# Patient Record
Sex: Male | Born: 1960 | Race: White | Hispanic: Yes | Smoking: Current every day smoker
Health system: Southern US, Community
[De-identification: ages and names within clinical notes are randomized; demographics above are authoritative.]

## PROBLEM LIST (undated history)

## (undated) DIAGNOSIS — E781 Pure hyperglyceridemia: Secondary | ICD-10-CM

## (undated) DIAGNOSIS — K219 Gastro-esophageal reflux disease without esophagitis: Secondary | ICD-10-CM

## (undated) DIAGNOSIS — I10 Essential (primary) hypertension: Secondary | ICD-10-CM

## (undated) DIAGNOSIS — N400 Enlarged prostate without lower urinary tract symptoms: Secondary | ICD-10-CM

## (undated) HISTORY — DX: Pure hyperglyceridemia: E78.1

## (undated) HISTORY — DX: Essential (primary) hypertension: I10

## (undated) HISTORY — PX: FINGER SURGERY: SHX640

---

## 2008-08-18 ENCOUNTER — Emergency Department (HOSPITAL_COMMUNITY): Admission: EM | Admit: 2008-08-18 | Discharge: 2008-08-18 | Payer: Self-pay | Admitting: Emergency Medicine

## 2008-12-10 ENCOUNTER — Emergency Department (HOSPITAL_COMMUNITY): Admission: EM | Admit: 2008-12-10 | Discharge: 2008-12-10 | Payer: Self-pay | Admitting: Emergency Medicine

## 2009-04-22 ENCOUNTER — Encounter: Admission: RE | Admit: 2009-04-22 | Discharge: 2009-04-22 | Payer: Self-pay | Admitting: Chiropractor

## 2009-09-24 ENCOUNTER — Emergency Department (HOSPITAL_COMMUNITY): Admission: EM | Admit: 2009-09-24 | Discharge: 2009-09-24 | Payer: Self-pay | Admitting: Emergency Medicine

## 2010-05-20 LAB — URINALYSIS, ROUTINE W REFLEX MICROSCOPIC
Bilirubin Urine: NEGATIVE
Nitrite: NEGATIVE
Specific Gravity, Urine: 1.022 (ref 1.005–1.030)
pH: 6 (ref 5.0–8.0)

## 2010-05-20 LAB — COMPREHENSIVE METABOLIC PANEL
AST: 21 U/L (ref 0–37)
BUN: 14 mg/dL (ref 6–23)
CO2: 24 mEq/L (ref 19–32)
Calcium: 9.2 mg/dL (ref 8.4–10.5)
Creatinine, Ser: 0.78 mg/dL (ref 0.4–1.5)
Glucose, Bld: 121 mg/dL — ABNORMAL HIGH (ref 70–99)
Potassium: 4 mEq/L (ref 3.5–5.1)
Sodium: 139 mEq/L (ref 135–145)

## 2010-05-20 LAB — OVA AND PARASITE EXAMINATION

## 2010-05-20 LAB — DIFFERENTIAL
Lymphocytes Relative: 39 % (ref 12–46)
Lymphs Abs: 3.8 10*3/uL (ref 0.7–4.0)
Monocytes Absolute: 1 10*3/uL (ref 0.1–1.0)
Monocytes Relative: 11 % (ref 3–12)
Neutro Abs: 4.6 10*3/uL (ref 1.7–7.7)
Neutrophils Relative %: 47 % (ref 43–77)

## 2010-05-20 LAB — CLOSTRIDIUM DIFFICILE EIA: C difficile Toxins A+B, EIA: NEGATIVE

## 2010-05-20 LAB — CBC
Hemoglobin: 15.8 g/dL (ref 13.0–17.0)
MCHC: 34.6 g/dL (ref 30.0–36.0)
RBC: 5.07 MIL/uL (ref 4.22–5.81)
WBC: 9.8 10*3/uL (ref 4.0–10.5)

## 2010-05-20 LAB — STOOL CULTURE

## 2010-06-08 LAB — COMPREHENSIVE METABOLIC PANEL
ALT: 51 U/L (ref 0–53)
AST: 28 U/L (ref 0–37)
Alkaline Phosphatase: 124 U/L — ABNORMAL HIGH (ref 39–117)
GFR calc non Af Amer: >60 mL/min (ref >60)
Potassium: 3.4 mEq/L — ABNORMAL LOW (ref 3.5–5.1)
Sodium: 136 mEq/L (ref 135–145)

## 2010-06-08 LAB — URINALYSIS, ROUTINE W REFLEX MICROSCOPIC
Bilirubin Urine: NEGATIVE
Hgb urine dipstick: NEGATIVE
Ketones, ur: NEGATIVE mg/dL
Protein, ur: NEGATIVE mg/dL
Urobilinogen, UA: 0.2 mg/dL (ref 0.0–1.0)
pH: 7 (ref 5.0–8.0)

## 2010-06-08 LAB — CBC
HCT: 46 % (ref 39.0–52.0)
MCHC: 34.9 g/dL (ref 30.0–36.0)
MCV: 88.9 fL (ref 78.0–100.0)

## 2010-06-08 LAB — GLUCOSE, CAPILLARY
Glucose-Capillary: 236 mg/dL — ABNORMAL HIGH (ref 70–99)
Glucose-Capillary: 241 mg/dL — ABNORMAL HIGH (ref 70–99)
Glucose-Capillary: 250 mg/dL — ABNORMAL HIGH (ref 70–99)
Glucose-Capillary: 465 mg/dL — ABNORMAL HIGH (ref 70–99)

## 2010-06-08 LAB — DIFFERENTIAL
Eosinophils Relative: 2 % (ref 0–5)
Lymphs Abs: 2.5 10*3/uL (ref 0.7–4.0)
Monocytes Absolute: 0.7 10*3/uL (ref 0.1–1.0)
Neutro Abs: 5.1 10*3/uL (ref 1.7–7.7)
Neutrophils Relative %: 60 % (ref 43–77)

## 2010-06-08 LAB — URINE MICROSCOPIC-ADD ON

## 2011-02-14 IMAGING — CR DG HAND COMPLETE 3+V*R*
3 series · 3 of 3 positions shown · non-contrast
Comparison: None

CLINICAL DATA: Laceration near metacarpal phalangeal joint of the
right index finger.

RIGHT HAND - COMPLETE 3+ VIEW

[view not recorded (1 of 3)]
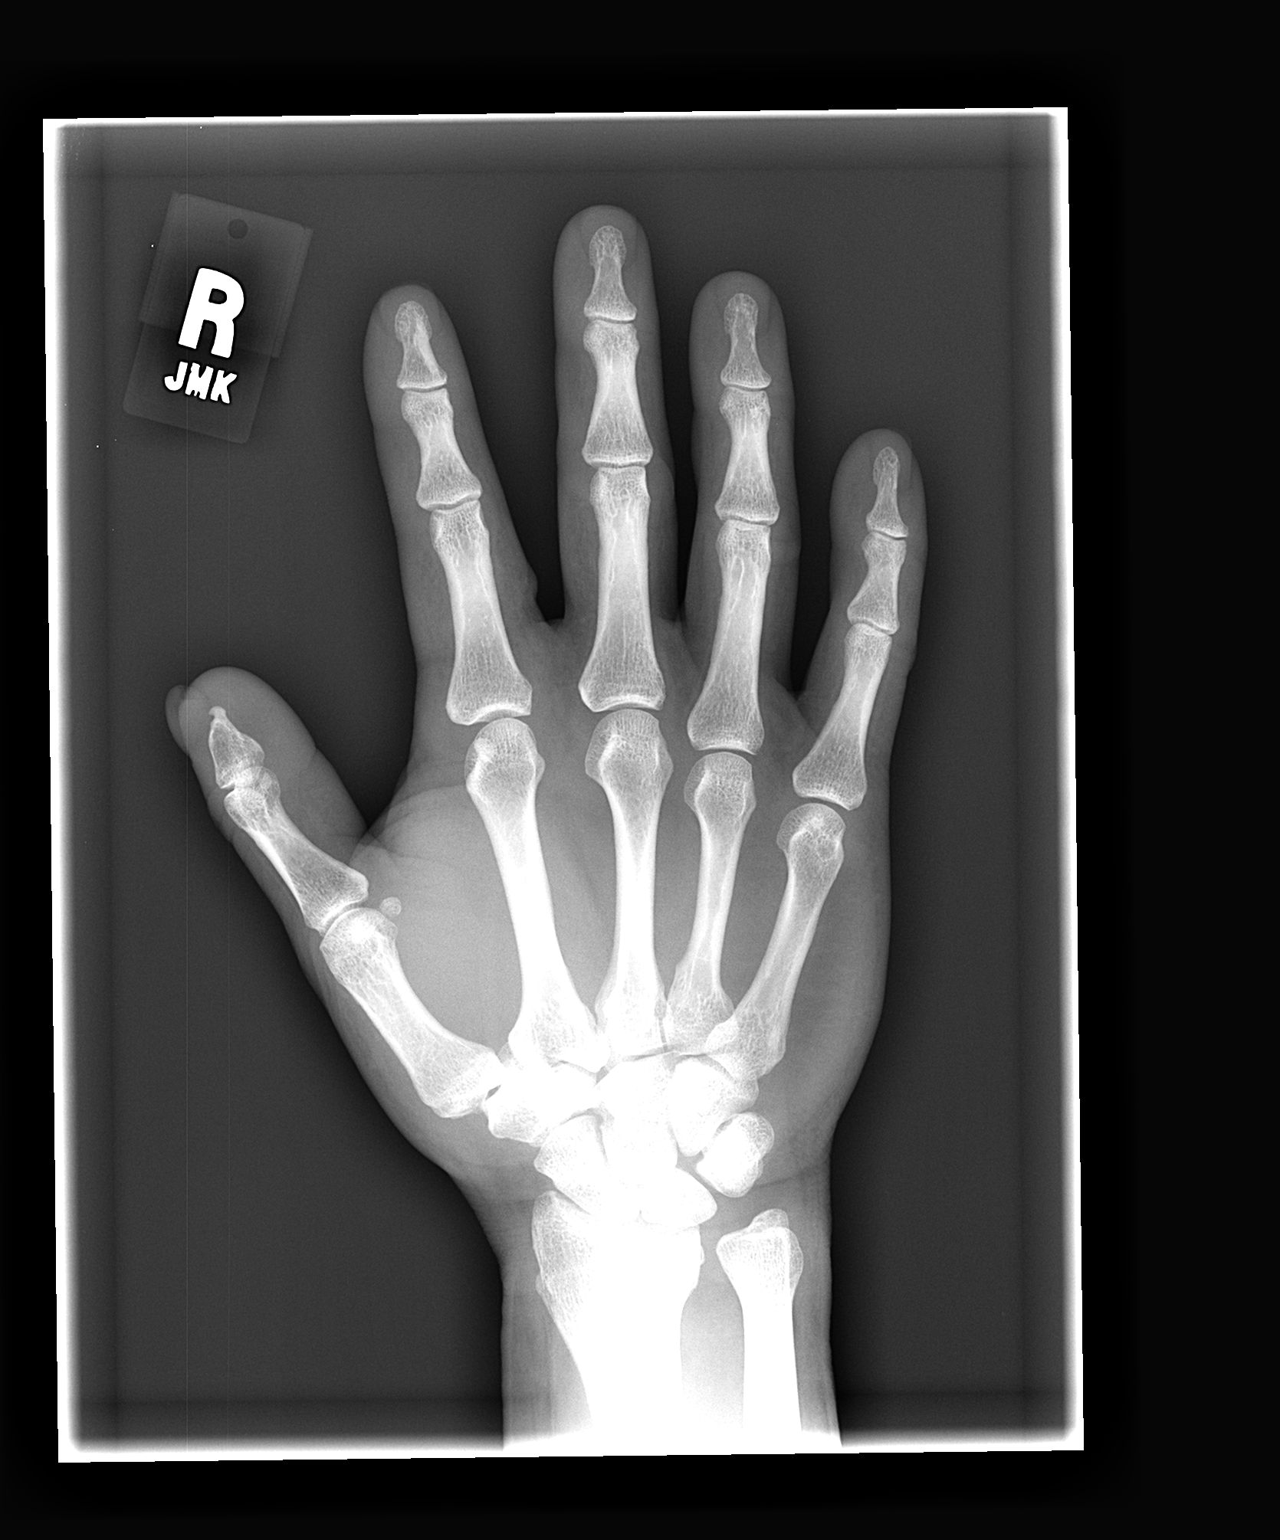

[view not recorded (2 of 3)]
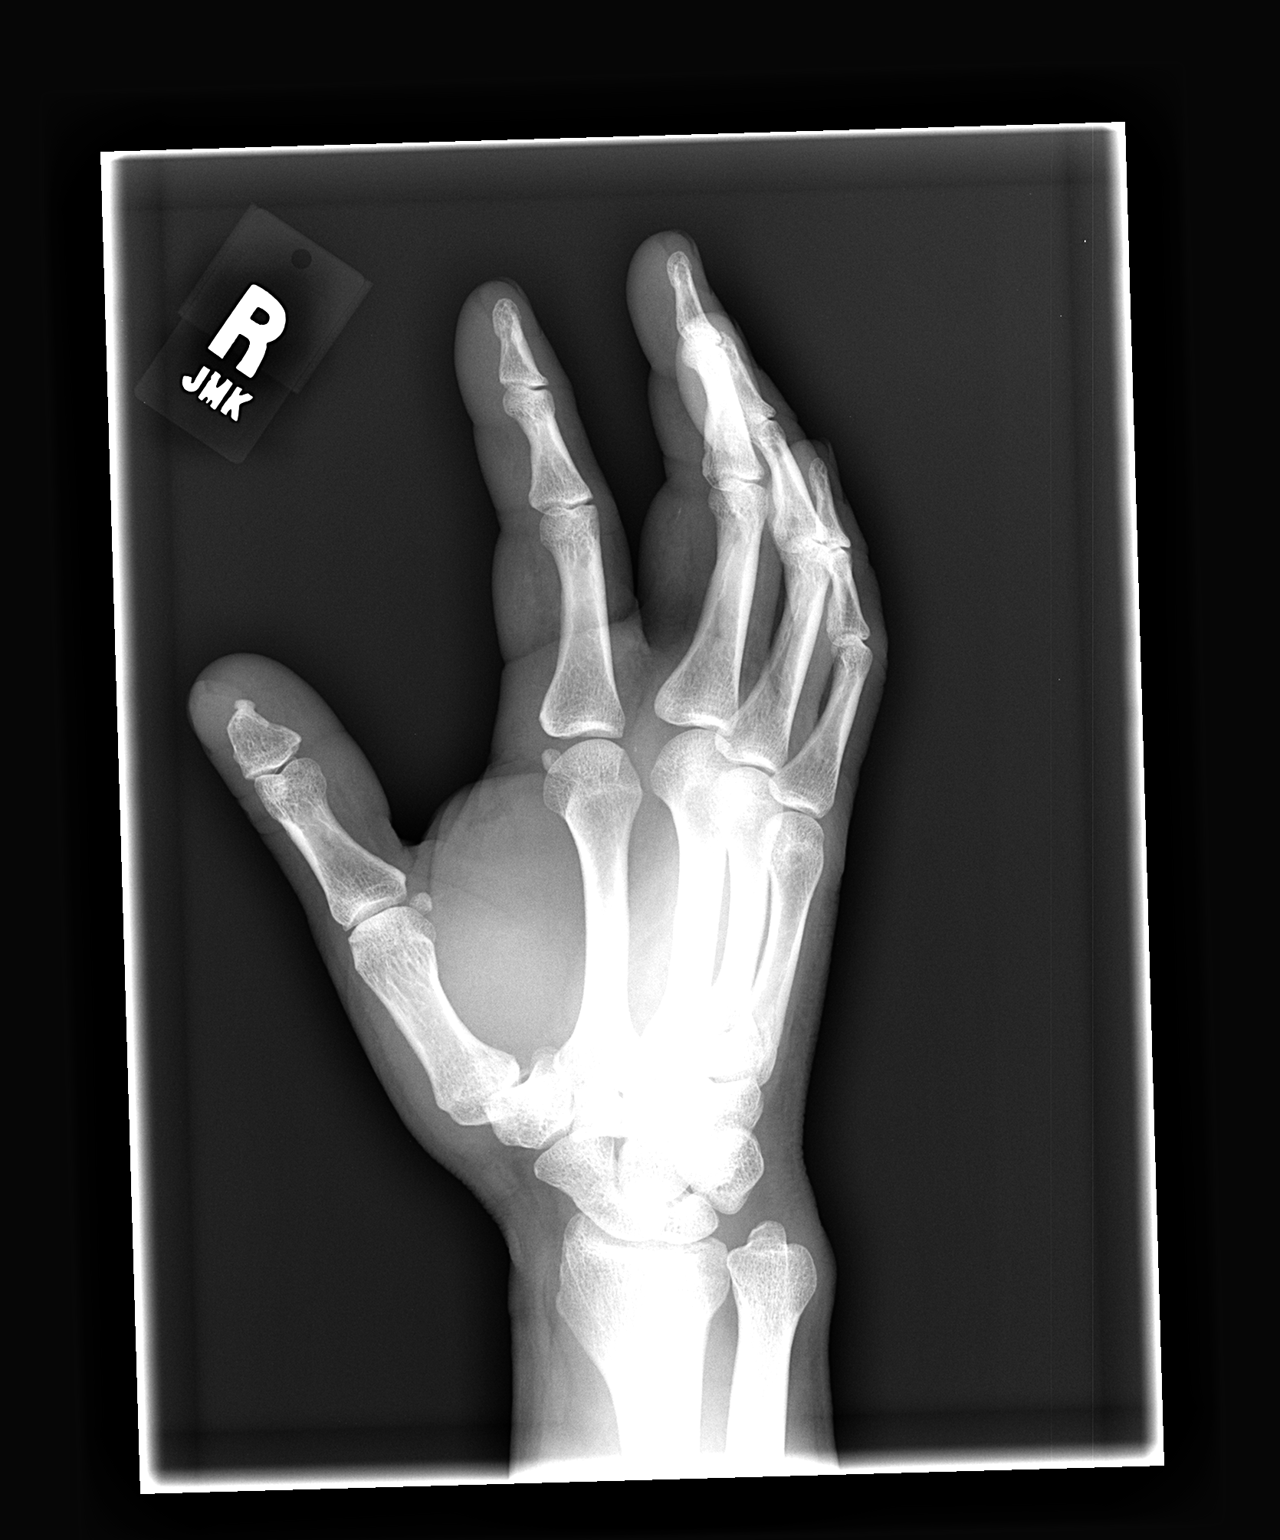

[view not recorded (3 of 3)]
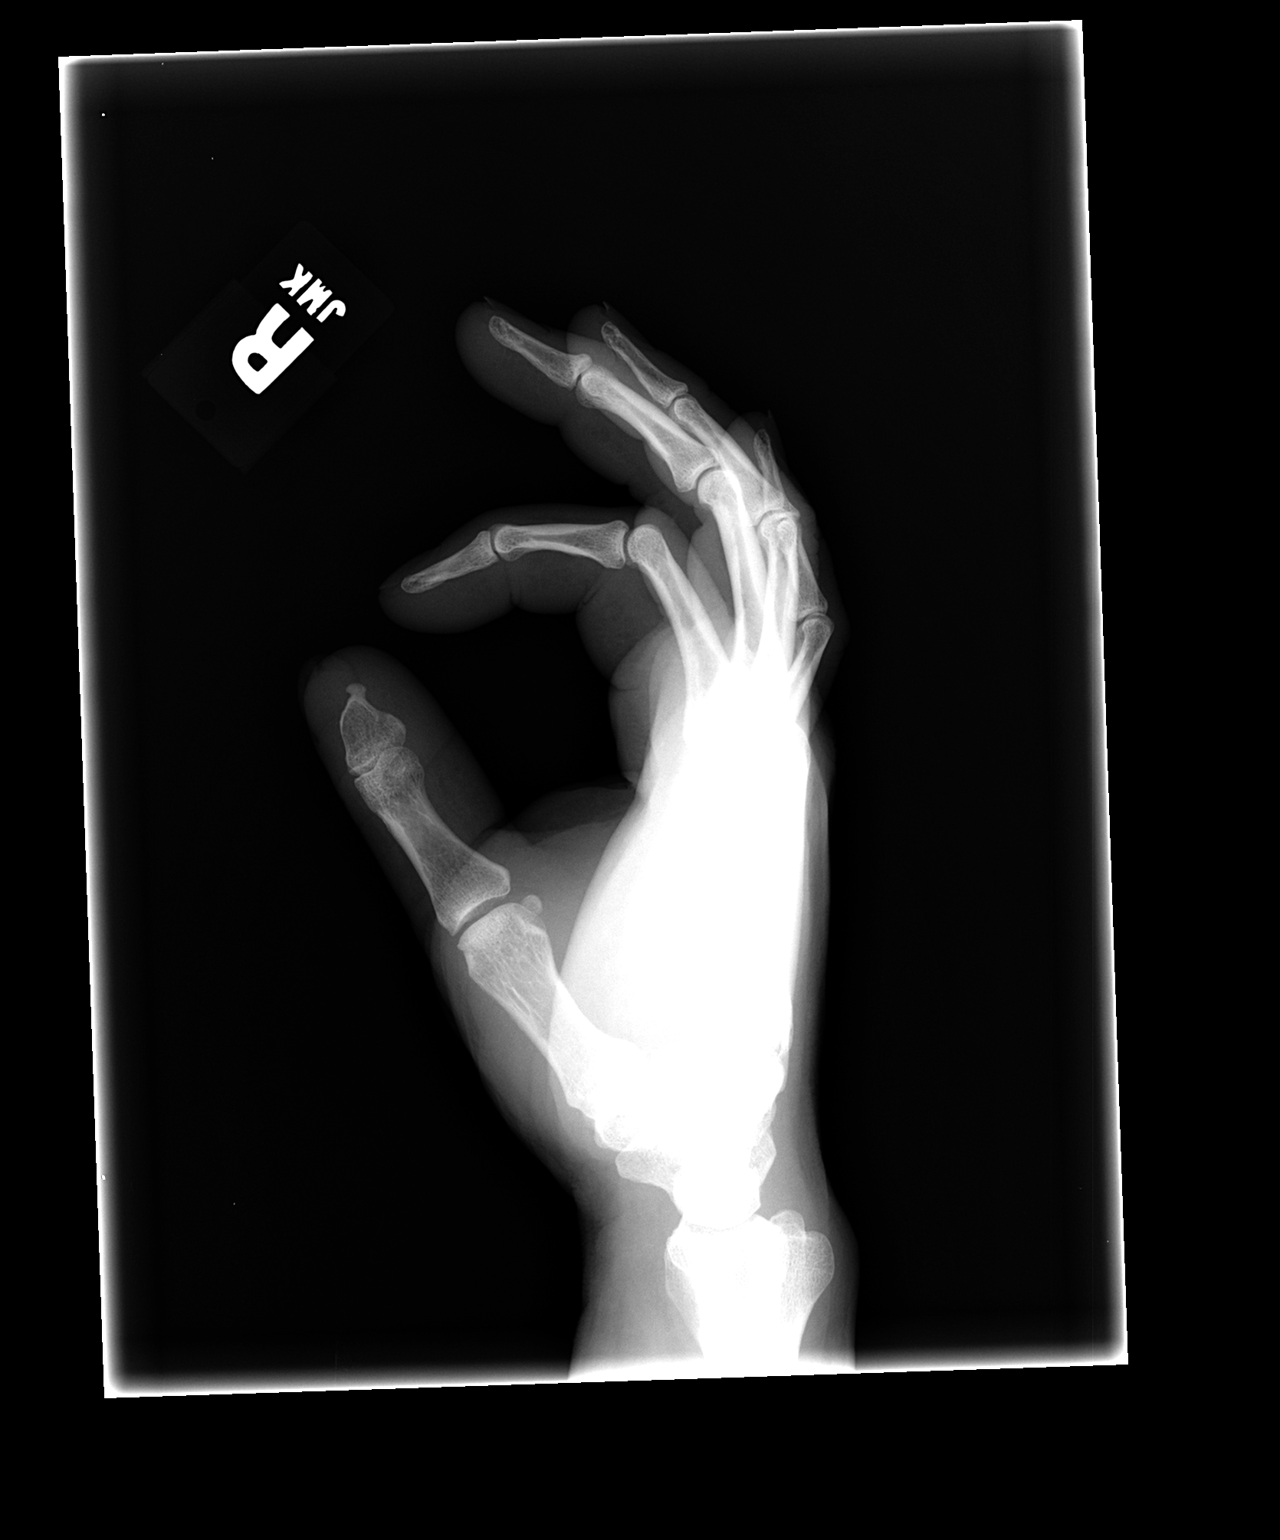

[3 of 3 positions shown; findings below may reference images not displayed]

FINDINGS: A soft tissue injury is seen at the base of the second
finger.  No underlying fracture or radiopaque foreign body.
Question previous injury to the first distal phalangeal tuft.
IMPRESSION: Soft tissue injury at the base of the second finger, without
underlying fracture or radiopaque foreign body.

## 2011-09-16 ENCOUNTER — Emergency Department (HOSPITAL_BASED_OUTPATIENT_CLINIC_OR_DEPARTMENT_OTHER)
Admission: EM | Admit: 2011-09-16 | Discharge: 2011-09-16 | Disposition: A | Discharge status: Home / Self Care | Payer: Self-pay | Attending: Emergency Medicine | Admitting: Emergency Medicine

## 2011-09-16 ENCOUNTER — Encounter (HOSPITAL_BASED_OUTPATIENT_CLINIC_OR_DEPARTMENT_OTHER): Payer: Self-pay | Admitting: *Deleted

## 2011-09-16 DIAGNOSIS — L255 Unspecified contact dermatitis due to plants, except food: Secondary | ICD-10-CM | POA: Insufficient documentation

## 2011-09-16 DIAGNOSIS — F17200 Nicotine dependence, unspecified, uncomplicated: Secondary | ICD-10-CM | POA: Insufficient documentation

## 2011-09-16 DIAGNOSIS — T622X1A Toxic effect of other ingested (parts of) plant(s), accidental (unintentional), initial encounter: Secondary | ICD-10-CM | POA: Insufficient documentation

## 2011-09-16 DIAGNOSIS — E119 Type 2 diabetes mellitus without complications: Secondary | ICD-10-CM | POA: Insufficient documentation

## 2011-09-16 DIAGNOSIS — L237 Allergic contact dermatitis due to plants, except food: Secondary | ICD-10-CM

## 2011-09-16 MED ORDER — PREDNISONE 10 MG PO TABS: ORAL_TABLET | ORAL | Status: DC

## 2011-09-16 NOTE — ED Provider Notes (Signed)
History     CSN: [id number]  Arrival date & time 09/16/11  2220   First MD Initiated Contact with Patient 09/16/11 2306      Chief Complaint  Patient presents with  . Rash    (Consider location/radiation/quality/duration/timing/severity/associated sxs/prior treatment) Patient is a 51 y.o. male presenting with rash. The history is provided by the patient. No language interpreter was used.  Rash  This is a new problem. The problem has been gradually worsening. The problem is associated with nothing. There has been no fever. The rash is present on the back, left arm and right arm. The pain is at a severity of 5/10. The pain is moderate. The pain has been constant since onset. Associated symptoms include blisters. He has tried nothing for the symptoms.   Pt ate shrimp last week.   Rash began the next day.  Pt has been in hotels, possible poison ivy exposure Past Medical History  Diagnosis Date  . Diabetes mellitus     History reviewed. No pertinent past surgical history.  History reviewed. No pertinent family history.  History  Substance Use Topics  . Smoking status: Current Everyday Smoker  . Smokeless tobacco: Not on file  . Alcohol Use: No      Review of Systems  Skin: Positive for rash.  All other systems reviewed and are negative.    Allergies  Review of patient's allergies indicates no known allergies.  Home Medications   Current Outpatient Rx  Name Route Sig Dispense Refill  . AMOXICILLIN PO Oral Take 1 capsule by mouth once.    . METFORMIN HCL 500 MG PO TABS Oral Take 500 mg by mouth 2 (two) times daily with a meal.    . PREDNISONE 10 MG PO TABS  6,6,5,5,4,4,3,3,2,2,1,1 42 tablet 0    BP 133/84  Pulse 74  Temp 99 F (37.2 C) (Oral)  Resp 16  SpO2 97%  Physical Exam  Nursing note and vitals reviewed. Constitutional: He is oriented to person, place, and time. He appears well-developed and well-nourished.  HENT:  Head: Normocephalic and atraumatic.    Eyes: Pupils are equal, round, and reactive to light.  Neck: Normal range of motion. Neck supple.  Cardiovascular: Normal rate.   Pulmonary/Chest: Effort normal.  Abdominal: Soft.  Musculoskeletal: Normal range of motion.  Neurological: He is alert and oriented to person, place, and time. He has normal reflexes.  Skin: Rash noted. There is erythema.       Rash is on arms, back and chest.   Psychiatric: He has a normal mood and affect.    ED Course  Procedures (including critical care time)  Labs Reviewed - No data to display No results found.   1. Poison ivy       MDM    I will treat with prednisone,  Pt advised to monitor glucose carefully      Elson Areas, [place] 09/16/11 2332

## 2011-09-16 NOTE — ED Notes (Signed)
Pt with rash to bilat arms and abd states that he ate shrimp on Wed and began feeling bad then developed diarrhea on Thursday and rash on Friday denies any new body products or laundry soaps

## 2011-09-16 NOTE — ED Notes (Signed)
Pt works out of town and stays in [place] while working

## 2011-09-17 NOTE — ED Provider Notes (Signed)
Medical screening examination/treatment/procedure(s) were performed by non-physician practitioner and as supervising physician I was immediately available for consultation/collaboration.   Hanley Seamen, MD 09/17/11 [phone]

## 2013-04-06 ENCOUNTER — Ambulatory Visit: Payer: Self-pay | Attending: Internal Medicine | Admitting: Internal Medicine

## 2013-04-06 ENCOUNTER — Encounter: Payer: Self-pay | Admitting: Internal Medicine

## 2013-04-06 VITALS — BP 138/81 | HR 65 | Temp 98.9°F | Resp 14 | Ht 68.0 in | Wt 165.6 lb

## 2013-04-06 DIAGNOSIS — R109 Unspecified abdominal pain: Secondary | ICD-10-CM | POA: Insufficient documentation

## 2013-04-06 DIAGNOSIS — E119 Type 2 diabetes mellitus without complications: Secondary | ICD-10-CM | POA: Insufficient documentation

## 2013-04-06 LAB — POCT GLYCOSYLATED HEMOGLOBIN (HGB A1C): Hemoglobin A1C: 9.8

## 2013-04-06 LAB — POCT URINALYSIS DIPSTICK
Bilirubin, UA: NEGATIVE
Blood, UA: NEGATIVE
Glucose, UA: 500
Ketones, UA: 15
LEUKOCYTES UA: NEGATIVE
Nitrite, UA: NEGATIVE
PH UA: 5.5
PROTEIN UA: 30
Spec Grav, UA: 1.02
UROBILINOGEN UA: 0.2

## 2013-04-06 LAB — CBC WITH DIFFERENTIAL/PLATELET
BASOS ABS: 0 10*3/uL (ref 0.0–0.1)
BASOS PCT: 1 % (ref 0–1)
EOS PCT: 3 % (ref 0–5)
Eosinophils Absolute: 0.2 10*3/uL (ref 0.0–0.7)
HCT: 46.1 % (ref 39.0–52.0)
Hemoglobin: 16.3 g/dL (ref 13.0–17.0)
LYMPHS PCT: 36 % (ref 12–46)
Lymphs Abs: 2.8 10*3/uL (ref 0.7–4.0)
MCH: 30 pg (ref 26.0–34.0)
MCHC: 35.4 g/dL (ref 30.0–36.0)
MCV: 84.7 fL (ref 78.0–100.0)
MONOS PCT: 10 % (ref 3–12)
Monocytes Absolute: 0.8 10*3/uL (ref 0.1–1.0)
NEUTROS ABS: 4.1 10*3/uL (ref 1.7–7.7)
NEUTROS PCT: 50 % (ref 43–77)
Platelets: 262 10*3/uL (ref 150–400)
RBC: 5.44 MIL/uL (ref 4.22–5.81)
RDW: 13.5 % (ref 11.5–15.5)
WBC: 7.9 10*3/uL (ref 4.0–10.5)

## 2013-04-06 LAB — COMPLETE METABOLIC PANEL WITH GFR
ALBUMIN: 4.7 g/dL (ref 3.5–5.2)
ALT: 28 U/L (ref 0–53)
AST: 19 U/L (ref 0–37)
Alkaline Phosphatase: 95 U/L (ref 39–117)
BILIRUBIN TOTAL: 0.8 mg/dL (ref 0.2–1.2)
BUN: 10 mg/dL (ref 6–23)
CALCIUM: 9.9 mg/dL (ref 8.4–10.5)
CHLORIDE: 100 meq/L (ref 96–112)
CO2: 26 meq/L (ref 19–32)
CREATININE: 0.75 mg/dL (ref 0.50–1.35)
GFR, Est African American: >89 mL/min
Glucose, Bld: 294 mg/dL — ABNORMAL HIGH (ref 70–99)
Potassium: 4 mEq/L (ref 3.5–5.3)
Sodium: 133 mEq/L — ABNORMAL LOW (ref 135–145)
Total Protein: 7.2 g/dL (ref 6.0–8.3)

## 2013-04-06 LAB — GLUCOSE, POCT (MANUAL RESULT ENTRY): POC GLUCOSE: 263 mg/dL — AB (ref 70–99)

## 2013-04-06 LAB — LIPID PANEL
Cholesterol: 143 mg/dL (ref 0–200)
HDL: 33 mg/dL — AB (ref >39)
LDL Cholesterol: 64 mg/dL (ref 0–99)
Total CHOL/HDL Ratio: 4.3 Ratio
Triglycerides: 228 mg/dL — ABNORMAL HIGH (ref <150)
VLDL: 46 mg/dL — ABNORMAL HIGH (ref 0–40)

## 2013-04-06 LAB — LIPASE: LIPASE: 21 U/L (ref 0–75)

## 2013-04-06 MED ORDER — METFORMIN HCL 500 MG PO TABS: 500.0000 mg | ORAL_TABLET | Freq: Two times a day (BID) | ORAL | Status: DC

## 2013-04-06 MED ORDER — GUAIFENESIN-CODEINE 100-10 MG/5ML PO SOLN: 5.0000 mL | Freq: Four times a day (QID) | ORAL | Status: DC | PRN

## 2013-04-06 NOTE — Progress Notes (Signed)
Pt is here to establish care and a primary provider. Pt is diabetic. Requests a check up. Complains of a sore throat x1 week  and aching pain in arms and knees. Pt has an interpreter.

## 2013-04-06 NOTE — Progress Notes (Signed)
Patient ID: Joseph Roberts, male   DOB: [birth year], 53 y.o.   MRN: [id number]   CC:  HPI: 53 year old male here to establish care, the patient was following with Dr. Lerry Linerwight Williams for his diabetes. Currently on metformin, unaware of his A1c, does not check his CBG on a regular basis, also complaining of a sore throat today, runny nose, no difficulty swallowing, no chest pain or shortness of breath. Patient also complaining of right upper quadrant pain. Similar pain in 2011, when he had a CT of the abdomen and pelvis that showed enteritis., No diarrhea no constipation no blood in stool  Patient smokes 4 cigarettes a day Nonalcoholic Family history strongly positive for diabetes in the mother the father   No Known Allergies Past Medical History  Diagnosis Date  . Diabetes mellitus    Current Outpatient Prescriptions on File Prior to Visit  Medication Sig Dispense Refill  . AMOXICILLIN PO Take 1 capsule by mouth once.      . predniSONE (DELTASONE) 10 MG tablet 6,6,5,5,4,4,3,3,2,2,1,1  42 tablet  0   No current facility-administered medications on file prior to visit.   Family History  Problem Relation Age of Onset  . Diabetes Mother   . Diabetes Sister   . Diabetes Brother    History   Social History  . Marital Status: Married    Spouse Name: N/A    Number of Children: N/A  . Years of Education: N/A   Occupational History  . Not on file.   Social History Main Topics  . Smoking status: Current Every Day Smoker  . Smokeless tobacco: Not on file  . Alcohol Use: No  . Drug Use: No  . Sexual Activity:    Other Topics Concern  . Not on file   Social History Narrative  . No narrative on file    Review of Systems  Constitutional: As in history of present illness  HENT: Negative for ear pain, nosebleeds, congestion, facial swelling, rhinorrhea, neck pain, neck stiffness and ear discharge.   Eyes: Negative for pain, discharge, redness, itching and visual  disturbance.  Respiratory: Negative for cough, choking, chest tightness, shortness of breath, wheezing and stridor.   Cardiovascular: Negative for chest pain, palpitations and leg swelling.  Gastrointestinal: As in history of present illness.  Genitourinary: Negative for dysuria, urgency, frequency, hematuria, flank pain, decreased urine volume, difficulty urinating and dyspareunia.  Musculoskeletal: Negative for back pain, joint swelling, arthralgias and gait problem.  Neurological: Negative for dizziness, tremors, seizures, syncope, facial asymmetry, speech difficulty, weakness, light-headedness, numbness and headaches.  Hematological: Negative for adenopathy. Does not bruise/bleed easily.  Psychiatric/Behavioral: Negative for hallucinations, behavioral problems, confusion, dysphoric mood, decreased concentration and agitation.    Objective:   Filed Vitals:   04/06/13 1415  BP: 138/81  Pulse: 65  Temp: 98.9 F (37.2 C)  Resp: 14    Physical Exam  Constitutional: Appears well-developed and well-nourished. No distress.  HENT: Normocephalic. External right and left ear normal. Oropharynx is clear and moist.  Eyes: Conjunctivae and EOM are normal. PERRLA, no scleral icterus.  Neck: Normal ROM. Neck supple. No JVD. No tracheal deviation. No thyromegaly.  CVS: RRR, S1/S2 +, no murmurs, no gallops, no carotid bruit.  Pulmonary: Effort and breath sounds normal, no stridor, rhonchi, wheezes, rales.  Abdominal: Soft. BS +,  no distension, tenderness, rebound or guarding.  Musculoskeletal: Normal range of motion. No edema and no tenderness.  Lymphadenopathy: No lymphadenopathy noted, cervical, inguinal. Neuro: Alert. Normal reflexes, muscle tone  coordination. No cranial nerve deficit. Skin: Skin is warm and dry. No rash noted. Not diaphoretic. No erythema. No pallor.  Psychiatric: Normal mood and affect. Behavior, judgment, thought content normal.   Lab Results  Component Value Date    WBC 9.8 09/24/2009   HGB 15.8 09/24/2009   HCT 45.7 09/24/2009   MCV 90.0 09/24/2009   PLT 187 09/24/2009   Lab Results  Component Value Date   CREATININE 0.78 09/24/2009   BUN 14 09/24/2009   NA 139 09/24/2009   K 4.0 09/24/2009   CL 110 09/24/2009   CO2 24 09/24/2009    No results found for this basename: HGBA1C   Lipid Panel  No results found for this basename: chol, trig, hdl, cholhdl, vldl, ldlcalc       Assessment and plan:   There are no active problems to display for this patient.  Abdominal pain Right upper quadrant No history of gallstones or nephrolithiasis We'll obtain a right upper quadrant ultrasound CMP, lipase, CBC Given history of enteritis, patient should also have a colonoscopy Gastroenterology referral   Diabetes Check A1c Continue metformin Ophthalmology referral  establish care Obtain baseline labs Gastroenterology referral for a colonoscopy    Followup in 3 months         The patient was given clear instructions to go to ER or return to medical center if symptoms don't improve, worsen or new problems develop. The patient verbalized understanding. The patient was told to call to get any lab results if not heard anything in the next week.

## 2013-04-07 ENCOUNTER — Telehealth: Payer: Self-pay | Admitting: *Deleted

## 2013-04-07 LAB — VITAMIN D 25 HYDROXY (VIT D DEFICIENCY, FRACTURES): VIT D 25 HYDROXY: 16 ng/mL — AB (ref 30–89)

## 2013-04-07 LAB — TSH: TSH: 1.976 u[IU]/mL (ref 0.350–4.500)

## 2013-04-07 MED ORDER — VITAMIN D (ERGOCALCIFEROL) 1.25 MG (50000 UNIT) PO CAPS: 50000.0000 [IU] | ORAL_CAPSULE | ORAL | Status: DC

## 2013-04-07 NOTE — Telephone Encounter (Signed)
Contacted pt with an interpreter. Prescription was sent to our pharmacy.

## 2013-04-07 NOTE — Telephone Encounter (Signed)
Message copied by Willem Klingensmith, [place] on Tue Apr 07, 2013  9:48 AM ------      Message from: Susie CassetteABROL MD, Germain OsgoodNAYANA      Created: Tue Apr 07, 2013  9:14 AM       Notify patient that vitamin D level is 16, rest of the labs are normal, following a prescription for vitamin D 50,000 units weekly, 10 tablets 0 refills ------

## 2013-04-10 ENCOUNTER — Ambulatory Visit (HOSPITAL_COMMUNITY)
Admission: RE | Admit: 2013-04-10 | Discharge: 2013-04-10 | Disposition: A | Discharge status: Home / Self Care | Payer: Self-pay | Source: Ambulatory Visit | Attending: Internal Medicine | Admitting: Internal Medicine

## 2013-04-10 DIAGNOSIS — E119 Type 2 diabetes mellitus without complications: Secondary | ICD-10-CM | POA: Insufficient documentation

## 2013-04-10 DIAGNOSIS — R109 Unspecified abdominal pain: Secondary | ICD-10-CM | POA: Insufficient documentation

## 2013-04-10 NOTE — Addendum Note (Signed)
Addended by: Susie CassetteABROL MD, Germain OsgoodNAYANA on: 04/10/2013 10:39 AM   Modules accepted: Orders

## 2013-04-22 ENCOUNTER — Emergency Department (INDEPENDENT_AMBULATORY_CARE_PROVIDER_SITE_OTHER)
Admission: EM | Admit: 2013-04-22 | Discharge: 2013-04-22 | Disposition: A | Discharge status: Home / Self Care | Payer: Self-pay | Source: Home / Self Care | Attending: Family Medicine | Admitting: Family Medicine

## 2013-04-22 ENCOUNTER — Encounter (HOSPITAL_COMMUNITY): Payer: Self-pay | Admitting: Emergency Medicine

## 2013-04-22 DIAGNOSIS — E119 Type 2 diabetes mellitus without complications: Secondary | ICD-10-CM

## 2013-04-22 LAB — POCT I-STAT, CHEM 8
BUN: 15 mg/dL (ref 6–23)
CALCIUM ION: 1.26 mmol/L — AB (ref 1.12–1.23)
Chloride: 99 mEq/L (ref 96–112)
Creatinine, Ser: 0.8 mg/dL (ref 0.50–1.35)
Glucose, Bld: 305 mg/dL — ABNORMAL HIGH (ref 70–99)
HEMATOCRIT: 58 % — AB (ref 39.0–52.0)
HEMOGLOBIN: 19.7 g/dL — AB (ref 13.0–17.0)
Potassium: 4.2 mEq/L (ref 3.7–5.3)
SODIUM: 136 meq/L — AB (ref 137–147)
TCO2: 23 mmol/L (ref 0–100)

## 2013-04-22 MED ORDER — METFORMIN HCL 1000 MG PO TABS: 1000.0000 mg | ORAL_TABLET | Freq: Two times a day (BID) | ORAL | Status: DC

## 2013-04-22 MED ORDER — GLIPIZIDE 5 MG PO TABS: 5.0000 mg | ORAL_TABLET | Freq: Two times a day (BID) | ORAL | Status: DC

## 2013-04-22 NOTE — ED Notes (Signed)
C/ohis vision is "off" and his DM is not controlled

## 2013-04-22 NOTE — ED Provider Notes (Signed)
CSN: [id number]     Arrival date & time 04/22/13  1601 History   First MD Initiated Contact with Patient 04/22/13 1757     Chief Complaint  Patient presents with  . Diabetes     (Consider location/radiation/quality/duration/timing/severity/associated sxs/prior Treatment) Patient is a 53 y.o. male presenting with diabetes problem. The history is provided by the patient. The history is limited by a language barrier. A language interpreter was used (cma -ramon translated).  Diabetes This is a chronic problem. The current episode started more than 1 week ago (his sugar has been high for 2 wks.). The problem has been gradually worsening. Pertinent negatives include no chest pain and no shortness of breath. Associated symptoms comments: Visual changes, polyuria..    Past Medical History  Diagnosis Date  . Diabetes mellitus    History reviewed. No pertinent past surgical history. Family History  Problem Relation Age of Onset  . Diabetes Mother   . Diabetes Sister   . Diabetes Brother    History  Substance Use Topics  . Smoking status: Current Every Day Smoker  . Smokeless tobacco: Not on file  . Alcohol Use: No    Review of Systems  Constitutional: Negative.   Eyes: Positive for visual disturbance.  Respiratory: Negative for shortness of breath.   Cardiovascular: Negative for chest pain.  Endocrine: Positive for polydipsia and polyuria.      Allergies  Review of patient's allergies indicates no known allergies.  Home Medications   Current Outpatient Rx  Name  Route  Sig  Dispense  Refill  . AMOXICILLIN PO   Oral   Take 1 capsule by mouth once.         Marland Kitchen glipiZIDE (GLUCOTROL) 5 MG tablet   Oral   Take 1 tablet (5 mg total) by mouth 2 (two) times daily before a meal.   60 tablet   1   . guaiFENesin-codeine 100-10 MG/5ML syrup   Oral   Take 5 mLs by mouth every 6 (six) hours as needed for cough.   480 mL   0   . metFORMIN (GLUCOPHAGE) 1000 MG tablet   Oral   Take 1 tablet (1,000 mg total) by mouth 2 (two) times daily.   60 tablet   1   . metFORMIN (GLUCOPHAGE) 500 MG tablet   Oral   Take 1 tablet (500 mg total) by mouth 2 (two) times daily with a meal.   60 tablet   2   . predniSONE (DELTASONE) 10 MG tablet      6,6,5,5,4,4,3,3,2,2,1,1   42 tablet   0   . Vitamin D, Ergocalciferol, (DRISDOL) 50000 UNITS CAPS capsule   Oral   Take 1 capsule (50,000 Units total) by mouth every 7 (seven) days.   10 capsule   0    BP 131/83  Pulse 69  Temp(Src) 98.7 F (37.1 C) (Oral)  Resp 16  SpO2 99% Physical Exam  Nursing note and vitals reviewed. Constitutional: He is oriented to person, place, and time. He appears well-developed and well-nourished. No distress.  HENT:  Mouth/Throat: Oropharynx is clear and moist.  Eyes: EOM are normal. Pupils are equal, round, and reactive to light.  Neck: Normal range of motion. Neck supple.  Pulmonary/Chest: Breath sounds normal.  Lymphadenopathy:    He has no cervical adenopathy.  Neurological: He is alert and oriented to person, place, and time.  Skin: Skin is warm and dry.    ED Course  Procedures (including critical care time) Labs  Review Labs Reviewed  POCT I-STAT, CHEM 8 - Abnormal; Notable for the following:    Sodium 136 (*)    Glucose, Bld 305 (*)    Calcium, Ion 1.26 (*)    Hemoglobin 19.7 (*)    HCT 58.0 (*)    All other components within normal limits   Imaging Review No results found. i-stat glu 305   MDM   Final diagnoses:  Diabetes mellitus       Linna HoffJames D Mitsuo Budnick, MD 04/22/13 [phone]

## 2013-04-22 NOTE — Discharge Instructions (Signed)
Take new medicine as prescribed , see your doctor in 2 weeks for recheck.

## 2013-06-04 ENCOUNTER — Ambulatory Visit: Payer: Self-pay | Admitting: Internal Medicine

## 2013-07-17 ENCOUNTER — Ambulatory Visit: Payer: Self-pay | Admitting: Internal Medicine

## 2013-10-23 ENCOUNTER — Inpatient Hospital Stay (HOSPITAL_COMMUNITY)
Admission: EM | Admit: 2013-10-23 | Discharge: 2013-10-24 | DRG: 394 | Disposition: A | Discharge status: Home / Self Care | Payer: 59 | Attending: Internal Medicine | Admitting: Internal Medicine

## 2013-10-23 ENCOUNTER — Inpatient Hospital Stay (HOSPITAL_COMMUNITY): Payer: 59

## 2013-10-23 ENCOUNTER — Emergency Department (HOSPITAL_COMMUNITY): Payer: 59

## 2013-10-23 ENCOUNTER — Encounter (HOSPITAL_COMMUNITY): Payer: Self-pay | Admitting: Emergency Medicine

## 2013-10-23 DIAGNOSIS — Z794 Long term (current) use of insulin: Secondary | ICD-10-CM

## 2013-10-23 DIAGNOSIS — K824 Cholesterolosis of gallbladder: Secondary | ICD-10-CM | POA: Diagnosis present

## 2013-10-23 DIAGNOSIS — IMO0001 Reserved for inherently not codable concepts without codable children: Secondary | ICD-10-CM | POA: Diagnosis present

## 2013-10-23 DIAGNOSIS — K639 Disease of intestine, unspecified: Principal | ICD-10-CM | POA: Diagnosis present

## 2013-10-23 DIAGNOSIS — Z23 Encounter for immunization: Secondary | ICD-10-CM | POA: Diagnosis not present

## 2013-10-23 DIAGNOSIS — K219 Gastro-esophageal reflux disease without esophagitis: Secondary | ICD-10-CM | POA: Diagnosis present

## 2013-10-23 DIAGNOSIS — E872 Acidosis, unspecified: Secondary | ICD-10-CM | POA: Diagnosis present

## 2013-10-23 DIAGNOSIS — K5730 Diverticulosis of large intestine without perforation or abscess without bleeding: Secondary | ICD-10-CM | POA: Diagnosis present

## 2013-10-23 DIAGNOSIS — K648 Other hemorrhoids: Secondary | ICD-10-CM | POA: Diagnosis present

## 2013-10-23 DIAGNOSIS — F17200 Nicotine dependence, unspecified, uncomplicated: Secondary | ICD-10-CM | POA: Diagnosis present

## 2013-10-23 DIAGNOSIS — E119 Type 2 diabetes mellitus without complications: Secondary | ICD-10-CM | POA: Diagnosis present

## 2013-10-23 DIAGNOSIS — J029 Acute pharyngitis, unspecified: Secondary | ICD-10-CM | POA: Diagnosis present

## 2013-10-23 DIAGNOSIS — R5381 Other malaise: Secondary | ICD-10-CM | POA: Diagnosis present

## 2013-10-23 DIAGNOSIS — R109 Unspecified abdominal pain: Secondary | ICD-10-CM

## 2013-10-23 DIAGNOSIS — R933 Abnormal findings on diagnostic imaging of other parts of digestive tract: Secondary | ICD-10-CM | POA: Diagnosis present

## 2013-10-23 DIAGNOSIS — R635 Abnormal weight gain: Secondary | ICD-10-CM | POA: Diagnosis present

## 2013-10-23 DIAGNOSIS — K6389 Other specified diseases of intestine: Secondary | ICD-10-CM | POA: Diagnosis present

## 2013-10-23 DIAGNOSIS — R5383 Other fatigue: Secondary | ICD-10-CM | POA: Diagnosis present

## 2013-10-23 DIAGNOSIS — R1011 Right upper quadrant pain: Secondary | ICD-10-CM | POA: Diagnosis present

## 2013-10-23 DIAGNOSIS — E1165 Type 2 diabetes mellitus with hyperglycemia: Secondary | ICD-10-CM

## 2013-10-23 LAB — URINALYSIS, ROUTINE W REFLEX MICROSCOPIC
Bilirubin Urine: NEGATIVE
GLUCOSE, UA: NEGATIVE mg/dL
HGB URINE DIPSTICK: NEGATIVE
KETONES UR: NEGATIVE mg/dL
Leukocytes, UA: NEGATIVE
Nitrite: NEGATIVE
PROTEIN: 30 mg/dL — AB
Specific Gravity, Urine: 1.016 (ref 1.005–1.030)
Urobilinogen, UA: 0.2 mg/dL (ref 0.0–1.0)
pH: 5 (ref 5.0–8.0)

## 2013-10-23 LAB — COMPREHENSIVE METABOLIC PANEL
ALT: 28 U/L (ref 0–53)
AST: 28 U/L (ref 0–37)
Albumin: 3.8 g/dL (ref 3.5–5.2)
Alkaline Phosphatase: 99 U/L (ref 39–117)
Anion gap: 14 (ref 5–15)
BUN: 10 mg/dL (ref 6–23)
CALCIUM: 9.4 mg/dL (ref 8.4–10.5)
CO2: 18 mEq/L — ABNORMAL LOW (ref 19–32)
Chloride: 104 mEq/L (ref 96–112)
Creatinine, Ser: 0.59 mg/dL (ref 0.50–1.35)
GFR calc Af Amer: >90 mL/min (ref >90)
Glucose, Bld: 152 mg/dL — ABNORMAL HIGH (ref 70–99)
Potassium: 4.4 mEq/L (ref 3.7–5.3)
Sodium: 136 mEq/L — ABNORMAL LOW (ref 137–147)
Total Bilirubin: 0.5 mg/dL (ref 0.3–1.2)
Total Protein: 7.7 g/dL (ref 6.0–8.3)

## 2013-10-23 LAB — CBC WITH DIFFERENTIAL/PLATELET
Basophils Absolute: 0.1 10*3/uL (ref 0.0–0.1)
Basophils Relative: 1 % (ref 0–1)
EOS ABS: 0.3 10*3/uL (ref 0.0–0.7)
EOS PCT: 3 % (ref 0–5)
HCT: 48.2 % (ref 39.0–52.0)
HEMOGLOBIN: 16.9 g/dL (ref 13.0–17.0)
LYMPHS ABS: 2.9 10*3/uL (ref 0.7–4.0)
Lymphocytes Relative: 29 % (ref 12–46)
MCH: 30.8 pg (ref 26.0–34.0)
MCHC: 35.1 g/dL (ref 30.0–36.0)
MCV: 88 fL (ref 78.0–100.0)
MONOS PCT: 6 % (ref 3–12)
Monocytes Absolute: 0.6 10*3/uL (ref 0.1–1.0)
Neutro Abs: 6.2 10*3/uL (ref 1.7–7.7)
Neutrophils Relative %: 61 % (ref 43–77)
PLATELETS: 191 10*3/uL (ref 150–400)
RBC: 5.48 MIL/uL (ref 4.22–5.81)
RDW: 13.2 % (ref 11.5–15.5)
WBC: 10 10*3/uL (ref 4.0–10.5)

## 2013-10-23 LAB — URINE MICROSCOPIC-ADD ON

## 2013-10-23 LAB — LIPASE, BLOOD: Lipase: 46 U/L (ref 11–59)

## 2013-10-23 LAB — GLUCOSE, CAPILLARY
GLUCOSE-CAPILLARY: 144 mg/dL — AB (ref 70–99)
Glucose-Capillary: 117 mg/dL — ABNORMAL HIGH (ref 70–99)
Glucose-Capillary: 133 mg/dL — ABNORMAL HIGH (ref 70–99)

## 2013-10-23 LAB — TROPONIN I

## 2013-10-23 MED ORDER — FAMOTIDINE IN NACL 20-0.9 MG/50ML-% IV SOLN
20.0000 mg | Freq: Once | INTRAVENOUS | Status: AC
  Administered 2013-10-23: 20 mg via INTRAVENOUS
  Filled 2013-10-23: qty 50

## 2013-10-23 MED ORDER — ACETAMINOPHEN 325 MG PO TABS
650.0000 mg | ORAL_TABLET | Freq: Four times a day (QID) | ORAL | Status: DC | PRN
Start: 2013-10-23 — End: 2013-10-23

## 2013-10-23 MED ORDER — SODIUM CHLORIDE 0.9 % IV SOLN: INTRAVENOUS | Status: DC

## 2013-10-23 MED ORDER — IOHEXOL 300 MG/ML  SOLN
80.0000 mL | Freq: Once | INTRAMUSCULAR | Status: AC | PRN
  Administered 2013-10-23: 80 mL via INTRAVENOUS

## 2013-10-23 MED ORDER — PNEUMOCOCCAL VAC POLYVALENT 25 MCG/0.5ML IJ INJ
0.5000 mL | INJECTION | INTRAMUSCULAR | Status: AC
  Administered 2013-10-24: 0.5 mL via INTRAMUSCULAR
  Filled 2013-10-23: qty 0.5

## 2013-10-23 MED ORDER — SODIUM CHLORIDE 0.9 % IV SOLN
INTRAVENOUS | Status: AC
  Administered 2013-10-23 (×2): via INTRAVENOUS

## 2013-10-23 MED ORDER — ONDANSETRON HCL 4 MG/2ML IJ SOLN
4.0000 mg | INTRAMUSCULAR | Status: DC | PRN
  Administered 2013-10-23: 4 mg via INTRAVENOUS
  Filled 2013-10-23: qty 2

## 2013-10-23 MED ORDER — ACETAMINOPHEN 650 MG RE SUPP: 650.0000 mg | Freq: Four times a day (QID) | RECTAL | Status: DC | PRN

## 2013-10-23 MED ORDER — INSULIN ASPART 100 UNIT/ML ~~LOC~~ SOLN
0.0000 [IU] | SUBCUTANEOUS | Status: DC
  Administered 2013-10-23 – 2013-10-24 (×3): 1 [IU] via SUBCUTANEOUS

## 2013-10-23 MED ORDER — HYDROMORPHONE HCL PF 1 MG/ML IJ SOLN: 0.5000 mg | INTRAMUSCULAR | Status: DC | PRN

## 2013-10-23 MED ORDER — ENOXAPARIN SODIUM 30 MG/0.3ML ~~LOC~~ SOLN: 30.0000 mg | Freq: Two times a day (BID) | SUBCUTANEOUS | Status: DC

## 2013-10-23 MED ORDER — ACETAMINOPHEN 325 MG PO TABS
650.0000 mg | ORAL_TABLET | Freq: Once | ORAL | Status: AC
  Administered 2013-10-23: 650 mg via ORAL
  Filled 2013-10-23: qty 2

## 2013-10-23 MED ORDER — INSULIN GLARGINE 100 UNIT/ML ~~LOC~~ SOLN
10.0000 [IU] | Freq: Every day | SUBCUTANEOUS | Status: DC
  Administered 2013-10-23: 10 [IU] via SUBCUTANEOUS
  Filled 2013-10-23 (×2): qty 0.1

## 2013-10-23 MED ORDER — MORPHINE SULFATE 2 MG/ML IJ SOLN
2.0000 mg | INTRAMUSCULAR | Status: AC | PRN
  Administered 2013-10-23 (×2): 2 mg via INTRAVENOUS
  Filled 2013-10-23 (×2): qty 1

## 2013-10-23 MED ORDER — IOHEXOL 300 MG/ML  SOLN
25.0000 mL | Freq: Once | INTRAMUSCULAR | Status: AC | PRN
  Administered 2013-10-23: 25 mL via ORAL

## 2013-10-23 MED ORDER — MORPHINE SULFATE 2 MG/ML IJ SOLN: 2.0000 mg | INTRAMUSCULAR | Status: DC | PRN

## 2013-10-23 MED ORDER — ENOXAPARIN SODIUM 40 MG/0.4ML ~~LOC~~ SOLN: 40.0000 mg | SUBCUTANEOUS | Status: DC

## 2013-10-23 MED ORDER — PEG 3350-KCL-NA BICARB-NACL 420 G PO SOLR
4000.0000 mL | Freq: Once | ORAL | Status: AC
  Administered 2013-10-23: 4000 mL via ORAL
  Filled 2013-10-23: qty 4000

## 2013-10-23 MED ORDER — ENOXAPARIN SODIUM 40 MG/0.4ML ~~LOC~~ SOLN
40.0000 mg | SUBCUTANEOUS | Status: DC
  Administered 2013-10-23: 40 mg via SUBCUTANEOUS
  Filled 2013-10-23: qty 0.4

## 2013-10-23 MED ORDER — ONDANSETRON HCL 4 MG/2ML IJ SOLN: 4.0000 mg | Freq: Three times a day (TID) | INTRAMUSCULAR | Status: AC | PRN

## 2013-10-23 NOTE — ED Notes (Signed)
Pt here with upper abdominal pain that began while at work between 3 and 5am.  Pt has nausea and belching with this but no vomiting and no fever.  Last BM yesterday.  No problems with urination.

## 2013-10-23 NOTE — H&P (Signed)
Date: 10/23/2013               Patient Name:  Joseph Roberts MRN: [id number]  DOB: Jul 14, 1960 Age / Sex: 53 y.o., male   PCP: Jeanann Lewandowskylugbemiga Jegede, MD              Medical Service: Internal Medicine Teaching Service              Attending Physician: Dr. Rocco SereneLawrence D Klima, MD    First Contact: Dr. Mitzie NaEverett Averyanna Sax Pager: [phone]  Second Contact: Dr. Dow Adolphichard Kazibwe Pager: [phone]            After Hours (After 5p/  First Contact Pager: [phone]  weekends / holidays): Second Contact Pager: [phone]   Chief Complaint:  Abdominal pain  History of Present Illness: Joseph Roberts is a 53 year old male with PMH of DM and gallbladder polyps who presents with severe abdominal pain and nausea.  He describes the pain as severe, constant RUQ abdominal and back pain that started at 3 am this morning, and he was unable to complete his shift at work. He reports the pain is worse after he eats spicy foods with no alleviating factors since the start of the severe pain. Last bowel movement was around 2 am this morning and was unchanged from prior bowel movements. He reports his bowel movements as soft and non-bloody without any changes in size or consistency. Describes an increase in fatigue over the past 2-3 months and endorses weight gain. He has never had a colonoscopy. He reports experiencing the same RUQ pain in February, which was also noted in a PCP visit in February 2015. A RUQ ultrasound was performed during the same month that showed polyps in his gallbladder, with one polyp of 1 cm in size located near the gallbladder neck. Denies urinary issues, dyspnea, chest tightness, fevers, chills, vomiting, change in appetite, blood in stool, thin stools, or family history of cancer. The history of communicated through a telephone interpreter.  Review of Systems: Constitutional: Diaphoresis. Denies fever, chills, appetite change and fatigue.  HEENT: Denies vision changes, congestion,  rhinorrhea. Respiratory: Denies SOB, DOE, cough, chest tightness, and wheezing.  Cardiovascular: Denies chest pain, palpitations and leg swelling.  Gastrointestinal: Per HPI. Genitourinary: Denies dysuria, urgency, frequency, hematuria, flank pain and difficulty urinating.  Musculoskeletal: Denies myalgias, back pain, joint swelling, arthralgias and gait problem.  Skin: Denies pallor, rash and wound.  Neurological: Denies dizziness, seizures, syncope, weakness, lightheadedness, numbness and headaches.  Hematological: Denies adenopathy, easy bruising.  Meds: Medications Prior to Admission  Medication Sig Dispense Refill  . glipiZIDE (GLUCOTROL) 5 MG tablet Take 1 tablet (5 mg total) by mouth 2 (two) times daily before a meal.  60 tablet  1  . metFORMIN (GLUCOPHAGE) 500 MG tablet Take 1 tablet (500 mg total) by mouth 2 (two) times daily with a meal.  60 tablet  2    Current Facility-Administered Medications  Medication Dose Route Frequency Provider Last Rate Last Dose  . 0.9 %  sodium chloride infusion   Intravenous Continuous Dow Adolphichard Kazibwe, MD 100 mL/hr at 10/23/13 1029    . enoxaparin (LOVENOX) injection 40 mg  40 mg Subcutaneous Q24H Dow Adolphichard Kazibwe, MD      . insulin aspart (novoLOG) injection 0-9 Units  0-9 Units Subcutaneous 6 times per day Dow Adolphichard Kazibwe, MD      . insulin glargine (LANTUS) injection 10 Units  10 Units Subcutaneous QHS Dow Adolphichard Kazibwe, MD      . morphine 2  MG/ML injection 2 mg  2 mg Intravenous Q4H PRN Dow Adolph, MD      . ondansetron [facility]) injection 4 mg  4 mg Intravenous Q8H PRN Samuel Jester, DO      . [START ON 10/24/2013] pneumococcal 23 valent vaccine (PNU-IMMUNE) injection 0.5 mL  0.5 mL Intramuscular Tomorrow-1000 Rocco Serene, MD      . polyethylene glycol-electrolytes (NuLYTELY/GoLYTELY) solution 4,000 mL  4,000 mL Oral Once Theda Belfast, MD      . [DISCONTINUED] 0.9 %  sodium chloride infusion   Intravenous STAT Samuel Jester, DO          Allergies: Allergies as of 10/23/2013  . (No Known Allergies)   Past Medical History  Diagnosis Date  . Diabetes mellitus    History reviewed. No pertinent past surgical history. Family History  Problem Relation Age of Onset  . Diabetes Mother   . Diabetes Sister   . Diabetes Brother    History   Social History  . Marital Status: Married    Spouse Name: N/A    Number of Children: N/A  . Years of Education: N/A   Occupational History  . Not on file.   Social History Main Topics  . Smoking status: Current Every Day Smoker -- 0.50 packs/day for 30 years    Types: Cigarettes  . Smokeless tobacco: Never Used  . Alcohol Use: No  . Drug Use: No  . Sexual Activity: Not on file   Other Topics Concern  . Not on file   Social History Narrative  . No narrative on file   Physical Exam: Filed Vitals:   10/23/13 1448  BP: 119/70  Pulse: 54  Temp: 97.9 F (36.6 C)  Resp: 16   General: well developed, well nourished; no acute distressed, cooperative with exam Head: atraumatic, normocephalic Eye: pupils equal, round and reactive; sclera anicteric; normal conjunctiva  Nose/throat: oropharynx clear, moist mucous membranes, pink gums  Neck: supple, no carotid bruits, thyroid normal in size and without palpable nodules  Lungs/Chest wall: clear to auscultation bilaterally, normal work of breathing  Heart: normal rate and regular rhythm; no murmurs Pulses: radial and dorsalis pedis pulses are 2+ and symmetric Abdomen: Tenderness to palpation in RUQ, positive Murphy's sign, voluntary guarding, non-distended, no rebound, normal bowel sounds; no masses or organomegaly  Skin: warm, dry, intact, normal turgor, no rashes  Extremities: no peripheral edema, clubbing, or cyanosis Neurologic: A&O X3, CN II - XII are grossly intact. Normal muscle bulk and tone. Psych: mood and affect are normal and congruent  Lab results: Basic Metabolic Panel:  Recent Labs  16/10/96 0900  NA  136*  K 4.4  CL 104  CO2 18*  GLUCOSE 152*  BUN 10  CREATININE 0.59  CALCIUM 9.4   Liver Function Tests:  Recent Labs  10/23/13 0900  AST 28  ALT 28  ALKPHOS 99  BILITOT 0.5  PROT 7.7  ALBUMIN 3.8    Recent Labs  10/23/13 0903  LIPASE 46   CBC:  Recent Labs  10/23/13 0900  WBC 10.0  NEUTROABS 6.2  HGB 16.9  HCT 48.2  MCV 88.0  PLT 191   Cardiac Enzymes:  Recent Labs  10/23/13 0905  TROPONINI <0.30   CBG:  Recent Labs  10/23/13 1604  GLUCAP 117*   Urinalysis:  Recent Labs  10/23/13 0838  COLORURINE YELLOW  LABSPEC 1.016  PHURINE 5.0  GLUCOSEU NEGATIVE  HGBUR NEGATIVE  BILIRUBINUR NEGATIVE  KETONESUR NEGATIVE  PROTEINUR 30*  UROBILINOGEN 0.2  NITRITE NEGATIVE  LEUKOCYTESUR NEGATIVE   Imaging results:  Dg Chest 2 View  10/23/2013   CLINICAL DATA:  Chest and abdominal pain  EXAM: CHEST  2 VIEW  COMPARISON:  None.  FINDINGS: Subsegmental atelectasis at the lung bases. Normal heart size. Upper lungs clear. No pneumothorax or pleural effusion  IMPRESSION: No active cardiopulmonary disease.   Electronically Signed   By: Maryclare Bean M.D.   On: 10/23/2013 10:08   Ct Abdomen Pelvis W Contrast  10/23/2013   CLINICAL DATA:  Upper abdominal pain, nausea, fever  EXAM: CT ABDOMEN AND PELVIS WITH CONTRAST  TECHNIQUE: Multidetector CT imaging of the abdomen and pelvis was performed using the standard protocol following bolus administration of intravenous contrast.  CONTRAST:  80mL OMNIPAQUE IOHEXOL 300 MG/ML  SOLN  COMPARISON:  09/24/2009  FINDINGS: Lung bases are unremarkable. There is disc space flattening with mild posterior disc bulge at L5-S1 level. Mild hepatic fatty infiltration. The liver shows no focal mass. No intrahepatic biliary ductal dilatation. No calcified gallstones are noted within gallbladder. The pancreas, spleen and adrenal glands are unremarkable.  Kidneys are symmetrical in size and enhancement. No hydronephrosis or hydroureter. No focal  renal mass. No aortic aneurysm.  No pericecal inflammation. Normal appendix. The terminal ileum is unremarkable.  Few diverticula are noted descending colon. No evidence of acute diverticulitis.  In axial image 62. There is focal concentric thickening of the proximal sigmoid colon wall. This is best seen in coronal image 27 and twenty-eighth. This is highly suspicious for colonic mass. Further correlation with colonoscopy is recommended. There is a mesenteric lymph node just inferior to sigmoid colon at this level measures 7 mm. This is suspicious for metastatic disease.  Prostate gland and seminal vesicles are unremarkable. Small left inguinal scrotal canal hernia containing fat without evidence of acute complication. No destructive bony lesions are noted within pelvis.  Delayed renal images shows bilateral renal symmetrical excretion. Bilateral visualized proximal ureter is unremarkable.  IMPRESSION: 1. There is abnormal focal thickening of colonic wall in proximal sigmoid colon axial images 61 and 62. These are suspicious for colonic mass. Further correlation with colonoscopy is recommended. Few colonic diverticula are noted left colon and sigmoid colon. No evidence of acute diverticulitis. 2. There is a mesenteric lymph node just inferior to sigmoid colon axial image 66 measures 7 mm. Metastatic disease cannot be excluded. 3. No hydronephrosis or hydroureter. 4. No small bowel obstruction. 5. These results were called by telephone at the time of interpretation on 10/23/2013 at 12:53 pm to Dr. Samuel Jester , who verbally acknowledged these results.   Electronically Signed   By: Natasha Mead M.D.   On: 10/23/2013 12:54    Other results: EKG: sinus bradycardia, right axis deviation.  Assessment & Plan by Problem: Active Problems:   Colonic mass   Type 2 diabetes mellitus   #RUQ Abdominal Pain:  Pain is severe, constant and acute-on-chronic in the RUQ. Given the location of the pain, we can consider  biliary colic and choledocholithiasis in the setting of known polyps on ultrasound from February and pain with spicy foods. The pain radiates to the back, which may be consistent with Herb Grays' sign. Cholecystitis should be considered because of a positive Murphy's sign, but he does not have a leukocytosis, fever, or hypoactive bowel sounds. CT scan on admission did not identify complications of acute cholecystitis, such as perforation, abscess, or pancreatitis. Another consideration is cholangitis with a positive Murphy's sign and RUQ. However,  he does not have jaundice or fevers, normal TBili (0.5), lack of leukocytosis or elevation in transaminases. Other possible etiologies include carcinoma of the gallbladder, hepatitis, pancreatitis, and pneumonia/empyema. However, there was no calcifications of the gallbladder and abnormal findings in the pancreas on CT, no weight loss, anorexia, or jaundice. Lipase and transaminases were normal, CXR was unremarkable, and pulmonary exam was unrevealing. Of note, there was bowel wall thickening with enlarged lymph nodes that is less likely the etiology of the pain. -Admit to med-surg. -Consult GI. -NS @ 100 mL/hr. -RUQ ulltrasound. -NPO until ultrasound completed. -If RUQ ultrasound inconclusive, consider HIDA scan or ERCP (for choledocholithiasis). -Morphine 2 mg q4h PRN for severe pain -CBC and CMP in AM  #Colonic wall thickening concerning for malignancy Abdominal CT today showed abnormal focal thickening of colonic wall in proximal sigmoid colon and a single mesenteric lymph node of 7mm inferior to sigmoid colon. Concerning for malignancy in setting of age, smoking history, and fatigue. However, has no family history of cancer and denies weight loss, hematochezia or melena, pencil-thin stools, obstructive, constipation, diarrhea, and changes in bowel habits. Has never had a colonoscopy and is largely asymptomatic for malignancy. Also concerned for infectious or  inflammation etiology, but he denies symptoms of diarrhea, fevers, and chills and does not have a leukocytosis. Of note, colonic diverticula are noted left colon and sigmoid colon.  -GI consult, as above for further workup. -Possible colonoscopy. -FOBT.  #DM2 Last HgbA1c 9.8 in 04/06/2013. He takes glipizide and metformin at home.  -Hold home glipizide and metformin. -Sensitive SSI q4 hours. -Check HgbA1c in AM. -CBG q4h  #Tobacco Use Smokes approximately  pack per day.  -Consider nicotine patch. -Case management for smoking cessation.  #DVT PPx -Lovenox  Dispo: Disposition is deferred at this time, awaiting improvement of current medical problems. Anticipated discharge in approximately 2 day(s).   The patient does have a current PCP (Jeanann Lewandowsky, MD), therefore will be require OPC follow-up after discharge.   The patient does not have transportation limitations that hinder transportation to clinic appointments.   Signed:  Luisa Dago, MD, PhD PGY-1 Internal Medicine Teaching Service Pager: [phone] 10/23/2013, 7:02 PM

## 2013-10-23 NOTE — ED Notes (Signed)
Attempted report 

## 2013-10-23 NOTE — Care Management (Signed)
10-23-13 Consult for medication assistance , [facility] has [facility] , unable to provide further assistance. Ronny FlurryHeather Terence Bart RN BSN

## 2013-10-23 NOTE — Consult Note (Signed)
Unassigned Consult  Reason for Consult: Sigmoid colon mass. Referring Physician: Teaching Service  Joseph Roberts HPI: This is a 53 year old male who presents with complaints of abdominal pain.  He speaks broken English, but Iam able to obtain a reasonable history.  He states that he has this pain for the past two years and it is in this abdomen.  I do not know why he had an acute worsening of his symptoms, but this is the reason that he presented to the hospital. In the past a CT scan revealed that he had a jejunitis in 2011, but he cannot remember how it ultimately resolved.  No reports of hematochezia, melena, diarrhea, or constipation.  There is no known family history of colon cancer.   A CT scan revealed a possible sigmoid colon mass and he denies any prior colonoscopies.  Past Medical History  Diagnosis Date  . Diabetes mellitus     History reviewed. No pertinent past surgical history.  Family History  Problem Relation Age of Onset  . Diabetes Mother   . Diabetes Sister   . Diabetes Brother     Social History:  reports that he has been smoking Cigarettes.  He has a 15 pack-year smoking history. He has never used smokeless tobacco. He reports that he does not drink alcohol or use illicit drugs.  Allergies: No Known Allergies  Medications:  Scheduled: . insulin aspart  0-9 Units Subcutaneous 6 times per day  . insulin glargine  10 Units Subcutaneous QHS  . [START ON 10/24/2013] pneumococcal 23 valent vaccine  0.5 mL Intramuscular Tomorrow-1000  . [DISCONTINUED] sodium chloride   Intravenous STAT   Continuous: . sodium chloride 100 mL/hr at 10/23/13 1029    Results for orders placed during the hospital encounter of 10/23/13 (from the past 24 hour(s))  URINALYSIS, ROUTINE W REFLEX MICROSCOPIC     Status: Abnormal   Collection Time    10/23/13  8:38 AM      Result Value Ref Range   Color, Urine YELLOW  YELLOW   APPearance CLEAR  CLEAR   Specific Gravity, Urine  1.016  1.005 - 1.030   pH 5.0  5.0 - 8.0   Glucose, UA NEGATIVE  NEGATIVE mg/dL   Hgb urine dipstick NEGATIVE  NEGATIVE   Bilirubin Urine NEGATIVE  NEGATIVE   Ketones, ur NEGATIVE  NEGATIVE mg/dL   Protein, ur 30 (*) NEGATIVE mg/dL   Urobilinogen, UA 0.2  0.0 - 1.0 mg/dL   Nitrite NEGATIVE  NEGATIVE   Leukocytes, UA NEGATIVE  NEGATIVE  URINE MICROSCOPIC-ADD ON     Status: None   Collection Time    10/23/13  8:38 AM      Result Value Ref Range   Squamous Epithelial / LPF RARE  RARE   WBC, UA 0-2  <3 WBC/hpf   RBC / HPF 0-2  <3 RBC/hpf   Bacteria, UA RARE  RARE   Urine-Other MUCOUS PRESENT    COMPREHENSIVE METABOLIC PANEL     Status: Abnormal   Collection Time    10/23/13  9:00 AM      Result Value Ref Range   Sodium 136 (*) 137 - 147 mEq/L   Potassium 4.4  3.7 - 5.3 mEq/L   Chloride 104  96 - 112 mEq/L   CO2 18 (*) 19 - 32 mEq/L   Glucose, Bld 152 (*) 70 - 99 mg/dL   BUN 10  6 - 23 mg/dL   Creatinine, Ser 0.59  0.50 -   1.35 mg/dL   Calcium 9.4  8.4 - 10.5 mg/dL   Total Protein 7.7  6.0 - 8.3 g/dL   Albumin 3.8  3.5 - 5.2 g/dL   AST 28  0 - 37 U/L   ALT 28  0 - 53 U/L   Alkaline Phosphatase 99  39 - 117 U/L   Total Bilirubin 0.5  0.3 - 1.2 mg/dL   GFR calc non Af Amer >90  >90 mL/min   GFR calc Af Amer >90  >90 mL/min   Anion gap 14  5 - 15  CBC WITH DIFFERENTIAL     Status: None   Collection Time    10/23/13  9:00 AM      Result Value Ref Range   WBC 10.0  4.0 - 10.5 K/uL   RBC 5.48  4.22 - 5.81 MIL/uL   Hemoglobin 16.9  13.0 - 17.0 g/dL   HCT 48.2  39.0 - 52.0 %   MCV 88.0  78.0 - 100.0 fL   MCH 30.8  26.0 - 34.0 pg   MCHC 35.1  30.0 - 36.0 g/dL   RDW 13.2  11.5 - 15.5 %   Platelets 191  150 - 400 K/uL   Neutrophils Relative % 61  43 - 77 %   Neutro Abs 6.2  1.7 - 7.7 K/uL   Lymphocytes Relative 29  12 - 46 %   Lymphs Abs 2.9  0.7 - 4.0 K/uL   Monocytes Relative 6  3 - 12 %   Monocytes Absolute 0.6  0.1 - 1.0 K/uL   Eosinophils Relative 3  0 - 5 %    Eosinophils Absolute 0.3  0.0 - 0.7 K/uL   Basophils Relative 1  0 - 1 %   Basophils Absolute 0.1  0.0 - 0.1 K/uL  LIPASE, BLOOD     Status: None   Collection Time    10/23/13  9:03 AM      Result Value Ref Range   Lipase 46  11 - 59 U/L  TROPONIN I     Status: None   Collection Time    10/23/13  9:05 AM      Result Value Ref Range   Troponin I <0.30  <0.30 ng/mL  GLUCOSE, CAPILLARY     Status: Abnormal   Collection Time    10/23/13  4:04 PM      Result Value Ref Range   Glucose-Capillary 117 (*) 70 - 99 mg/dL     Dg Chest 2 View  10/23/2013   CLINICAL DATA:  Chest and abdominal pain  EXAM: CHEST  2 VIEW  COMPARISON:  None.  FINDINGS: Subsegmental atelectasis at the lung bases. Normal heart size. Upper lungs clear. No pneumothorax or pleural effusion  IMPRESSION: No active cardiopulmonary disease.   Electronically Signed   By: Art  Hoss M.D.   On: 10/23/2013 10:08   Ct Abdomen Pelvis W Contrast  10/23/2013   CLINICAL DATA:  Upper abdominal pain, nausea, fever  EXAM: CT ABDOMEN AND PELVIS WITH CONTRAST  TECHNIQUE: Multidetector CT imaging of the abdomen and pelvis was performed using the standard protocol following bolus administration of intravenous contrast.  CONTRAST:  80mL OMNIPAQUE IOHEXOL 300 MG/ML  SOLN  COMPARISON:  09/24/2009  FINDINGS: Lung bases are unremarkable. There is disc space flattening with mild posterior disc bulge at L5-S1 level. Mild hepatic fatty infiltration. The liver shows no focal mass. No intrahepatic biliary ductal dilatation. No calcified gallstones are noted within gallbladder. The pancreas,   spleen and adrenal glands are unremarkable.  Kidneys are symmetrical in size and enhancement. No hydronephrosis or hydroureter. No focal renal mass. No aortic aneurysm.  No pericecal inflammation. Normal appendix. The terminal ileum is unremarkable.  Few diverticula are noted descending colon. No evidence of acute diverticulitis.  In axial image 62. There is focal concentric  thickening of the proximal sigmoid colon wall. This is best seen in coronal image 27 and twenty-eighth. This is highly suspicious for colonic mass. Further correlation with colonoscopy is recommended. There is a mesenteric lymph node just inferior to sigmoid colon at this level measures 7 mm. This is suspicious for metastatic disease.  Prostate gland and seminal vesicles are unremarkable. Small left inguinal scrotal canal hernia containing fat without evidence of acute complication. No destructive bony lesions are noted within pelvis.  Delayed renal images shows bilateral renal symmetrical excretion. Bilateral visualized proximal ureter is unremarkable.  IMPRESSION: 1. There is abnormal focal thickening of colonic wall in proximal sigmoid colon axial images 61 and 62. These are suspicious for colonic mass. Further correlation with colonoscopy is recommended. Few colonic diverticula are noted left colon and sigmoid colon. No evidence of acute diverticulitis. 2. There is a mesenteric lymph node just inferior to sigmoid colon axial image 66 measures 7 mm. Metastatic disease cannot be excluded. 3. No hydronephrosis or hydroureter. 4. No small bowel obstruction. 5. These results were called by telephone at the time of interpretation on 10/23/2013 at 12:53 pm to Dr. KATHLEEN MCMANUS , who verbally acknowledged these results.   Electronically Signed   By: Liviu  Pop M.D.   On: 10/23/2013 12:54    ROS:  As stated above in the HPI otherwise negative.  Blood pressure 119/70, pulse 54, temperature 97.9 F (36.6 C), temperature source Oral, resp. rate 16, height 5' 4.17" (1.63 m), weight 180 lb 12.4 oz (82 kg), SpO2 99.00%.    PE: Gen: NAD, Alert and Oriented HEENT:  [place]/AT, EOMI Neck: Supple, no LAD Lungs: CTA Bilaterally CV: RRR without M/G/R ABM: Soft, NTND, +BS, no reproducible pain at this time. Ext: No C/C/E  Assessment/Plan: 1) Sigmoid colon thickening/mass. 2) Sigmoid diverticula. 3) ABM pain.   His  physical examination was rather benign.  In comparison to his CT scan in 2011 there is a change in the sigmoid region.  Radiology feels that there may be a mass.  It is unusual for a colon cancer to present with abdominal pain and the site of his pain does not seem to correlate with the sigmoid region. He has diverticula, but there is no evidence of a diverticulitis.  He is over the age of 51 and he has never had a colonoscopy and further evaluation is required.  Plan: 1) Colonoscopy tomorrow.  Kyrus Hyde D 10/23/2013, 4:39 PM      

## 2013-10-23 NOTE — ED Provider Notes (Signed)
CSN: [id number]     Arrival date & time 10/23/13  4098 History   First MD Initiated Contact with Patient 10/23/13 [phone]     Chief Complaint  Patient presents with  . Abdominal Pain      HPI Pt was seen at 0925.  Per pt, c/o gradual onset and persistence of constant generalized abd "pain" that began approximately 0300/0500 overnight last night.  Has been associated with nausea.  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 Denies vomiting/diarrhea, no fevers, no back pain, no rash, no CP/SOB, no black or blood in stools.       Past Medical History  Diagnosis Date  . Diabetes mellitus    History reviewed. No pertinent past surgical history.  Family History  Problem Relation Age of Onset  . Diabetes Mother   . Diabetes Sister   . Diabetes Brother    History  Substance Use Topics  . Smoking status: Current Every Day Smoker  . Smokeless tobacco: Not on file  . Alcohol Use: No    Review of Systems ROS: Statement: All systems negative except as marked or noted in the HPI; Constitutional: Negative for fever and chills. ; ; Eyes: Negative for eye pain, redness and discharge. ; ; ENMT: Negative for ear pain, hoarseness, nasal congestion, sinus pressure and sore throat. ; ; Cardiovascular: Negative for chest pain, palpitations, diaphoresis, dyspnea and peripheral edema. ; ; Respiratory: Negative for cough, wheezing and stridor. ; ; Gastrointestinal: +nausea, abd pain. Negative for vomiting, diarrhea, blood in stool, hematemesis, jaundice and rectal bleeding. . ; ; Genitourinary: Negative for dysuria, flank pain and hematuria. ; ; Musculoskeletal: Negative for back pain and neck pain. Negative for swelling and trauma.; ; Skin: Negative for pruritus, rash, abrasions, blisters, bruising and skin lesion.; ; Neuro: Negative for headache, lightheadedness and neck stiffness. Negative for  weakness, altered level of consciousness , altered mental status, extremity weakness, paresthesias, involuntary movement, seizure and syncope.      Allergies  Review of patient's allergies indicates no known allergies.  Home Medications   Prior to Admission medications   Medication Sig Start Date End Date Taking? Authorizing Provider  glipiZIDE (GLUCOTROL) 5 MG tablet Take 1 tablet (5 mg total) by mouth 2 (two) times daily before a meal. 04/22/13  Yes Linna Hoff, MD  metFORMIN (GLUCOPHAGE) 500 MG tablet Take 1 tablet (500 mg total) by mouth 2 (two) times daily with a meal. 04/06/13  Yes Richarda Overlie, MD   BP 127/84  Pulse 68  Temp(Src) 98 F (36.7 C) (Oral)  Resp 20  Ht 5' 4.17" (1.63 m)  Wt 180 lb 12.4 oz (82 kg)  BMI 30.86 kg/m2  SpO2 95% Physical Exam 0930: Physical examination:  Nursing notes reviewed; Vital signs and O2 SAT reviewed;  Constitutional: Well developed, Well nourished, Well hydrated, Uncomfortable appearing.; Head:  Normocephalic, atraumatic; Eyes: EOMI, PERRL, No scleral icterus; ENMT: Mouth and pharynx normal, Mucous membranes moist; Neck: Supple, Full range of motion, No lymphadenopathy; Cardiovascular: Regular rate and rhythm, No murmur, rub, or gallop; Respiratory: Breath sounds clear & equal bilaterally, No rales, rhonchi, wheezes.  Speaking full sentences with ease, Normal respiratory effort/excursion; Chest: Nontender, Movement normal; Abdomen: Soft, +diffuse tenderness to palp. No rebound or guarding. Nondistended, Normal bowel sounds; Genitourinary: No CVA tenderness; Extremities: Pulses normal, No tenderness, No edema, No calf edema or asymmetry.; Neuro: AA&Ox3, Major  CN grossly intact.  Speech clear. No gross focal motor or sensory deficits in extremities.; Skin: Color normal, Warm, Dry.    ED Course  Procedures     EKG Interpretation   Date/Time:  Friday October 23 2013 10:36:12 EDT Ventricular Rate:  50 PR Interval:  155 QRS Duration: 96 QT  Interval:  405 QTC Calculation: 369 R Axis:   101 Text Interpretation:  Sinus rhythm Right axis deviation No old tracing to  compare Confirmed by [facility]  MD, Nicholos Johns [phone]) on 10/23/2013 10:40:43  AM      MDM  MDM Reviewed: previous chart, nursing note and vitals Reviewed previous: labs and ECG Interpretation: labs, ECG, x-ray and CT scan     Results for orders placed during the hospital encounter of 10/23/13  COMPREHENSIVE METABOLIC PANEL      Result Value Ref Range   Sodium 136 (*) 137 - 147 mEq/L   Potassium 4.4  3.7 - 5.3 mEq/L   Chloride 104  96 - 112 mEq/L   CO2 18 (*) 19 - 32 mEq/L   Glucose, Bld 152 (*) 70 - 99 mg/dL   BUN 10  6 - 23 mg/dL   Creatinine, Ser 6.04  0.50 - 1.35 mg/dL   Calcium 9.4  8.4 - 54.0 mg/dL   Total Protein 7.7  6.0 - 8.3 g/dL   Albumin 3.8  3.5 - 5.2 g/dL   AST 28  0 - 37 U/L   ALT 28  0 - 53 U/L   Alkaline Phosphatase 99  39 - 117 U/L   Total Bilirubin 0.5  0.3 - 1.2 mg/dL   GFR calc non Af Amer >90  >90 mL/min   GFR calc Af Amer >90  >90 mL/min   Anion gap 14  5 - 15  CBC WITH DIFFERENTIAL      Result Value Ref Range   WBC 10.0  4.0 - 10.5 K/uL   RBC 5.48  4.22 - 5.81 MIL/uL   Hemoglobin 16.9  13.0 - 17.0 g/dL   HCT 98.1  19.1 - 47.8 %   MCV 88.0  78.0 - 100.0 fL   MCH 30.8  26.0 - 34.0 pg   MCHC 35.1  30.0 - 36.0 g/dL   RDW 29.5  62.1 - 30.8 %   Platelets 191  150 - 400 K/uL   Neutrophils Relative % 61  43 - 77 %   Neutro Abs 6.2  1.7 - 7.7 K/uL   Lymphocytes Relative 29  12 - 46 %   Lymphs Abs 2.9  0.7 - 4.0 K/uL   Monocytes Relative 6  3 - 12 %   Monocytes Absolute 0.6  0.1 - 1.0 K/uL   Eosinophils Relative 3  0 - 5 %   Eosinophils Absolute 0.3  0.0 - 0.7 K/uL   Basophils Relative 1  0 - 1 %   Basophils Absolute 0.1  0.0 - 0.1 K/uL  URINALYSIS, ROUTINE W REFLEX MICROSCOPIC      Result Value Ref Range   Color, Urine YELLOW  YELLOW   APPearance CLEAR  CLEAR   Specific Gravity, Urine 1.016  1.005 - 1.030   pH 5.0  5.0  - 8.0   Glucose, UA NEGATIVE  NEGATIVE mg/dL   Hgb urine dipstick NEGATIVE  NEGATIVE   Bilirubin Urine NEGATIVE  NEGATIVE   Ketones, ur NEGATIVE  NEGATIVE mg/dL   Protein, ur 30 (*) NEGATIVE mg/dL   Urobilinogen, UA 0.2  0.0 - 1.0 mg/dL  Nitrite NEGATIVE  NEGATIVE   Leukocytes, UA NEGATIVE  NEGATIVE  LIPASE, BLOOD      Result Value Ref Range   Lipase 46  11 - 59 U/L  URINE MICROSCOPIC-ADD ON      Result Value Ref Range   Squamous Epithelial / LPF RARE  RARE   WBC, UA 0-2  <3 WBC/hpf   RBC / HPF 0-2  <3 RBC/hpf   Bacteria, UA RARE  RARE   Urine-Other MUCOUS PRESENT    TROPONIN I      Result Value Ref Range   Troponin I <0.30  <0.30 ng/mL   Dg Chest 2 View 10/23/2013   CLINICAL DATA:  Chest and abdominal pain  EXAM: CHEST  2 VIEW  COMPARISON:  None.  FINDINGS: Subsegmental atelectasis at the lung bases. Normal heart size. Upper lungs clear. No pneumothorax or pleural effusion  IMPRESSION: No active cardiopulmonary disease.   Electronically Signed   By: [name]  Hoss M.D.   On: 10/23/2013 10:08   Ct Abdomen Pelvis W Contrast 10/23/2013   CLINICAL DATA:  Upper abdominal pain, nausea, fever  EXAM: CT ABDOMEN AND PELVIS WITH CONTRAST  TECHNIQUE: Multidetector CT imaging of the abdomen and pelvis was performed using the standard protocol following bolus administration of intravenous contrast.  CONTRAST:  80mL OMNIPAQUE IOHEXOL 300 MG/ML  SOLN  COMPARISON:  09/24/2009  FINDINGS: Lung bases are unremarkable. There is disc space flattening with mild posterior disc bulge at L5-S1 level. Mild hepatic fatty infiltration. The liver shows no focal mass. No intrahepatic biliary ductal dilatation. No calcified gallstones are noted within gallbladder. The pancreas, spleen and adrenal glands are unremarkable.  Kidneys are symmetrical in size and enhancement. No hydronephrosis or hydroureter. No focal renal mass. No aortic aneurysm.  No pericecal inflammation. Normal appendix. The terminal ileum is unremarkable.   Few diverticula are noted descending colon. No evidence of acute diverticulitis.  In axial image 62. There is focal concentric thickening of the proximal sigmoid colon wall. This is best seen in coronal image 27 and twenty-eighth. This is highly suspicious for colonic mass. Further correlation with colonoscopy is recommended. There is a mesenteric lymph node just inferior to sigmoid colon at this level measures 7 mm. This is suspicious for metastatic disease.  Prostate gland and seminal vesicles are unremarkable. Small left inguinal scrotal canal hernia containing fat without evidence of acute complication. No destructive bony lesions are noted within pelvis.  Delayed renal images shows bilateral renal symmetrical excretion. Bilateral visualized proximal ureter is unremarkable.  IMPRESSION: 1. There is abnormal focal thickening of colonic wall in proximal sigmoid colon axial images 61 and 62. These are suspicious for colonic mass. Further correlation with colonoscopy is recommended. Few colonic diverticula are noted left colon and sigmoid colon. No evidence of acute diverticulitis. 2. There is a mesenteric lymph node just inferior to sigmoid colon axial image 66 measures 7 mm. Metastatic disease cannot be excluded. 3. No hydronephrosis or hydroureter. 4. No small bowel obstruction. 5. These results were called by telephone at the time of interpretation on 10/23/2013 at 12:53 pm to Dr. Samuel JesterKATHLEEN Ephraim Reichel , who verbally acknowledged these results.   Electronically Signed   By: Natasha MeadLiviu  Pop M.D.   On: 10/23/2013 12:54    1310:  Pt continues to c/o abd pain; abd TTP, will re-dose pain meds. T/C from [facility] MD: states CT with colon mass and concern for mets. Will admit. Dx and testing d/w pt and family.  Questions answered.  Verb understanding,  agreeable to admit. T/C to [facility] Resident, case discussed, including:  HPI, pertinent PM/SHx, VS/PE, dx testing, ED course and treatment:  Agreeable to admit, requests to write  temporary orders, obtain medical bed to Dr. Dorris Singh service.   Samuel Jester, DO 10/24/13 1624

## 2013-10-24 ENCOUNTER — Encounter (HOSPITAL_COMMUNITY)
Admission: EM | Disposition: A | Discharge status: Home / Self Care | Payer: Self-pay | Source: Home / Self Care | Attending: Internal Medicine

## 2013-10-24 ENCOUNTER — Encounter (HOSPITAL_COMMUNITY): Payer: Self-pay | Admitting: *Deleted

## 2013-10-24 DIAGNOSIS — K824 Cholesterolosis of gallbladder: Secondary | ICD-10-CM | POA: Diagnosis present

## 2013-10-24 DIAGNOSIS — R1011 Right upper quadrant pain: Secondary | ICD-10-CM

## 2013-10-24 DIAGNOSIS — R933 Abnormal findings on diagnostic imaging of other parts of digestive tract: Secondary | ICD-10-CM | POA: Diagnosis present

## 2013-10-24 DIAGNOSIS — K219 Gastro-esophageal reflux disease without esophagitis: Secondary | ICD-10-CM | POA: Diagnosis present

## 2013-10-24 HISTORY — PX: COLONOSCOPY: SHX5424

## 2013-10-24 LAB — COMPREHENSIVE METABOLIC PANEL WITH GFR
ALT: 23 U/L (ref 0–53)
AST: 21 U/L (ref 0–37)
Albumin: 3.2 g/dL — ABNORMAL LOW (ref 3.5–5.2)
Alkaline Phosphatase: 88 U/L (ref 39–117)
Anion gap: 13 (ref 5–15)
BUN: 6 mg/dL (ref 6–23)
CO2: 22 meq/L (ref 19–32)
Calcium: 8.9 mg/dL (ref 8.4–10.5)
Chloride: 107 meq/L (ref 96–112)
Creatinine, Ser: 0.62 mg/dL (ref 0.50–1.35)
GFR calc Af Amer: >90 mL/min
GFR calc non Af Amer: >90 mL/min
Glucose, Bld: 123 mg/dL — ABNORMAL HIGH (ref 70–99)
Potassium: 4 meq/L (ref 3.7–5.3)
Sodium: 142 meq/L (ref 137–147)
Total Bilirubin: 0.7 mg/dL (ref 0.3–1.2)
Total Protein: 6.4 g/dL (ref 6.0–8.3)

## 2013-10-24 LAB — CBC
HCT: 44.1 % (ref 39.0–52.0)
Hemoglobin: 15.3 g/dL (ref 13.0–17.0)
MCH: 29.8 pg (ref 26.0–34.0)
MCHC: 34.7 g/dL (ref 30.0–36.0)
MCV: 85.8 fL (ref 78.0–100.0)
Platelets: 184 10*3/uL (ref 150–400)
RBC: 5.14 MIL/uL (ref 4.22–5.81)
RDW: 13.1 % (ref 11.5–15.5)
WBC: 7 10*3/uL (ref 4.0–10.5)

## 2013-10-24 LAB — GLUCOSE, CAPILLARY
GLUCOSE-CAPILLARY: 130 mg/dL — AB (ref 70–99)
Glucose-Capillary: 123 mg/dL — ABNORMAL HIGH (ref 70–99)
Glucose-Capillary: 106 mg/dL — ABNORMAL HIGH (ref 70–99)
Glucose-Capillary: 111 mg/dL — ABNORMAL HIGH (ref 70–99)

## 2013-10-24 LAB — HEMOGLOBIN A1C
Hgb A1c MFr Bld: 6.9 % — ABNORMAL HIGH (ref <5.7)
MEAN PLASMA GLUCOSE: 151 mg/dL — AB (ref <117)

## 2013-10-24 SURGERY — COLONOSCOPY
Anesthesia: Moderate Sedation

## 2013-10-24 MED ORDER — DIPHENHYDRAMINE HCL 50 MG/ML IJ SOLN
INTRAMUSCULAR | Status: AC
  Filled 2013-10-24: qty 1

## 2013-10-24 MED ORDER — FENTANYL CITRATE 0.05 MG/ML IJ SOLN
INTRAMUSCULAR | Status: DC | PRN
  Administered 2013-10-24 (×4): 25 ug via INTRAVENOUS

## 2013-10-24 MED ORDER — PANTOPRAZOLE SODIUM 40 MG PO TBEC: 40.0000 mg | DELAYED_RELEASE_TABLET | Freq: Every day | ORAL | Status: DC

## 2013-10-24 MED ORDER — MIDAZOLAM HCL 5 MG/5ML IJ SOLN
INTRAMUSCULAR | Status: DC | PRN
  Administered 2013-10-24: 2 mg via INTRAVENOUS
  Administered 2013-10-24: 1 mg via INTRAVENOUS
  Administered 2013-10-24: 2.5 mg via INTRAVENOUS
  Administered 2013-10-24: 2 mg via INTRAVENOUS
  Administered 2013-10-24: 2.5 mg via INTRAVENOUS

## 2013-10-24 MED ORDER — LACTATED RINGERS IV SOLN
Freq: Once | INTRAVENOUS | Status: AC
  Administered 2013-10-24: 1000 mL via INTRAVENOUS

## 2013-10-24 MED ORDER — HYDROCOD POLST-CHLORPHEN POLST 10-8 MG/5ML PO LQCR: 2.5000 mL | Freq: Two times a day (BID) | ORAL | Status: DC | PRN

## 2013-10-24 MED ORDER — MIDAZOLAM HCL 5 MG/ML IJ SOLN
INTRAMUSCULAR | Status: AC
  Filled 2013-10-24: qty 4

## 2013-10-24 MED ORDER — FENTANYL CITRATE 0.05 MG/ML IJ SOLN
INTRAMUSCULAR | Status: AC
  Filled 2013-10-24: qty 4

## 2013-10-24 NOTE — Progress Notes (Signed)
Subjective: Feeling better. Had a colonoscopy this a.m without complication. Complaining of sore throat without other symptoms.  No fevers.  Objective: Vital signs in last 24 hours: Filed Vitals:   10/24/13 1030 10/24/13 1035 10/24/13 1040 10/24/13 1110  BP: 106/70 106/65 115/71 127/77  Pulse: 63 56 57 45  Temp:    97.5 F (36.4 C)  TempSrc:    Oral  Resp: 15 18 18 18   Height:      Weight:      SpO2: 98% 96% 96% 97%   Weight change:   Intake/Output Summary (Last 24 hours) at 10/24/13 1330 Last data filed at 10/24/13 1051  Gross per 24 hour  Intake 2184.33 ml  Output      0 ml  Net 2184.33 ml    General: Vital signs reviewed. No distress. Interpreter in room. Lungs: Clear to auscultation bilaterally  Heart: RRR; no extra sounds or murmurs  Abdomen: Tenderness to palpation in RUQ but better compared to yesterday. No Murphy's sign as was seen yesterday.  Non-distended, no rebound, normal bowel sounds; no masses or organomegaly  Extremities: No bilateral ankle edema  Neurologic: Alert and oriented x3. Moves all extremities  Lab Results: Basic Metabolic Panel:  Recent Labs Lab 10/23/13 0900 10/24/13 0530  NA 136* 142  K 4.4 4.0  CL 104 107  CO2 18* 22  GLUCOSE 152* 123*  BUN 10 6  CREATININE 0.59 0.62  CALCIUM 9.4 8.9   Liver Function Tests:  Recent Labs Lab 10/23/13 0900 10/24/13 0530  AST 28 21  ALT 28 23  ALKPHOS 99 88  BILITOT 0.5 0.7  PROT 7.7 6.4  ALBUMIN 3.8 3.2*    Recent Labs Lab 10/23/13 0903  LIPASE 46   No results found for this basename: AMMONIA,  in the last 168 hours CBC:  Recent Labs Lab 10/23/13 0900 10/24/13 0530  WBC 10.0 7.0  NEUTROABS 6.2  --   HGB 16.9 15.3  HCT 48.2 44.1  MCV 88.0 85.8  PLT 191 184   Cardiac Enzymes:  Recent Labs Lab 10/23/13 0905  TROPONINI <0.30   CBG:  Recent Labs Lab 10/23/13 2041 10/23/13 2359 10/24/13 0454 10/24/13 0812 10/24/13 0959 10/24/13 1235  GLUCAP 144* 133* 123* 130*  106* 111*   Urinalysis:  Recent Labs Lab 10/23/13 0838  COLORURINE YELLOW  LABSPEC 1.016  PHURINE 5.0  GLUCOSEU NEGATIVE  HGBUR NEGATIVE  BILIRUBINUR NEGATIVE  KETONESUR NEGATIVE  PROTEINUR 30*  UROBILINOGEN 0.2  NITRITE NEGATIVE  LEUKOCYTESUR NEGATIVE    Micro Results: No results found for this or any previous visit (from the past 240 hour(s)). Studies/Results: Dg Chest 2 View  10/23/2013   CLINICAL DATA:  Chest and abdominal pain  EXAM: CHEST  2 VIEW  COMPARISON:  None.  FINDINGS: Subsegmental atelectasis at the lung bases. Normal heart size. Upper lungs clear. No pneumothorax or pleural effusion  IMPRESSION: No active cardiopulmonary disease.   Electronically Signed   By: Maryclare Bean M.D.   On: 10/23/2013 10:08   Ct Abdomen Pelvis W Contrast  10/23/2013   CLINICAL DATA:  Upper abdominal pain, nausea, fever  EXAM: CT ABDOMEN AND PELVIS WITH CONTRAST  TECHNIQUE: Multidetector CT imaging of the abdomen and pelvis was performed using the standard protocol following bolus administration of intravenous contrast.  CONTRAST:  80mL OMNIPAQUE IOHEXOL 300 MG/ML  SOLN  COMPARISON:  09/24/2009  FINDINGS: Lung bases are unremarkable. There is disc space flattening with mild posterior disc bulge at L5-S1 level. Mild hepatic  fatty infiltration. The liver shows no focal mass. No intrahepatic biliary ductal dilatation. No calcified gallstones are noted within gallbladder. The pancreas, spleen and adrenal glands are unremarkable.  Kidneys are symmetrical in size and enhancement. No hydronephrosis or hydroureter. No focal renal mass. No aortic aneurysm.  No pericecal inflammation. Normal appendix. The terminal ileum is unremarkable.  Few diverticula are noted descending colon. No evidence of acute diverticulitis.  In axial image 62. There is focal concentric thickening of the proximal sigmoid colon wall. This is best seen in coronal image 27 and twenty-eighth. This is highly suspicious for colonic mass.  Further correlation with colonoscopy is recommended. There is a mesenteric lymph node just inferior to sigmoid colon at this level measures 7 mm. This is suspicious for metastatic disease.  Prostate gland and seminal vesicles are unremarkable. Small left inguinal scrotal canal hernia containing fat without evidence of acute complication. No destructive bony lesions are noted within pelvis.  Delayed renal images shows bilateral renal symmetrical excretion. Bilateral visualized proximal ureter is unremarkable.  IMPRESSION: 1. There is abnormal focal thickening of colonic wall in proximal sigmoid colon axial images 61 and 62. These are suspicious for colonic mass. Further correlation with colonoscopy is recommended. Few colonic diverticula are noted left colon and sigmoid colon. No evidence of acute diverticulitis. 2. There is a mesenteric lymph node just inferior to sigmoid colon axial image 66 measures 7 mm. Metastatic disease cannot be excluded. 3. No hydronephrosis or hydroureter. 4. No small bowel obstruction. 5. These results were called by telephone at the time of interpretation on 10/23/2013 at 12:53 pm to Dr. Samuel Jester , who verbally acknowledged these results.   Electronically Signed   By: Natasha Mead M.D.   On: 10/23/2013 12:54   US Abdomen Limited  10/23/2013   CLINICAL DATA:  Abdominal pain.  EXAM: US ABDOMEN LIMITED - RIGHT UPPER QUADRANT  COMPARISON:  CT scan of same day.  FINDINGS: Gallbladder:  No gallstones or wall thickening visualized. No sonographic Murphy sign noted. Multiple gallbladder polyps are noted with the largest measuring 9 mm.  Common bile duct:  Diameter: Measures 3.7 mm which is within normal limits.  Liver:  Fatty infiltration of the liver is noted with sparing around the gallbladder fossa.  IMPRESSION: Fatty infiltration of the liver. No cholelithiasis is noted. Multiple gallbladder polyps are noted with the largest measuring 9 mm; followup ultrasound in 1 year is recommended  to ensure stability and rule out neoplasm.   Electronically Signed   By: Roque Lias M.D.   On: 10/23/2013 19:47   Medications: I have reviewed the patient's current medications. Scheduled Meds: . enoxaparin (LOVENOX) injection  40 mg Subcutaneous Q24H  . insulin aspart  0-9 Units Subcutaneous 6 times per day  . insulin glargine  10 Units Subcutaneous QHS   Continuous Infusions: . sodium chloride     PRN Meds:.morphine injection Assessment/Plan: Principal Problem:   Colonic mass Active Problems:   Type 2 diabetes mellitus   Metabolic acidosis, normal anion gap (NAG)   Abdominal pain, right upper quadrant   Nonspecific (abnormal) findings on radiological and other examination of gastrointestinal tract   RUQ Pain: Better today. Etiology unclear. LFTs are normal. Abdominal CT scan did not identify complications of acute cholecystitis, such as perforation, abscess, or pancreatitis. U/S of RUQ revealed Fatty infiltration of the liver without cholelithiasis. There were multiple gallbladder polyps are noted with the largest measuring 9 mm. Recommended to follow up with ultrasound in 1 year to  ensure stability and rule out neoplasm. He might benefit from a PPI in case this is dyspepsis.  Plan  - on full liquid for now - tylenol for pain for now  - follow up with GI as outpatient  Colonic wall thickening seen on abdominal CT: Abdominal CT showed abnormal focal thickening of colonic wall in proximal sigmoid colon and a single mesenteric lymph node of 7mm inferior to sigmoid colon. Colonoscopy is normal besides mild diverticulosis in the descending colon and sigmoid colon with midly thickened sigmoid colon folds. There were also small internal hemorrhoids noted.  Plan - GI recommends follow colorectal cancer screening guidelines for "routine risk"  patients with a repeat colonoscopy in 10 years. - will discharge home tomorrow if he stable.   DM2: Last HgbA1c 9.8 in 04/06/2013. He takes glipizide  and metformin at home.  Plan  - Lantus and SSI  Tobacco Use  Smokes approximately  pack per day.  -Consider nicotine patch.  -Case management for smoking cessation.   DVT PPx  -Lovenox   Dispo: Disposition is deferred at this time, awaiting improvement of current medical problems. Will be discharged tomorrow if he remains clinically stable  The patient does have a current PCP (Jeanann Lewandowskylugbemiga Jegede, MD), therefore will be require [facility]PC follow-up after discharge.   The patient does not have transportation limitations that hinder transportation to clinic appointments.     LOS: 1 day   Dow AdolphKazibwe, Vaudine Dutan PGY 3 - Internal Medicine Teaching Service Pager: [phone] 10/24/2013, 1:30 PM

## 2013-10-24 NOTE — H&P (Signed)
Internal Medicine Attending Admission Note Date: 10/24/2013  Patient name: Joseph Roberts Medical record number: [id number] Date of birth: 1960/06/08 Age: 53 y.o. Gender: male  I saw and evaluated the patient. I reviewed the resident's note and I agree with the resident's findings and plan as documented in the resident's note.  Chief Complaint(s): Acute right upper quadrant pain.  History - key components related to admission:  [name] is a 53 year old man with a history of diabetes and gallbladder polyps who presents with the acute onset of severe abdominal pain in the right upper quadrant and associated nausea. He was in his usual state of health until 3 in the morning when he developed the acute onset of severe constant right upper quadrant pain which radiated to the back. He was at work at the time and was unable to finish his shift. He therefore reported to the emergency department for further evaluation. He states that the pain will occur after eating certain foods. He had a similar bout of severe right upper quadrant pain in February of this year at which point an ultrasound was ordered and demonstrated the gallbladder polyps. While in the emergency department he underwent CT scanning which demonstrated a possible sigmoid mass. He was therefore admitted to the internal medicine teaching service for further evaluation and care.  Since admission he underwent a colonoscopy which was only remarkable for mild diverticulosis of the left colon. There was no sigmoid mass but some thickening of the colonic folds felt to be related to the diverticulosis. His right upper quadrant pain is also completely resolved. He feels ready for discharge home at this time.  Physical Exam - key components related to admission:  Filed Vitals:   10/24/13 1030 10/24/13 1035 10/24/13 1040 10/24/13 1110  BP: 106/70 106/65 115/71 127/77  Pulse: 63 56 57 45  Temp:    97.5 F (36.4 C)  TempSrc:     Oral  Resp: Height:      Weight:      SpO2: 98% 96% 96% 97%   General: Well-developed, well-nourished, man lying comfortably in bed in no acute distress. Abdomen: Soft, nontender, active bowel sounds without masses or hepatosplenomegaly. Murphy's sign is absent.  Lab results:  Basic Metabolic Panel:  Recent Labs  78/29/56 0900 10/24/13 0530  NA 136* 142  K 4.4 4.0  CL 104 107  CO2 18* 22  GLUCOSE 152* 123*  BUN 10 6  CREATININE 0.59 0.62  CALCIUM 9.4 8.9   Liver Function Tests:  Recent Labs  10/23/13 0900 10/24/13 0530  AST 28 21  ALT 28 23  ALKPHOS 99 88  BILITOT 0.5 0.7  PROT 7.7 6.4  ALBUMIN 3.8 3.2*    Recent Labs  10/23/13 0903  LIPASE 46   CBC:  Recent Labs  10/23/13 0900 10/24/13 0530  WBC 10.0 7.0  NEUTROABS 6.2  --   HGB 16.9 15.3  HCT 48.2 44.1  MCV 88.0 85.8  PLT 191 184   Cardiac Enzymes:  Recent Labs  10/23/13 0905  TROPONINI <0.30   CBG:  Recent Labs  10/23/13 2041 10/23/13 2359 10/24/13 0454 10/24/13 0812 10/24/13 0959 10/24/13 1235  GLUCAP 144* 133* 123* 130* 106* 111*   Hemoglobin A1C:  Recent Labs  10/24/13 0530  HGBA1C 6.9*   Urinalysis:  Protein 30, white blood cells 0-2 per high-power field, red blood cells 0-2 per high-power field.  Imaging results:  Dg Chest 2 View  10/23/2013   CLINICAL  DATA:  Chest and abdominal pain  EXAM: CHEST  2 VIEW  COMPARISON:  None.  FINDINGS: Subsegmental atelectasis at the lung bases. Normal heart size. Upper lungs clear. No pneumothorax or pleural effusion  IMPRESSION: No active cardiopulmonary disease.   Electronically Signed   By: Maryclare Bean M.D.   On: 10/23/2013 10:08   Ct Abdomen Pelvis W Contrast  10/23/2013   CLINICAL DATA:  Upper abdominal pain, nausea, fever  EXAM: CT ABDOMEN AND PELVIS WITH CONTRAST  TECHNIQUE: Multidetector CT imaging of the abdomen and pelvis was performed using the standard protocol following bolus administration of intravenous  contrast.  CONTRAST:  80mL OMNIPAQUE IOHEXOL 300 MG/ML  SOLN  COMPARISON:  09/24/2009  FINDINGS: Lung bases are unremarkable. There is disc space flattening with mild posterior disc bulge at L5-S1 level. Mild hepatic fatty infiltration. The liver shows no focal mass. No intrahepatic biliary ductal dilatation. No calcified gallstones are noted within gallbladder. The pancreas, spleen and adrenal glands are unremarkable.  Kidneys are symmetrical in size and enhancement. No hydronephrosis or hydroureter. No focal renal mass. No aortic aneurysm.  No pericecal inflammation. Normal appendix. The terminal ileum is unremarkable.  Few diverticula are noted descending colon. No evidence of acute diverticulitis.  In axial image 62. There is focal concentric thickening of the proximal sigmoid colon wall. This is best seen in coronal image 27 and twenty-eighth. This is highly suspicious for colonic mass. Further correlation with colonoscopy is recommended. There is a mesenteric lymph node just inferior to sigmoid colon at this level measures 7 mm. This is suspicious for metastatic disease.  Prostate gland and seminal vesicles are unremarkable. Small left inguinal scrotal canal hernia containing fat without evidence of acute complication. No destructive bony lesions are noted within pelvis.  Delayed renal images shows bilateral renal symmetrical excretion. Bilateral visualized proximal ureter is unremarkable.  IMPRESSION: 1. There is abnormal focal thickening of colonic wall in proximal sigmoid colon axial images 61 and 62. These are suspicious for colonic mass. Further correlation with colonoscopy is recommended. Few colonic diverticula are noted left colon and sigmoid colon. No evidence of acute diverticulitis. 2. There is a mesenteric lymph node just inferior to sigmoid colon axial image 66 measures 7 mm. Metastatic disease cannot be excluded. 3. No hydronephrosis or hydroureter. 4. No small bowel obstruction. 5. These results  were called by telephone at the time of interpretation on 10/23/2013 at 12:53 pm to Dr. Samuel Jester , who verbally acknowledged these results.   Electronically Signed   By: Natasha Mead M.D.   On: 10/23/2013 12:54   US Abdomen Limited  10/23/2013   CLINICAL DATA:  Abdominal pain.  EXAM: US ABDOMEN LIMITED - RIGHT UPPER QUADRANT  COMPARISON:  CT scan of same day.  FINDINGS: Gallbladder:  No gallstones or wall thickening visualized. No sonographic Murphy sign noted. Multiple gallbladder polyps are noted with the largest measuring 9 mm.  Common bile duct:  Diameter: Measures 3.7 mm which is within normal limits.  Liver:  Fatty infiltration of the liver is noted with sparing around the gallbladder fossa.  IMPRESSION: Fatty infiltration of the liver. No cholelithiasis is noted. Multiple gallbladder polyps are noted with the largest measuring 9 mm; followup ultrasound in 1 year is recommended to ensure stability and rule out neoplasm.   Electronically Signed   By: Roque Lias M.D.   On: 10/23/2013 19:47   Other results:  EKG: Normal sinus bradycardia at 50 beats per minute, right axis deviation, normal intervals,  no LVH by voltage, no significant Q waves, good R wave progression, no ST segment or T wave changes.  Assessment & Plan by Problem:  [name] is a 53 year old man with a history of diabetes and gallbladder polyps who presents with the acute onset of severe right upper quadrant pain. He had a similarly severe bout in February 2015 and has had milder episodes intermittently since, with the evaluation to date being unremarkable with the exception of the gallbladder polyps. I am concerned that his pain may be secondary to obstruction of the gallbladder orifice intermittently by the large polyp near the neck of the gallbladder when the gallbladder contracts in response to certain foods. Once this obstruction is relieved his pain disappears. I am not sure there is a definitive way to make  this diagnosis. It may be necessary to perform a cholecystectomy to remove the gallbladder with the polyps and assess his symptomatic response. Fortunately, the possible sigmoid mass seen on CT scan was only thickening of the sigmoid folds related to the mild diverticulosis.  1) Right upper quadrant pain: Felt to be secondary to obstruction of the gallbladder orifice by a gallbladder polyp upon contraction of the gallbladder. He currently is completely asymptomatic, suggesting that the polyp is no longer obstructing the orifice. He is stable for discharge home with followup in Dr. Haywood Pao GI clinic in one month. His primary care provider could also consider referring him to general surgery to assess for a laparoscopic cholecystectomy. If this procedure is done he then can be followed symptomatically thereafter.  2) Disposition: He is stable for discharge home today with followup as outlined above.

## 2013-10-24 NOTE — Progress Notes (Signed)
Please see my H&P from earlier today for the specifics of my evaluation, assessment, and plan.

## 2013-10-24 NOTE — Interval H&P Note (Signed)
History and Physical Interval Note:  10/24/2013 9:26 AM  Joseph Roberts  has presented today for surgery, with the diagnosis of Sigmoid colon mass.  The various methods of treatment have been discussed with the patient and family. After consideration of risks, benefits and other options for treatment, the patient has consented to  Procedure(s): COLONOSCOPY (N/A) as a surgical intervention .  The patient's history has been reviewed, patient examined, no change in status, stable for surgery.  I have reviewed the patient's chart and labs.  Questions were answered to the patient's satisfaction.     Venita LickMalcolm T. Russella DarStark MD

## 2013-10-24 NOTE — Discharge Instructions (Signed)
·   Thank you for allowing [place] to be involved in your healthcare while you were hospitalized at [facility].   Please note that there have been changes to your home medications.  --> PLEASE LOOK AT YOUR DISCHARGE MEDICATION LIST FOR DETAILS.  Please be sure to call Dr Haywood Pao office and Dr Jeanann Lewandowsky, MD for you appointment in the next three days.   Please call your PCP if you have any questions or concerns, or any difficulty getting any of your medications.  Please return to the ER if you have worsening of your symptoms or new severe symptoms arise.

## 2013-10-24 NOTE — Op Note (Signed)
[facility] [address] [place], [id number]   COLONOSCOPY PROCEDURE REPORT  PATIENT: Joseph Roberts, Joseph Roberts  MR#: [id number] BIRTHDATE: [birth year] , 53  yrs. old GENDER: Male ENDOSCOPIST: Meryl DareMalcolm T Rosan Calbert, MD, [facility] REFERRED OZ:HYQMVBY:[facility] PROCEDURE DATE:  10/24/2013 PROCEDURE:   Colonoscopy, diagnostic First Screening Colonoscopy - Avg.  risk and is 50 yrs.  old or older - No.  Prior Negative Screening - Now for repeat screening. N/A  History of Adenoma - Now for follow-up colonoscopy & has been > or = to 3 yrs.  N/A  Polyps Removed Today? No.  Recommend repeat exam, <10 yrs? No. ASA CLASS:   Class II INDICATIONS: an abnormal CT. abd pain. MEDICATIONS: These medications were titrated to patient response per physician's verbal order, Fentanyl 100 mcg IV, and Versed 10 mg IV  DESCRIPTION OF PROCEDURE:   After the risks benefits and alternatives of the procedure were thoroughly explained, informed consent was obtained.  A digital rectal exam revealed no abnormalities of the rectum.   The Pentax Ped Colon [id number] endoscope was introduced through the anus and advanced to the cecum, which was identified by both the appendix and ileocecal valve. No adverse events experienced.   The quality of the prep was good, using MoviPrep  The instrument was then slowly withdrawn as the colon was fully examined.  COLON FINDINGS: Mild diverticulosis was noted in the descending colon and sigmoid colon. Mildly thickened folds with mild erythema noted in the sigmoid that appeared to be typcial diverticular changes.  The colon was otherwise normal.  There was no diverticulosis, inflammation, polyps or cancers unless previously stated.  Retroflexed views revealed small internal hemorrhoids. The time to cecum=2 minutes 00 seconds.  Withdrawal time=9 minutes 00 seconds.  The scope was withdrawn and the procedure completed.  COMPLICATIONS: There were no  complications.  ENDOSCOPIC IMPRESSION: 1.   Mild diverticulosis in the descending colon and sigmoid colon with midly thickened sigmoid colon folds 2.   Small internal hemorrhoids  RECOMMENDATIONS: 1.  Follow-up appointment with Dr. Elnoria HowardHung in 1 month 2.  Follow colorectal cancer screening guidelines for "routine risk" patients with a repeat colonoscopy in 10 years.  There is no need for routine, screening FOBT (stool) testing for at least 5 years. 3.  High fiber diet with liberal fluid intake.  eSigned:  Meryl DareMalcolm T Beverley Allender, MD, [facility] 10/24/2013 10:04 AM

## 2013-10-24 NOTE — H&P (View-Only) (Signed)
Unassigned Consult  Reason for Consult: Sigmoid colon mass. Referring Physician: Teaching Service  Darleene CleaverJuan Granados-Samperio HPI: This is a 53 year old male who presents with complaints of abdominal pain.  He speaks broken [place], but Iam able to obtain a reasonable history.  He states that he has this pain for the past two years and it is in this abdomen.  I do not know why he had an acute worsening of his symptoms, but this is the reason that he presented to the hospital. In the past a CT scan revealed that he had a jejunitis in 2011, but he cannot remember how it ultimately resolved.  No reports of hematochezia, melena, diarrhea, or constipation.  There is no known family history of colon cancer.   A CT scan revealed a possible sigmoid colon mass and he denies any prior colonoscopies.  Past Medical History  Diagnosis Date  . Diabetes mellitus     History reviewed. No pertinent past surgical history.  Family History  Problem Relation Age of Onset  . Diabetes Mother   . Diabetes Sister   . Diabetes Brother     Social History:  reports that he has been smoking Cigarettes.  He has a 15 pack-year smoking history. He has never used smokeless tobacco. He reports that he does not drink alcohol or use illicit drugs.  Allergies: No Known Allergies  Medications:  Scheduled: . insulin aspart  0-9 Units Subcutaneous 6 times per day  . insulin glargine  10 Units Subcutaneous QHS  . [START ON 10/24/2013] pneumococcal 23 valent vaccine  0.5 mL Intramuscular Tomorrow-1000  . [DISCONTINUED] sodium chloride   Intravenous STAT   Continuous: . sodium chloride 100 mL/hr at 10/23/13 1029    Results for orders placed during the hospital encounter of 10/23/13 (from the past 24 hour(s))  URINALYSIS, ROUTINE W REFLEX MICROSCOPIC     Status: Abnormal   Collection Time    10/23/13  8:38 AM      Result Value Ref Range   Color, Urine YELLOW  YELLOW   APPearance CLEAR  CLEAR   Specific Gravity, Urine  1.016  1.005 - 1.030   pH 5.0  5.0 - 8.0   Glucose, UA NEGATIVE  NEGATIVE mg/dL   Hgb urine dipstick NEGATIVE  NEGATIVE   Bilirubin Urine NEGATIVE  NEGATIVE   Ketones, ur NEGATIVE  NEGATIVE mg/dL   Protein, ur 30 (*) NEGATIVE mg/dL   Urobilinogen, UA 0.2  0.0 - 1.0 mg/dL   Nitrite NEGATIVE  NEGATIVE   Leukocytes, UA NEGATIVE  NEGATIVE  URINE MICROSCOPIC-ADD ON     Status: None   Collection Time    10/23/13  8:38 AM      Result Value Ref Range   Squamous Epithelial / LPF RARE  RARE   WBC, UA 0-2  <3 WBC/hpf   RBC / HPF 0-2  <3 RBC/hpf   Bacteria, UA RARE  RARE   Urine-Other MUCOUS PRESENT    COMPREHENSIVE METABOLIC PANEL     Status: Abnormal   Collection Time    10/23/13  9:00 AM      Result Value Ref Range   Sodium 136 (*) 137 - 147 mEq/L   Potassium 4.4  3.7 - 5.3 mEq/L   Chloride 104  96 - 112 mEq/L   CO2 18 (*) 19 - 32 mEq/L   Glucose, Bld 152 (*) 70 - 99 mg/dL   BUN 10  6 - 23 mg/dL   Creatinine, Ser 1.610.59  0.50 -  1.35 mg/dL   Calcium 9.4  8.4 - 16.1 mg/dL   Total Protein 7.7  6.0 - 8.3 g/dL   Albumin 3.8  3.5 - 5.2 g/dL   AST 28  0 - 37 U/L   ALT 28  0 - 53 U/L   Alkaline Phosphatase 99  39 - 117 U/L   Total Bilirubin 0.5  0.3 - 1.2 mg/dL   GFR calc non Af Amer >90  >90 mL/min   GFR calc Af Amer >90  >90 mL/min   Anion gap 14  5 - 15  CBC WITH DIFFERENTIAL     Status: None   Collection Time    10/23/13  9:00 AM      Result Value Ref Range   WBC 10.0  4.0 - 10.5 K/uL   RBC 5.48  4.22 - 5.81 MIL/uL   Hemoglobin 16.9  13.0 - 17.0 g/dL   HCT 09.6  04.5 - 40.9 %   MCV 88.0  78.0 - 100.0 fL   MCH 30.8  26.0 - 34.0 pg   MCHC 35.1  30.0 - 36.0 g/dL   RDW 81.1  91.4 - 78.2 %   Platelets 191  150 - 400 K/uL   Neutrophils Relative % 61  43 - 77 %   Neutro Abs 6.2  1.7 - 7.7 K/uL   Lymphocytes Relative 29  12 - 46 %   Lymphs Abs 2.9  0.7 - 4.0 K/uL   Monocytes Relative 6  3 - 12 %   Monocytes Absolute 0.6  0.1 - 1.0 K/uL   Eosinophils Relative 3  0 - 5 %    Eosinophils Absolute 0.3  0.0 - 0.7 K/uL   Basophils Relative 1  0 - 1 %   Basophils Absolute 0.1  0.0 - 0.1 K/uL  LIPASE, BLOOD     Status: None   Collection Time    10/23/13  9:03 AM      Result Value Ref Range   Lipase 46  11 - 59 U/L  TROPONIN I     Status: None   Collection Time    10/23/13  9:05 AM      Result Value Ref Range   Troponin I <0.30  <0.30 ng/mL  GLUCOSE, CAPILLARY     Status: Abnormal   Collection Time    10/23/13  4:04 PM      Result Value Ref Range   Glucose-Capillary 117 (*) 70 - 99 mg/dL     Dg Chest 2 View  9/56/2130   CLINICAL DATA:  Chest and abdominal pain  EXAM: CHEST  2 VIEW  COMPARISON:  None.  FINDINGS: Subsegmental atelectasis at the lung bases. Normal heart size. Upper lungs clear. No pneumothorax or pleural effusion  IMPRESSION: No active cardiopulmonary disease.   Electronically Signed   By: Maryclare Bean M.D.   On: 10/23/2013 10:08   Ct Abdomen Pelvis W Contrast  10/23/2013   CLINICAL DATA:  Upper abdominal pain, nausea, fever  EXAM: CT ABDOMEN AND PELVIS WITH CONTRAST  TECHNIQUE: Multidetector CT imaging of the abdomen and pelvis was performed using the standard protocol following bolus administration of intravenous contrast.  CONTRAST:  80mL OMNIPAQUE IOHEXOL 300 MG/ML  SOLN  COMPARISON:  09/24/2009  FINDINGS: Lung bases are unremarkable. There is disc space flattening with mild posterior disc bulge at L5-S1 level. Mild hepatic fatty infiltration. The liver shows no focal mass. No intrahepatic biliary ductal dilatation. No calcified gallstones are noted within gallbladder. The pancreas,  spleen and adrenal glands are unremarkable.  Kidneys are symmetrical in size and enhancement. No hydronephrosis or hydroureter. No focal renal mass. No aortic aneurysm.  No pericecal inflammation. Normal appendix. The terminal ileum is unremarkable.  Few diverticula are noted descending colon. No evidence of acute diverticulitis.  In axial image 62. There is focal concentric  thickening of the proximal sigmoid colon wall. This is best seen in coronal image 27 and twenty-eighth. This is highly suspicious for colonic mass. Further correlation with colonoscopy is recommended. There is a mesenteric lymph node just inferior to sigmoid colon at this level measures 7 mm. This is suspicious for metastatic disease.  Prostate gland and seminal vesicles are unremarkable. Small left inguinal scrotal canal hernia containing fat without evidence of acute complication. No destructive bony lesions are noted within pelvis.  Delayed renal images shows bilateral renal symmetrical excretion. Bilateral visualized proximal ureter is unremarkable.  IMPRESSION: 1. There is abnormal focal thickening of colonic wall in proximal sigmoid colon axial images 61 and 62. These are suspicious for colonic mass. Further correlation with colonoscopy is recommended. Few colonic diverticula are noted left colon and sigmoid colon. No evidence of acute diverticulitis. 2. There is a mesenteric lymph node just inferior to sigmoid colon axial image 66 measures 7 mm. Metastatic disease cannot be excluded. 3. No hydronephrosis or hydroureter. 4. No small bowel obstruction. 5. These results were called by telephone at the time of interpretation on 10/23/2013 at 12:53 pm to Dr. Samuel Jester , who verbally acknowledged these results.   Electronically Signed   By: Natasha Mead M.D.   On: 10/23/2013 12:54    ROS:  As stated above in the HPI otherwise negative.  Blood pressure 119/70, pulse 54, temperature 97.9 F (36.6 C), temperature source Oral, resp. rate 16, height 5' 4.17" (1.63 m), weight 180 lb 12.4 oz (82 kg), SpO2 99.00%.    PE: Gen: NAD, Alert and Oriented HEENT:  [place]/AT, EOMI Neck: Supple, no LAD Lungs: CTA Bilaterally CV: RRR without M/G/R ABM: Soft, NTND, +BS, no reproducible pain at this time. Ext: No C/C/E  Assessment/Plan: 1) Sigmoid colon thickening/mass. 2) Sigmoid diverticula. 3) ABM pain.   His  physical examination was rather benign.  In comparison to his CT scan in 2011 there is a change in the sigmoid region.  Radiology feels that there may be a mass.  It is unusual for a colon cancer to present with abdominal pain and the site of his pain does not seem to correlate with the sigmoid region. He has diverticula, but there is no evidence of a diverticulitis.  He is over the age of 73 and he has never had a colonoscopy and further evaluation is required.  Plan: 1) Colonoscopy tomorrow.  Brixon Zhen D 10/23/2013, 4:39 PM

## 2013-10-24 NOTE — Discharge Summary (Signed)
Name: Joseph Roberts MRN: [id number] DOB: 1960/12/23 53 y.o. PCP: Joseph Lewandowskylugbemiga Jegede, MD  Date of Admission: 10/23/2013  8:48 AM Date of Discharge: 10/24/2013 Attending Physician: Rocco SereneLawrence Roberts Klima, MD  Discharge Diagnosis: Principal Problem:   Colonic mass Active Problems:   Type 2 diabetes mellitus   Metabolic acidosis, normal anion gap (NAG)   Abdominal pain, right upper quadrant   Nonspecific (abnormal) findings on radiological and other examination of gastrointestinal tract   Gall bladder polyp   GERD (gastroesophageal reflux disease)  Discharge Medications:   Medication List         glipiZIDE 5 MG tablet  Commonly known as:  GLUCOTROL  Take 1 tablet (5 mg total) by mouth 2 (two) times daily before a meal.     metFORMIN 500 MG tablet  Commonly known as:  GLUCOPHAGE  Take 1 tablet (500 mg total) by mouth 2 (two) times daily with a meal.     pantoprazole 40 MG tablet  Commonly known as:  PROTONIX  Take 1 tablet (40 mg total) by mouth daily.        Disposition and follow-up:   [name] [facility].  At the hospital follow up visit please address:  1.  Consider referral to outpatient general surgery for consideration of laparascopic cholecystectomy as this might be the answer for his recurrent pain (see hospital course for details)  2.  Labs / imaging needed at time of follow-up: none  3.  Pending labs/ test needing follow-up: none  Follow-up Appointments:     Follow-up Information   Follow up with Joseph D, MD In 2 days.   Specialty:  Gastroenterology   Contact information:   [address], [address] [place] [address] [phone]       Follow up with Joseph LewandowskyJEGEDE, OLUGBEMIGA, MD In 2 days.   Specialty:  Internal Medicine   Contact information:   [address] [place] [address] [phone]       Discharge Instructions: Discharge Instructions   Call  MD for:  persistant nausea and vomiting    Complete by:  As directed      Call MD for:  severe uncontrolled pain    Complete by:  As directed      Call MD for:  temperature >100.4    Complete by:  As directed      Diet - low sodium heart healthy    Complete by:  As directed      Increase activity slowly    Complete by:  As directed            Consultations: Treatment Team:  Theda BelfastPatrick Roberts Hung, MD  Procedures Performed:  Dg Chest 2 View  10/23/2013   CLINICAL DATA:  Chest and abdominal pain  EXAM: CHEST  2 VIEW  COMPARISON:  None.  FINDINGS: Subsegmental atelectasis at the lung bases. Normal heart size. Upper lungs clear. No pneumothorax or pleural effusion  IMPRESSION: No active cardiopulmonary disease.   Electronically Signed   By: Joseph BeanArt  Hoss M.Roberts.   On: 10/23/2013 10:08   Ct Abdomen Pelvis W Contrast  10/23/2013   CLINICAL DATA:  Upper abdominal pain, nausea, fever  EXAM: CT ABDOMEN AND PELVIS WITH CONTRAST  TECHNIQUE: Multidetector CT imaging of the abdomen and pelvis was performed using the standard protocol following bolus administration of intravenous contrast.  CONTRAST:  80mL OMNIPAQUE IOHEXOL 300 MG/ML  SOLN  COMPARISON:  09/24/2009  FINDINGS: Lung bases are  unremarkable. There is disc space flattening with mild posterior disc bulge at L5-S1 level. Mild hepatic fatty infiltration. The liver shows no focal mass. No intrahepatic biliary ductal dilatation. No calcified gallstones are noted within gallbladder. The pancreas, spleen and adrenal glands are unremarkable.  Kidneys are symmetrical in size and enhancement. No hydronephrosis or hydroureter. No focal renal mass. No aortic aneurysm.  No pericecal inflammation. Normal appendix. The terminal ileum is unremarkable.  Few diverticula are noted descending colon. No evidence of acute diverticulitis.  In axial image 62. There is focal concentric thickening of the proximal sigmoid colon wall. This is best seen in coronal image 27 and  twenty-eighth. This is highly suspicious for colonic mass. Further correlation with colonoscopy is recommended. There is a mesenteric lymph node just inferior to sigmoid colon at this level measures 7 mm. This is suspicious for metastatic disease.  Prostate gland and seminal vesicles are unremarkable. Small left inguinal scrotal canal hernia containing fat without evidence of acute complication. No destructive bony lesions are noted within pelvis.  Delayed renal images shows bilateral renal symmetrical excretion. Bilateral visualized proximal ureter is unremarkable.  IMPRESSION: 1. There is abnormal focal thickening of colonic wall in proximal sigmoid colon axial images 61 and 62. These are suspicious for colonic mass. Further correlation with colonoscopy is recommended. Few colonic diverticula are noted left colon and sigmoid colon. No evidence of acute diverticulitis. 2. There is a mesenteric lymph node just inferior to sigmoid colon axial image 66 measures 7 mm. Metastatic disease cannot be excluded. 3. No hydronephrosis or hydroureter. 4. No small bowel obstruction. 5. These results were called by telephone at the time of interpretation on 10/23/2013 at 12:53 pm to Dr. Samuel Roberts , who verbally acknowledged these results.   Electronically Signed   By: Natasha Mead M.Roberts.   On: 10/23/2013 12:54   US Abdomen Limited  10/23/2013   CLINICAL DATA:  Abdominal pain.  EXAM: US ABDOMEN LIMITED - RIGHT UPPER QUADRANT  COMPARISON:  CT scan of same day.  FINDINGS: Gallbladder:  No gallstones or wall thickening visualized. No sonographic Murphy sign noted. Multiple gallbladder polyps are noted with the largest measuring 9 mm.  Common bile duct:  Diameter: Measures 3.7 mm which is within normal limits.  Liver:  Fatty infiltration of the liver is noted with sparing around the gallbladder fossa.  IMPRESSION: Fatty infiltration of the liver. No cholelithiasis is noted. Multiple gallbladder polyps are noted with the largest  measuring 9 mm; followup ultrasound in 1 year is recommended to ensure stability and rule out neoplasm.   Electronically Signed   By: Roque Lias M.Roberts.   On: 10/23/2013 19:47     Admission HPI:  Chief Complaint:  Abdominal pain  History of Present Illness:  Kit Mollett is a 53 year old male with PMH of DM and gallbladder polyps who presents with severe abdominal pain and nausea.  He describes the pain as severe, constant RUQ abdominal and back pain that started at 3 am this morning, and he was unable to complete his shift at work. He reports the pain is worse after he eats spicy foods with no alleviating factors since the start of the severe pain. Last bowel movement was around 2 am this morning and was unchanged from prior bowel movements. He reports his bowel movements as soft and non-bloody without any changes in size or consistency. Describes an increase in fatigue over the past 2-3 months and endorses weight gain. He has never had a  colonoscopy. He reports experiencing the same RUQ pain in February, which was also noted in a PCP visit in February 2015. A RUQ ultrasound was performed during the same month that showed polyps in his gallbladder, with one polyp of 1 cm in size located near the gallbladder neck. Denies urinary issues, dyspnea, chest tightness, fevers, chills, vomiting, change in appetite, blood in stool, thin stools, or family history of cancer. The history of communicated through a telephone interpreter.   Hospital Course by problem list: Principal Problem:   Colonic mass Active Problems:   Type 2 diabetes mellitus   Metabolic acidosis, normal anion gap (NAG)   Abdominal pain, right upper quadrant   Nonspecific (abnormal) findings on radiological and other examination of gastrointestinal tract   Gall bladder polyp   GERD (gastroesophageal reflux disease)   Right Upper Quandrant Pain: Appears a recurrent problem. This episode of pain had resolved by the time of  discharge. LFTs were normal. Abdominal CT scan did not identify complications of acute cholecystitis, such as perforation, abscess, or pancreatitis. U/S of RUQ revealed fatty infiltration of the liver without cholelithiasis. However, there were multiple gallbladder polyps are noted with the largest measuring 9 mm.  He had a similarly severe bout in February 2015 and has had milder episodes intermittently since, with the evaluation to date being unremarkable with the exception of the gallbladder polyps. We were concerned that his pain may be secondary to obstruction of the gallbladder orifice intermittently by the large polyp near the neck of the gallbladder when the gallbladder contracts in response to certain foods. Once this obstruction is relieved his pain disappears. This is plausible especially given that one polyp was very close to the gallbladder orifice. Another possibility is GERD. The patient reports that he has been previously treated with Zantac but this was not very helpful according to his report. We, therefore, discharged him on a PPI thinking that this might be more helpful in controlling his symptoms compared to an H-2 antagonist. Dr Hyman Hopes, his primary care provider could also consider referring him to general surgery to assess for a laparoscopic cholecystectomy. If this procedure is done he then can be followed symptomatically thereafter. I will route this note to Dr Hyman Hopes.   Colonic wall thickening seen on abdominal CT: Abdominal CT showed abnormal focal thickening of colonic wall in proximal sigmoid colon and a single mesenteric lymph node of 7mm inferior to sigmoid colon. Colonoscopy is normal besides mild diverticulosis in the descending colon and sigmoid colon with midly thickened sigmoid colon folds. There were also small internal hemorrhoids noted. GI recommended to follow colorectal cancer screening guidelines for "routine risk" with a repeat colonoscopy in 10 years. He will follow up  with Dr Elnoria Howard in one month.   Diabetes Mellitus type 2: Last HgbA1c 9.8 in 04/06/2013. He was discharged on his home glipizide and metformin.    Discharge Vitals:   BP 127/77  Pulse 45  Temp(Src) 97.5 F (36.4 C) (Oral)  Resp 18  Ht 5' 4.17" (1.63 m)  Wt 180 lb 12.4 oz (82 kg)  BMI 30.86 kg/m2  SpO2 97%  Discharge Labs:  Results for orders placed during the hospital encounter of 10/23/13 (from the past 24 hour(s))  GLUCOSE, CAPILLARY     Status: Abnormal   Collection Time    10/23/13  4:04 PM      Result Value Ref Range   Glucose-Capillary 117 (*) 70 - 99 mg/dL  GLUCOSE, CAPILLARY     Status: Abnormal  Collection Time    10/23/13  8:41 PM      Result Value Ref Range   Glucose-Capillary 144 (*) 70 - 99 mg/dL  GLUCOSE, CAPILLARY     Status: Abnormal   Collection Time    10/23/13 11:59 PM      Result Value Ref Range   Glucose-Capillary 133 (*) 70 - 99 mg/dL  GLUCOSE, CAPILLARY     Status: Abnormal   Collection Time    10/24/13  4:54 AM      Result Value Ref Range   Glucose-Capillary 123 (*) 70 - 99 mg/dL  COMPREHENSIVE METABOLIC PANEL     Status: Abnormal   Collection Time    10/24/13  5:30 AM      Result Value Ref Range   Sodium 142  137 - 147 mEq/L   Potassium 4.0  3.7 - 5.3 mEq/L   Chloride 107  96 - 112 mEq/L   CO2 22  19 - 32 mEq/L   Glucose, Bld 123 (*) 70 - 99 mg/dL   BUN 6  6 - 23 mg/dL   Creatinine, Ser 1.61  0.50 - 1.35 mg/dL   Calcium 8.9  8.4 - 09.6 mg/dL   Total Protein 6.4  6.0 - 8.3 g/dL   Albumin 3.2 (*) 3.5 - 5.2 g/dL   AST 21  0 - 37 U/L   ALT 23  0 - 53 U/L   Alkaline Phosphatase 88  39 - 117 U/L   Total Bilirubin 0.7  0.3 - 1.2 mg/dL   GFR calc non Af Amer >90  >90 mL/min   GFR calc Af Amer >90  >90 mL/min   Anion gap 13  5 - 15  CBC     Status: None   Collection Time    10/24/13  5:30 AM      Result Value Ref Range   WBC 7.0  4.0 - 10.5 K/uL   RBC 5.14  4.22 - 5.81 MIL/uL   Hemoglobin 15.3  13.0 - 17.0 g/dL   HCT 04.5  40.9 - 81.1 %    MCV 85.8  78.0 - 100.0 fL   MCH 29.8  26.0 - 34.0 pg   MCHC 34.7  30.0 - 36.0 g/dL   RDW 91.4  78.2 - 95.6 %   Platelets 184  150 - 400 K/uL  HEMOGLOBIN A1C     Status: Abnormal   Collection Time    10/24/13  5:30 AM      Result Value Ref Range   Hemoglobin A1C 6.9 (*) <5.7 %   Mean Plasma Glucose 151 (*) <117 mg/dL  GLUCOSE, CAPILLARY     Status: Abnormal   Collection Time    10/24/13  8:12 AM      Result Value Ref Range   Glucose-Capillary 130 (*) 70 - 99 mg/dL  GLUCOSE, CAPILLARY     Status: Abnormal   Collection Time    10/24/13  9:59 AM      Result Value Ref Range   Glucose-Capillary 106 (*) 70 - 99 mg/dL  GLUCOSE, CAPILLARY     Status: Abnormal   Collection Time    10/24/13 12:35 PM      Result Value Ref Range   Glucose-Capillary 111 (*) 70 - 99 mg/dL    Signed: Dow Adolph, MD 10/24/2013, 2:38 PM    Services Ordered on Discharge: none Equipment Ordered on Discharge: none

## 2013-10-25 ENCOUNTER — Other Ambulatory Visit: Payer: Self-pay | Admitting: Internal Medicine

## 2013-10-25 DIAGNOSIS — K824 Cholesterolosis of gallbladder: Secondary | ICD-10-CM

## 2013-10-25 NOTE — Discharge Summary (Signed)
I agree with Dr. Huntley Dec discharge summary with the following modification:  Discharge Diagnosis:   Principal Problem:   RUQ Pain: Likely secondary to intermittent gallbladder orifice obstruction from gallbladder polyp   Active Problems:   Type 2 diabetes mellitus  GERD (gastroesophageal reflux disease)

## 2013-10-26 ENCOUNTER — Encounter (HOSPITAL_COMMUNITY): Payer: Self-pay | Admitting: Gastroenterology

## 2013-11-02 ENCOUNTER — Encounter: Payer: Self-pay | Admitting: Family Medicine

## 2013-11-02 ENCOUNTER — Ambulatory Visit: Payer: 59 | Attending: Family Medicine | Admitting: Family Medicine

## 2013-11-02 VITALS — BP 127/78 | HR 82 | Temp 98.1°F | Resp 18 | Ht 64.17 in | Wt 184.0 lb

## 2013-11-02 DIAGNOSIS — F17200 Nicotine dependence, unspecified, uncomplicated: Secondary | ICD-10-CM | POA: Diagnosis not present

## 2013-11-02 DIAGNOSIS — K824 Cholesterolosis of gallbladder: Secondary | ICD-10-CM | POA: Insufficient documentation

## 2013-11-02 DIAGNOSIS — R1011 Right upper quadrant pain: Secondary | ICD-10-CM

## 2013-11-02 DIAGNOSIS — R1031 Right lower quadrant pain: Secondary | ICD-10-CM | POA: Diagnosis present

## 2013-11-02 DIAGNOSIS — K6389 Other specified diseases of intestine: Secondary | ICD-10-CM

## 2013-11-02 DIAGNOSIS — J029 Acute pharyngitis, unspecified: Secondary | ICD-10-CM | POA: Insufficient documentation

## 2013-11-02 DIAGNOSIS — K648 Other hemorrhoids: Secondary | ICD-10-CM | POA: Insufficient documentation

## 2013-11-02 DIAGNOSIS — R7309 Other abnormal glucose: Secondary | ICD-10-CM

## 2013-11-02 DIAGNOSIS — E872 Acidosis, unspecified: Secondary | ICD-10-CM

## 2013-11-02 DIAGNOSIS — E1169 Type 2 diabetes mellitus with other specified complication: Secondary | ICD-10-CM

## 2013-11-02 DIAGNOSIS — E785 Hyperlipidemia, unspecified: Secondary | ICD-10-CM

## 2013-11-02 DIAGNOSIS — E119 Type 2 diabetes mellitus without complications: Secondary | ICD-10-CM

## 2013-11-02 DIAGNOSIS — E782 Mixed hyperlipidemia: Secondary | ICD-10-CM

## 2013-11-02 DIAGNOSIS — Z113 Encounter for screening for infections with a predominantly sexual mode of transmission: Secondary | ICD-10-CM

## 2013-11-02 DIAGNOSIS — R739 Hyperglycemia, unspecified: Secondary | ICD-10-CM

## 2013-11-02 LAB — GLUCOSE, POCT (MANUAL RESULT ENTRY): POC Glucose: 177 mg/dl — AB (ref 70–99)

## 2013-11-02 MED ORDER — METFORMIN HCL ER 500 MG PO TB24: 1000.0000 mg | ORAL_TABLET | Freq: Every day | ORAL | Status: DC

## 2013-11-02 NOTE — Assessment & Plan Note (Signed)
A: well controled  Meds: compliance P: Change metformin to XR to prevent diarrhea/gi upset Foot exam done Lipids F/u in 3 months

## 2013-11-02 NOTE — Assessment & Plan Note (Signed)
F/u colonoscopy revealed mild diverticulosis and internal hemorrhoids  No colonic mass

## 2013-11-02 NOTE — Assessment & Plan Note (Signed)
Exam reassuring  Suspect viral etiology Supportive care Reassurance  Call prn fever or worsening pain

## 2013-11-02 NOTE — Assessment & Plan Note (Signed)
A: improving. No red flags. P:  Continue current care F/u as needed

## 2013-11-02 NOTE — Assessment & Plan Note (Addendum)
Screening HIV  Screening HIV negative  

## 2013-11-02 NOTE — Progress Notes (Signed)
HFU abdominal pain

## 2013-11-02 NOTE — Assessment & Plan Note (Addendum)
Resolved on f/u CMP Will do a repeat BMP today  persistenty resolved.

## 2013-11-02 NOTE — Patient Instructions (Signed)
Joseph Roberts,  Gracias por venido hoy.  It was a pleasure meeting you.  I look forward to being your primary doctor.  F/u in 3 months, call for an appointment around Thanksgiving.  You will be contacted with results.   Dr. Armen Pickup

## 2013-11-02 NOTE — Progress Notes (Signed)
   Subjective:    Patient ID: Joseph Roberts, male    DOB: Mar 16, 1960, 53 y.o.   MRN: [id number] CC: HFU abdominal pain, DM2 f/u, establish care with PCP  HPI  1. ED f/u abdominal pain: Patient presented to the ED on 10/23/13 with abdominal pain that was RLQ and radiating to the back. An abdominal ultrasound was obtained and revealed gallbladder polyps. A  CT of the abdomen was obtained and revealed a questionable sigmoid mass. A followup colonoscopy on 10/24/2013 twas negative for mass but did reveal mild diverticulosis in the sigmoid colon and internal hemorrhoids. Since onset he reports improvement in pain. He still has intermittent right upper quadrant and epigastric pain. It is associated with heartburn. It is improved with PPI. He denies nausea, vomiting, fever, diarrhea, constipation, dysuria.  2. CHRONIC DIABETES  Disease Monitoring  Blood Sugar Ranges: does not check   Polyuria: no   Visual problems: no   Medication Compliance: yes  Medication Side Effects  Hypoglycemia: no   Preventitive Health Care  Eye Exam: due   Foot Exam: done today   Diet pattern: eats very well   Exercise: active at work   3. Sore throat: x 5 days. No fever. Dry cough. No treatment. Pain is stable. No sick contacts at home or work.   4. R knee cracking: with mild pain. No swelling. No injury. Kneels at work on foam pad.   Soc hx: current smoker  Review of Systems As per HPI   Objective:   Physical Exam BP 127/78  Pulse 82  Temp(Src) 98.1 F (36.7 C) (Oral)  Resp 18  Ht 5' 4.17" (1.63 m)  Wt 184 lb (83.462 kg)  BMI 31.41 kg/m2  SpO2 97% General appearance: alert, cooperative and no distress Throat: lips, mucosa, and tongue normal; teeth and gums normal, mild anterior adenopathy b/l  Abdomen: soft, non-tender; bowel sounds normal; no masses,  no organomegaly Ext: no edema       Assessment & Plan:

## 2013-11-03 DIAGNOSIS — E1169 Type 2 diabetes mellitus with other specified complication: Secondary | ICD-10-CM | POA: Insufficient documentation

## 2013-11-03 DIAGNOSIS — E782 Mixed hyperlipidemia: Secondary | ICD-10-CM

## 2013-11-03 LAB — HIV ANTIBODY (ROUTINE TESTING W REFLEX): HIV 1&2 Ab, 4th Generation: NONREACTIVE

## 2013-11-03 LAB — BASIC METABOLIC PANEL
BUN: 12 mg/dL (ref 6–23)
CHLORIDE: 103 meq/L (ref 96–112)
CO2: 24 meq/L (ref 19–32)
Calcium: 9.8 mg/dL (ref 8.4–10.5)
Creat: 0.7 mg/dL (ref 0.50–1.35)
GLUCOSE: 170 mg/dL — AB (ref 70–99)
POTASSIUM: 3.8 meq/L (ref 3.5–5.3)
Sodium: 135 mEq/L (ref 135–145)

## 2013-11-03 LAB — LIPID PANEL
CHOL/HDL RATIO: 8.1 ratio
Cholesterol: 218 mg/dL — ABNORMAL HIGH (ref 0–200)
HDL: 27 mg/dL — ABNORMAL LOW (ref >39)
Triglycerides: 1379 mg/dL — ABNORMAL HIGH (ref <150)

## 2013-11-03 MED ORDER — ATORVASTATIN CALCIUM 40 MG PO TABS: 40.0000 mg | ORAL_TABLET | Freq: Every day | ORAL | Status: DC

## 2013-11-03 NOTE — Assessment & Plan Note (Signed)
A: in diabetes with high CVD risk  P:  Start lipitor 40

## 2013-11-03 NOTE — Addendum Note (Signed)
Addended by: Dessa Phi on: 11/03/2013 02:09 PM   Modules accepted: Orders

## 2013-11-04 ENCOUNTER — Telehealth: Payer: Self-pay | Admitting: *Deleted

## 2013-11-04 NOTE — Telephone Encounter (Signed)
Message copied by Dyann Kief on Wed Nov 04, 2013  5:51 PM ------      Message from: Dessa Phi      Created: Tue Nov 03, 2013  2:10 PM       Please inform patient.      Normal non fasting BMP, negative HIV,       Elevated triglycerides and low HDL in diabetes. Patient at high risk for heart disease/stroke based on labs and medical problems please start lipitor 40 mg daily, sent to onsite pharmacy ------

## 2013-11-04 NOTE — Telephone Encounter (Signed)
Pt aware of lab results, will pick up Rx Lipitor tomorrow

## 2014-02-04 ENCOUNTER — Encounter: Payer: Self-pay | Admitting: Family Medicine

## 2014-02-04 ENCOUNTER — Ambulatory Visit: Payer: 59 | Attending: Family Medicine | Admitting: Family Medicine

## 2014-02-04 ENCOUNTER — Ambulatory Visit (HOSPITAL_BASED_OUTPATIENT_CLINIC_OR_DEPARTMENT_OTHER): Payer: 59 | Admitting: *Deleted

## 2014-02-04 VITALS — BP 115/76 | HR 74 | Temp 97.7°F | Resp 16 | Ht 64.0 in | Wt 176.0 lb

## 2014-02-04 DIAGNOSIS — E119 Type 2 diabetes mellitus without complications: Secondary | ICD-10-CM | POA: Diagnosis present

## 2014-02-04 DIAGNOSIS — F17200 Nicotine dependence, unspecified, uncomplicated: Secondary | ICD-10-CM | POA: Diagnosis not present

## 2014-02-04 DIAGNOSIS — E782 Mixed hyperlipidemia: Secondary | ICD-10-CM

## 2014-02-04 DIAGNOSIS — Z79899 Other long term (current) drug therapy: Secondary | ICD-10-CM | POA: Insufficient documentation

## 2014-02-04 DIAGNOSIS — R358 Other polyuria: Secondary | ICD-10-CM | POA: Diagnosis not present

## 2014-02-04 DIAGNOSIS — Z23 Encounter for immunization: Secondary | ICD-10-CM

## 2014-02-04 DIAGNOSIS — E786 Lipoprotein deficiency: Secondary | ICD-10-CM

## 2014-02-04 DIAGNOSIS — E781 Pure hyperglyceridemia: Secondary | ICD-10-CM

## 2014-02-04 DIAGNOSIS — M1811 Unilateral primary osteoarthritis of first carpometacarpal joint, right hand: Secondary | ICD-10-CM | POA: Insufficient documentation

## 2014-02-04 DIAGNOSIS — E11349 Type 2 diabetes mellitus with severe nonproliferative diabetic retinopathy without macular edema: Secondary | ICD-10-CM

## 2014-02-04 DIAGNOSIS — E1169 Type 2 diabetes mellitus with other specified complication: Secondary | ICD-10-CM

## 2014-02-04 DIAGNOSIS — M19079 Primary osteoarthritis, unspecified ankle and foot: Secondary | ICD-10-CM | POA: Insufficient documentation

## 2014-02-04 DIAGNOSIS — M1990 Unspecified osteoarthritis, unspecified site: Secondary | ICD-10-CM

## 2014-02-04 LAB — GLUCOSE, POCT (MANUAL RESULT ENTRY): POC GLUCOSE: 261 mg/dL — AB (ref 70–99)

## 2014-02-04 LAB — LIPID PANEL
Cholesterol: 111 mg/dL (ref 0–200)
HDL: 29 mg/dL — ABNORMAL LOW (ref >39)
Total CHOL/HDL Ratio: 3.8 Ratio
Triglycerides: 466 mg/dL — ABNORMAL HIGH (ref <150)

## 2014-02-04 LAB — POCT GLYCOSYLATED HEMOGLOBIN (HGB A1C): Hemoglobin A1C: 8.8

## 2014-02-04 MED ORDER — ACCU-CHEK NANO SMARTVIEW W/DEVICE KIT: 1.0000 | PACK | Status: DC | PRN

## 2014-02-04 MED ORDER — GLUCOSE BLOOD VI STRP: 1.0000 | ORAL_STRIP | Freq: Three times a day (TID) | Status: DC

## 2014-02-04 MED ORDER — SITAGLIPTIN PHOS-METFORMIN HCL 50-1000 MG PO TABS: 1.0000 | ORAL_TABLET | Freq: Two times a day (BID) | ORAL | Status: DC

## 2014-02-04 MED ORDER — ACCU-CHEK FASTCLIX LANCETS MISC: 1.0000 | Freq: Three times a day (TID) | Status: DC

## 2014-02-04 NOTE — Progress Notes (Signed)
   Subjective:    Patient ID: Darleene CleaverJuan Granados-Samperio, male    DOB: Aug 16, 1960, 53 y.o.   MRN: [id number] CC: DM2 f/u   HPI 53 yo M presents for DM2 f/u:  1. CHRONIC DIABETES  Disease Monitoring  Blood Sugar Ranges: yes, 400 yesterday   Polyuria: yes   Visual problems: yes   Medication Compliance: no  Medication Side Effects  Hypoglycemia: no   Preventitive Health Care  Eye Exam: due   Foot Exam: done today   Diet pattern: fair   Exercise: moderate   Soc Hx: current smoker  Review of Systems As per HPI  R thumb pain w/o swelling     Objective:   Physical Exam BP 115/76 mmHg  Pulse 74  Temp(Src) 97.7 F (36.5 C) (Oral)  Resp 16  Ht 5\' 4"  (1.626 m)  Wt 176 lb (79.833 kg)  BMI 30.20 kg/m2  SpO2 99% General appearance: alert, cooperative and no distress Lungs: clear to auscultation bilaterally Heart: regular rate and rhythm, S1, S2 normal, no murmur, click, rub or gallop Extremities: extremities normal, atraumatic, no cyanosis or edema  Lab Results  Component Value Date   HGBA1C 8.8 02/04/2014        Assessment & Plan:

## 2014-02-04 NOTE — Patient Instructions (Addendum)
[name],  Thank you for coming back today.  1. Diabetes: Not heading in the right direction, high sugars. Start janumet (two medications in one pill, twice daily).  Diabetic education.   2. High cholesterol: Rechecking fasting levels Start lipitor daily.   3. Smoking: Start nicotine replacement, gum or lozenge.  F/u in 3 weeks with RN  F/u in 6 weeks with me  Dr. Armen PickupFunches

## 2014-02-04 NOTE — Progress Notes (Signed)
F/U DM Stated glucose was running 400 yesterday non fasting Complaining of sore throat and cough x2 month

## 2014-02-05 LAB — MICROALBUMIN / CREATININE URINE RATIO
Creatinine, Urine: 418 mg/dL
MICROALB UR: 61 mg/dL — AB (ref <2.0)
Microalb Creat Ratio: 145.9 mg/g — ABNORMAL HIGH (ref 0.0–30.0)

## 2014-02-05 NOTE — Progress Notes (Signed)
Prior authorization request form for Janumet faxed to [facility] (Fax [phone]). Awaiting response back from [facility].

## 2014-02-05 NOTE — Assessment & Plan Note (Signed)
A: thumb pain P:  Uric acid

## 2014-02-05 NOTE — Assessment & Plan Note (Signed)
A; rechecking fasting lipids since TGs so elevated P: Continue statin May add lopid for TGs

## 2014-02-05 NOTE — Assessment & Plan Note (Addendum)
A: elevated A1c Meds: partially compliant P: janumet 50-1000 mg BID  Urine ACR F/u in 3 weeks with RN for CBG check  F/u in 6 weeks

## 2014-02-17 ENCOUNTER — Telehealth: Payer: Self-pay | Admitting: *Deleted

## 2014-02-17 ENCOUNTER — Other Ambulatory Visit: Payer: Self-pay | Admitting: *Deleted

## 2014-02-17 DIAGNOSIS — E1169 Type 2 diabetes mellitus with other specified complication: Secondary | ICD-10-CM

## 2014-02-17 DIAGNOSIS — E782 Mixed hyperlipidemia: Principal | ICD-10-CM

## 2014-02-17 MED ORDER — SITAGLIPTIN PHOSPHATE 50 MG PO TABS: 50.0000 mg | ORAL_TABLET | Freq: Two times a day (BID) | ORAL | Status: DC

## 2014-02-17 MED ORDER — METFORMIN HCL 1000 MG PO TABS: 1000.0000 mg | ORAL_TABLET | Freq: Two times a day (BID) | ORAL | Status: DC

## 2014-02-17 MED ORDER — ATORVASTATIN CALCIUM 40 MG PO TABS: 40.0000 mg | ORAL_TABLET | Freq: Every day | ORAL | Status: DC

## 2014-02-17 NOTE — Telephone Encounter (Signed)
-----   Message from Lora PaulaJosalyn C Funches, MD sent at 02/09/2014  8:28 AM EST ----- Fasting lipid profile with better numbers than no fasting still. High TGs low HDL.  Patient to continue lipitor 40 daily with get dLDL at next visit.

## 2014-02-17 NOTE — Telephone Encounter (Signed)
Pt aware of lab results Rx Lipitor was refill [facility]

## 2014-03-16 ENCOUNTER — Encounter: Payer: 59 | Attending: Family Medicine | Admitting: *Deleted

## 2014-03-16 ENCOUNTER — Encounter: Payer: Self-pay | Admitting: *Deleted

## 2014-03-16 VITALS — Ht 64.0 in | Wt 165.6 lb

## 2014-03-16 DIAGNOSIS — Z713 Dietary counseling and surveillance: Secondary | ICD-10-CM | POA: Insufficient documentation

## 2014-03-16 DIAGNOSIS — E11349 Type 2 diabetes mellitus with severe nonproliferative diabetic retinopathy without macular edema: Secondary | ICD-10-CM | POA: Diagnosis not present

## 2014-03-16 NOTE — Progress Notes (Signed)
Diabetes Self-Management Education   Appt. Start Time: 4:30 Appt. End Time: 6:00  03/16/2014  [name], identified by name and date of birth, is a 54 y.o. male with a diagnosis of Diabetes: Type 2.  Other people present during visit:  Interpreter. 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 On occasion he stays overnight. He recently left his glucometer in a motel. I replaced the glucometer with the one that he stated he was using.  One Touch Ultra Mini. Lot [id number] OX Exp: 08/2014 At 4:45 this date I tested Joseph Roberts's glucose result /dl. He stated he had not had anything to eat since 0700 this date. He noted that the last fasting he did was /dl. In review of medical records I noted that new RX was given for Janumet. However the patient continued to take Metformin  BID. I contacted the [facility] and spoke with pharmacy staff. They confirmed that the Janumet was requiring prior authorization and the patient was unable to obtain at this time. They requested that the patient come to the office tomorrow (03/17/13) @ 2:00pm for evaluation and medication management.  ASSESSMENT  Height  (1.626 m), weight 165 lb 9.6 oz (75.116 kg). Body mass index is 28.41 kg/(m^2).  Initial Visit Information:  Are you currently following a meal plan?: No Are you taking your medications as prescribed?: Yes How often do you need to have someone help you when you read instructions, pamphlets, or other written materials from your doctor or pharmacy?: 5 - Always (challeng: speaks spanish, needs translator)  Psychosocial:   Self-care barriers: Other (comment) (Spanish language only) Self-management support: Doctor's office, CDE visits Other persons present: Interpreter Patient Concerns: Glycemic Control Preferred Learning Style: No preference indicated Learning Readiness: Change in progress  Complications:    How often do you check your blood sugar?: 1-2 times/day (Patient lost meter 2 days ago. regularly tests fasting daily) Fasting Blood glucose range (mg/dL): >829 Postprandial Blood glucose range (mg/dL): >562  Diet Intake:  Breakfast: [place]-Bacon, egg, cheese biscuit / coffee cream & splenda Lunch: [employer]: chicken strips, Rice, mashed Potatoes & gravy, biscuit  (78g carbohydrates) Dinner: Sandwich, hame, lettuce, avocado, tomato Beverage(s): 1/2 & 1/2 sweet tea, diluted lemonade  Exercise:  Exercise: ADL's  Individualized Plan for Diabetes Self-Management Training:   Learning Objective:  Patient will have a greater understanding of diabetes self-management. Patient education plan per assessed needs and concerns is to attend individual sessions     Education Topics Reviewed with Patient Today:   Role of diet in the treatment of diabetes and the relationship between the three main macronutrients and blood glucose level, Information on hints to eating out and maintain blood glucose control. Purpose and frequency of SMBG. Relationship between chronic complications and blood glucose control Role of stress on diabetes   PATIENTS GOALS/Plan (Developed by the patient):  Nutrition: General guidelines for healthy choices and portions discussed Monitoring : test my blood glucose as discussed (note x per day with comment) (test fasting after awakening from sleep)  Patient Instructions  Breakfast meal is OK Lunch: do not get combo at [employer]. Consider a salad with meat in it. Dinner: sandwich is OK Always have protein with your carbohydrates Consider [facility] [employer] as a snack, bread with peanutbutter, fruit & boiled egg  Test your glucose fasting which is after you have awakened from your sleep. (regardless what time of day it is) (patient works nights) Go to  Clinic tomorrow at 2:00pm to see Nurse to assist in management of glucose.   Expected Outcomes:   Demonstrated interest in learning. Expect positive outcomes  Education material provided: A1C conversion sheet and Meal plan card  If problems or questions, patient to contact team via:  Phone  Future DSME appointment: PRN

## 2014-03-16 NOTE — Patient Instructions (Signed)
Breakfast meal is OK Lunch: do not get combo at [employer]. Consider a salad with meat in it. Dinner: sandwich is OK Always have protein with your carbohydrates Consider [facility] Bars as a snack, bread with peanutbutter, fruit & boiled egg  Test your glucose fasting which is after you have awakened from your sleep. (regardless what time of day it is) (patient works nights) Go to Clinic tomorrow at 2:00pm to see Nurse to assist in management of glucose.

## 2014-03-17 ENCOUNTER — Encounter: Payer: Self-pay | Admitting: Internal Medicine

## 2014-03-17 ENCOUNTER — Ambulatory Visit: Payer: 59 | Attending: Internal Medicine | Admitting: Internal Medicine

## 2014-03-17 VITALS — BP 120/76 | HR 64 | Temp 98.1°F | Resp 16

## 2014-03-17 DIAGNOSIS — E119 Type 2 diabetes mellitus without complications: Secondary | ICD-10-CM

## 2014-03-17 DIAGNOSIS — Z79899 Other long term (current) drug therapy: Secondary | ICD-10-CM | POA: Diagnosis not present

## 2014-03-17 DIAGNOSIS — E781 Pure hyperglyceridemia: Secondary | ICD-10-CM | POA: Diagnosis not present

## 2014-03-17 DIAGNOSIS — E1165 Type 2 diabetes mellitus with hyperglycemia: Secondary | ICD-10-CM | POA: Insufficient documentation

## 2014-03-17 LAB — GLUCOSE, POCT (MANUAL RESULT ENTRY)
POC Glucose: 265 mg/dl — AB (ref 70–99)
POC Glucose: 331 mg/dl — AB (ref 70–99)

## 2014-03-17 MED ORDER — INSULIN ASPART 100 UNIT/ML ~~LOC~~ SOLN
20.0000 [IU] | Freq: Once | SUBCUTANEOUS | Status: AC
  Administered 2014-03-17: 20 [IU] via SUBCUTANEOUS

## 2014-03-17 MED ORDER — SAXAGLIPTIN HCL 2.5 MG PO TABS: 2.5000 mg | ORAL_TABLET | Freq: Every day | ORAL | Status: DC

## 2014-03-17 NOTE — Progress Notes (Signed)
Patient had appointment with Dr. Hyman HopesJegede today. Informed Dr. Hyman HopesJegede that form for Januvia needs to be reviewed and completed by physician. He indicates he plans to start patient on Onglyza 2.5 mg daily instead of Januvia. Prior authorization for Januvia no longer needed.

## 2014-03-17 NOTE — Patient Instructions (Signed)
Recuento bsico de carbohidratos para la diabetes mellitus (Basic Carbohydrate Counting for Diabetes Mellitus) El recuento de carbohidratos es un mtodo destinado a calcular la cantidad de carbohidratos en la dieta. El consumo de carbohidratos aumenta naturalmente el nivel de azcar (glucosa) en la sangre, por lo que es importante que sepa la cantidad que debe incluir en cada comida. El recuento de carbohidratos ayuda a mantener el nivel de glucosa en la sangre dentro de los lmites normales. La cantidad permitida de carbohidratos es diferente para cada persona. Un nutricionista puede ayudarlo a calcular la cantidad adecuada para usted. Una vez que sepa la cantidad de carbohidratos que puede consumir, podr calcular los carbohidratos de los alimentos que desea comer. Los siguientes alimentos incluyen carbohidratos:  Granos, como panes y cereales.  Frijoles secos y productos con soja.  Vegetales almidonados, como papas, guisantes y maz.  Frutas y jugos de frutas.  Leche y yogur.  Dulces y bocadillos, como pastel, galletas, caramelos, papas fritas de bolsa, refrescos y bebidas frutales con azcar. RECUENTO DE CARBOHIDRATOS Hay dos maneras de calcular los carbohidratos de los alimentos. Puede usar cualquiera de los dos mtodos o una combinacin de ambos. Leer la etiqueta de informacin nutricional de los alimentos envasados La informacin nutricional es una etiqueta incluida en casi todas las bebidas y los alimentos envasados de los [place]. Indica el tamao de la porcin de ese alimento o bebida e informacin sobre los nutrientes de cada porcin, incluso los gramos (g) de carbohidratos por porcin.  Decida la cantidad de porciones que comer o tomar de este alimento o bebida. Multiplique la cantidad de porciones por el nmero de gramos de carbohidratos indicados en la etiqueta para esa porcin. El total ser la cantidad de carbohidratos que consumir al comer ese alimento o tomar esa  bebida. Conocer las porciones estndar de los alimentos Cuando coma alimentos no envasados o que no incluyan la informacin nutricional en la etiqueta, deber medir las porciones para poder calcular la cantidad de carbohidratos. Una porcin de la mayora de los alimentos ricos en carbohidratos contiene alrededor de 15g de carbohidratos. La siguiente lista incluye los tamaos de porcin de los alimentos ricos en carbohidratos que contienen alrededor de 15g de carbohidratos por porcin:   1rebanada de pan (1oz) o 1tortilla de seis pulgadas.  panecillo de hamburguesa o bollito tipo ingls.  4a 6galletas.   de taza de cereal sin azcar y seco.   taza de cereal caliente.   de taza de arroz o pastas.  taza de pur de papas o de una papa grande al horno.  1taza de frutas frescas o una fruta pequea.  taza de frutas o jugo de frutas enlatados o congelados.  1 taza de leche.   de taza de yogur descremado sin ningn agregado o de yogur endulzado con edulcorante artificial.  taza de vegetales almidonados, como guisantes, maz o papas, o de frijoles secos cocidos. Decida la cantidad de porciones estndar que comer. Multiplique la cantidad de porciones por 15 (los gramos de carbohidratos en esa porcin). Por ejemplo, si come 2tazas de fresas, habr comido 2porciones y 30g de carbohidratos (2porciones x 15g = 30g). Para las comidas como sopas y guisos, en las que se mezcla ms de un alimento, deber contar los carbohidratos de cada alimento incluido. EJEMPLO DE RECUENTO DE CARBOHIDRATOS Ejemplo de cena  3 onzas de pechugas de pollo.   de taza de arroz integral.   taza de maz.  1 taza de leche.  1 taza de fresas   con crema batida sin azcar. Clculo de carbohidratos Paso 1: Identifique los alimentos que contienen carbohidratos:   Arroz.  Maz.  Leche.  Fresas. Paso 2: Calcule el nmero de porciones que consumir de cada uno:   2 porciones de  arroz.  1 porcin de maz.  1 porcin de leche.  1 porcin de fresas. Paso 3: Multiplique cada una de esas porciones por 15g:   2 porciones de arroz x 15 g = 30 g.  1 porcin de maz x 15 g = 15 g.  1 porcin de leche x 15 g = 15 g.  1 porcin de fresas x 15 g = 15 g. Paso 4: Sume todas las cantidades para conocer el total de gramos de carbohidratos consumidos: 30 g + 15 g + 15 g + 15 g = 75 g. Document Released: 05/14/2011 Document Revised: 07/06/2013 ExitCare Patient Information 2015 [employer]. This information is not intended to replace advice given to you by your health care provider. Make sure you discuss any questions you have with your health care provider. Diabetes y actividad fsica (Diabetes and Exercise) Hacer actividad fsica con regularidad es muy importante. No se trata solo de perder peso. Tiene muchos otros beneficios, como por ejemplo:  Mejorar el estado fsico, la flexibilidad y la resistencia.  Aumenta la densidad sea.  Ayuda a controlar el peso.  Disminuye la grasa corporal.  Aumenta la fuerza muscular.  Reduce el estrs y las tensiones.  Mejora el estado de salud general. Las personas diabticas que realizan actividad fsica tienen beneficios adicionales debido al ejercicio:  Reduce el apetito.  El organismo mejora el uso del azcar (glucosa) de la sangre.  Ayuda a disminuir o controlar la glucosa en la sangre.  Disminuye la presin arterial.  Ayuda a disminuir los lpidos en la sangre (colesterol y triglicridos).  El organismo mejora el uso de la insulina porque:  Aumenta la sensibilidad del organismo a la insulina.  Reduce las necesidades de insulina del organismo.  Disminuye el riesgo de enfermedad cardaca por la actividad fsica ya que  disminuye el colesterol y tambin los triglicridos.  Aumenta los niveles de colesterol bueno (como las lipoprotenas de alta densidad [HDL]) en el organismo.  Disminuye los niveles de  glucosa en la sangre. SU PLAN DE ACTIVIDAD  Elija una actividad que disfrute y establezca objetivos realistas. Su mdico o educador en diabetes podrn ayudarlo a encontrar una actividad que lo beneficie. Haga ejercicio regularmente como se lo haya indicado el mdico. Esto incluye:  Hacer entrenamiento de resistencia dos veces a la semana, como flexiones, sentadillas, levantar peso o usar bandas de resistencia.  Practicar 150minutos de ejercicios cardiovasculares cada semana, como caminar, correr o hacer algn deporte.  Mantenerse activo y no permanecer inactivo durante ms de 90minutos seguidos. Los perodos cortos de actividad tambin son beneficiosos. Tres sesiones de 10minutos a lo largo del da son tan beneficiosas como una sola sesin de 30minutos. Estas son algunas ideas para los ejercicios:  Lleve a pasear el perro.  Utilice las escaleras en lugar del ascensor.  Baile su cancin favorita.  Haga los ejercicios de un video de ejercicios.  Haga sus ejercicios favoritos con un amigo. RECOMENDACIONES PARA REALIZAR EJERCICIOS CUANDO SE TIENE DIABETES TIPO 1 O TIPO 2   Controle la glucosa en la sangre antes de comenzar. Si el nivel de glucosa en la sangre es de ms de 240 mg/dl, controle las cetonas en la orina. No haga actividad fsica si hay cetonas.  Evite   inyectarse insulina en las zonas del cuerpo que ejercitar. Por ejemplo, evite inyectarse insulina en:  Los brazos, si juega al tenis.  Las piernas, si corre.  Lleve un registro de:  Los alimentos que consume antes y despus de hacer el ejercicio.  Los momentos esperables de picos de accin de la insulina.  Los niveles de glucosa en la sangre antes y despus de hacer ejercicios.  El tipo y cantidad de actividad fsica que realiza.  Revise los registros con su mdico. El mdico lo ayudar a desarrollar pautas para ajustar la cantidad de alimento y las cantidades de insulina antes y despus de hacer ejercicios.  Si  toma insulina o agentes hipoglucemiantes por va oral, observe si hay signos y sntomas de hipoglucemia. Entre los que se incluyen:  Mareos.  Temblores.  Sudoracin.  Escalofros.  Confusin.  Beba gran cantidad de agua mientras hace ejercicios para evitar la deshidratacin o los golpes de calor. Durante la actividad fsica se pierde agua corporal que se debe reponer.  Comente con su mdico antes de comenzar un programa de actividad fsica para verificar que sea seguro para usted. Recuerde, cualquier actividad es mejor que ninguna. Document Released: 03/11/2007 Document Revised: 07/06/2013 ExitCare Patient Information 2015 [employer]. This information is not intended to replace advice given to you by your health care provider. Make sure you discuss any questions you have with your health care provider.  

## 2014-03-17 NOTE — Progress Notes (Signed)
Prior authorization form for Januvia initiated.  Form placed in Dr. Armen PickupFunches' box for review, completion, and signature.  Form will need to be faxed to [facility] [phone]) when complete.

## 2014-03-17 NOTE — Progress Notes (Signed)
Patient ID: Joseph Roberts, male   DOB: 24-Nov-1960, 54 y.o.   MRN: [id number]   Joseph Roberts, is a 55 y.o. male  SHU:[id number]  [id number]:[id number]  DOB - 01-09-1961  Chief Complaint  Patient presents with  . Follow-up        Subjective:   Joseph Roberts is a 54 y.o. male here today for a follow up visit. Patient is known to have diabetes mellitus type 2 and hypertriglyceridemia, was sent to the clinic from nutrition center where he was found to have hyperglycemia. In the clinic today blood sugars about 330, patient is on metformin 1000 mg tablet by mouth twice a day. Because of persistent hyperglycemia, Janumet was ordered by patient's PCP but insurance did not approve. Patient has no complaint today. Patient has No headache, No chest pain, No abdominal pain - No Nausea, No new weakness tingling or numbness, No Cough - SOB.  No problems updated.  ALLERGIES: No Known Allergies  PAST MEDICAL HISTORY: Past Medical History  Diagnosis Date  . Diabetes mellitus 2011  . High triglycerides     MEDICATIONS AT HOME: Prior to Admission medications   Medication Sig Start Date End Date Taking? Authorizing Provider  atorvastatin (LIPITOR) 40 MG tablet Take 1 tablet (40 mg total) by mouth daily. 02/17/14  Yes Josalyn C Funches, MD  metFORMIN (GLUCOPHAGE) 1000 MG tablet Take 1 tablet (1,000 mg total) by mouth 2 (two) times daily with a meal. 02/17/14  Yes Josalyn C Funches, MD  Blood Glucose Monitoring Suppl (ONE TOUCH ULTRA MINI) W/DEVICE KIT 1 each by Does not apply route daily. Test fasting daily    Historical Provider, MD  glucose blood test strip 1 each by Other route as needed for other. Test Strips One Touch Ultra Blue Test fasting daily    Historical Provider, MD  pantoprazole (PROTONIX) 40 MG tablet Take 1 tablet (40 mg total) by mouth daily. Patient not taking: Reported on 02/04/2014 10/24/13   Jessee Avers, MD  saxagliptin HCl (ONGLYZA) 2.5 MG TABS tablet Take  1 tablet (2.5 mg total) by mouth daily. 03/17/14   Tresa Garter, MD     Objective:   Filed Vitals:   03/17/14 1452  BP: 120/76  Pulse: 64  Temp: 98.1 F (36.7 C)  TempSrc: Oral  Resp: 16  SpO2: 98%    Exam General appearance : Awake, alert, not in any distress. Speech Clear. Not toxic looking HEENT: Atraumatic and Normocephalic, pupils equally reactive to light and accomodation Neck: supple, no JVD. No cervical lymphadenopathy.  Chest:Good air entry bilaterally, no added sounds  CVS: S1 S2 regular, no murmurs.  Abdomen: Bowel sounds present, Non tender and not distended with no gaurding, rigidity or rebound. Extremities: B/L Lower Ext shows no edema, both legs are warm to touch Neurology: Awake alert, and oriented X 3, CN II-XII intact, Non focal Skin:No Rash Wounds:N/A  Data Review Lab Results  Component Value Date   HGBA1C 8.8 02/04/2014   HGBA1C 6.9* 10/24/2013   HGBA1C 9.8 04/06/2013     Assessment & Plan   1. Type 2 diabetes mellitus without complication  - Glucose (CBG) - insulin aspart (novoLOG) injection 20 Units; Inject 0.2 mLs (20 Units total) into the skin once.  We will discontinue Janumet/Januvia, add Onglyza and continue metformin 1000 mg tablet by mouth twice a day  - saxagliptin HCl (ONGLYZA) 2.5 MG TABS tablet; Take 1 tablet (2.5 mg total) by mouth daily.  Dispense: 30 tablet; Refill: 3 - Glucose (CBG)  Patient is to maintain a blood glucose log for the next 2 weeks and bring the log to clinic for review  Patient was counseled extensively about nutrition and exercise  Interpreter was used to communicate directly with patient for the entire encounter including providing detailed patient instructions.   Return in about 2 weeks (around 03/31/2014) for CBG, Lab/Nurse Visit.  The patient was given clear instructions to go to ER or return to medical center if symptoms don't improve, worsen or new problems develop. The patient verbalized  understanding. The patient was told to call to get lab results if they haven't heard anything in the next week.   This note has been created with Surveyor, quantity. Any transcriptional errors are unintentional.    Angelica Chessman, MD, [place], [place], [place] and [place], [place]   03/17/2014, 3:29 PM

## 2014-03-17 NOTE — Progress Notes (Signed)
Pt is here b/c he is having elevated BS and needs his medications adjusted.  Pt reports having a headache since this morning. Pt has an interpreter.

## 2014-04-23 ENCOUNTER — Ambulatory Visit: Payer: 59 | Attending: Family Medicine

## 2014-04-23 DIAGNOSIS — E119 Type 2 diabetes mellitus without complications: Secondary | ICD-10-CM

## 2014-04-23 LAB — GLUCOSE, POCT (MANUAL RESULT ENTRY): POC GLUCOSE: 98 mg/dL (ref 70–99)

## 2014-04-23 LAB — POCT GLYCOSYLATED HEMOGLOBIN (HGB A1C): Hemoglobin A1C: 8.7

## 2014-04-23 MED ORDER — METFORMIN HCL 1000 MG PO TABS: 1000.0000 mg | ORAL_TABLET | Freq: Two times a day (BID) | ORAL | Status: DC

## 2014-04-23 MED ORDER — SAXAGLIPTIN HCL 2.5 MG PO TABS: 2.5000 mg | ORAL_TABLET | Freq: Every day | ORAL | Status: DC

## 2014-04-23 NOTE — Patient Instructions (Addendum)
Dejar de fumar (Smoking Cessation) Dejar de fumar es importante para su salud y tiene Product/process development scientist. Sin embargo, no siempre es Control and instrumentation engineer de fumar ya que la nicotina es una droga [place]. Generalmente, las personas intentan 3 veces o ms antes de poder dejar de fumar. En este documento se explican las mejores formas de prepararse para dejar de fumar. Esta decisin requiere [employer] Corporation y mucho esfuerzo, pero usted puede [place]. Hytop  Vivir ms, se sentir mejor y vivir mejor.  El cuerpo sentir el impacto de dejar de fumar de inmediato.  Luego de 20 minutos la presin arterial disminuye. El pulso vuelve a su nivel normal.  Despus de 8 horas, los niveles de monxido de carbono en la sangre vuelven a la normalidad. Aumenta el nivel de oxgeno.  Despus de 24 horas, la probabilidad de infarto comienza a disminuir. La respiracin, el cabello y el cuerpo ya no huelen a humo.  Luego de 48 horas, los nervios daados comienzan a recuperarse. Mejoran el sentido del gusto y Armed forces logistics/support/administrative officer.  Luego de 72 horas, el organismo est virtualmente libre de nicotina. Los conductos bronquiales se relajan, la respiracin se normaliza.  Despus de 2 a 12 semanas, los pulmones pueden contener ms aire. Es ms fcil realizar actividad fsica y [employer]xon [employer]il Corporation respiracin.  El riesgo de sufrir un infarto, un ictus, cncer o enfermedad pulmonar disminuye en gran medida.  Despus de 1 ao, el riesgo de coronariopatas disminuye a la mitad.  Despus de 5 aos, el riesgo de ictus disminuye al nivel de un no fumador.  Despus de 10 aos, el riesgo de cncer de pulmn disminuye a la mitad, y el riesgo de sufrir otros tipos de cncer disminuye considerablemente.  Despus de 15 aos, el riesgo de enfermedad coronaria disminuye, generalmente al nivel de un no fumador.  Si est embarazada, al dejar de fumar aumentar las probabilidades de tener un beb sano.  Las personas con las que convive,  [employer] nios, estarn ms saludables.  Tendr dinero extra para gastar en otras cosas que no sean cigarrillos. PREGUNTAS PARA PENSAR ANTES DE Allena Katz DEJAR DE FUMAR Quizs desee hablar acerca de sus respuestas con el mdico.  Por qu desea dejar de fumar?  Cuando trat de dejar de fumar en el pasado, qu lo ayud y qu no lo ayud?  Cules sern las situaciones ms difciles para usted despus de dejar de fumar? Cmo planea manejarlas?  Quin puede ayudarlo en los momentos difciles? Su familia? Sus amigos? El mdico?  Qu placeres obtiene cuando fuma? De qu manera puede seguir obteniendo placer si abandona el hbito? Estas son algunas preguntas que puede hacerle al mdico:  Cmo puede ayudarme a dejar de fumar con xito?  Qu medicamento cree que sera el mejor para m, y cmo debo tomarlo?  Qu debo hacer si necesito ms ayuda?  Cmo es la desintoxicacin del cigarrillo? Cmo puedo obtener informacin acerca de la desintoxicacin? PREPRESE  Establezca una fecha para dejar de fumar.  Cambie su entorno, deshacindose de los cigarrillos, ceniceros, fsforos y encendedores en su casa, el auto o el [place]. No permita que nadie fume dentro de su casa.  Repase sus intentos anteriores. Piense en qu cosas funcionaron y cules no. BUSQUE AYUDA Y ESTMULO Usted tiene mejores probabilidades de tener xito si cuenta con ayuda. Puede obtener apoyo de Abbott Laboratories:  Dgales a sus familiares, amigos y compaeros de trabajo que usted dejar de fumar y que necesita su apoyo.  Pdales que no fumen a su alrededor.  Obtenga consejo y 77 individual, grupal o telefnico. Hay programas que se ofrecen en hospitales y centros mdicos locales. Comunquese con el departamento de salud de su localidad para obtener informacin acerca de los programas disponibles en su rea.  Las creencias y prcticas espirituales pueden ayudar a los fumadores a abandonar el  hbito.  Descargue en su computadora un programa que registre sus estadsticas, por ejemplo, cunto hace que no fuma, la cantidad de cigarrillos que no ha fumado y el dinero ahorrado.  Consiga un libro de [place] sobre dejar de fumar y alejarse del tabaco. APRENDA NUEVAS DESTREZAS Y CONDUCTAS  Trate de entretenerse con otra cosa cuando sienta ganas de fumar. Hable con alguien, salga a caminar u ocpese en alguna tarea.  Cambie su rutina habitual. Salley Hews ruta diferente para llegar al [place]. Beba t en vez de caf. Desayune en un lugar diferente.  Reduzca las situaciones de estrs. Tome un bao caliente, practique alguna actividad fsica o lea un libro.  Planee hacer cada da algo que disfrute. Recompnsese por no fumar.  Explore programas interactivos en la web dedicados a ayudar a dejar de fumar. CONSIGA MEDICAMENTOS Y SELOS CORRECTAMENTE Algunos medicamentos pueden ayudar a dejar de fumar y Equities trader la necesidad de tabaco. Oceanographer los medicamentos con las conductas y mtodos de apoyo ya mencionados puede aumentar en gran medida sus posibilidades de dejar de fumar con xito.  La terapia de reemplazo de nicotina enva nicotina al organismo sin los efectos negativos y los riesgos del fumar. La terapia de reemplazo de nicotina incluye chicles, pastillas, inhaladores, aerosoles nasales y parches para la piel de nicotina. Algunos son de Rogelia Rohrer y otros requieren una receta mdica.  Los antidepresivos ayudan a las personas a Personal assistant de Copy, Armed forces training and education officer no se conoce cul es el mecanismo. Se venden bajo receta mdica.  Los Engel[employer] Corporation parciales de los receptores de nicotina simulan el efecto de la nicotina en el cerebro. Se venden bajo receta mdica. Pdale al mdico que lo aconseje ArvinMeritor medicamentos que debe Risk manager y [place] modo de utilizarlos en funcin de su historia clnica. El mdico le dir qu efectos secundarios deber Best boy en cuenta si decide utilizar un medicamento o seguir  un tratamiento. Lea cuidadosamente la informacin en el envase. No use otro producto que contenga nicotina mientras use uno de reemplazo.  [place] parte de las recadas se producen dentro de los 3 primeros meses de abandonar el hbito. No se desanime si comienza a fumar de nuevo. Recuerde, la [employer] tratan varias veces de dejar de fumar antes de lograrlo. Podr sufrir sndrome de abstinencia porque su cuerpo est acostumbrado a la nicotina. Podr sentir el deseo compulsivo de fumar, irritabilidad, enojo, tos, cefaleas y dificultad para concentrarse. Estos sntomas son transitorios. Son ms intensos en un comienzo, pero desaparecern en 10 a 14 das. Para reducir las probabilidades de fracaso:  Evite el consumo alcohol. El beber disminuye sus posibilidades de xito.  Disminuya el consumo de cafena. Una vez que deje de fumar, la cantidad de cafena en su organismo aumenta y puede darle sntomas, como frecuencia cardaca rpida, sudoracin y ansiedad.  Evite a las [employer] fuman porque pueden hacer que usted desee [place].  No deje que el aumento de peso distraiga su objetivo. Muchos fumadores aumentarn de peso cuando dejen de fumar, generalmente menos de 10 libras (4,5 kg). Consuma una dieta saludable y [place]. Siempre podr  perder el peso que gan despus de dejar de fumar.  Encuentre formas de mejorar su estado de nimo que no sean fumando. PARA OBTENER MS INFORMACIN  www.smokefree.gov  Document Released: 02/19/2005 Document Revised: 07/06/2013 [facility] Patient Information 2015 [place]ExitCare, MarylandLLC. This information is not intended to replace advice given to you by your health care provider. Make sure you discuss any questions you have with your health care provider. Plan de alimentacin DASH (DASH Eating Plan) DASH es la sigla en ingls de "Enfoques Alimentarios para Detener la Hipertensin". El plan de alimentacin DASH ha  demostrado bajar la presin arterial elevada (hipertensin). Los beneficios adicionales para la salud pueden incluir la disminucin del riesgo de diabetes mellitus tipo2, enfermedades cardacas e ictus. Este plan tambin puede ayudar a Geophysical data processoradelgazar. QU DEBO SABER ACERCA DEL PLAN DE ALIMENTACIN DASH? Para el plan de alimentacin DASH, seguir las siguientes pautas generales:  Elija los alimentos con un valor porcentual diario de sodio de menos del 5% (segn figura en la etiqueta del alimento).  Use hierbas o aderezos sin sal, en lugar de sal de mesa o sal marina.  Consulte al mdico o farmacutico antes de usar sustitutos de la sal.  Coma productos con bajo contenido de sodio, cuya etiqueta suele decir "bajo contenido de sodio" o "sin agregado de sal".  Coma alimentos frescos.  Coma ms verduras, frutas y productos lcteos con bajo contenido de [place]dalegrasas.  Elija los cereales integrales. Busque la palabra "integral" en Estate agentel primer lugar de la lista de ingredientes.  Elija el pescado y el pollo o el pavo sin piel ms a menudo que las carnes rojas. Limite el consumo de pescado, carne de ave y carne a 6onzas (170g) por Futures traderda.  Limite el consumo de dulces, postres, azcares y bebidas azucaradas.  Elija las grasas saludables para el corazn.  Limite el consumo de queso a 1onza (28g) por Futures traderda.  Consuma ms comida casera y menos de restaurante, de buf y comida rpida.  Limite el consumo de alimentos fritos.  Cocine los alimentos utilizando mtodos que no sean la fritura.  Limite las verduras enlatadas. Si las consume, enjuguelas bien para disminuir el sodio.  Cuando coma en un restaurante, pida que preparen su comida con menos sal o, en lo posible, sin nada de sal. QU ALIMENTOS PUEDO COMER? Pida ayuda a un nutricionista para conocer las necesidades calricas individuales. Cereales Pan de salvado o integral. Arroz integral. Pastas de salvado o integrales. Quinua, trigo burgol y cereales  integrales. Cereales con bajo contenido de sodio. Tortillas de harina de maz o de salvado. Pan de maz integral. Galletas saladas integrales. Galletas con bajo contenido de McIntyresodio. Vegetales Verduras frescas o congeladas (crudas, al vapor, asadas o grilladas). Jugos de tomate y verduras con contenido bajo o reducido de sodio. Pasta y salsa de tomate con contenido bajo o reducido de sodio. Verduras enlatadas con bajo contenido de sodio o reducido de sodio.  Nils PyleFrutas Nils PyleFrutas frescas, en conserva (en su jugo natural) o frutas congeladas. Carnes y otros productos con protenas Carne de res molida (al 85% o ms [place] Marinomagra), carne de res de animales alimentados con pastos o carne de res sin la grasa. Pollo o pavo sin piel. Carne de pollo o de Taylorpavo molida. Cerdo sin la grasa. Todos los pescados y frutos de mar. Huevos. Porotos, guisantes o lentejas secos. Frutos secos y semillas sin sal. Frijoles enlatados sin sal. Lcteos Productos lcteos con bajo contenido de grasas, como leche descremada o al 1%, quesos reducidos en [place]grasas o  al 2%, ricota con bajo contenido de grasas o [employer], o yogur natural con bajo contenido de [place]. Quesos con contenido bajo o reducido de sodio. Grasas y Writer en barra que no contengan grasas trans. [place] y alios para ensaladas livianos o reducidos en grasas (reducidos en sodio). [place]. Aceites de crtamo, oliva o canola. Mantequilla natural de man o almendra. Otros Palomitas de maz y pretzels sin sal. Los artculos mencionados arriba pueden no ser Raytheon de las bebidas o los alimentos recomendados. Comunquese con el nutricionista para conocer ms opciones. QU ALIMENTOS NO SE RECOMIENDAN? Cereales Pan blanco. Pastas blancas. Arroz blanco. Pan de maz refinado. Bagels y croissants. Galletas saladas que contengan grasas trans. Vegetales Vegetales con crema o fritos. Verduras en salsa de [place]. Verduras enlatadas comunes. Pasta y salsa de  tomate en lata comunes. Jugos comunes de tomate y de verduras. Nils Pyle Frutas secas. Fruta enlatada en almbar liviano o espeso. Jugo de frutas. Carnes y otros productos con protenas [place] de carne con Holiday representative. Costillas, alas de pollo, tocineta, salchicha, mortadela, salame, chinchulines, tocino, perros calientes, salchichas alemanas y embutidos envasados. Frutos secos y semillas con sal. Frijoles con sal en lata. Lcteos Leche entera o al 2%, crema, mezcla de [place] y crema, y queso crema. Yogur entero o endulzado. Quesos o queso azul con alto contenido de Neurosurgeon. [place]rem[place] no lcteas y coberturas batidas. Quesos procesados, quesos para untar o cuajadas. Condimentos Sal de cebolla y ajo, sal condimentada, sal de mesa y sal marina. Salsas en lata y envasadas. Salsa [place]. Salsa trtara. Salsa barbacoa. Salsa teriyaki. Salsa de soja, incluso la que tiene contenido reducido de [place]. Salsa de carne. Salsa de pescado. Salsa de [place]. Salsa rosada. Rbano picante. Ketchup y mostaza. Saborizantes y tiernizantes para carne. Caldo en cubitos. Salsa picante. Salsa tabasco. Adobos. Aderezos para tacos. Salsas. Grasas y [address], [place] en barra, [place] de [place], [place], [place] clarificada y Steffanie [place] de tocino. Aceites de coco, de palmiste o de palma. Aderezos comunes para ensalada. Otros Pickles y Christie. Palomitas de maz y pretzels con sal. Los artculos mencionados arriba pueden no ser Raytheon de las bebidas y los alimentos que se Theatre stage manager. Comunquese con el nutricionista para obtener ms informacin. DNDE Joseph Mora MS INFORMACIN? Instituto Nacional del Acorn, del Pulmn y de la Sangre (National Heart, Lung, and Blood Institute): CablePromo.it Document Released: 02/08/2011 Document Revised: 07/06/2013 [facility] Patient Information 2015 [place], [place]. This information is not intended to replace advice given to you  by your health care provider. Make sure you discuss any questions you have with your health care provider. Recuento bsico de carbohidratos para la diabetes mellitus (Basic Carbohydrate Counting for Diabetes Mellitus) El recuento de carbohidratos es un mtodo destinado a calcular la cantidad de carbohidratos en la dieta. El consumo de carbohidratos aumenta naturalmente el nivel de azcar (glucosa) en la sangre, por lo que es importante que sepa la cantidad que debe incluir en cada comida. El recuento de carbohidratos ayuda a Futures trader de glucosa en la sangre dentro de los lmites normales. La cantidad permitida de carbohidratos es diferente para cada persona. Un nutricionista puede ayudarlo a calcular la cantidad adecuada para usted. Una vez que sepa la cantidad de carbohidratos que puede consumir, podr calcular los carbohidratos de los alimentos que desea comer. Los siguientes alimentos incluyen carbohidratos:  Granos, como panes y cereales.  Frijoles secos y productos con soja.  Vegetales almidonados, como papas, guisantes y maz.  Nils Pyle y [place] de  frutas.  Leche y Dentist.  Dulces y bocadillos, como pastel, galletas, caramelos, papas fritas de bolsa, refrescos y bebidas frutales con azcar. RECUENTO DE CARBOHIDRATOS [employer] Us de calcular los carbohidratos de los alimentos. Puede usar cualquiera de [address] o [place] combinacin de [place]. Leer la etiqueta de informacin nutricional de los alimentos envasados La informacin nutricional es una etiqueta incluida en casi todas las bebidas y los alimentos envasados de los [place]. Indica el tamao de la porcin de ese alimento o bebida e informacin sobre los nutrientes de cada porcin, incluso los gramos (g) de carbohidratos por porcin.  Decida la cantidad de porciones que comer o tomar de este alimento o bebida. Multiplique la cantidad de porciones por el nmero de gramos de carbohidratos indicados en la etiqueta para  esa porcin. El total ser la cantidad de carbohidratos que consumir al comer ese alimento o tomar esa bebida. Conocer las porciones estndar de los alimentos Cuando coma alimentos no envasados o que no incluyan la informacin nutricional en la etiqueta, deber medir las porciones para poder calcular la cantidad de carbohidratos. Una porcin de la mayora de los alimentos ricos en carbohidratos contiene alrededor de 15g de carbohidratos. La siguiente [employer] Corporation tamaos de porcin de los alimentos ricos en carbohidratos que contienen alrededor de 15g de carbohidratos por porcin:   1rebanada de pan (1oz) o 1tortilla de seis pulgadas.  panecillo de hamburguesa o bollito tipo ingls.  4a 6galletas.   de taza de cereal sin azcar y seco.   taza de cereal caliente.   de taza de arroz o pastas.  taza de pur de papas o de una papa grande al horno.  1taza de frutas frescas o una fruta pequea.  taza de frutas o jugo de frutas enlatados o congelados.  1 taza [employer].   de taza de yogur descremado sin ningn agregado o de yogur endulzado con edulcorante artificial.  taza de vegetales almidonados, como guisantes, maz o papas, o de frijoles secos cocidos. Decida la cantidad de porciones Advertising copywriter. Multiplique la cantidad de porciones por 15 (los gramos de carbohidratos en esa porcin). Por ejemplo, si come 2tazas de fresas, habr comido 2porciones y 30g de carbohidratos (2porciones x 15g = 30g). Para las comidas como sopas y guisos, en las que se mezcla ms de un alimento, deber [place] [employer] carbohidratos de cada alimento incluido. EJEMPLO DE RECUENTO DE CARBOHIDRATOS Ejemplo de cena  3 onzas de pechugas de pollo.   de taza de arroz integral.   taza de maz.  1 taza de [place].  1 taza de fresas con crema batida sin azcar. Clculo de carbohidratos [place] 1: Identifique los alimentos que contienen carbohidratos:    Arroz.  Maz.  Leche.  Jinny Sanders. [place] 2: Calcule el nmero de porciones que consumir de cada uno:   2 porciones de Surveyor, minerals.  1 porcin de maz.  1 porcin de leche.  1 porcin de fresas. [place] 3: Multiplique cada una de esas porciones por 15g:   2 porciones de arroz x 15 g = 30 g.  1 porcin de maz x 15 g = 15 g.  1 porcin de leche x 15 g = 15 g.  1 porcin de fresas x 15 g = 15 g. [place] 4: Sume todas las cantidades para Artist total de gramos de carbohidratos consumidos: 30 g + 15 g + 15 g + 15 g = 75 g. Document Released: 05/14/2011 Document Revised: 07/06/2013 ExitCare Patient Information  2015 ExitCare, LLC. This information is not intended to replace advice given to you by your health care provider. Make sure you discuss any questions you have with your health care provider.   Return in 2 weeks for nurse visit, please bring CBG log Continue to take medication as prescribed  Stop smoking,smoking cessation information given

## 2014-04-23 NOTE — Progress Notes (Signed)
Pt comes in for CBg check with reading today Pt forgot to bring blood sugar log CBG- 98 A1c- increased from 6.9 6 mnths ago to 8.7 States he is taking prescribed Metformin 1000mg  tab BID and Onglyza 2.5 mg tab States he eats sausage biscuit @ [employer], grilled chicken for lunch and an apple for supper Pacific interpretor used for communication ID# [id number]

## 2014-04-23 NOTE — Addendum Note (Signed)
Addended by: Nonnie DoneSMITH, Janai Brannigan D on: 04/23/2014 05:28 PM   Modules accepted: Orders, Medications

## 2014-04-30 ENCOUNTER — Ambulatory Visit: Payer: 59

## 2014-05-07 ENCOUNTER — Other Ambulatory Visit: Payer: 59

## 2014-06-14 ENCOUNTER — Ambulatory Visit (INDEPENDENT_AMBULATORY_CARE_PROVIDER_SITE_OTHER): Payer: 59 | Admitting: Emergency Medicine

## 2014-06-14 VITALS — BP 120/78 | HR 56 | Temp 97.5°F | Resp 17 | Ht 65.0 in | Wt 165.0 lb

## 2014-06-14 DIAGNOSIS — R300 Dysuria: Secondary | ICD-10-CM

## 2014-06-14 DIAGNOSIS — M545 Low back pain: Secondary | ICD-10-CM | POA: Diagnosis not present

## 2014-06-14 LAB — POCT URINALYSIS DIPSTICK
Bilirubin, UA: NEGATIVE
Blood, UA: NEGATIVE
Glucose, UA: NEGATIVE
KETONES UA: NEGATIVE
Leukocytes, UA: NEGATIVE
Nitrite, UA: NEGATIVE
Protein, UA: 30
Spec Grav, UA: >=1.03
UROBILINOGEN UA: 0.2
pH, UA: 5

## 2014-06-14 LAB — POCT UA - MICROSCOPIC ONLY
BACTERIA, U MICROSCOPIC: NEGATIVE
CASTS, UR, LPF, POC: NEGATIVE
Epithelial cells, urine per micros: NEGATIVE
RBC, urine, microscopic: NEGATIVE
YEAST UA: NEGATIVE

## 2014-06-14 MED ORDER — NAPROXEN SODIUM 550 MG PO TABS: 550.0000 mg | ORAL_TABLET | Freq: Two times a day (BID) | ORAL | Status: AC

## 2014-06-14 MED ORDER — CYCLOBENZAPRINE HCL 10 MG PO TABS: 10.0000 mg | ORAL_TABLET | Freq: Three times a day (TID) | ORAL | Status: DC | PRN

## 2014-06-14 MED ORDER — NAPROXEN SODIUM 550 MG PO TABS: 550.0000 mg | ORAL_TABLET | Freq: Two times a day (BID) | ORAL | Status: DC

## 2014-06-14 NOTE — Addendum Note (Signed)
Addended by: Laurey ArrowGREER, JULIE M on: 06/14/2014 07:16 PM   Modules accepted: Orders

## 2014-06-14 NOTE — Patient Instructions (Signed)

## 2014-06-14 NOTE — Progress Notes (Signed)
[facility] [address], [place] [address] [phone]  Date:  06/14/2014   Name:  Joseph Roberts   DOB:  1960-06-15   MRN:  [id number]  PCP:  Minerva Ends, MD    Chief Complaint: Dysuria; Back Pain; and Blood Sugar Problem   History of Present Illness:  Joseph Roberts is a 54 y.o. very pleasant male patient who presents with the following:  Patient has a two week history of pain in the right lower back No history of injury Works in Biomedical scientist. Initially had some dysuria with no discharge or hematuria Pain not radiating  No associated neuro symptoms or weakness. No nausea or vomiting. No stool change. No improvement with over the counter medications or other home remedies.  Denies other complaint or health concern today.   Patient Active Problem List   Diagnosis Date Noted  . 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 Dyslipidemia with low high density lipoprotein (HDL) cholesterol with hypertriglyceridemia due to type 2 diabetes mellitus 11/03/2013  . Gall bladder polyp 10/24/2013  . GERD (gastroesophageal reflux disease) 10/24/2013  . Type 2 diabetes mellitus 10/23/2013    Past Medical History  Diagnosis Date  . Diabetes mellitus 2011  . High triglycerides     Past Surgical History  Procedure Laterality Date  . Colonoscopy N/A 10/24/2013    Procedure: COLONOSCOPY;  Surgeon: Ladene Artist, MD;  [facility] [address] [facility];  Service: Endoscopy;  Laterality: N/A;. diverticulosis, mild, small internal hemorrhoids    History  Substance Use Topics  . Smoking status: Current Every Roberts Smoker -- 0.50 packs/Roberts for 30 years    Types: Cigarettes  . Smokeless tobacco: Never Used  . Alcohol Use: No    Family History  Problem Relation Age of Onset  . Diabetes Mother   . Diabetes Sister   . Diabetes Brother     No Known Allergies  Medication list has been reviewed and updated.  Current Outpatient Prescriptions on File Prior to  Visit  Medication Sig Dispense Refill  . atorvastatin (LIPITOR) 40 MG tablet Take 1 tablet (40 mg total) by mouth daily. 90 tablet 3  . Blood Glucose Monitoring Suppl (ONE TOUCH ULTRA MINI) W/DEVICE KIT 1 each by Does not apply route daily. Test fasting daily    . glucose blood test strip 1 each by Other route as needed for other. Test Strips One Touch Ultra Blue Test fasting daily    . metFORMIN (GLUCOPHAGE) 1000 MG tablet Take 1 tablet (1,000 mg total) by mouth 2 (two) times daily with a meal. 180 tablet 3  . saxagliptin HCl (ONGLYZA) 2.5 MG TABS tablet Take 1 tablet (2.5 mg total) by mouth daily. 30 tablet 3  . pantoprazole (PROTONIX) 40 MG tablet Take 1 tablet (40 mg total) by mouth daily. (Patient not taking: Reported on 02/04/2014) 30 tablet 0   No current facility-administered medications on file prior to visit.    Review of Systems:  As per HPI, otherwise negative.    Physical Examination: Filed Vitals:   06/14/14 1055  BP: 120/78  Pulse: 56  Temp: 97.5 F (36.4 C)  Resp: 17   Filed Vitals:   06/14/14 1055  Height: 5' 5" (1.651 m)  Weight: 165 lb (74.844 kg)   Body mass index is 27.46 kg/(m^2). Ideal Body Weight: Weight in (lb) to have BMI = 25: 149.9  GEN: WDWN, NAD, Non-toxic, A & O x 3 HEENT: Atraumatic, Normocephalic. Neck supple. No masses, No LAD. Ears  and Nose: No external deformity. CV: RRR, No M/G/R. No JVD. No thrill. No extra heart sounds. PULM: CTA B, no wheezes, crackles, rhonchi. No retractions. No resp. distress. No accessory muscle use. ABD: S, NT, ND, +BS. No rebound. No HSM. EXTR: No c/c/e NEURO Normal gait.  PSYCH: Normally interactive. Conversant. Not depressed or anxious appearing.  Calm demeanor.  BACK::  Tender right lumbar paraspinous  Normal neuro  Assessment and Plan: Lumbar strain Anaprox Flexeril Local heat  Signed,  Ellison Carwin, MD   Results for orders placed or performed in visit on 06/14/14  POCT UA - Microscopic Only   Result Value Ref Range   WBC, Ur, HPF, POC 0-4    RBC, urine, microscopic neg    Bacteria, U Microscopic neg    Mucus, UA trace    Epithelial cells, urine per micros neg    Crystals, Ur, HPF, POC calcium oxalate    Casts, Ur, LPF, POC neg    Yeast, UA neg   POCT urinalysis dipstick  Result Value Ref Range   Color, UA yellow    Clarity, UA clear    Glucose, UA neg    Bilirubin, UA neg    Ketones, UA neg    Spec Grav, UA >=1.030    Blood, UA neg    pH, UA 5.0    Protein, UA 30    Urobilinogen, UA 0.2    Nitrite, UA neg    Leukocytes, UA Negative

## 2014-07-31 ENCOUNTER — Ambulatory Visit (INDEPENDENT_AMBULATORY_CARE_PROVIDER_SITE_OTHER): Payer: 59 | Admitting: Urgent Care

## 2014-07-31 VITALS — BP 110/80 | HR 65 | Temp 97.9°F | Ht 65.5 in | Wt 167.5 lb

## 2014-07-31 DIAGNOSIS — E119 Type 2 diabetes mellitus without complications: Secondary | ICD-10-CM

## 2014-07-31 DIAGNOSIS — J019 Acute sinusitis, unspecified: Secondary | ICD-10-CM

## 2014-07-31 MED ORDER — SAXAGLIPTIN HCL 2.5 MG PO TABS: 2.5000 mg | ORAL_TABLET | Freq: Every day | ORAL | Status: DC

## 2014-07-31 MED ORDER — AMOXICILLIN 875 MG PO TABS: 875.0000 mg | ORAL_TABLET | Freq: Two times a day (BID) | ORAL | Status: AC

## 2014-07-31 NOTE — Progress Notes (Signed)
    MRN: [id number] DOB: March 22, 1960  Subjective:   Joseph Roberts is a 54 y.o. male presenting for chief complaint of Sore Throat; Fever; Cough; and Medication Refill  Reports 10 day history of subjective fever, sinus congestion, right ear pain, lymph node pain, sore throat, mild productive cough. Has tried Advil for fever with some relief. Denies chest pain, wheezing, sinus pain, ear drainage, itchy or watery red eyes, eye pain n/v, abdominal pain, diarrhea, myalgia. Denies any other aggravating or relieving factors, no other questions or concerns.  Lars MageJuan has a current medication list which includes the following prescription(s): atorvastatin, one touch ultra mini, cyclobenzaprine, glucose blood, metformin, naproxen sodium, pantoprazole, and saxagliptin hcl. He has No Known Allergies.  Lars MageJuan  has a past medical history of Diabetes mellitus (2011) and High triglycerides. Also  has past surgical history that includes Colonoscopy (N/A, 10/24/2013).  ROS As in subjective.  Objective:   Vitals: BP 110/80 mmHg  Pulse 65  Temp(Src) 97.9 F (36.6 C) (Oral)  Ht 5' 5.5" (1.664 m)  Wt 167 lb 8 oz (75.978 kg)  BMI 27.44 kg/m2  SpO2 98%  Physical Exam  Constitutional: He appears well-developed and well-nourished.  HENT:  TM's flat bilaterally, no effusions or erythema. Nasal turbinates inflamed. Bilateral maxillary sinus tenderness. Postnasal drip present, oropharyngeal erythema without exudates or abscesses.  Cardiovascular: Normal rate, regular rhythm and intact distal pulses.  Exam reveals no gallop and no friction rub.   No murmur heard. Pulmonary/Chest: No respiratory distress. He has no wheezes. He has no rales. He exhibits no tenderness.   Assessment and Plan :   1. Acute sinusitis, recurrence not specified, unspecified location - Start Amoxicillin x10 days, fluids and rest - rtc if symptoms do not resolve despite antibiotic course  2. Type 2 diabetes mellitus without  complication - Refilled Onglyza, patient requested this at the end of his visit so I recommended he have diabetes follow up here within the next 1-3 months. He was being followed by an endocrinologist but he thinks the hours here are better for him.   Wallis BambergMario Junia Nygren, PA-C [facility] [phone] 07/31/2014 10:36 AM

## 2014-07-31 NOTE — Patient Instructions (Signed)
Sinusitis (Sinusitis) La sinusitis es el enrojecimiento, el dolor y la inflamacin de los senos paranasales. Los senos paranasales son bolsas de aire que se encuentran dentro de los huesos de la cara (por debajo de los ojos, en la mitad de la frente o por encima de los ojos). En los senos paranasales sanos, el moco puede drenar y el aire circula a travs de ellos en su camino hacia la nariz. Sin embargo, cuando se inflaman, el moco y el aire quedan atrapados. Esto hace que se desarrollen bacterias y otros grmenes que causan infeccin. La sinusitis puede desarrollarse rpidamente y durar solo un tiempo corto (aguda) o continuar por un perodo largo (crnica). La sinusitis que dura ms de 12 semanas se considera crnica.  CAUSAS  Las causas de la sinusitis son:  Cualquier alergia que tenga.  Las anomalas estructurales, como el desplazamiento del cartlago que separa las fosas nasales (desvo del tabique), que pueden disminuir el flujo de aire por la nariz y los senos paranasales, y afectar su drenaje.  Las anomalas funcionales, como cuando los pequeos pelos (cilias) que se encuentran en los senos paranasales y que ayudan a eliminar el moco no funcionan correctamente o no estn presentes. SIGNOS Y SNTOMAS  Los sntomas de la sinusitis aguda y crnica son los mismos. Los sntomas principales son el dolor y la presin alrededor de los senos paranasales afectados. Otros sntomas son:  Dolor en los dientes superiores.  Dolor de odos.  Dolor de cabeza.  Mal aliento.  Disminucin del sentido del olfato y del gusto.  Tos, que empeora al acostarse.  Fatiga.  Fiebre.  Drenaje de moco espeso por la nariz, que generalmente es de color verde y puede contener pus (purulento).  Hinchazn y calor en los senos paranasales afectados. DIAGNSTICO  Su mdico le realizar un examen fsico. Durante el examen, el mdico:  Le revisar la nariz para buscar signos de crecimientos anormales en las  fosas nasales (plipos nasales).  Palpar los senos paranasales afectados para buscar signos de infeccin.  Ver el interior de los senos paranasales (endoscopia) a travs de un dispositivo de obtencin de imgenes que tiene una luz conectada (endoscopio). Si el mdico sospecha que usted sufre sinusitis crnica, podr indicar una o ms de las siguientes pruebas:  Pruebas de alergia.  Cultivo de las secreciones nasales. Se extrae una muestra de moco de la nariz, que se enva al laboratorio para detectar bacterias.  Citologa nasal. Se extrae una muestra de moco de la nariz, que el mdico examina para determinar si la sinusitis est relacionada con una alergia. TRATAMIENTO  La mayora de los casos de sinusitis aguda se deben a una infeccin viral y se resuelven espontneamente en un perodo de 10das. En algunos casos, se recetan medicamentos para aliviar los sntomas (analgsicos, descongestivos, aerosoles nasales con corticoides o aerosoles salinos).  Sin embargo, para la sinusitis por infeccin bacteriana, el mdico le recetar antibiticos. Los antibiticos son medicamentos que destruyen las bacterias que causan la infeccin.  Con poca frecuencia, la sinusitis tiene su origen en una infeccin por hongos. En estos casos, el mdico recetar un medicamento antimictico. Para algunos casos de sinusitis crnica, es necesario someterse a una ciruga. Generalmente, se trata de casos en los que la sinusitis se repite ms de 3veces al ao, a pesar de otros tratamientos. INSTRUCCIONES PARA EL CUIDADO EN EL HOGAR   Beber gran cantidad de lquidos. Los lquidos ayudan a disolver el moco, para que drene ms fcilmente de los senos paranasales.    Use un humidificador.  Inhale vapor de 3 a 4 veces al da (por ejemplo, sintese en el bao con la ducha abierta).  Aplquese un pao tibio y hmedo en la cara 3 o 4 veces al da o segn las indicaciones del mdico.  Use un aerosol nasal salino para ayudar  a humedecer y limpiar los senos nasales.  Tome los medicamentos solamente como se lo haya indicado el mdico.  Si le recetaron un antimictico o un antibitico, asegrese de terminarlos, incluso si comienza a sentirse mejor. SOLICITE ATENCIN MDICA DE INMEDIATO SI:  Siente ms dolor o sufre dolores de cabeza intensos.  Tiene nuseas, vmitos o somnolencia.  Observa hinchazn alrededor del rostro.  Tiene problemas de visin.  Presenta rigidez en el cuello.  Tiene dificultad para respirar. ASEGRESE DE QUE:   Comprende estas instrucciones.  Controlar su afeccin.  Recibir ayuda de inmediato si no mejora o si empeora. Document Released: 11/29/2004 Document Revised: 07/06/2013 Exit[employer] Patient Information 2015 [employer]. This information is not intended to replace advice given to you by your health care provider. Make sure you discuss any questions you have with your health care provider.  

## 2015-04-08 ENCOUNTER — Ambulatory Visit (INDEPENDENT_AMBULATORY_CARE_PROVIDER_SITE_OTHER): Payer: 59 | Admitting: Physician Assistant

## 2015-04-08 VITALS — BP 122/78 | HR 67 | Temp 98.2°F | Resp 16 | Ht 65.5 in | Wt 176.0 lb

## 2015-04-08 DIAGNOSIS — E119 Type 2 diabetes mellitus without complications: Secondary | ICD-10-CM

## 2015-04-08 DIAGNOSIS — E118 Type 2 diabetes mellitus with unspecified complications: Secondary | ICD-10-CM | POA: Diagnosis not present

## 2015-04-08 DIAGNOSIS — J209 Acute bronchitis, unspecified: Secondary | ICD-10-CM | POA: Diagnosis not present

## 2015-04-08 LAB — COMPLETE METABOLIC PANEL WITH GFR
ALBUMIN: 4.2 g/dL (ref 3.6–5.1)
ALT: 22 U/L (ref 9–46)
AST: 16 U/L (ref 10–35)
Alkaline Phosphatase: 119 U/L — ABNORMAL HIGH (ref 40–115)
BUN: 14 mg/dL (ref 7–25)
CO2: 20 mmol/L (ref 20–31)
CREATININE: 0.79 mg/dL (ref 0.70–1.33)
Calcium: 9.4 mg/dL (ref 8.6–10.3)
Chloride: 106 mmol/L (ref 98–110)
GFR, Est African American: >89 mL/min (ref >=60)
GLUCOSE: 166 mg/dL — AB (ref 65–99)
Potassium: 4.2 mmol/L (ref 3.5–5.3)
Sodium: 135 mmol/L (ref 135–146)
TOTAL PROTEIN: 7.6 g/dL (ref 6.1–8.1)
Total Bilirubin: 0.5 mg/dL (ref 0.2–1.2)

## 2015-04-08 LAB — POCT GLYCOSYLATED HEMOGLOBIN (HGB A1C): Hemoglobin A1C: 7.8

## 2015-04-08 LAB — GLUCOSE, POCT (MANUAL RESULT ENTRY): POC Glucose: 162 mg/dl — AB (ref 70–99)

## 2015-04-08 MED ORDER — AZITHROMYCIN 250 MG PO TABS: ORAL_TABLET | ORAL | Status: DC

## 2015-04-08 MED ORDER — SAXAGLIPTIN HCL 2.5 MG PO TABS: 2.5000 mg | ORAL_TABLET | Freq: Every day | ORAL | Status: DC

## 2015-04-08 MED ORDER — METFORMIN HCL 1000 MG PO TABS: 1000.0000 mg | ORAL_TABLET | Freq: Two times a day (BID) | ORAL | Status: DC

## 2015-04-08 MED ORDER — GUAIFENESIN ER 1200 MG PO TB12: 1.0000 | ORAL_TABLET | Freq: Two times a day (BID) | ORAL | Status: DC | PRN

## 2015-04-08 NOTE — Progress Notes (Signed)
[facility] [address], [place] [address] [phone]  Date:  04/08/2015   Name:  Joseph Roberts   DOB:  [birth year]   MRN:  [id number]  PCP:  Minerva Ends, MD   Chief Complaint  Patient presents with  . Cough    1 week  . Medication Refill    metformin,onglyza  . Flu Vaccine    History of Present Illness:  Joseph Roberts is a 55 y.o. male patient who presents to [facility] for cc 1 week of cough and congestion, and medication refill.    --fatigued, mild cough, cough productive green sputum for 7 days.  No sob or dyspnea.  Sorethroat.  No otalgia.  Yesterday, subjective fever and chills.  No medication use at this time.  He feels as if it has not improved.    --He has been taking both metformin and onglyza as prescribed everyday.  He ran out today. --Dxd dm2 for about 4 years.  He does not check his blood sugar.  He will have occasional blurriness.  No polyuria, no nausea, tremulousness, no diarrhea.  No numbness or tingling of extremities.  Patient Active Problem List   Diagnosis Date Noted  . 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 Dyslipidemia with low high density lipoprotein (HDL) cholesterol with hypertriglyceridemia due to type 2 diabetes mellitus ([place]) 11/03/2013  . Gall bladder polyp 10/24/2013  . GERD (gastroesophageal reflux disease) 10/24/2013  . Type 2 diabetes mellitus ([place]) 10/23/2013    Past Medical History  Diagnosis Date  . Diabetes mellitus 2011  . High triglycerides     Past Surgical History  Procedure Laterality Date  . Colonoscopy N/A 10/24/2013    Procedure: COLONOSCOPY;  Surgeon: Ladene Artist, MD;  Location: [facility] ENDOSCOPY;  Service: Endoscopy;  Laterality: N/A;. diverticulosis, mild, small internal hemorrhoids    Social History  Substance Use Topics  . Smoking status: Current Every Day Smoker -- 0.50 packs/day for 30 years    Types: Cigarettes  . Smokeless tobacco: Never Used  . Alcohol Use: No    Family History  Problem  Relation Age of Onset  . Diabetes Mother   . Diabetes Sister   . Diabetes Brother     No Known Allergies  Medication list has been reviewed and updated.  Current Outpatient Prescriptions on File Prior to Visit  Medication Sig Dispense Refill  . Blood Glucose Monitoring Suppl (ONE TOUCH ULTRA MINI) W/DEVICE KIT 1 each by Does not apply route daily. Test fasting daily    . glucose blood test strip 1 each by Other route as needed for other. Test Strips One Touch Ultra Blue Test fasting daily    . metFORMIN (GLUCOPHAGE) 1000 MG tablet Take 1 tablet (1,000 mg total) by mouth 2 (two) times daily with a meal. 180 tablet 3  . naproxen sodium (ANAPROX DS) 550 MG tablet Take 1 tablet (550 mg total) by mouth 2 (two) times daily with a meal. 40 tablet 0  . pantoprazole (PROTONIX) 40 MG tablet Take 1 tablet (40 mg total) by mouth daily. 30 tablet 0  . saxagliptin HCl (ONGLYZA) 2.5 MG TABS tablet Take 1 tablet (2.5 mg total) by mouth daily. 30 tablet 3  . atorvastatin (LIPITOR) 40 MG tablet Take 1 tablet (40 mg total) by mouth daily. (Patient not taking: Reported on 04/08/2015) 90 tablet 3  . cyclobenzaprine (FLEXERIL) 10 MG tablet Take 1 tablet (10 mg total) by mouth 3 (three) times daily as needed for  muscle spasms. (Patient not taking: Reported on 04/08/2015) 30 tablet 0   No current facility-administered medications on file prior to visit.    ROS ROS otherwise unremarkable unless listed above.  Physical Examination: BP 122/78 mmHg  Pulse 67  Temp(Src) 98.2 F (36.8 C) (Oral)  Resp 16  Ht 5' 5.5" (1.664 m)  Wt 176 lb (79.833 kg)  BMI 28.83 kg/m2  SpO2 98% Ideal Body Weight: Weight in (lb) to have BMI = 25: 152.2  Physical Exam  Constitutional: He is oriented to person, place, and time. He appears well-developed and well-nourished. No distress.  HENT:  Head: Atraumatic.  Right Ear: External ear and ear canal normal. Tympanic membrane is retracted.  Left Ear: External ear and ear canal  normal. Tympanic membrane is retracted.  Nose: Mucosal edema and rhinorrhea present. Right sinus exhibits no maxillary sinus tenderness and no frontal sinus tenderness. Left sinus exhibits no maxillary sinus tenderness and no frontal sinus tenderness.  Mouth/Throat: No uvula swelling. No oropharyngeal exudate, posterior oropharyngeal edema or posterior oropharyngeal erythema.  Eyes: Conjunctivae, EOM and lids are normal. Pupils are equal, round, and reactive to light. Right eye exhibits normal extraocular motion. Left eye exhibits normal extraocular motion.  Neck: Trachea normal and full passive range of motion without pain. No edema and no erythema present.  Cardiovascular: Normal rate, regular rhythm and normal heart sounds.  Exam reveals no friction rub.   No murmur heard. Pulmonary/Chest: Effort normal. No respiratory distress. He has no decreased breath sounds. He has no wheezes. He has rhonchi.  Feet:  Right Foot:  Protective Sensation: 6 sites tested.6 sites sensed. Left Foot:  Protective Sensation: 6 sites tested. 6 sites sensed. Lymphadenopathy:    He has no cervical adenopathy.  Neurological: He is alert and oriented to person, place, and time.  Skin: Skin is warm and dry. He is not diaphoretic.  Psychiatric: He has a normal mood and affect. His behavior is normal.     Results for orders placed or performed in visit on 04/08/15  POCT glycosylated hemoglobin (Hb A1C)  Result Value Ref Range   Hemoglobin A1C 7.8   POCT glucose (manual entry)  Result Value Ref Range   POC Glucose 162 (A) 70 - 99 mg/dl       Assessment and Plan: Joseph Roberts is a 55 y.o. male who is here today for diabetes recheck, and complaint of cough.  Discussed use of glucometer.  Discussed better dieting and exercise.   Discussed dm complications when lifestyle modifications are not observed.  Encouraged this great decline of a1c.  On a good track.  Refilled, and advised to return in 3 months for  recheck. Type 2 diabetes mellitus with complication, without long-term current use of insulin ([place]) - Plan: POCT glycosylated hemoglobin (Hb A1C), POCT glucose (manual entry), COMPLETE METABOLIC PANEL WITH GFR, metFORMIN (GLUCOPHAGE) 1000 MG tablet, saxagliptin HCl (ONGLYZA) 2.5 MG TABS tablet  Type 2 diabetes mellitus without complication, without long-term current use of insulin ([facility])  Acute bronchitis, unspecified organism - Plan: azithromycin (ZITHROMAX) 250 MG tablet, Guaifenesin (MUCINEX MAXIMUM STRENGTH) 1200 MG TB12   Ivar Drape, PA-C [facility] 2/5/20179:52 PM

## 2015-04-08 NOTE — Patient Instructions (Addendum)
With bronchitis, please remember to drink plenty of water>64oz.  This will help loosen the mucus and help the mucinex to work.  I would like you to return in 3 months for follow up of your diabetes.   You can use over the counter delsym for the cough at this time.   Diabetes y Doroteo Glassman fsica (Diabetes and Exercise) Hacer actividad fsica con regularidad es muy importante. No se trata solo de [employer]. Tiene muchos otros beneficios, como por ejemplo:  Mejorar el estado fsico, la flexibilidad y la resistencia.  Aumenta la densidad sea.  Ayuda a Art gallery manager.  Disminuye la Art gallery manager.  Aumenta la fuerza muscular.  Reduce el estrs y las tensiones.  Mejora el estado de salud general. Las personas diabticas que realizan actividad fsica tienen beneficios adicionales debido al ejercicio:  Reduce el apetito.  El organismo mejora el uso del azcar (glucosa) de la [place].  Ayuda a disminuir o Engineer, maintenance (IT).  Disminuye la presin arterial.  Ayuda a disminuir los lpidos en la sangre (colesterol y triglicridos).  El organismo mejora el uso de la insulina porque:  Aumenta la sensibilidad del organismo a la insulina.  Reduce las necesidades de insulina del organismo.  Disminuye el riesgo de enfermedad cardaca por la actividad fsica ya que  disminuye el colesterol y [employer] triglicridos.  Aumenta los niveles de colesterol bueno (como las lipoprotenas de alta densidad [HDL]) en el organismo.  Disminuye los niveles de glucosa en la [place]. SU PLAN DE ACTIVIDAD  Elija una actividad que disfrute y establezca objetivos realistas. Para ejercitarse sin riesgos, debe comenzar a Education administrator cualquier actividad fsica nueva lentamente y aumentar la intensidad del ejercicio de forma gradual con el tiempo. Su mdico o educador en diabetes podrn ayudarlo a crear un plan de actividades que lo beneficie. Las recomendaciones generales incluyen lo  siguiente:  Air cabin crew a los nios para que realicen al menos 60 minutos de actividad fsica Management consultant.  Estirarse y Education officer, environmental ejercicios de entrenamiento de la fuerza, como yoga o levantamiento de pesas, por lo menos 2 veces por semana.  Realizar en total por lo menos 150 minutos de ejercicios de intensidad moderada cada semana, como caminar a paso ligero o hacer gimnasia acutica.  Hacer ejercicio fsico por lo menos 3 das por semana y no dejar pasar ms de 2 das seguidos sin ejercitarse.  Evitar los perodos largos de inactividad (90 minutos o ms tiempo). Cuando deba pasar mucho tiempo sentado, haga pausas frecuentes para caminar o estirarse. RECOMENDACIONES PARA REALIZAR EJERCICIOS CUANDO SE TIENE DIABETES TIPO 1 O TIPO 2   Controle la glucosa en la sangre antes de comenzar. Si el nivel de glucosa en la sangre es de ms de 240 mg/dl, controle las cetonas en la [place]. No haga actividad fsica si hay cetonas.  Evite inyectarse insulina en las zonas del cuerpo que ejercitar. Por ejemplo, evite inyectarse insulina en:  Los brazos, si juega al tenis.  Las piernas, si corre.  Lleve un registro de:  Los alimentos que consume antes y despus de Tour manager.  Los momentos esperables de picos de accin de la insulina.  Los niveles de glucosa en la sangre antes y despus de hacer ejercicios.  El tipo y cantidad de [place] fsica que Biomedical engineer.  Revise los registros con su mdico. El mdico lo ayudar a Environmental education officer pautas para ajustar la cantidad de alimento y las cantidades de insulina antes y despus de Radio producer ejercicios.  Si  toma insulina o agentes hipoglucemiantes por va oral, observe si hay signos y sntomas de hipoglucemia. Entre los que se incluyen:  Mareos.  Temblores.  Sudoracin.  Escalofros.  Confusin.  Beba gran cantidad de agua mientras hace ejercicios para evitar la deshidratacin o los golpes de Airline pilot. Durante la actividad fsica se pierde agua corporal que  se debe reponer.  Comente con su mdico antes de comenzar un programa de actividad fsica para verificar que sea seguro para usted. Recuerde, cualquier actividad es mejor que ninguna.   Esta informacin no tiene Theme park manager el consejo del mdico. Asegrese de hacerle al mdico cualquier pregunta que tenga.   Document Released: 03/11/2007 Document Revised: 07/06/2014 Elsevier Interactive Patient Education 2016 [employer].  La diabetes mellitus y los alimentos (Diabetes Mellitus and Food) Es importante que controle su nivel de azcar en la sangre (glucosa). El nivel de glucosa en sangre depende en gran medida de lo que usted come. Comer alimentos saludables en las cantidades [place] a lo largo del Futures trader, aproximadamente a la misma hora [employer], lo ayudar a Chief Operating Officer su nivel de Event organiser. Tambin puede ayudarlo a retrasar o Fish farm manager de la diabetes mellitus. Comer de [employer] saludable incluso puede ayudarlo a Event organiser de presin arterial y a Barista o Pharmacologist un peso saludable.  Entre las recomendaciones generales para alimentarse y Water quality scientist los alimentos de forma saludable, se incluyen las siguientes:  Respetar las comidas principales y comer colaciones con regularidad. Evitar pasar largos perodos sin comer con el fin de perder peso.  Seguir una dieta que consista principalmente en alimentos de origen vegetal, como frutas, vegetales, frutos secos, legumbres y cereales integrales.  Utilizar mtodos de coccin a baja temperatura, como hornear, en lugar de mtodos de coccin a alta temperatura, como frer en abundante aceite. Trabaje con el nutricionista para aprender a Acupuncturist nutricional de las etiquetas de los alimentos. CMO PUEDEN AFECTARME LOS ALIMENTOS? Carbohidratos Los carbohidratos afectan el nivel de glucosa en sangre ms que cualquier otro tipo de alimento. El nutricionista lo ayudar a Chief Strategy Officer cuntos carbohidratos puede  consumir en cada comida y ensearle a contarlos. El recuento de carbohidratos es importante para mantener la glucosa en sangre en un nivel saludable, en especial si utiliza insulina o toma determinados medicamentos para la diabetes mellitus. Alcohol El alcohol puede provocar disminuciones sbitas de la glucosa en sangre (hipoglucemia), en especial si utiliza insulina o toma determinados medicamentos para la diabetes mellitus. La hipoglucemia es una afeccin que puede poner en peligro la vida. Los sntomas de la hipoglucemia (somnolencia, mareos y Administrator) son similares a los sntomas de haber consumido mucho alcohol.  Si el mdico lo autoriza a beber alcohol, hgalo con moderacin y siga estas pautas:  Las mujeres no deben beber ms de un trago por da, y los hombres no deben beber ms de dos tragos por Futures trader. Un trago es igual a:  12 onzas (355 ml) de cerveza  5 onzas de vino (150 ml) de vino  1,5onzas (45ml) de bebidas espirituosas  No beba con el estmago vaco.  Mantngase hidratado. Beba agua, gaseosas dietticas o t helado sin azcar.  Las gaseosas comunes, los jugos y otros refrescos podran contener muchos carbohidratos y se Heritage manager. QU ALIMENTOS NO SE RECOMIENDAN? Cuando haga las elecciones de alimentos, es importante que recuerde que todos los alimentos son distintos. Algunos tienen menos nutrientes que otros por porcin, aunque podran tener la misma cantidad de caloras o carbohidratos. Es  difcil darle al cuerpo lo que necesita cuando consume alimentos con menos nutrientes. Estos son algunos ejemplos de alimentos que debera evitar ya que contienen muchas caloras y carbohidratos, pero pocos nutrientes:  Neurosurgeon trans (la mayora de los alimentos procesados incluyen grasas trans en la etiqueta de Informacin nutricional).  Gaseosas comunes.  Jugos.  Caramelos.  Dulces, como tortas, pasteles, rosquillas y [place].  Comidas fritas. QU ALIMENTOS PUEDO  COMER? Consuma alimentos ricos en nutrientes, que nutrirn el cuerpo y lo mantendrn saludable. Los alimentos que debe comer tambin dependern de varios factores, como:  Las caloras que necesita.  Los medicamentos que toma.  Su peso.  El nivel de glucosa en [place].  El Kimberly de presin arterial.  El nivel de colesterol. Debe consumir una amplia variedad de alimentos, por ejemplo:  Protenas.  Cortes de [employer].  Protenas con bajo contenido de grasas saturadas, como pescado, clara de huevo y frijoles. Evite las carnes procesadas.  Frutas y vegetales.  Frutas y Sports administrator que pueden ayudar a Chief Operating Officer los niveles sanguneos de [place], como [place], mangos y batatas.  Productos lcteos.  Elija productos lcteos sin grasa o con bajo contenido de [place], como [place], yogur y [place].  Cereales, panes, pastas y arroz.  Elija cereales integrales, como panes multicereales, avena en grano y arroz integral. Estos alimentos pueden ayudar a controlar la presin arterial.  Rosalin Hawking.  Alimentos que contengan grasas saludables, como frutos secos, Chartered certified accountant, aceite de [place], aceite de canola y pescado. TODOS LOS QUE PADECEN DIABETES MELLITUS TIENEN EL MISMO PLAN DE COMIDAS? Dado que todas las personas que padecen diabetes mellitus son distintas, no hay un solo plan de comidas que funcione para todos. Es muy importante que se rena con un nutricionista que lo ayudar a crear un plan de comidas adecuado para usted.   Esta informacin no tiene Theme park manager el consejo del mdico. Asegrese de hacerle al mdico cualquier pregunta que tenga.   Document Released: 05/29/2007 Document Revised: 03/12/2014 Elsevier Interactive Patient Education 2016 [employer]. Bronquitis aguda (Acute Bronchitis) La bronquitis es una inflamacin de las vas respiratorias que se extienden desde la trquea [employer] pulmones (bronquios). La inflamacin produce la formacin de mucosidad. Esto produce  tos, que es el sntoma ms frecuente de la bronquitis.  Cuando la bronquitis es [place], generalmente comienza de [place] sbita y desaparece luego de un par de semanas. El hbito de fumar, las alergias y el asma pueden empeorar la bronquitis. Los episodios repetidos de bronquitis pueden causar ms problemas pulmonares.  CAUSAS La causa ms frecuente de bronquitis aguda es el mismo virus que produce el resfro. El virus puede propagarse de Neomia Dear persona a la otra (contagioso) a travs de la tos y los estornudos, y al tocar objetos contaminados. SIGNOS Y SNTOMAS   Tos.  Grant Ruts.  Tos con mucosidad.  Dolores [employer]epsi[employer] cuerpo.  Congestin en el pecho.  Escalofros.  Falta de aire.  Dolor de Advertising copywriter. DIAGNSTICO  La bronquitis aguda en general se diagnostica con un examen fsico. El mdico tambin le har preguntas sobre su historia clnica. En algunos casos se indican otros estudios, como radiografas, para Risk manager.  TRATAMIENTO  La bronquitis aguda generalmente desaparece en un par de semanas. Con frecuencia, no es Quarry manager. Los medicamentos se indican para aliviar la fiebre o la tos. Generalmente, no es necesario el uso de antibiticos, pero pueden recetarse en ciertas ocasiones. En algunos casos, se recomienda el uso de un inhalador para mejorar la falta  de aire y Scientist, physiological tos. Un vaporizador de aire fro podr ayudarlo a [employer] bronquiales y Statistician su eliminacin.  INSTRUCCIONES PARA EL CUIDADO EN EL HOGAR  Descanse lo suficiente.  Beba lquidos en abundancia para mantener la orina de color claro o amarillo plido (excepto que padezca una enfermedad que requiera la restriccin de lquidos). El aumento de lquidos puede ayudar a que las secreciones respiratorias (esputo) sean menos espesas y a reducir la congestin del pecho, y Transport planner deshidratacin.  Tome los medicamentos solamente como se lo haya indicado el  mdico.  Si le recetaron antibiticos, asegrese de terminarlos, incluso si comienza a sentirse mejor.  Evite fumar o aspirar el humo de otros fumadores. La exposicin al humo del cigarrillo o a irritantes qumicos har que la bronquitis empeore. Si fuma, considere el uso de goma de Theatre manager o la aplicacin de parches en la piel que contengan nicotina para Paramedic los sntomas de abstinencia. Si deja de fumar, sus pulmones se curarn ms rpido.  Reduzca la probabilidad de otro episodio de bronquitis aguda lavando sus manos con frecuencia, evitando a las personas que tengan sntomas y tratando de no tocarse las manos con la boca, la nariz o los ojos.  Concurra a todas las visitas de control como se lo haya indicado el mdico. SOLICITE ATENCIN MDICA SI: Los sntomas no mejoran despus de una semana de [place].  SOLICITE ATENCIN MDICA DE INMEDIATO SI:  Comienza a tener fiebre o escalofros cada vez ms intensos.  Siente dolor en el pecho.  Le falta el aire de manera preocupante.  La flema tiene [place].  Se deshidrata.  Se desmaya o siente que va a desmayarse de forma repetida.  Tiene vmitos que se repiten.  Tiene un dolor de cabeza intenso. ASEGRESE DE QUE:   Comprende estas instrucciones.  Controlar su afeccin.  Recibir ayuda de inmediato si no mejora o si empeora.   Esta informacin no tiene Theme park manager el consejo del mdico. Asegrese de hacerle al mdico cualquier pregunta que tenga.   Document Released: 02/19/2005 Document Revised: 03/12/2014 Elsevier Interactive Patient Education [employer]

## 2015-07-11 ENCOUNTER — Ambulatory Visit (INDEPENDENT_AMBULATORY_CARE_PROVIDER_SITE_OTHER): Payer: 59 | Admitting: Family Medicine

## 2015-07-11 ENCOUNTER — Ambulatory Visit (INDEPENDENT_AMBULATORY_CARE_PROVIDER_SITE_OTHER): Payer: 59

## 2015-07-11 VITALS — BP 132/78 | HR 68 | Temp 98.0°F | Resp 18 | Ht 65.5 in | Wt 176.8 lb

## 2015-07-11 DIAGNOSIS — E119 Type 2 diabetes mellitus without complications: Secondary | ICD-10-CM

## 2015-07-11 DIAGNOSIS — R1011 Right upper quadrant pain: Secondary | ICD-10-CM

## 2015-07-11 DIAGNOSIS — R1030 Lower abdominal pain, unspecified: Secondary | ICD-10-CM

## 2015-07-11 DIAGNOSIS — J029 Acute pharyngitis, unspecified: Secondary | ICD-10-CM | POA: Diagnosis not present

## 2015-07-11 LAB — POC MICROSCOPIC URINALYSIS (UMFC): Mucus: ABSENT

## 2015-07-11 LAB — POCT URINALYSIS DIP (MANUAL ENTRY)
Bilirubin, UA: NEGATIVE
GLUCOSE UA: NEGATIVE
Ketones, POC UA: NEGATIVE
LEUKOCYTES UA: NEGATIVE
NITRITE UA: NEGATIVE
PH UA: 5
Protein Ur, POC: 100 — AB
Spec Grav, UA: 1.015
Urobilinogen, UA: 0.2

## 2015-07-11 LAB — COMPREHENSIVE METABOLIC PANEL
ALK PHOS: 124 U/L — AB (ref 40–115)
ALT: 18 U/L (ref 9–46)
AST: 13 U/L (ref 10–35)
Albumin: 4.1 g/dL (ref 3.6–5.1)
BILIRUBIN TOTAL: 0.7 mg/dL (ref 0.2–1.2)
BUN: 20 mg/dL (ref 7–25)
CO2: 22 mmol/L (ref 20–31)
CREATININE: 1.09 mg/dL (ref 0.70–1.33)
Calcium: 9.4 mg/dL (ref 8.6–10.3)
Chloride: 105 mmol/L (ref 98–110)
Glucose, Bld: 137 mg/dL — ABNORMAL HIGH (ref 65–99)
Potassium: 4.3 mmol/L (ref 3.5–5.3)
SODIUM: 136 mmol/L (ref 135–146)
TOTAL PROTEIN: 7.4 g/dL (ref 6.1–8.1)

## 2015-07-11 LAB — POCT CBC
Granulocyte percent: 64.5 %G (ref 37–80)
HCT, POC: 47.2 % (ref 43.5–53.7)
Hemoglobin: 17 g/dL (ref 14.1–18.1)
Lymph, poc: 2.8 (ref 0.6–3.4)
MCH, POC: 30.8 pg (ref 27–31.2)
MCHC: 36 g/dL — AB (ref 31.8–35.4)
MCV: 85.4 fL (ref 80–97)
MID (CBC): 0.5 (ref 0–0.9)
MPV: 8.8 fL (ref 0–99.8)
PLATELET COUNT, POC: 210 10*3/uL (ref 142–424)
POC Granulocyte: 5.9 (ref 2–6.9)
POC LYMPH %: 30.5 % (ref 10–50)
POC MID %: 5 % (ref 0–12)
RBC: 5.52 M/uL (ref 4.69–6.13)
RDW, POC: 13 %
WBC: 9.2 10*3/uL (ref 4.6–10.2)

## 2015-07-11 LAB — GLUCOSE, POCT (MANUAL RESULT ENTRY): POC Glucose: 135 mg/dl — AB (ref 70–99)

## 2015-07-11 LAB — POCT GLYCOSYLATED HEMOGLOBIN (HGB A1C): HEMOGLOBIN A1C: 7.6

## 2015-07-11 MED ORDER — GLIMEPIRIDE 2 MG PO TABS: 2.0000 mg | ORAL_TABLET | Freq: Every day | ORAL | Status: DC

## 2015-07-11 MED ORDER — DICYCLOMINE HCL 10 MG PO CAPS: ORAL_CAPSULE | ORAL | Status: DC

## 2015-07-11 NOTE — Patient Instructions (Addendum)
Take over-the-counter Claritin (loratadine) 1 daily for the sore throat  Drink lots of water  We are scheduling you for a ultrasound of your gallbladder again. Someone will contact you about that in the next few days  Take Bentyl ( dicyclomine) 10 mg 3 times daily when needed for pain from the gallbladder.  Someone will contact you about the reports on your ultrasound. If the pain gets worse or continues hurting you need to return or go to the emergency room if necessary.  Continue taking the metformin for the diabetes. However this is not keeping your blood sugar low enough. We will add Amaryl (glimepiride) 2 mg each morning for your diabetes also.  Return in 3 months for a recheck of the diabetes    IF you received an x-ray today, you will receive an invoice from [facility]. Please contact [facility] at [phone] with questions or concerns regarding your invoice.   IF you received labwork today, you will receive an invoice from [facility]. Please contact [facility] at [phone] with questions or concerns regarding your invoice.   Our billing staff will not be able to assist you with questions regarding bills from these companies.  You will be contacted with the lab results as soon as they are available. The fastest way to get your results is to activate your My Chart account. Instructions are located on the last page of this paperwork. If you have not heard from us regarding the results in 2 weeks, please contact this office.

## 2015-07-11 NOTE — Progress Notes (Signed)
Patient ID: Joseph Roberts, male    DOB: 06-23-1960  Age: 55 y.o. MRN: [id number]  Chief Complaint  Patient presents with  . Sore Throat  . Dysuria  . Abdominal Pain    Subjective:   55 year old man who comes in here with a six-day history of right upper quadrant abdominal pains. No nausea or vomiting. The pain comes and goes. He also has some low abdominal pain when he has tried to urinate after his bowels moved. He has had a sore throat and doesn't feel good. No one else at home is ill. He works maintenance for [employer]. He is Spanish-speaking but speaks adequate [place]. He has a history of diabetes, takes his medicines faithfully, followed up here.  Current allergies, medications, problem list, past/family and social histories reviewed.  Objective:  BP 132/78 mmHg  Pulse 68  Temp(Src) 98 F (36.7 C) (Oral)  Resp 18  Ht 5' 5.5" (1.664 m)  Wt 176 lb 12.8 oz (80.196 kg)  BMI 28.96 kg/m2  SpO2 98%  Healthy-appearing man in no major distress. TMs are normal. Throat erythematous without exudate. Neck supple without nodes. Chest is clear to auscultation. Has a little cough. Heart regular without murmur. Abdomen is soft without organomegaly or masses. Normal bowel sounds. He is tender in the right upper quadrant toward the epigastric region. Denies vomiting or heartburn. Lower abdomen is soft and benign feeling. No CVA tenderness.  Assessment & Plan:   Assessment: 1. RUQ abdominal pain   2. Sore throat   3. Lower abdominal pain   4. Type 2 diabetes mellitus without complication, without long-term current use of insulin (HCC)       Plan: Uncertain cause of his right upper quadrant pains. Need to consider gallbladder. Last alkaline phosphatase was a little high. Will check labs and consider gallbladder ultrasound.  Orders Placed This Encounter  Procedures  . DG Abd 2 Views    Order Specific Question:  Reason for Exam (SYMPTOM  OR DIAGNOSIS REQUIRED)    Answer:  ruq pain    Order  Specific Question:  Preferred imaging location?    Answer:  External  . Comprehensive metabolic panel  . POCT CBC  . POCT glucose (manual entry)  . POCT glycosylated hemoglobin (Hb A1C)  . POCT Microscopic Urinalysis ([facility])  . POCT urinalysis dipstick    Meds ordered this encounter  Medications  . dicyclomine (BENTYL) 10 MG capsule    Sig: Take 1 pill 3 times daily if needed for abdominal pain    Dispense:  30 capsule    Refill:  1  . glimepiride (AMARYL) 2 MG tablet    Sig: Take 1 tablet (2 mg total) by mouth daily before breakfast.    Dispense:  30 tablet    Refill:  3   Results for orders placed or performed in visit on 07/11/15  POCT CBC  Result Value Ref Range   WBC 9.2 4.6 - 10.2 K/uL   Lymph, poc 2.8 0.6 - 3.4   POC LYMPH PERCENT 30.5 10 - 50 %L   MID (cbc) 0.5 0 - 0.9   POC MID % 5.0 0 - 12 %M   POC Granulocyte 5.9 2 - 6.9   Granulocyte percent 64.5 37 - 80 %G   RBC 5.52 4.69 - 6.13 M/uL   Hemoglobin 17.0 14.1 - 18.1 g/dL   HCT, POC 40.947.2 81.143.5 - 53.7 %   MCV 85.4 80 - 97 fL   MCH, POC 30.8 27 - 31.2 pg  MCHC 36.0 (A) 31.8 - 35.4 g/dL   RDW, POC 16.1 %   Platelet Count, POC 210 142 - 424 K/uL   MPV 8.8 0 - 99.8 fL  POCT glucose (manual entry)  Result Value Ref Range   POC Glucose 135 (A) 70 - 99 mg/dl  POCT glycosylated hemoglobin (Hb A1C)  Result Value Ref Range   Hemoglobin A1C 7.6   POCT Microscopic Urinalysis (UM[facility])  Result Value Ref Range   WBC,UR,HPF,POC Few (A) None WBC/hpf   RBC,UR,HPF,POC Few (A) None RBC/hpf   Bacteria None None, Too numerous to count   Mucus Absent Absent   Epithelial Cells, UR Per Microscopy Few (A) None, Too numerous to count cells/hpf  POCT urinalysis dipstick  Result Value Ref Range   Color, UA yellow yellow   Clarity, UA clear clear   Glucose, UA negative negative   Bilirubin, UA negative negative   Ketones, POC UA negative negative   Spec Grav, UA 1.015    Blood, UA trace-lysed (A) negative   pH, UA 5.0    Protein  Ur, POC =100 (A) negative   Urobilinogen, UA 0.2    Nitrite, UA Negative Negative   Leukocytes, UA Negative Negative         Patient Instructions   Take over-the-counter Claritin (loratadine) 1 daily for the sore throat  Drink lots of water  We are scheduling you for a ultrasound of your gallbladder again. Someone will contact you about that in the next few days  Take Bentyl ( dicyclomine) 10 mg 3 times daily when needed for pain from the gallbladder.  Someone will contact you about the reports on your ultrasound. If the pain gets worse or continues hurting you need to return or go to the emergency room if necessary.  Continue taking the metformin for the diabetes. However this is not keeping your blood sugar low enough. We will add Amaryl (glimepiride) 2 mg each morning for your diabetes also.  Return in 3 months for a recheck of the diabetes    IF you received an x-ray today, you will receive an invoice from [facility]. Please contact [facility] at [phone] with questions or concerns regarding your invoice.   IF you received labwork today, you will receive an invoice from [facility]. Please contact [facility] at [phone] with questions or concerns regarding your invoice.   Our billing staff will not be able to assist you with questions regarding bills from these companies.  You will be contacted with the lab results as soon as they are available. The fastest way to get your results is to activate your My Chart account. Instructions are located on the last page of this paperwork. If you have not heard from [place] regarding the results in 2 weeks, please contact this office.          Return in about 3 months (around 10/11/2015).   HOPPER,DAVID, MD 07/11/2015

## 2015-07-13 ENCOUNTER — Encounter: Payer: Self-pay | Admitting: *Deleted

## 2015-10-17 ENCOUNTER — Encounter: Payer: Self-pay | Admitting: Physician Assistant

## 2015-10-17 ENCOUNTER — Ambulatory Visit (INDEPENDENT_AMBULATORY_CARE_PROVIDER_SITE_OTHER): Payer: 59 | Admitting: Physician Assistant

## 2015-10-17 VITALS — BP 122/72 | HR 67 | Temp 98.0°F | Resp 17 | Ht 67.0 in | Wt 176.0 lb

## 2015-10-17 DIAGNOSIS — J029 Acute pharyngitis, unspecified: Secondary | ICD-10-CM | POA: Diagnosis not present

## 2015-10-17 DIAGNOSIS — R12 Heartburn: Secondary | ICD-10-CM | POA: Diagnosis not present

## 2015-10-17 DIAGNOSIS — E786 Lipoprotein deficiency: Secondary | ICD-10-CM

## 2015-10-17 DIAGNOSIS — E781 Pure hyperglyceridemia: Secondary | ICD-10-CM | POA: Diagnosis not present

## 2015-10-17 DIAGNOSIS — R21 Rash and other nonspecific skin eruption: Secondary | ICD-10-CM | POA: Diagnosis not present

## 2015-10-17 DIAGNOSIS — E782 Mixed hyperlipidemia: Principal | ICD-10-CM

## 2015-10-17 DIAGNOSIS — E113499 Type 2 diabetes mellitus with severe nonproliferative diabetic retinopathy without macular edema, unspecified eye: Secondary | ICD-10-CM | POA: Diagnosis not present

## 2015-10-17 DIAGNOSIS — E119 Type 2 diabetes mellitus without complications: Secondary | ICD-10-CM

## 2015-10-17 DIAGNOSIS — Z1159 Encounter for screening for other viral diseases: Secondary | ICD-10-CM

## 2015-10-17 DIAGNOSIS — E118 Type 2 diabetes mellitus with unspecified complications: Secondary | ICD-10-CM

## 2015-10-17 DIAGNOSIS — E1169 Type 2 diabetes mellitus with other specified complication: Secondary | ICD-10-CM

## 2015-10-17 LAB — POCT GLYCOSYLATED HEMOGLOBIN (HGB A1C): HEMOGLOBIN A1C: 7.3

## 2015-10-17 LAB — LIPID PANEL
CHOL/HDL RATIO: 6.4 ratio — AB (ref <=5.0)
Cholesterol: 198 mg/dL (ref 125–200)
HDL: 31 mg/dL — AB (ref >=40)
Triglycerides: 757 mg/dL — ABNORMAL HIGH (ref <150)

## 2015-10-17 LAB — COMPLETE METABOLIC PANEL WITH GFR
ALBUMIN: 3.9 g/dL (ref 3.6–5.1)
ALK PHOS: 113 U/L (ref 40–115)
ALT: 19 U/L (ref 9–46)
AST: 16 U/L (ref 10–35)
BUN: 12 mg/dL (ref 7–25)
CHLORIDE: 107 mmol/L (ref 98–110)
CO2: 23 mmol/L (ref 20–31)
Calcium: 9.5 mg/dL (ref 8.6–10.3)
Creat: 0.78 mg/dL (ref 0.70–1.33)
GFR, Est African American: >89 mL/min (ref >=60)
GLUCOSE: 139 mg/dL — AB (ref 65–99)
POTASSIUM: 3.8 mmol/L (ref 3.5–5.3)
SODIUM: 138 mmol/L (ref 135–146)
Total Bilirubin: 0.6 mg/dL (ref 0.2–1.2)
Total Protein: 7.1 g/dL (ref 6.1–8.1)

## 2015-10-17 LAB — HEPATITIS C ANTIBODY: HCV Ab: NEGATIVE

## 2015-10-17 LAB — CBC WITH DIFFERENTIAL/PLATELET
BASOS ABS: 75 {cells}/uL (ref 0–200)
BASOS PCT: 1 %
EOS ABS: 225 {cells}/uL (ref 15–500)
EOS PCT: 3 %
HCT: 46 % (ref 38.5–50.0)
HEMOGLOBIN: 15.9 g/dL (ref 13.2–17.1)
Lymphs Abs: 2325 cells/uL (ref 850–3900)
MCH: 30.5 pg (ref 27.0–33.0)
MCHC: 34.6 g/dL (ref 32.0–36.0)
MCV: 88.1 fL (ref 80.0–100.0)
MONOS PCT: 10 %
MPV: 11.1 fL (ref 7.5–12.5)
Monocytes Absolute: 750 cells/uL (ref 200–950)
NEUTROS PCT: 55 %
Neutro Abs: 4125 cells/uL (ref 1500–7800)
PLATELETS: 217 10*3/uL (ref 140–400)
RBC: 5.22 MIL/uL (ref 4.20–5.80)
RDW: 13.2 % (ref 11.0–15.0)
WBC: 7.5 10*3/uL (ref 3.8–10.8)

## 2015-10-17 LAB — MICROALBUMIN, URINE: MICROALB UR: 34.9 mg/dL

## 2015-10-17 MED ORDER — SAXAGLIPTIN HCL 2.5 MG PO TABS: 2.5000 mg | ORAL_TABLET | Freq: Every day | ORAL | 3 refills | Status: DC

## 2015-10-17 MED ORDER — ATORVASTATIN CALCIUM 40 MG PO TABS: 40.0000 mg | ORAL_TABLET | Freq: Every day | ORAL | 0 refills | Status: DC

## 2015-10-17 MED ORDER — TRIAMCINOLONE ACETONIDE 0.5 % EX CREA: 1.0000 "application " | TOPICAL_CREAM | Freq: Two times a day (BID) | CUTANEOUS | 0 refills | Status: DC

## 2015-10-17 MED ORDER — PANTOPRAZOLE SODIUM 40 MG PO TBEC: 40.0000 mg | DELAYED_RELEASE_TABLET | Freq: Every day | ORAL | 0 refills | Status: DC

## 2015-10-17 MED ORDER — GLIMEPIRIDE 2 MG PO TABS: 2.0000 mg | ORAL_TABLET | Freq: Every day | ORAL | 3 refills | Status: DC

## 2015-10-17 MED ORDER — FENOFIBRATE 145 MG PO TABS: 145.0000 mg | ORAL_TABLET | Freq: Every day | ORAL | 2 refills | Status: DC

## 2015-10-17 MED ORDER — METFORMIN HCL 1000 MG PO TABS: 1000.0000 mg | ORAL_TABLET | Freq: Two times a day (BID) | ORAL | 3 refills | Status: DC

## 2015-10-17 MED ORDER — LISINOPRIL 5 MG PO TABS: 5.0000 mg | ORAL_TABLET | Freq: Every day | ORAL | 3 refills | Status: DC

## 2015-10-17 NOTE — Progress Notes (Signed)
Joseph Roberts  MRN: [id number] DOB: [birth year]  Subjective:  Pt presents to clinic for medication check and sore throat.  1- DM - Home glucose around 130 - fasting - no problems with his medications  2- elevated cholesterol - on medications and having no problems  3- sore throat for 6 days - it is worse in the am and then improves as the day goes on - he has no nasal congestion - he was coughing up some green sputum throughout the day but that has gotten better - he has taken no medication at home - he has no problems with allergies this time of year - he has been out of his protonix for about 2 months - his heartburn has been really bad since running out of this medication  Pt has never seen an ophthalmologist -    Review of Systems  Constitutional: Negative for chills and fever.  Respiratory: Negative for cough and shortness of breath.   Cardiovascular: Negative for chest pain, palpitations and leg swelling.  Neurological: Negative for numbness.    Patient Active Problem List   Diagnosis Date Noted  . 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 Gall bladder polyp 10/24/2013  . GERD (gastroesophageal reflux disease) 10/24/2013  . Type 2 diabetes mellitus ([place]) 10/23/2013    Current Outpatient Prescriptions on File Prior to Visit  Medication Sig Dispense Refill  . atorvastatin (LIPITOR) 40 MG tablet Take 1 tablet (40 mg total) by mouth daily. 90 tablet 3  . Blood Glucose Monitoring Suppl (ONE TOUCH ULTRA MINI) W/DEVICE KIT 1 each by Does not apply route daily. Test fasting daily    . cyclobenzaprine (FLEXERIL) 10 MG tablet Take 1 tablet (10 mg total) by mouth 3 (three) times daily as needed for muscle spasms. 30 tablet 0  . dicyclomine (BENTYL) 10 MG capsule Take 1 pill 3 times daily if needed for abdominal pain 30 capsule 1  . glimepiride (AMARYL)  2 MG tablet Take 1 tablet (2 mg total) by mouth daily before breakfast. 30 tablet 3  . glucose blood test strip 1 each by Other route as needed for other. Test Strips One Touch Ultra Blue Test fasting daily    . Guaifenesin (MUCINEX MAXIMUM STRENGTH) 1200 MG TB12 Take 1 tablet (1,200 mg total) by mouth every 12 (twelve) hours as needed. 14 tablet 1  . metFORMIN (GLUCOPHAGE) 1000 MG tablet Take 1 tablet (1,000 mg total) by mouth 2 (two) times daily with a meal. 180 tablet 3  . pantoprazole (PROTONIX) 40 MG tablet Take 1 tablet (40 mg total) by mouth daily. 30 tablet 0  . saxagliptin HCl (ONGLYZA) 2.5 MG TABS tablet Take 1 tablet (2.5 mg total) by mouth daily. 30 tablet 3   No current facility-administered medications on file prior to visit.     No Known Allergies  Pt patients past, family and social history were reviewed and updated.  Objective:  BP 122/72 (BP Location: Right Arm, Patient Position: Sitting, Cuff Size: Normal)   Pulse 67   Temp 98 F (36.7 C) (Oral)   Resp 17   Ht '5\' 7"'  (1.702 m)   Wt 176 lb (79.8 kg)   SpO2 98%   BMI 27.57 kg/m   Physical Exam  Constitutional: He is oriented to person, place, and time and well-developed, well-nourished, and in no distress.  HENT:  Head: Normocephalic and atraumatic.  Right Ear: External ear normal.  Left Ear: External ear normal.  Eyes: Conjunctivae are normal.  Neck: Normal range of motion.  Cardiovascular: Normal rate, regular rhythm and normal heart sounds.   Pulmonary/Chest: Effort normal and breath sounds normal.  Neurological: He is alert and oriented to person, place, and time. Gait normal.  Skin: Skin is warm and dry.  Diabetic Foot Exam - Simple   Simple Foot Form Diabetic Foot exam was performed with the following findings:  Yes  10/17/2015 10:39 AM  Visual Inspection No deformities, no ulcerations, no other skin breakdown bilaterally:  Yes Sensation Testing Intact to touch and monofilament testing bilaterally:   Yes Pulse Check Posterior Tibialis and Dorsalis pulse intact bilaterally:  Yes Comments Left foot had a 58m regular border nevi consistent color.      Psychiatric: Mood, memory, affect and judgment normal.    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, without long-term current use of insulin, macular edema presence unspecified, unspecified laterality - Plan: lisinopril (PRINIVIL,ZESTRIL) 5 MG tablet, POCT glycosylated hemoglobin (Hb A1C), Microalbumin, urine, HM DIABETES FOOT EXAM - continue the same medications at this time as the patient has good DM control - Lisinopril was started at a low dose for kidney protection - encouraged pt to see an ophthalmologist  Dyslipidemia with low high density lipoprotein (HDL) cholesterol with hypertriglyceridemia due to type 2 diabetes mellitus ([place] - Plan: CBC with Differential/Platelet, Lipid panel - medication will be sent in once his labs are returned  Heartburn - refill protonix - except this to be the possible cause of his sore throat -   Sore throat - Plan: COMPLETE METABOLIC PANEL WITH GFR  Need for hepatitis C screening test - Plan: Hepatitis C antibody  SWindell HummingbirdPA-C  [facility] 10/17/2015 10:53 AM

## 2015-10-17 NOTE — Patient Instructions (Signed)
     IF you received an x-ray today, you will receive an invoice from [facility]. Please contact [facility] at [phone] with questions or concerns regarding your invoice.   IF you received labwork today, you will receive an invoice from [facility]. Please contact [facility] at [phone] with questions or concerns regarding your invoice.   Our billing staff will not be able to assist you with questions regarding bills from these companies.  You will be contacted with the lab results as soon as they are available. The fastest way to get your results is to activate your My Chart account. Instructions are located on the last page of this paperwork. If you have not heard from us regarding the results in 2 weeks, please contact this office.      

## 2015-10-17 NOTE — Addendum Note (Signed)
Addended by: Morrell RiddleWEBER, Saron Vanorman L on: 10/17/2015 03:33 PM   Modules accepted: Orders

## 2015-10-18 ENCOUNTER — Telehealth: Payer: Self-pay

## 2015-10-18 MED ORDER — FENOFIBRATE 160 MG PO TABS: 160.0000 mg | ORAL_TABLET | Freq: Every day | ORAL | 0 refills | Status: DC

## 2015-10-18 NOTE — Telephone Encounter (Signed)
Pharm faxed notice that ins doesn't cover the 145 mg of fenofibrate. Evidently the 54, 60,  or 160 mg are covered. I have pended the 160 since it is closest. [facility] change?

## 2015-10-18 NOTE — Telephone Encounter (Signed)
done

## 2016-03-05 ENCOUNTER — Encounter (HOSPITAL_COMMUNITY): Payer: Self-pay | Admitting: *Deleted

## 2016-03-05 ENCOUNTER — Observation Stay (HOSPITAL_COMMUNITY)
Admission: EM | Admit: 2016-03-05 | Discharge: 2016-03-08 | Disposition: A | Discharge status: Home / Self Care | Payer: 59 | Attending: Internal Medicine | Admitting: Internal Medicine

## 2016-03-05 ENCOUNTER — Observation Stay (HOSPITAL_COMMUNITY): Payer: 59

## 2016-03-05 DIAGNOSIS — E1101 Type 2 diabetes mellitus with hyperosmolarity with coma: Secondary | ICD-10-CM | POA: Diagnosis not present

## 2016-03-05 DIAGNOSIS — E785 Hyperlipidemia, unspecified: Secondary | ICD-10-CM | POA: Insufficient documentation

## 2016-03-05 DIAGNOSIS — N179 Acute kidney failure, unspecified: Secondary | ICD-10-CM

## 2016-03-05 DIAGNOSIS — K219 Gastro-esophageal reflux disease without esophagitis: Secondary | ICD-10-CM | POA: Diagnosis not present

## 2016-03-05 DIAGNOSIS — R739 Hyperglycemia, unspecified: Secondary | ICD-10-CM | POA: Diagnosis present

## 2016-03-05 DIAGNOSIS — F17210 Nicotine dependence, cigarettes, uncomplicated: Secondary | ICD-10-CM | POA: Insufficient documentation

## 2016-03-05 DIAGNOSIS — I10 Essential (primary) hypertension: Secondary | ICD-10-CM | POA: Diagnosis not present

## 2016-03-05 DIAGNOSIS — Z7984 Long term (current) use of oral hypoglycemic drugs: Secondary | ICD-10-CM | POA: Insufficient documentation

## 2016-03-05 DIAGNOSIS — R339 Retention of urine, unspecified: Secondary | ICD-10-CM

## 2016-03-05 DIAGNOSIS — E1165 Type 2 diabetes mellitus with hyperglycemia: Secondary | ICD-10-CM | POA: Diagnosis not present

## 2016-03-05 DIAGNOSIS — IMO0002 Reserved for concepts with insufficient information to code with codable children: Secondary | ICD-10-CM

## 2016-03-05 DIAGNOSIS — R3915 Urgency of urination: Secondary | ICD-10-CM | POA: Diagnosis not present

## 2016-03-05 DIAGNOSIS — R102 Pelvic and perineal pain: Secondary | ICD-10-CM | POA: Diagnosis not present

## 2016-03-05 DIAGNOSIS — E118 Type 2 diabetes mellitus with unspecified complications: Secondary | ICD-10-CM

## 2016-03-05 LAB — BASIC METABOLIC PANEL
ANION GAP: 7 (ref 5–15)
Anion gap: 9 (ref 5–15)
BUN: 11 mg/dL (ref 6–20)
BUN: 11 mg/dL (ref 6–20)
CALCIUM: 8.4 mg/dL — AB (ref 8.9–10.3)
CO2: 20 mmol/L — AB (ref 22–32)
CO2: 20 mmol/L — ABNORMAL LOW (ref 22–32)
Calcium: 9.2 mg/dL (ref 8.9–10.3)
Chloride: 95 mmol/L — ABNORMAL LOW (ref 101–111)
Chloride: 107 mmol/L (ref 101–111)
Creatinine, Ser: 1.24 mg/dL (ref 0.61–1.24)
Creatinine, Ser: 0.94 mg/dL (ref 0.61–1.24)
GFR calc Af Amer: >60 mL/min (ref >60)
GFR calc non Af Amer: >60 mL/min (ref >60)
GFR calc non Af Amer: >60 mL/min (ref >60)
GLUCOSE: 804 mg/dL — AB (ref 65–99)
GLUCOSE: 392 mg/dL — AB (ref 65–99)
POTASSIUM: 4.9 mmol/L (ref 3.5–5.1)
Potassium: 3.6 mmol/L (ref 3.5–5.1)
SODIUM: 134 mmol/L — AB (ref 135–145)
Sodium: 124 mmol/L — ABNORMAL LOW (ref 135–145)

## 2016-03-05 LAB — CBC
HEMATOCRIT: 41.9 % (ref 39.0–52.0)
Hemoglobin: 15 g/dL (ref 13.0–17.0)
MCH: 30.4 pg (ref 26.0–34.0)
MCHC: 35.8 g/dL (ref 30.0–36.0)
MCV: 85 fL (ref 78.0–100.0)
Platelets: 225 10*3/uL (ref 150–400)
RBC: 4.93 MIL/uL (ref 4.22–5.81)
RDW: 12.6 % (ref 11.5–15.5)
WBC: 8.4 10*3/uL (ref 4.0–10.5)

## 2016-03-05 LAB — URINALYSIS, ROUTINE W REFLEX MICROSCOPIC
Bacteria, UA: NONE SEEN
Bilirubin Urine: NEGATIVE
Hgb urine dipstick: NEGATIVE
KETONES UR: 5 mg/dL — AB
LEUKOCYTES UA: NEGATIVE
NITRITE: NEGATIVE
PH: 6 (ref 5.0–8.0)
Protein, ur: NEGATIVE mg/dL
SQUAMOUS EPITHELIAL / LPF: NONE SEEN
Specific Gravity, Urine: 1.026 (ref 1.005–1.030)

## 2016-03-05 LAB — CBG MONITORING, ED
GLUCOSE-CAPILLARY: 355 mg/dL — AB (ref 65–99)
Glucose-Capillary: >600 mg/dL (ref 65–99)
Glucose-Capillary: 467 mg/dL — ABNORMAL HIGH (ref 65–99)
Glucose-Capillary: 270 mg/dL — ABNORMAL HIGH (ref 65–99)

## 2016-03-05 LAB — GLUCOSE, CAPILLARY: GLUCOSE-CAPILLARY: 222 mg/dL — AB (ref 65–99)

## 2016-03-05 MED ORDER — DEXTROSE 50 % IV SOLN: 25.0000 mL | INTRAVENOUS | Status: DC | PRN

## 2016-03-05 MED ORDER — GUAIFENESIN ER 600 MG PO TB12: 1200.0000 mg | ORAL_TABLET | Freq: Two times a day (BID) | ORAL | Status: DC | PRN

## 2016-03-05 MED ORDER — SODIUM CHLORIDE 0.9 % IV BOLUS (SEPSIS)
2000.0000 mL | Freq: Once | INTRAVENOUS | Status: AC
  Administered 2016-03-05: 2000 mL via INTRAVENOUS

## 2016-03-05 MED ORDER — ALBUTEROL SULFATE (2.5 MG/3ML) 0.083% IN NEBU: 2.5000 mg | INHALATION_SOLUTION | RESPIRATORY_TRACT | Status: DC | PRN

## 2016-03-05 MED ORDER — ACETAMINOPHEN 325 MG PO TABS
650.0000 mg | ORAL_TABLET | Freq: Four times a day (QID) | ORAL | Status: DC | PRN
  Administered 2016-03-07: 650 mg via ORAL
  Filled 2016-03-05: qty 2

## 2016-03-05 MED ORDER — ENOXAPARIN SODIUM 40 MG/0.4ML ~~LOC~~ SOLN
40.0000 mg | Freq: Every day | SUBCUTANEOUS | Status: DC
  Administered 2016-03-06 – 2016-03-07 (×3): 40 mg via SUBCUTANEOUS
  Filled 2016-03-05 (×4): qty 0.4

## 2016-03-05 MED ORDER — SODIUM CHLORIDE 0.9 % IV SOLN
INTRAVENOUS | Status: DC
  Administered 2016-03-05: 5.4 [IU]/h via INTRAVENOUS
  Filled 2016-03-05: qty 2.5

## 2016-03-05 MED ORDER — DEXTROSE-NACL 5-0.45 % IV SOLN
INTRAVENOUS | Status: DC
  Administered 2016-03-06: via INTRAVENOUS

## 2016-03-05 MED ORDER — SODIUM CHLORIDE 0.9 % IV SOLN
INTRAVENOUS | Status: DC
  Filled 2016-03-05: qty 2.5

## 2016-03-05 MED ORDER — INSULIN REGULAR BOLUS VIA INFUSION
0.0000 [IU] | Freq: Three times a day (TID) | INTRAVENOUS | Status: DC
  Filled 2016-03-05: qty 10

## 2016-03-05 MED ORDER — ONDANSETRON HCL 4 MG/2ML IJ SOLN: 4.0000 mg | Freq: Four times a day (QID) | INTRAMUSCULAR | Status: DC | PRN

## 2016-03-05 MED ORDER — SODIUM CHLORIDE 0.9 % IV SOLN: INTRAVENOUS | Status: DC

## 2016-03-05 MED ORDER — ONDANSETRON HCL 4 MG PO TABS: 4.0000 mg | ORAL_TABLET | Freq: Four times a day (QID) | ORAL | Status: DC | PRN

## 2016-03-05 MED ORDER — ACETAMINOPHEN 650 MG RE SUPP: 650.0000 mg | Freq: Four times a day (QID) | RECTAL | Status: DC | PRN

## 2016-03-05 MED ORDER — LISINOPRIL 2.5 MG PO TABS
5.0000 mg | ORAL_TABLET | Freq: Every day | ORAL | Status: DC
  Administered 2016-03-06: 5 mg via ORAL
  Filled 2016-03-05: qty 2

## 2016-03-05 MED ORDER — DEXTROSE-NACL 5-0.45 % IV SOLN: INTRAVENOUS | Status: DC

## 2016-03-05 MED ORDER — PANTOPRAZOLE SODIUM 40 MG PO TBEC
40.0000 mg | DELAYED_RELEASE_TABLET | Freq: Every day | ORAL | Status: DC
  Administered 2016-03-06 – 2016-03-08 (×4): 40 mg via ORAL
  Filled 2016-03-05 (×3): qty 1

## 2016-03-05 MED ORDER — TERAZOSIN HCL 1 MG PO CAPS
1.0000 mg | ORAL_CAPSULE | Freq: Every day | ORAL | Status: DC
  Administered 2016-03-06: 1 mg via ORAL
  Filled 2016-03-05 (×3): qty 1

## 2016-03-05 MED ORDER — SODIUM CHLORIDE 0.9 % IV SOLN
INTRAVENOUS | Status: DC
  Administered 2016-03-05: 23:00:00 via INTRAVENOUS

## 2016-03-05 NOTE — H&P (Signed)
History and Physical    Joseph Roberts YSA:[id number] DOB: [birth year] DOA: 03/05/2016  Referring MD/NP/PA: Dr. Dominic Pea PCP: Minerva Ends, MD  Patient coming from:    Chief Complaint: Inability to urinate  HPI: Joseph Roberts is a 56 y.o. male with medical history significant of diabetes mellitus type 2 ; who presents with complaints of 3 weeks of inability to urinate. Spanish interpreter services utilized to obtain history. He complains of urinary frequency, increased thirst, and suprapubic pain pain with urination. He feels as though his bladder has not completely emptying out. He denies ever having symptoms like this previously in the past. Previously, seen by his primary care doctor who started him on terazosin, but the patient denies any significant change in symptoms.He reports trying to drink more water without relief of symptoms.  He denies any fever, chills, cough, shortness of breath, nausea, vomiting, or diarrhea.  ED Course: Upon admission into the emergency department patient was found to be afebrile with vital signs relatively within normal limits. Lab work revealed glucose 804 with normal anion gap. Urinalysis was negative for ketones and positive for glucose. He was started on the glucose stabilizes and a Foley catheter was placed after patient was seen to have significant urinary retention.  Review of Systems: As per HPI otherwise 10 point review of systems negative.   Past Medical History:  Diagnosis Date  . Diabetes mellitus 2011  . High triglycerides     Past Surgical History:  Procedure Laterality Date  . COLONOSCOPY N/A 10/24/2013   Procedure: COLONOSCOPY;  Surgeon: Ladene Artist, MD;  Location: [facility] ENDOSCOPY;  Service: Endoscopy;  Laterality: N/A;. diverticulosis, mild, small internal hemorrhoids     reports that he has been smoking Cigarettes.  He has a 15.00 pack-year smoking history. He has never used smokeless tobacco. He reports that he does not drink alcohol or  use drugs.  No Known Allergies  Family History  Problem Relation Age of Onset  . Diabetes Mother   . Diabetes Sister   . Diabetes Brother     Prior to Admission medications   Medication Sig Start Date End Date Taking? Authorizing Provider  atorvastatin (LIPITOR) 40 MG tablet Take 1 tablet (40 mg total) by mouth daily. 10/17/15   Mancel Bale, PA-C  Blood Glucose Monitoring Suppl (ONE TOUCH ULTRA MINI) W/DEVICE KIT 1 each by Does not apply route daily. Test fasting daily    Historical Provider, MD  cyclobenzaprine (FLEXERIL) 10 MG tablet Take 1 tablet (10 mg total) by mouth 3 (three) times daily as needed for muscle spasms. 06/14/14   Roselee Culver, MD  dicyclomine (BENTYL) 10 MG capsule Take 1 pill 3 times daily if needed for abdominal pain 07/11/15   Posey Boyer, MD  fenofibrate 160 MG tablet Take 1 tablet (160 mg total) by mouth daily. 10/18/15   Mancel Bale, PA-C  glimepiride (AMARYL) 2 MG tablet Take 1 tablet (2 mg total) by mouth daily before breakfast. 10/17/15   Mancel Bale, PA-C  glucose blood test strip 1 each by Other route as needed for other. Test Strips One Touch Ultra Blue Test fasting daily    Historical Provider, MD  Guaifenesin (MUCINEX MAXIMUM STRENGTH) 1200 MG TB12 Take 1 tablet (1,200 mg total) by mouth every 12 (twelve) hours as needed. 04/08/15   Dorian Heckle English, PA  lisinopril (PRINIVIL,ZESTRIL) 5 MG tablet Take 1 tablet (5 mg total) by mouth daily. 10/17/15   Mancel Bale, PA-C  metFORMIN (  GLUCOPHAGE) 1000 MG tablet Take 1 tablet (1,000 mg total) by mouth 2 (two) times daily with a meal. 10/17/15   Mancel Bale, PA-C  pantoprazole (PROTONIX) 40 MG tablet Take 1 tablet (40 mg total) by mouth daily. 10/17/15   Mancel Bale, PA-C  saxagliptin HCl (ONGLYZA) 2.5 MG TABS tablet Take 1 tablet (2.5 mg total) by mouth daily. 10/17/15   Mancel Bale, PA-C  triamcinolone cream (KENALOG) 0.5 % Apply 1 application topically 2 (two) times daily. 10/17/15   Mancel Bale,  PA-C    Physical Exam:   Constitutional: NAD, calm, comfortable Vitals:   03/05/16 1731  BP: 137/93  Pulse: 72  Temp: 98.7 F (37.1 C)  TempSrc: Oral  SpO2: 99%   Eyes: PERRL, lids and conjunctivae normal ENMT: Mucous membranes are moist. Posterior pharynx clear of any exudate or lesions.Normal dentition.  Neck: normal, supple, no masses, no thyromegaly Respiratory: clear to auscultation bilaterally, no wheezing, no crackles. Normal respiratory effort. No accessory muscle use.  Cardiovascular: Regular rate and rhythm, no murmurs / rubs / gallops. No extremity edema. 2+ pedal pulses. No carotid bruits.  Abdomen: no suprapubic tenderness, no masses palpated. No hepatosplenomegaly. Bowel sounds positive. Foley catheter in place. Musculoskeletal: no clubbing / cyanosis. No joint deformity upper and lower extremities. Good ROM, no contractures. Normal muscle tone.  Skin: no rashes, lesions, ulcers. No induration Neurologic: CN 2-12 grossly intact. Sensation intact, DTR normal. Strength 5/5 in all 4.  Psychiatric: Normal judgment and insight. Alert and oriented x 3. Normal mood.     Labs on Admission: I have personally reviewed following labs and imaging studies  CBC:  Recent Labs Lab 03/05/16 1739  WBC 8.4  HGB 15.0  HCT 41.9  MCV 85.0  PLT 314   Basic Metabolic Panel:  Recent Labs Lab 03/05/16 1739  NA 124*  K 4.9  CL 95*  CO2 20*  GLUCOSE 804*  BUN 11  CREATININE 1.24  CALCIUM 9.2   GFR: CrCl cannot be calculated (Unknown ideal weight.). Liver Function Tests: No results for input(s): AST, ALT, ALKPHOS, BILITOT, PROT, ALBUMIN in the last 168 hours. No results for input(s): LIPASE, AMYLASE in the last 168 hours. No results for input(s): AMMONIA in the last 168 hours. Coagulation Profile: No results for input(s): INR, PROTIME in the last 168 hours. Cardiac Enzymes: No results for input(s): CKTOTAL, CKMB, CKMBINDEX, TROPONINI in the last 168 hours. BNP (last  3 results) No results for input(s): PROBNP in the last 8760 hours. HbA1C: No results for input(s): HGBA1C in the last 72 hours. CBG:  Recent Labs Lab 03/05/16 1921  GLUCAP >600*   Lipid Profile: No results for input(s): CHOL, HDL, LDLCALC, TRIG, CHOLHDL, LDLDIRECT in the last 72 hours. Thyroid Function Tests: No results for input(s): TSH, T4TOTAL, FREET4, T3FREE, THYROIDAB in the last 72 hours. Anemia Panel: No results for input(s): VITAMINB12, FOLATE, FERRITIN, TIBC, IRON, RETICCTPCT in the last 72 hours. Urine analysis:    Component Value Date/Time   COLORURINE COLORLESS (A) 03/05/2016 1745   APPEARANCEUR CLEAR 03/05/2016 1745   LABSPEC 1.026 03/05/2016 1745   PHURINE 6.0 03/05/2016 1745   GLUCOSEU >=500 (A) 03/05/2016 1745   HGBUR NEGATIVE 03/05/2016 1745   BILIRUBINUR NEGATIVE 03/05/2016 1745   BILIRUBINUR negative 07/11/2015 1149   BILIRUBINUR neg 06/14/2014 1124   KETONESUR 5 (A) 03/05/2016 1745   PROTEINUR NEGATIVE 03/05/2016 1745   UROBILINOGEN 0.2 07/11/2015 1149   UROBILINOGEN 0.2 10/23/2013 0838   NITRITE NEGATIVE 03/05/2016  [place] 03/05/2016 1745   Sepsis Labs: No results found for this or any previous visit (from the past 240 hour(s)).   Radiological Exams on Admission: No results found.   Assessment/Plan Diabetes Type II, Hyperglycemia: Patient presents with elevated glucose of 802 without anion gap. Patient placed on glucose stabilizer protocol. Patient's last hemoglobin A1c was 7.3 on 10/17/2015. - Admit to Stepdown - Continue Glucose stabilizer protocol  - Held Amaryl, saxagliptin, and metformin - Monitor electrolytes and replace as needed - Switch over to subcutaneous insulin - Check hemoglobin A1c in a.m. - Diabetic education in a.m.  Acute kidney injury: Patient's baseline creatinine was previously 0.78, but on admission noted to be 1.24 with a BUN of 11. Suspect secondary to urinary retention. - recheck Bmp in  am  Urinary retention: Acute. Exact cause of urinary retention unknown at this time. Question BPH. - Check renal ultrasound - continue foley catheter, began bladder training when able - continue terazosin    Essential hypertension - continue lisinopril   Dyslipidemia: Patient was noted to have stopped his atrovastatin and fenofibrate. - Consider need to restart these medications  DVT prophylaxis: Lovenox Code Status: Full  Family Communication: No family present at bedside Disposition Plan: likely discharge home once medically stable Consults called: none Admission status: Observation  Norval Morton MD [facility] Pager [phone]  If 7PM-7AM, please contact night-coverage [email or web address] Password [email or web address]  03/05/2016, 8:11 PM

## 2016-03-05 NOTE — ED Notes (Signed)
Pt reports increase in thirst over the last few days. Pt with hx of DM. Pt states he was out of his diabetic medications for approx 1 week. Now has his medications.

## 2016-03-05 NOTE — ED Notes (Signed)
Patient transported to X-ray 

## 2016-03-05 NOTE — ED Triage Notes (Signed)
Pt states he has had increased thirst as well and drinking water all day long

## 2016-03-05 NOTE — ED Triage Notes (Signed)
To ED for further eval of difficulty urinating. States he goes but the feels like he's retaining urine. No abd pain. Seen by PMD a week ago and given meds for prostate... No relief. Denies fever.

## 2016-03-05 NOTE — ED Notes (Signed)
Yao MD scan patient's bladder for acute urinary retention.  Patient's bladder is still distended after voiding twice. Foley will be placed per MD order.

## 2016-03-05 NOTE — ED Provider Notes (Signed)
[facility] DEPT Provider Note   CSN: [id number] Arrival date & time: 03/05/16  1651     History   Chief Complaint Chief Complaint  Patient presents with  . Urinary Retention    HPI  Joseph Roberts is a 56 y.o. male hx of DM, here with urinary retention, hyperglycemia. Patient states that for the last 3 weeks, patient has been very thirsty. He's been drinking water all day long. He also has been urinating very frequently. For the last week or so, patient noticed that he has some trouble urinating. He will urinate and he felt like his bladder is not empty. Patient saw his primary care doctor and was prescribed something for his prostate and he does not remember what it is. Patient states that over the last week or so, patient has really had severe suprapubic pain. Denies any vomiting or fevers.  The history is provided by the patient.    Past Medical History:  Diagnosis Date  . Diabetes mellitus 2011  . High triglycerides     Patient Active Problem List   Diagnosis Date Noted  . 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 Dyslipidemia with low high density lipoprotein (HDL) cholesterol with hypertriglyceridemia due to type 2 diabetes mellitus ([place]) 11/03/2013  . Gall bladder polyp 10/24/2013  . GERD (gastroesophageal reflux disease) 10/24/2013  . Diabetes type 2, controlled ([place]) 10/23/2013    Past Surgical History:  Procedure Laterality Date  . COLONOSCOPY N/A 10/24/2013   Procedure: COLONOSCOPY;  Surgeon: Ladene Artist, MD;  Location: [facility] ENDOSCOPY;  Service: Endoscopy;  Laterality: N/A;. diverticulosis, mild, small internal hemorrhoids       Home Medications    Prior to Admission medications   Medication Sig Start Date End Date Taking? Authorizing Provider  atorvastatin (LIPITOR) 40 MG tablet Take 1 tablet (40 mg total) by mouth daily. 10/17/15   Mancel Bale, PA-C  Blood Glucose Monitoring Suppl (ONE TOUCH ULTRA MINI) W/DEVICE KIT 1 each by Does not apply route daily. Test  fasting daily    Historical Provider, MD  cyclobenzaprine (FLEXERIL) 10 MG tablet Take 1 tablet (10 mg total) by mouth 3 (three) times daily as needed for muscle spasms. 06/14/14   Roselee Culver, MD  dicyclomine (BENTYL) 10 MG capsule Take 1 pill 3 times daily if needed for abdominal pain 07/11/15   Posey Boyer, MD  fenofibrate 160 MG tablet Take 1 tablet (160 mg total) by mouth daily. 10/18/15   Mancel Bale, PA-C  glimepiride (AMARYL) 2 MG tablet Take 1 tablet (2 mg total) by mouth daily before breakfast. 10/17/15   Mancel Bale, PA-C  glucose blood test strip 1 each by Other route as needed for other. Test Strips One Touch Ultra Blue Test fasting daily    Historical Provider, MD  Guaifenesin (MUCINEX MAXIMUM STRENGTH) 1200 MG TB12 Take 1 tablet (1,200 mg total) by mouth every 12 (twelve) hours as needed. 04/08/15   Dorian Heckle English, PA  lisinopril (PRINIVIL,ZESTRIL) 5 MG tablet Take 1 tablet (5 mg total) by mouth daily. 10/17/15   Mancel Bale, PA-C  metFORMIN (GLUCOPHAGE) 1000 MG tablet Take 1 tablet (1,000 mg total) by mouth 2 (two) times daily with a meal. 10/17/15   Mancel Bale, PA-C  pantoprazole (PROTONIX) 40 MG tablet Take 1 tablet (40 mg total) by mouth daily. 10/17/15   Mancel Bale, PA-C  saxagliptin HCl (ONGLYZA) 2.5 MG TABS tablet Take 1 tablet (2.5 mg total) by mouth daily. 10/17/15  Mancel Bale, PA-C  triamcinolone cream (KENALOG) 0.5 % Apply 1 application topically 2 (two) times daily. 10/17/15   Mancel Bale, PA-C    Family History Family History  Problem Relation Age of Onset  . Diabetes Mother   . Diabetes Sister   . Diabetes Brother     Social History Social History  Substance Use Topics  . Smoking status: Current Every Day Smoker    Packs/day: 0.50    Years: 30.00    Types: Cigarettes  . Smokeless tobacco: Never Used  . Alcohol use No     Allergies   Patient has no known allergies.   Review of Systems Review of Systems  Genitourinary: Positive  for difficulty urinating and frequency.  All other systems reviewed and are negative.    Physical Exam Updated Vital Signs BP 137/93 (BP Location: Right Arm)   Pulse 72   Temp 98.7 F (37.1 C) (Oral)   SpO2 99%   Physical Exam  Constitutional: He is oriented to person, place, and time.  Chronically ill, dehydrated   HENT:  Head: Normocephalic.  MM slightly dry   Eyes: EOM are normal. Pupils are equal, round, and reactive to light.  Neck: Normal range of motion. Neck supple.  Cardiovascular: Normal rate, regular rhythm and normal heart sounds.   Pulmonary/Chest: Effort normal and breath sounds normal. No respiratory distress. He has no wheezes. He has no rales.  Abdominal: Soft. Bowel sounds are normal.  + suprapubic tenderness, bladder appears distended   Musculoskeletal: Normal range of motion.  Neurological: He is alert and oriented to person, place, and time. He displays normal reflexes. No cranial nerve deficit. Coordination normal.  Skin: Skin is warm.  Psychiatric: He has a normal mood and affect.  Nursing note and vitals reviewed.    ED Treatments / Results  Labs (all labs ordered are listed, but only abnormal results are displayed) Labs Reviewed  URINALYSIS, ROUTINE W REFLEX MICROSCOPIC - Abnormal; Notable for the following:       Result Value   Color, Urine COLORLESS (*)    Glucose, UA >=500 (*)    Ketones, ur 5 (*)    All other components within normal limits  BASIC METABOLIC PANEL - Abnormal; Notable for the following:    Sodium 124 (*)    Chloride 95 (*)    CO2 20 (*)    Glucose, Bld 804 (*)    All other components within normal limits  CBG MONITORING, ED - Abnormal; Notable for the following:    Glucose-Capillary >600 (*)    All other components within normal limits  CBC    EKG  EKG Interpretation None       Radiology No results found.  Procedures Procedures (including critical care time)  CRITICAL CARE Performed by: Wandra Arthurs   Total critical care time: 30  minutes  Critical care time was exclusive of separately billable procedures and treating other patients.  Critical care was necessary to treat or prevent imminent or life-threatening deterioration.  Critical care was time spent personally by me on the following activities: development of treatment plan with patient and/or surrogate as well as nursing, discussions with consultants, evaluation of patient's response to treatment, examination of patient, obtaining history from patient or surrogate, ordering and performing treatments and interventions, ordering and review of laboratory studies, ordering and review of radiographic studies, pulse oximetry and re-evaluation of patient's condition.   Medications Ordered in ED Medications  sodium chloride 0.9 %  bolus 2,000 mL (not administered)  dextrose 5 %-0.45 % sodium chloride infusion (not administered)  insulin regular bolus via infusion 0-10 Units (not administered)  insulin regular (NOVOLIN R,HUMULIN R) 250 Units in sodium chloride 0.9 % 250 mL (1 Units/mL) infusion (5.4 Units/hr Intravenous New Bag/Given 03/05/16 1954)  dextrose 50 % solution 25 mL (not administered)  0.9 %  sodium chloride infusion (not administered)     Initial Impression / Assessment and Plan / ED Course  I have reviewed the triage vital signs and the nursing notes.  Pertinent labs & imaging results that were available during my care of the patient were reviewed by me and considered in my medical decision making (see chart for details).  Clinical Course    Joseph Roberts is a 56 y.o. male here with polyuria, polydipsia and now has urinary retention. I performed bedside US and he appears to be in retention. Foley placed by nursing and about 700 cc came out. CBG read HI. Will check labs, give IVF, insulin. Patient has known type 2 DM.   8:07 PM Glucose 804, AG nl. UA showed no obvious UTI. Given 2 L NS bolus. Started on  glucostabilizer to control blood sugar. Will admit for hyperglycemia from DM, urinary retention.    Final Clinical Impressions(s) / ED Diagnoses   Final diagnoses:  None    New Prescriptions New Prescriptions   No medications on file     Drenda Freeze, MD 03/05/16 2013

## 2016-03-06 DIAGNOSIS — E118 Type 2 diabetes mellitus with unspecified complications: Secondary | ICD-10-CM | POA: Diagnosis not present

## 2016-03-06 DIAGNOSIS — E785 Hyperlipidemia, unspecified: Secondary | ICD-10-CM

## 2016-03-06 DIAGNOSIS — N179 Acute kidney failure, unspecified: Secondary | ICD-10-CM | POA: Diagnosis present

## 2016-03-06 DIAGNOSIS — R339 Retention of urine, unspecified: Secondary | ICD-10-CM | POA: Diagnosis not present

## 2016-03-06 DIAGNOSIS — E1165 Type 2 diabetes mellitus with hyperglycemia: Secondary | ICD-10-CM

## 2016-03-06 DIAGNOSIS — I10 Essential (primary) hypertension: Secondary | ICD-10-CM | POA: Diagnosis present

## 2016-03-06 DIAGNOSIS — E1101 Type 2 diabetes mellitus with hyperosmolarity with coma: Secondary | ICD-10-CM | POA: Diagnosis not present

## 2016-03-06 DIAGNOSIS — IMO0002 Reserved for concepts with insufficient information to code with codable children: Secondary | ICD-10-CM

## 2016-03-06 LAB — BASIC METABOLIC PANEL
ANION GAP: 5 (ref 5–15)
BUN: 8 mg/dL (ref 6–20)
CALCIUM: 8.4 mg/dL — AB (ref 8.9–10.3)
CO2: 24 mmol/L (ref 22–32)
Chloride: 109 mmol/L (ref 101–111)
Creatinine, Ser: 0.77 mg/dL (ref 0.61–1.24)
GFR calc Af Amer: >60 mL/min (ref >60)
GFR calc non Af Amer: >60 mL/min (ref >60)
GLUCOSE: 78 mg/dL (ref 65–99)
Potassium: 3.6 mmol/L (ref 3.5–5.1)
Sodium: 138 mmol/L (ref 135–145)

## 2016-03-06 LAB — CBC
HEMATOCRIT: 39.6 % (ref 39.0–52.0)
HEMOGLOBIN: 13.7 g/dL (ref 13.0–17.0)
MCH: 29.3 pg (ref 26.0–34.0)
MCHC: 34.6 g/dL (ref 30.0–36.0)
MCV: 84.8 fL (ref 78.0–100.0)
Platelets: 202 10*3/uL (ref 150–400)
RBC: 4.67 MIL/uL (ref 4.22–5.81)
RDW: 12.5 % (ref 11.5–15.5)
WBC: 9.3 10*3/uL (ref 4.0–10.5)

## 2016-03-06 LAB — GLUCOSE, CAPILLARY
GLUCOSE-CAPILLARY: 91 mg/dL (ref 65–99)
GLUCOSE-CAPILLARY: 184 mg/dL — AB (ref 65–99)
GLUCOSE-CAPILLARY: 229 mg/dL — AB (ref 65–99)
Glucose-Capillary: 117 mg/dL — ABNORMAL HIGH (ref 65–99)
Glucose-Capillary: 146 mg/dL — ABNORMAL HIGH (ref 65–99)
Glucose-Capillary: 158 mg/dL — ABNORMAL HIGH (ref 65–99)
Glucose-Capillary: 203 mg/dL — ABNORMAL HIGH (ref 65–99)
Glucose-Capillary: 251 mg/dL — ABNORMAL HIGH (ref 65–99)
Glucose-Capillary: 292 mg/dL — ABNORMAL HIGH (ref 65–99)

## 2016-03-06 LAB — MRSA PCR SCREENING: MRSA by PCR: NEGATIVE

## 2016-03-06 MED ORDER — INSULIN ASPART 100 UNIT/ML ~~LOC~~ SOLN
0.0000 [IU] | Freq: Three times a day (TID) | SUBCUTANEOUS | Status: DC
  Administered 2016-03-06: 3 [IU] via SUBCUTANEOUS
  Administered 2016-03-06: 5 [IU] via SUBCUTANEOUS
  Administered 2016-03-06 – 2016-03-07 (×2): 2 [IU] via SUBCUTANEOUS
  Administered 2016-03-07: 5 [IU] via SUBCUTANEOUS
  Administered 2016-03-07: 7 [IU] via SUBCUTANEOUS
  Administered 2016-03-08: 3 [IU] via SUBCUTANEOUS
  Administered 2016-03-08: 2 [IU] via SUBCUTANEOUS

## 2016-03-06 MED ORDER — FENOFIBRATE 160 MG PO TABS
160.0000 mg | ORAL_TABLET | Freq: Every day | ORAL | Status: DC
  Administered 2016-03-06 – 2016-03-08 (×3): 160 mg via ORAL
  Filled 2016-03-06 (×3): qty 1

## 2016-03-06 MED ORDER — INSULIN DETEMIR 100 UNIT/ML ~~LOC~~ SOLN
5.0000 [IU] | Freq: Two times a day (BID) | SUBCUTANEOUS | Status: DC
  Administered 2016-03-06 (×3): 5 [IU] via SUBCUTANEOUS
  Filled 2016-03-06 (×4): qty 0.05

## 2016-03-06 MED ORDER — LISINOPRIL 2.5 MG PO TABS
2.5000 mg | ORAL_TABLET | Freq: Every day | ORAL | Status: DC
  Administered 2016-03-07 – 2016-03-08 (×2): 2.5 mg via ORAL
  Filled 2016-03-06 (×2): qty 1

## 2016-03-06 MED ORDER — LIVING WELL WITH DIABETES BOOK - IN SPANISH
Freq: Once | Status: AC
  Administered 2016-03-06: 15:00:00
  Filled 2016-03-06: qty 1

## 2016-03-06 MED ORDER — SODIUM CHLORIDE 0.9 % IV SOLN
INTRAVENOUS | Status: DC
  Administered 2016-03-06: 04:00:00 via INTRAVENOUS

## 2016-03-06 MED ORDER — INFLUENZA VAC SPLIT QUAD 0.5 ML IM SUSY
0.5000 mL | PREFILLED_SYRINGE | INTRAMUSCULAR | Status: AC
  Administered 2016-03-07: 0.5 mL via INTRAMUSCULAR
  Filled 2016-03-06: qty 0.5

## 2016-03-06 MED ORDER — TERAZOSIN HCL 1 MG PO CAPS
1.0000 mg | ORAL_CAPSULE | Freq: Once | ORAL | Status: AC
  Administered 2016-03-06: 1 mg via ORAL
  Filled 2016-03-06: qty 1

## 2016-03-06 MED ORDER — TERAZOSIN HCL 2 MG PO CAPS
2.0000 mg | ORAL_CAPSULE | Freq: Every day | ORAL | Status: DC
  Administered 2016-03-07 – 2016-03-08 (×2): 2 mg via ORAL
  Filled 2016-03-06 (×2): qty 1

## 2016-03-06 MED ORDER — INSULIN ASPART 100 UNIT/ML ~~LOC~~ SOLN
3.0000 [IU] | Freq: Three times a day (TID) | SUBCUTANEOUS | Status: DC
  Administered 2016-03-06 – 2016-03-08 (×6): 3 [IU] via SUBCUTANEOUS

## 2016-03-06 MED ORDER — ATORVASTATIN CALCIUM 40 MG PO TABS
40.0000 mg | ORAL_TABLET | Freq: Every day | ORAL | Status: DC
  Administered 2016-03-06 – 2016-03-07 (×2): 40 mg via ORAL
  Filled 2016-03-06 (×2): qty 1

## 2016-03-06 NOTE — Progress Notes (Addendum)
Inpatient Diabetes Program Recommendations  AACE/ADA: New Consensus Statement on Inpatient Glycemic Control (2015)  Target Ranges:  Prepandial:   less than 140 mg/dL      Peak postprandial:   less than 180 mg/dL (1-2 hours)      Critically ill patients:  140 - 180 mg/dL   Lab Results  Component Value Date   GLUCAP 184 (H) 03/06/2016   HGBA1C 7.3 10/17/2015   Results for Joseph Roberts, Joseph Roberts (MRN [id number]) as of 03/06/2016 10:12  Ref. Range 03/05/2016 19:21 03/05/2016 20:47 03/05/2016 21:57 03/05/2016 22:36 03/05/2016 23:56 03/06/2016 00:57 03/06/2016 02:05 03/06/2016 03:19 03/06/2016 04:18 03/06/2016 05:49 03/06/2016 07:50  Glucose-Capillary Latest Ref Range: 65 - 99 mg/dL >409>600 (HH) 811467 (H) 914355 (H) 270 (H) 222 (H) 203 (H) 146 (H) 91 117 (H) 158 (H) 184 (H)   Review of Glycemic Control  Diabetes history:     DM2, BUN and Creatinine WNL Outpatient Diabetes medications:      Onglyza 2.5 mg daily; Ran out of Amaryl 2-3 months ago Current orders for Inpatient glycemic control:     Detemir 5 untis BID, Novolog 0[facility] units [facility]  Inpatient Diabetes Program Recommendations:    If patient eating > 50% of meal, please consider Novolog 3 units meal coverage TIDAC.    Will follow trends.    Per ADA recommendations "consider performing an A1C on all patients with diabetes or hyperglycemia admitted to the hospital if not performed in the prior 3 months".  Attempted to see patient today, however patient resting.  Thank you,  Kristine LineaKaren Laree Garron, RN, MSN Diabetes Coordinator Inpatient Diabetes Program [phone] (Team Pager)

## 2016-03-06 NOTE — Progress Notes (Signed)
Patient arrived to 5M04 from 3E. Patient's brother at bedside. Patient alert and oriented x 4. Foley catheter in place for retention.  Patient in resting comfortable. Call bell within reach.

## 2016-03-06 NOTE — Progress Notes (Signed)
PROGRESS NOTE    Joseph Roberts  ZOX:[id number] DOB: 1960-10-27 DOA: 03/05/2016 PCP: Lora Paula, MD   Brief Narrative:  Joseph Roberts is a 56 y.o. HM (Spanish speaker) PMHx  DM Type 2 uncontrolled with complications, HLD   Who presents with complaints of 3 weeks of inability to urinate. Spanish interpreter services utilized to obtain history. He complains of urinary frequency, increased thirst, and suprapubic pain pain with urination. He feels as though his bladder has not completely emptying out. He denies ever having symptoms like this previously in the past. Previously, seen by his primary care doctor who started him on terazosin, but the patient denies any significant change in symptoms.He reports trying to drink more water without relief of symptoms.  He denies any fever, chills, cough, shortness of breath, nausea, vomiting, or diarrhea.  ED Course: Upon admission into the emergency department patient was found to be afebrile with vital signs relatively within normal limits. Lab work revealed glucose 804 with normal anion gap. Urinalysis was negative for ketones and positive for glucose. He was started on the glucose stabilizes and a Foley catheter was placed after patient was seen to have significant urinary retention.   Subjective: 1/2 A/O 4, NAD. Patient's only concern is whether he will be able to urinate when Foley removed.     Assessment & Plan:   Active Problems:   Type 2 diabetes mellitus with hyperglycemia (HCC)   Essential hypertension   Acute kidney injury (HCC)   Urinary retention   Diabetes Type II, uncontrolled with complications/HHNS:  -Patient presents with elevated glucose of 802 without anion gap. Patient placed on glucose stabilizer protocol. - 8/14 Hemoglobin A1c= 7.3 - Held Amaryl, saxagliptin, and metformin -Patient has received Diabetic education -Levemir 5 units BID -Start NovoLog 3 units QAC -Sensitive SSI  Acute kidney injury:(baseline Cr  0.78) Lab Results  Component Value Date   CREATININE 0.77 03/06/2016   CREATININE 0.94 03/05/2016   CREATININE 1.24 03/05/2016  -at baseline,   Urinary retention: Acute.  -Exact cause of urinary retention unknown at this time. Question BPH. - Renal ultrasound: Nondiagnostic -Increase Terazosin 2 mg daily    Essential hypertension - Decrease lisinopril 2.5 mg daily    Dyslipidemia:  -8/14 lipid panel far outside ADA guidelines -Restart Lipitor 40 mg daily -Restart fenofibrate 160 mg daily     DVT prophylaxis: Lovenox Code Status: Full Family Communication: Family present Disposition Plan: Next 24-48 hours   Consultants:  NA  Procedures/Significant Events:  1/1 renal ultrasound: Negative     VENTILATOR SETTINGS: NA    Cultures NA   Antimicrobials: NA    Devices NA    LINES / TUBES:  NA    Continuous Infusions: . sodium chloride 75 mL/hr at 03/06/16 0415  . insulin (NOVOLIN-R) infusion Stopped (03/06/16 0450)     Objective: Vitals:   03/06/16 1200 03/06/16 1400 03/06/16 1554 03/06/16 1600  BP: 91/62 96/64  98/66  Pulse: (!) 57 71  65  Resp: 20 (!) 21  (!) 22  Temp:   98.9 F (37.2 C)   TempSrc:   Oral   SpO2: 98% 96%  97%  Weight:      Height:        Intake/Output Summary (Last 24 hours) at 03/06/16 1708 Last data filed at 03/06/16 1600  Gross per 24 hour  Intake          3871.56 ml  Output  2250 ml  Net          1621.56 ml   Filed Weights   03/05/16 2345  Weight: 71 kg (156 lb 8.4 oz)    Examination:  General: A/O 4, NAD,No acute respiratory distress Eyes: negative scleral hemorrhage, negative anisocoria, negative icterus ENT: Negative Runny nose, negative gingival bleeding, Neck:  Negative scars, masses, torticollis, lymphadenopathy, JVD Lungs: Clear to auscultation bilaterally without wheezes or crackles Cardiovascular: Regular rate and rhythm without murmur gallop or rub normal S1 and S2 Abdomen: negative  abdominal pain, nondistended, positive soft, bowel sounds, no rebound, no ascites, no appreciable mass Extremities: No significant cyanosis, clubbing, or edema bilateral lower extremities Skin: Negative rashes, lesions, ulcers Psychiatric:  Negative depression, negative anxiety, negative fatigue, negative mania  Central nervous system:  Cranial nerves II through XII intact, tongue/uvula midline, all extremities muscle strength 5/5, sensation intact throughout, negative dysarthria, negative expressive aphasia, negative receptive aphasia.  .     Data Reviewed: Care during the described time interval was provided by me .  I have reviewed this patient's available data, including medical history, events of note, physical examination, and all test results as part of my evaluation. I have personally reviewed and interpreted all radiology studies.  CBC:  Recent Labs Lab 03/05/16 1739 03/06/16 0257  WBC 8.4 9.3  HGB 15.0 13.7  HCT 41.9 39.6  MCV 85.0 84.8  PLT 225 202   Basic Metabolic Panel:  Recent Labs Lab 03/05/16 1739 03/05/16 2126 03/06/16 0257  NA 124* 134* 138  K 4.9 3.6 3.6  CL 95* 107 109  CO2 20* 20* 24  GLUCOSE 804* 392* 78  BUN 11 11 8   CREATININE 1.24 0.94 0.77  CALCIUM 9.2 8.4* 8.4*   GFR: Estimated Creatinine Clearance: 102.1 mL/min (by C-G formula based on SCr of 0.77 mg/dL). Liver Function Tests: No results for input(s): AST, ALT, ALKPHOS, BILITOT, PROT, ALBUMIN in the last 168 hours. No results for input(s): LIPASE, AMYLASE in the last 168 hours. No results for input(s): AMMONIA in the last 168 hours. Coagulation Profile: No results for input(s): INR, PROTIME in the last 168 hours. Cardiac Enzymes: No results for input(s): CKTOTAL, CKMB, CKMBINDEX, TROPONINI in the last 168 hours. BNP (last 3 results) No results for input(s): PROBNP in the last 8760 hours. HbA1C: No results for input(s): HGBA1C in the last 72 hours. CBG:  Recent Labs Lab  03/06/16 0418 03/06/16 0549 03/06/16 0750 03/06/16 1147 03/06/16 1551  GLUCAP 117* 158* 184* 229* 292*   Lipid Profile: No results for input(s): CHOL, HDL, LDLCALC, TRIG, CHOLHDL, LDLDIRECT in the last 72 hours. Thyroid Function Tests: No results for input(s): TSH, T4TOTAL, FREET4, T3FREE, THYROIDAB in the last 72 hours. Anemia Panel: No results for input(s): VITAMINB12, FOLATE, FERRITIN, TIBC, IRON, RETICCTPCT in the last 72 hours. Urine analysis:    Component Value Date/Time   COLORURINE COLORLESS (A) 03/05/2016 1745   APPEARANCEUR CLEAR 03/05/2016 1745   LABSPEC 1.026 03/05/2016 1745   PHURINE 6.0 03/05/2016 1745   GLUCOSEU >=500 (A) 03/05/2016 1745   HGBUR NEGATIVE 03/05/2016 1745   BILIRUBINUR NEGATIVE 03/05/2016 1745   BILIRUBINUR negative 07/11/2015 1149   BILIRUBINUR neg 06/14/2014 1124   KETONESUR 5 (A) 03/05/2016 1745   PROTEINUR NEGATIVE 03/05/2016 1745   UROBILINOGEN 0.2 07/11/2015 1149   UROBILINOGEN 0.2 10/23/2013 0838   NITRITE NEGATIVE 03/05/2016 1745   LEUKOCYTESUR NEGATIVE 03/05/2016 1745   Sepsis Labs: @LABRCNTIP (procalcitonin:4,lacticidven:4)  ) Recent Results (from the past 240 hour(s))  MRSA  PCR Screening     Status: None   Collection Time: 03/05/16 11:38 PM  Result Value Ref Range Status   MRSA by PCR NEGATIVE NEGATIVE Final    Comment:        The GeneXpert MRSA Assay (FDA approved for NASAL specimens only), is one component of a comprehensive MRSA colonization surveillance program. It is not intended to diagnose MRSA infection nor to guide or monitor treatment for MRSA infections.          Radiology Studies: [place] Renal  Result Date: 03/05/2016 CLINICAL DATA:  Suprapubic pain for 1 week EXAM: RENAL / URINARY TRACT ULTRASOUND COMPLETE COMPARISON:  CT 10/23/2013 FINDINGS: Right Kidney: Length: 11.4 cm. Echogenicity within normal limits. No mass or hydronephrosis visualized. Left Kidney: Length: 11.6 cm. Echogenicity within normal limits.  No mass or hydronephrosis visualized. Bladder: Foley catheter is present.  The bladder is empty. IMPRESSION: Negative renal ultrasound Electronically Signed   By: Jasmine PangKim  Fujinaga M.D.   On: 03/05/2016 22:18        Scheduled Meds: . enoxaparin (LOVENOX) injection  40 mg Subcutaneous QHS  . Influenza vac split quadrivalent PF  0.5 mL Intramuscular Tomorrow-1000  . insulin aspart  0-9 Units Subcutaneous TID WC  . insulin detemir  5 Units Subcutaneous BID  . insulin regular  0-10 Units Intravenous TID WC  . lisinopril  5 mg Oral Daily  . living well with diabetes book- in spanish   Does not apply Once  . pantoprazole  40 mg Oral Daily  . terazosin  1 mg Oral Daily   Continuous Infusions: . sodium chloride 75 mL/hr at 03/06/16 0415  . insulin (NOVOLIN-R) infusion Stopped (03/06/16 0450)     LOS: 0 days    Time spent: 40 minutes    WOODS, Roselind MessierURTIS J, MD [facility] Pager [phone]   If 7PM-7AM, please contact night-coverage [email or web address] Password [email or web address] 03/06/2016, 5:08 PM

## 2016-03-06 NOTE — Progress Notes (Signed)
Report given via telephone to Morrie SheldonAshley, RN on [facility]. Patient to be transported via wheelchair to unit by [employer]. VSS, patient alert and oriented. Brother at bedside at this time.

## 2016-03-07 DIAGNOSIS — N179 Acute kidney failure, unspecified: Secondary | ICD-10-CM | POA: Diagnosis not present

## 2016-03-07 DIAGNOSIS — E785 Hyperlipidemia, unspecified: Secondary | ICD-10-CM | POA: Diagnosis not present

## 2016-03-07 DIAGNOSIS — I10 Essential (primary) hypertension: Secondary | ICD-10-CM | POA: Diagnosis not present

## 2016-03-07 DIAGNOSIS — E1101 Type 2 diabetes mellitus with hyperosmolarity with coma: Secondary | ICD-10-CM | POA: Diagnosis not present

## 2016-03-07 LAB — COMPREHENSIVE METABOLIC PANEL
ALT: 32 U/L (ref 17–63)
AST: 32 U/L (ref 15–41)
Albumin: 2.8 g/dL — ABNORMAL LOW (ref 3.5–5.0)
Alkaline Phosphatase: 87 U/L (ref 38–126)
Anion gap: 3 — ABNORMAL LOW (ref 5–15)
BUN: 8 mg/dL (ref 6–20)
CHLORIDE: 108 mmol/L (ref 101–111)
CO2: 22 mmol/L (ref 22–32)
CREATININE: 0.69 mg/dL (ref 0.61–1.24)
Calcium: 8.5 mg/dL — ABNORMAL LOW (ref 8.9–10.3)
GFR calc Af Amer: >60 mL/min (ref >60)
GFR calc non Af Amer: >60 mL/min (ref >60)
Glucose, Bld: 381 mg/dL — ABNORMAL HIGH (ref 65–99)
Potassium: 4 mmol/L (ref 3.5–5.1)
SODIUM: 133 mmol/L — AB (ref 135–145)
Total Bilirubin: 0.5 mg/dL (ref 0.3–1.2)
Total Protein: 5.6 g/dL — ABNORMAL LOW (ref 6.5–8.1)

## 2016-03-07 LAB — GLUCOSE, CAPILLARY
GLUCOSE-CAPILLARY: 323 mg/dL — AB (ref 65–99)
GLUCOSE-CAPILLARY: 179 mg/dL — AB (ref 65–99)
Glucose-Capillary: 278 mg/dL — ABNORMAL HIGH (ref 65–99)
Glucose-Capillary: 217 mg/dL — ABNORMAL HIGH (ref 65–99)

## 2016-03-07 LAB — CBC
HCT: 42.2 % (ref 39.0–52.0)
Hemoglobin: 14.2 g/dL (ref 13.0–17.0)
MCH: 29.6 pg (ref 26.0–34.0)
MCHC: 33.6 g/dL (ref 30.0–36.0)
MCV: 88.1 fL (ref 78.0–100.0)
PLATELETS: 173 10*3/uL (ref 150–400)
RBC: 4.79 MIL/uL (ref 4.22–5.81)
RDW: 13.1 % (ref 11.5–15.5)
WBC: 7.3 10*3/uL (ref 4.0–10.5)

## 2016-03-07 LAB — LIPID PANEL
Cholesterol: 135 mg/dL (ref 0–200)
HDL: 29 mg/dL — AB (ref >40)
LDL CALC: 45 mg/dL (ref 0–99)
Total CHOL/HDL Ratio: 4.7 RATIO
Triglycerides: 303 mg/dL — ABNORMAL HIGH (ref <150)
VLDL: 61 mg/dL — ABNORMAL HIGH (ref 0–40)

## 2016-03-07 LAB — HEMOGLOBIN A1C
Hgb A1c MFr Bld: 13.4 % — ABNORMAL HIGH (ref 4.8–5.6)
Mean Plasma Glucose: 338 mg/dL

## 2016-03-07 LAB — MAGNESIUM: Magnesium: 1.8 mg/dL (ref 1.7–2.4)

## 2016-03-07 MED ORDER — INSULIN DETEMIR 100 UNIT/ML ~~LOC~~ SOLN
15.0000 [IU] | Freq: Two times a day (BID) | SUBCUTANEOUS | Status: DC
  Administered 2016-03-07 – 2016-03-08 (×3): 15 [IU] via SUBCUTANEOUS
  Filled 2016-03-07 (×3): qty 0.15

## 2016-03-07 NOTE — Progress Notes (Signed)
Visited pt, providing spiritual/emotional support and prayer. Pt was sleepy as left. Chaplain available for f/u.     03/07/16 1500  Clinical Encounter Type  Visited With Patient  Visit Type Initial;Psychological support;Spiritual support;Social support  Referral From Chaplain  Spiritual Encounters  Spiritual Needs Prayer;Emotional  Stress Factors  Patient Stress Factors Health changes   Ephraim Hamburgerynthia A Jeiry Birnbaum, [address]

## 2016-03-07 NOTE — Progress Notes (Addendum)
Inpatient Diabetes Program Recommendations  AACE/ADA: New Consensus Statement on Inpatient Glycemic Control (2015)  Target Ranges:  Prepandial:   less than 140 mg/dL      Peak postprandial:   less than 180 mg/dL (1-2 hours)      Critically ill patients:  140 - 180 mg/dL   Results for Faustino CongressSAMPERIO, Alekai (MRN [id number]) as of 03/07/2016 11:02  Ref. Range 10/17/2015 10:55 03/06/2016 02:57  Hemoglobin A1C Latest Ref Range: 4.8 - 5.6 % 7.3 13.4 (H)     Results for Faustino CongressSAMPERIO, Ethridge (MRN [id number]) as of 03/07/2016 11:02  Ref. Range 03/06/2016 07:50 03/06/2016 11:47 03/06/2016 15:51 03/06/2016 20:15 03/07/2016 05:55  Glucose-Capillary Latest Ref Range: 65 - 99 mg/dL 213184 (H) 086229 (H) 578292 (H) 251 (H) 278 (H)   Review of Glycemic Control  Diabetes history:     DM2, BUN and Creatinine WNL Outpatient Diabetes medications:      Onglyza 2.5 mg daily; Ran out of Amaryl 2-3 months ago Current orders for Inpatient glycemic control:     Detemir 15 untis BID (increased this am),     Novolog 0-9 units TIDAC    Novolog 3 units TIDAC meal coverage  Inpatient Diabetes Program Recommendations:    Please consider increasing meal coverage to 5 units TIDAC if CBG's continue to trend high and patient eats > 50% of meal.    Will continue to monitor CBG trends.  CBG's have been averaging in the 300's (A1C=13.4%) over last 2-3 months.  Note: Spoke with patient regarding self-management of diabetes       Taught patient the following (teach back and/or return demonstration):  Hypo/Hyperglycemia  Medications (what these are, why taking)  CBG monitoring, A1C and why these numbers are important  Carb modified diet  Thank you,  Kristine LineaKaren Claris Pech, RN, MSN Diabetes Coordinator Inpatient Diabetes Program [phone] (Team Pager)

## 2016-03-07 NOTE — Plan of Care (Signed)
Problem: Health Behavior: Goal: Ability to manage health-related needs will improve Outcome: Completed/Met Date Met: 03/07/16 Patient states it is hard to f/u w/ PCP because of work schedule.  Bedside RN told this and will relay to CM.

## 2016-03-07 NOTE — Progress Notes (Signed)
  RD consulted for nutrition education regarding diabetes.   Lab Results  Component Value Date   HGBA1C 13.4 (H) 03/06/2016   RD met with pt using pacific interpreters.   RD provided "Type II Diabetes Education" handout from the [employer] in [place]. Discussed different food groups and their effects on blood sugar, emphasizing carbohydrate-containing foods. Provided list of carbohydrates and recommended serving sizes of common foods.  Discussed importance of controlled and consistent carbohydrate intake throughout the day. Provided examples of ways to balance meals/snacks and encouraged intake of high-fiber, whole grain complex carbohydrates. Teach back method used.  Expect good compliance.  Body mass index is 27.86 kg/m. Pt meets criteria for overweight based on current BMI.  Current diet order is carbohydrate controlled, patient is consuming approximately 100% of meals at this time. Labs and medications reviewed. No further nutrition interventions warranted at this time. RD contact information provided. If additional nutrition issues arise, please re-consult RD.  Koleen Distance, RD, LDN Pager #- [phone]

## 2016-03-07 NOTE — Progress Notes (Signed)
PROGRESS NOTE    Joseph Roberts  ZOX:[id number] DOB: 1961/02/05 DOA: 03/05/2016 PCP: Lora Paula, MD   Brief Narrative:  Joseph Roberts is a 56 y.o. HM (Spanish speaker) PMHx  DM Type 2 uncontrolled with complications, HLD   Who presents with complaints of 3 weeks of . urinary urgency. Marland Kitchen He complains of urinary frequency, increased thirst, and suprapubic pain pain with urination. He feels as though his bladder has not completely emptying out.  Lab work revealed glucose 804 with normal anion gap. Urinalysis was negative for ketones and positive for glucose. He was started on the glucose stabilizer  and a Foley catheter was placed after patient was seen to have significant urinary retention.   Subjective:   Patient awake alert oriented, complaining of urinary frequency and incomplete emptying. Still has a Foley catheter in place.    Assessment & Plan:   Active Problems:   Type 2 diabetes mellitus with hyperglycemia (HCC)   Essential hypertension   Acute kidney injury (HCC)   Urinary retention   Uncontrolled type 2 diabetes mellitus with complication (HCC)   HHNC (hyperglycemic hyperosmolar nonketotic coma) (HCC)   Dyslipidemia   Diabetes Type II, uncontrolled with complications/HHNS:  -Patient presents with elevated glucose of 802 without anion gap. Patient placed on glucose stabilizer protocol. - 8/14 Hemoglobin A1c= 7.3. Hemoglobin A1c now 13.4  - Held Amaryl, saxagliptin, and metformin -Patient has received Diabetic education - increase Levemir 15 units BID -Start NovoLog 3 units QAC -Sensitive SSI  Acute kidney injury:(baseline Cr 0.78) Lab Results  Component Value Date   CREATININE 0.77 03/06/2016   CREATININE 0.94 03/05/2016   CREATININE 1.24 03/05/2016  -at baseline,   Urinary retention: Acute.  -Exact cause of urinary retention unknown at this time. Question BPH. - Renal ultrasound: Nondiagnostic -Increase Terazosin 2 mg daily Check  PVR    Essential  hypertension - Decrease lisinopril 2.5 mg daily    Dyslipidemia:   Repeat lipid panel Continue Lipitor and fenofibrate 160 mg daily     DVT prophylaxis: Lovenox Code Status: Full Family Communication: Family present Disposition Plan: Next 24-48 hours   Consultants:  NA  Procedures/Significant Events:  1/1 renal ultrasound: Negative     VENTILATOR SETTINGS: NA    Cultures NA   Antimicrobials: NA    Devices NA    LINES / TUBES:  NA    Continuous Infusions: . sodium chloride 75 mL/hr at 03/06/16 0415  . insulin (NOVOLIN-R) infusion Stopped (03/06/16 0450)     Objective: Vitals:   03/06/16 2019 03/06/16 2130 03/07/16 0116 03/07/16 0549  BP: 104/67 106/67 (!) 99/59 103/60  Pulse: 77 70 (!) 56 (!) 55  Resp: (!) 21 20 20 20   Temp: 98.4 F (36.9 C) 98.3 F (36.8 C) 98 F (36.7 C) 98 F (36.7 C)  TempSrc: Oral Oral Oral Oral  SpO2: 98% 97% 97% 96%  Weight:  75.9 kg (167 lb 6.4 oz)    Height:  5\' 5"  (1.651 m)      Intake/Output Summary (Last 24 hours) at 03/07/16 0910 Last data filed at 03/07/16 0618  Gross per 24 hour  Intake          1110.31 ml  Output             3250 ml  Net         -2139.69 ml   Filed Weights   03/05/16 2345 03/06/16 2130  Weight: 71 kg (156 lb 8.4 oz) 75.9 kg (167 lb 6.4  oz)    Examination:  General: A/O 4, NAD,No acute respiratory distress Eyes: negative scleral hemorrhage, negative anisocoria, negative icterus ENT: Negative Runny nose, negative gingival bleeding, Neck:  Negative scars, masses, torticollis, lymphadenopathy, JVD Lungs: Clear to auscultation bilaterally without wheezes or crackles Cardiovascular: Regular rate and rhythm without murmur gallop or rub normal S1 and S2 Abdomen: negative abdominal pain, nondistended, positive soft, bowel sounds, no rebound, no ascites, no appreciable mass Extremities: No significant cyanosis, clubbing, or edema bilateral lower extremities Skin: Negative rashes, lesions,  ulcers Psychiatric:  Negative depression, negative anxiety, negative fatigue, negative mania  Central nervous system:  Cranial nerves II through XII intact, tongue/uvula midline, all extremities muscle strength 5/5, sensation intact throughout, negative dysarthria, negative expressive aphasia, negative receptive aphasia.  .     Data Reviewed: Care during the described time interval was provided by me .  I have reviewed this patient's available data, including medical history, events of note, physical examination, and all test results as part of my evaluation. I have personally reviewed and interpreted all radiology studies.  CBC:  Recent Labs Lab 03/05/16 1739 03/06/16 0257  WBC 8.4 9.3  HGB 15.0 13.7  HCT 41.9 39.6  MCV 85.0 84.8  PLT 225 202   Basic Metabolic Panel:  Recent Labs Lab 03/05/16 1739 03/05/16 2126 03/06/16 0257  NA 124* 134* 138  K 4.9 3.6 3.6  CL 95* 107 109  CO2 20* 20* 24  GLUCOSE 804* 392* 78  BUN 11 11 8   CREATININE 1.24 0.94 0.77  CALCIUM 9.2 8.4* 8.4*   GFR: Estimated Creatinine Clearance: 100.5 mL/min (by C-G formula based on SCr of 0.77 mg/dL). Liver Function Tests: No results for input(s): AST, ALT, ALKPHOS, BILITOT, PROT, ALBUMIN in the last 168 hours. No results for input(s): LIPASE, AMYLASE in the last 168 hours. No results for input(s): AMMONIA in the last 168 hours. Coagulation Profile: No results for input(s): INR, PROTIME in the last 168 hours. Cardiac Enzymes: No results for input(s): CKTOTAL, CKMB, CKMBINDEX, TROPONINI in the last 168 hours. BNP (last 3 results) No results for input(s): PROBNP in the last 8760 hours. HbA1C:  Recent Labs  03/06/16 0257  HGBA1C 13.4*   CBG:  Recent Labs Lab 03/06/16 0750 03/06/16 1147 03/06/16 1551 03/06/16 2015 03/07/16 0555  GLUCAP 184* 229* 292* 251* 278*   Lipid Profile: No results for input(s): CHOL, HDL, LDLCALC, TRIG, CHOLHDL, LDLDIRECT in the last 72 hours. Thyroid Function  Tests: No results for input(s): TSH, T4TOTAL, FREET4, T3FREE, THYROIDAB in the last 72 hours. Anemia Panel: No results for input(s): VITAMINB12, FOLATE, FERRITIN, TIBC, IRON, RETICCTPCT in the last 72 hours. Urine analysis:    Component Value Date/Time   COLORURINE COLORLESS (A) 03/05/2016 1745   APPEARANCEUR CLEAR 03/05/2016 1745   LABSPEC 1.026 03/05/2016 1745   PHURINE 6.0 03/05/2016 1745   GLUCOSEU >=500 (A) 03/05/2016 1745   HGBUR NEGATIVE 03/05/2016 1745   BILIRUBINUR NEGATIVE 03/05/2016 1745   BILIRUBINUR negative 07/11/2015 1149   BILIRUBINUR neg 06/14/2014 1124   KETONESUR 5 (A) 03/05/2016 1745   PROTEINUR NEGATIVE 03/05/2016 1745   UROBILINOGEN 0.2 07/11/2015 1149   UROBILINOGEN 0.2 10/23/2013 0838   NITRITE NEGATIVE 03/05/2016 1745   LEUKOCYTESUR NEGATIVE 03/05/2016 1745   Sepsis Labs: @LABRCNTIP (procalcitonin:4,lacticidven:4)  ) Recent Results (from the past 240 hour(s))  MRSA PCR Screening     Status: None   Collection Time: 03/05/16 11:38 PM  Result Value Ref Range Status   MRSA by PCR NEGATIVE NEGATIVE Final  Comment:        The GeneXpert MRSA Assay (FDA approved for NASAL specimens only), is one component of a comprehensive MRSA colonization surveillance program. It is not intended to diagnose MRSA infection nor to guide or monitor treatment for MRSA infections.          Radiology Studies: US Renal  Result Date: 03/05/2016 CLINICAL DATA:  Suprapubic pain for 1 week EXAM: RENAL / URINARY TRACT ULTRASOUND COMPLETE COMPARISON:  CT 10/23/2013 FINDINGS: Right Kidney: Length: 11.4 cm. Echogenicity within normal limits. No mass or hydronephrosis visualized. Left Kidney: Length: 11.6 cm. Echogenicity within normal limits. No mass or hydronephrosis visualized. Bladder: Foley catheter is present.  The bladder is empty. IMPRESSION: Negative renal ultrasound Electronically Signed   By: Jasmine Pang M.D.   On: 03/05/2016 22:18        Scheduled Meds: .  atorvastatin  40 mg Oral q1800  . enoxaparin (LOVENOX) injection  40 mg Subcutaneous QHS  . fenofibrate  160 mg Oral Daily  . Influenza vac split quadrivalent PF  0.5 mL Intramuscular Tomorrow-1000  . insulin aspart  0-9 Units Subcutaneous TID WC  . insulin aspart  3 Units Subcutaneous TID WC  . insulin detemir  5 Units Subcutaneous BID  . insulin regular  0-10 Units Intravenous TID WC  . lisinopril  2.5 mg Oral Daily  . pantoprazole  40 mg Oral Daily  . terazosin  2 mg Oral Daily   Continuous Infusions: . sodium chloride 75 mL/hr at 03/06/16 0415  . insulin (NOVOLIN-R) infusion Stopped (03/06/16 0450)     LOS: 0 days    Time spent: 40 minutes    Richarda Overlie, MD [facility]     If 7PM-7AM, please contact night-coverage [email or web address] Password [email or web address] 03/07/2016, 9:10 AM

## 2016-03-07 NOTE — Care Management Note (Signed)
Case Management Note  Patient Details  Name: Faustino CongressJuan Samperio MRN: [id number] Date of Birth: June 14, 1960  Subjective/Objective:  Pt in with diabetes, and trouble voiding. He is spanish speaking and from home with family.                   Action/Plan: Plan is for him to return home when medically stable. CM following for d/c needs.   Expected Discharge Date:                  Expected Discharge Plan:  Home/Self Care  In-House Referral:     Discharge planning Services     Post Acute Care Choice:    Choice offered to:     DME Arranged:    DME Agency:     HH Arranged:    HH Agency:     Status of Service:  In process, will continue to follow  If discussed at Long Length of Stay Meetings, dates discussed:    Additional Comments:  Kermit BaloKelli F Jamariah Tony, RN 03/07/2016, 11:16 AM

## 2016-03-08 DIAGNOSIS — N179 Acute kidney failure, unspecified: Secondary | ICD-10-CM | POA: Diagnosis not present

## 2016-03-08 DIAGNOSIS — E1101 Type 2 diabetes mellitus with hyperosmolarity with coma: Secondary | ICD-10-CM | POA: Diagnosis not present

## 2016-03-08 LAB — GLUCOSE, CAPILLARY
GLUCOSE-CAPILLARY: 193 mg/dL — AB (ref 65–99)
GLUCOSE-CAPILLARY: 222 mg/dL — AB (ref 65–99)

## 2016-03-08 MED ORDER — GLUCOSE BLOOD VI STRP: 1.0000 | ORAL_STRIP | 12 refills | Status: DC | PRN

## 2016-03-08 MED ORDER — INSULIN ASPART 100 UNIT/ML ~~LOC~~ SOLN: 5.0000 [IU] | Freq: Three times a day (TID) | SUBCUTANEOUS | 11 refills | Status: DC

## 2016-03-08 MED ORDER — "INSULIN SYRINGE 30G X 5/16"" 0.5 ML MISC": 3 refills | Status: DC

## 2016-03-08 MED ORDER — INSULIN DETEMIR 100 UNIT/ML ~~LOC~~ SOLN: 18.0000 [IU] | Freq: Two times a day (BID) | SUBCUTANEOUS | 11 refills | Status: DC

## 2016-03-08 NOTE — Discharge Summary (Signed)
Physician Discharge Summary  Joseph Roberts MRN: [id number] DOB/AGE: August 09, 1960 56 y.o.  PCP: Minerva Ends, MD   Admit date: 03/05/2016 Discharge date: 03/08/2016  Discharge Diagnoses:    Active Problems:   Type 2 diabetes mellitus with hyperglycemia (HCC)   Essential hypertension   Acute kidney injury ([place])   Urinary retention   Uncontrolled type 2 diabetes mellitus with complication (HCC)   HHNC (hyperglycemic hyperosmolar nonketotic coma) ([place])   Dyslipidemia    Follow-up recommendations Follow-up with PCP in 3-5 days , including all  additional recommended appointments as below Follow-up CBC, CMP in 3-5 days Patient states it is hard to f/u w/ PCP because of work schedule. Need to ensure compliance      Current Discharge Medication List    START taking these medications   Details  insulin aspart (NOVOLOG) 100 UNIT/ML injection Inject 5 Units into the skin 3 (three) times daily with meals. Qty: 10 mL, Refills: 11    insulin detemir (LEVEMIR) 100 UNIT/ML injection Inject 0.18 mLs (18 Units total) into the skin 2 (two) times daily. Qty: 10 mL, Refills: 11      CONTINUE these medications which have CHANGED   Details  glucose blood test strip 1 each by Other route as needed for other. Check CBG 3 times a day before meals Qty: 100 each, Refills: 12      CONTINUE these medications which have NOT CHANGED   Details  Blood Glucose Monitoring Suppl (ONE TOUCH ULTRA MINI) W/DEVICE KIT 1 each by Does not apply route daily. Test fasting daily    lisinopril (PRINIVIL,ZESTRIL) 5 MG tablet Take 1 tablet (5 mg total) by mouth daily. Qty: 90 tablet, Refills: 3   Associated Diagnoses: Controlled type 2 diabetes mellitus without complication, without long-term current use of insulin (HCC)    terazosin (HYTRIN) 1 MG capsule Take 1 mg by mouth daily.    triamcinolone cream (KENALOG) 0.5 % Apply 1 application topically 2 (two) times daily. Qty: 45 g, Refills: 0   Associated  Diagnoses: Rash and nonspecific skin eruption    atorvastatin (LIPITOR) 40 MG tablet Take 1 tablet (40 mg total) by mouth daily. Qty: 90 tablet, Refills: 0   Associated Diagnoses: Dyslipidemia with low high density lipoprotein (HDL) cholesterol with hypertriglyceridemia due to type 2 diabetes mellitus (HCC)    cyclobenzaprine (FLEXERIL) 10 MG tablet Take 1 tablet (10 mg total) by mouth 3 (three) times daily as needed for muscle spasms. Qty: 30 tablet, Refills: 0    dicyclomine (BENTYL) 10 MG capsule Take 1 pill 3 times daily if needed for abdominal pain Qty: 30 capsule, Refills: 1   Associated Diagnoses: RUQ abdominal pain    fenofibrate 160 MG tablet Take 1 tablet (160 mg total) by mouth daily. Qty: 90 tablet, Refills: 0    Guaifenesin (MUCINEX MAXIMUM STRENGTH) 1200 MG TB12 Take 1 tablet (1,200 mg total) by mouth every 12 (twelve) hours as needed. Qty: 14 tablet, Refills: 1   Associated Diagnoses: Acute bronchitis, unspecified organism    pantoprazole (PROTONIX) 40 MG tablet Take 1 tablet (40 mg total) by mouth daily. Qty: 30 tablet, Refills: 0   Associated Diagnoses: Heartburn      STOP taking these medications     metFORMIN (GLUCOPHAGE) 1000 MG tablet      saxagliptin HCl (ONGLYZA) 2.5 MG TABS tablet      glimepiride (AMARYL) 2 MG tablet          Discharge Condition: Stable   Discharge Instructions Get  Medicines reviewed and adjusted: Please take all your medications with you for your next visit with your Primary MD  Please request your Primary MD to go over all hospital tests and procedure/radiological results at the follow up, please ask your Primary MD to get all Hospital records sent to his/her office.  If you experience worsening of your admission symptoms, develop shortness of breath, life threatening emergency, suicidal or homicidal thoughts you must seek medical attention immediately by calling 911 or calling your MD immediately if symptoms less  severe.  You must read complete instructions/literature along with all the possible adverse reactions/side effects for all the Medicines you take and that have been prescribed to you. Take any new Medicines after you have completely understood and accpet all the possible adverse reactions/side effects.   Do not drive when taking Pain medications.   Do not take more than prescribed Pain, Sleep and Anxiety Medications  Special Instructions: If you have smoked or chewed Tobacco in the last 2 yrs please stop smoking, stop any regular Alcohol and or any Recreational drug use.  Wear Seat belts while driving.  Please note  You were cared for by a hospitalist during your hospital stay. Once you are discharged, your primary care physician will handle any further medical issues. Please note that NO REFILLS for any discharge medications will be authorized once you are discharged, as it is imperative that you return to your primary care physician (or establish a relationship with a primary care physician if you do not have one) for your aftercare needs so that they can reassess your need for medications and monitor your lab values.  Discharge Instructions    Ambulatory referral to Nutrition and Diabetic Education    Complete by:  As directed    Elevated A1C       No Known Allergies    Disposition: 01-Home or Self Care   Consults: Diabetes coordinator     Significant Diagnostic Studies:  US Renal  Result Date: 03-19-2016 CLINICAL DATA:  Suprapubic pain for 1 week EXAM: RENAL / URINARY TRACT ULTRASOUND COMPLETE COMPARISON:  CT 10/23/2013 FINDINGS: Right Kidney: Length: 11.4 cm. Echogenicity within normal limits. No mass or hydronephrosis visualized. Left Kidney: Length: 11.6 cm. Echogenicity within normal limits. No mass or hydronephrosis visualized. Bladder: Foley catheter is present.  The bladder is empty. IMPRESSION: Negative renal ultrasound Electronically Signed   By: Donavan Foil  M.D.   On: 03-19-2016 22:18        Filed Weights   19-Mar-2016 2345 03/06/16 2130  Weight: 71 kg (156 lb 8.4 oz) 75.9 kg (167 lb 6.4 oz)     Microbiology: Recent Results (from the past 240 hour(s))  MRSA PCR Screening     Status: None   Collection Time: 03-19-16 11:38 PM  Result Value Ref Range Status   MRSA by PCR NEGATIVE NEGATIVE Final    Comment:        The GeneXpert MRSA Assay (FDA approved for NASAL specimens only), is one component of a comprehensive MRSA colonization surveillance program. It is not intended to diagnose MRSA infection nor to guide or monitor treatment for MRSA infections.        Blood Culture    Component Value Date/Time   SDES STOOL 09/24/2009 1400   SDES STOOL 09/24/2009 1400   SDES STOOL 09/24/2009 1400   SPECREQUEST NONE 09/24/2009 1400   SPECREQUEST NONE 09/24/2009 1400   SPECREQUEST NONE 09/24/2009 1400   CULT  09/24/2009 1400  NO SALMONELLA, SHIGELLA, CAMPYLOBACTER, OR YERSINIA ISOLATED   REPTSTATUS 09/26/2009 FINAL 09/24/2009 1400   REPTSTATUS 09/29/2009 FINAL 09/24/2009 1400   REPTSTATUS 09/28/2009 FINAL 09/24/2009 1400      Labs: Results for orders placed or performed during the hospital encounter of 03/05/16 (from the past 48 hour(s))  Glucose, capillary     Status: Abnormal   Collection Time: 03/06/16 11:47 AM  Result Value Ref Range   Glucose-Capillary 229 (H) 65 - 99 mg/dL  Glucose, capillary     Status: Abnormal   Collection Time: 03/06/16  3:51 PM  Result Value Ref Range   Glucose-Capillary 292 (H) 65 - 99 mg/dL  Glucose, capillary     Status: Abnormal   Collection Time: 03/06/16  8:15 PM  Result Value Ref Range   Glucose-Capillary 251 (H) 65 - 99 mg/dL  Glucose, capillary     Status: Abnormal   Collection Time: 03/07/16  5:55 AM  Result Value Ref Range   Glucose-Capillary 278 (H) 65 - 99 mg/dL   Comment 1 Notify RN    Comment 2 Document in Chart   Comprehensive metabolic panel     Status: Abnormal    Collection Time: 03/07/16 10:46 AM  Result Value Ref Range   Sodium 133 (L) 135 - 145 mmol/L   Potassium 4.0 3.5 - 5.1 mmol/L   Chloride 108 101 - 111 mmol/L   CO2 22 22 - 32 mmol/L   Glucose, Bld 381 (H) 65 - 99 mg/dL   BUN 8 6 - 20 mg/dL   Creatinine, Ser 0.69 0.61 - 1.24 mg/dL   Calcium 8.5 (L) 8.9 - 10.3 mg/dL   Total Protein 5.6 (L) 6.5 - 8.1 g/dL   Albumin 2.8 (L) 3.5 - 5.0 g/dL   AST 32 15 - 41 U/L   ALT 32 17 - 63 U/L   Alkaline Phosphatase 87 38 - 126 U/L   Total Bilirubin 0.5 0.3 - 1.2 mg/dL   GFR calc non Af Amer >60 >60 mL/min   GFR calc Af Amer >60 >60 mL/min    Comment: (NOTE) The eGFR has been calculated using the CKD EPI equation. This calculation has not been validated in all clinical situations. eGFR's persistently <60 mL/min signify possible Chronic Kidney Disease.    Anion gap 3 (L) 5 - 15  CBC     Status: None   Collection Time: 03/07/16 10:46 AM  Result Value Ref Range   WBC 7.3 4.0 - 10.5 K/uL   RBC 4.79 4.22 - 5.81 MIL/uL   Hemoglobin 14.2 13.0 - 17.0 g/dL   HCT 42.2 39.0 - 52.0 %   MCV 88.1 78.0 - 100.0 fL   MCH 29.6 26.0 - 34.0 pg   MCHC 33.6 30.0 - 36.0 g/dL   RDW 13.1 11.5 - 15.5 %   Platelets 173 150 - 400 K/uL  Magnesium     Status: None   Collection Time: 03/07/16 10:46 AM  Result Value Ref Range   Magnesium 1.8 1.7 - 2.4 mg/dL  Lipid panel     Status: Abnormal   Collection Time: 03/07/16 10:46 AM  Result Value Ref Range   Cholesterol 135 0 - 200 mg/dL   Triglycerides 303 (H) <150 mg/dL   HDL 29 (L) >40 mg/dL   Total CHOL/HDL Ratio 4.7 RATIO   VLDL 61 (H) 0 - 40 mg/dL   LDL Cholesterol 45 0 - 99 mg/dL    Comment:        Total Cholesterol/HDL:CHD Risk Coronary  Heart Disease Risk Table                     Men   Women  1/2 Average Risk   3.4   3.3  Average Risk       5.0   4.4  2 X Average Risk   9.6   7.1  3 X Average Risk  23.4   11.0        Use the calculated Patient Ratio above and the CHD Risk Table to determine the  patient's CHD Risk.        ATP III CLASSIFICATION (LDL):  <100     mg/dL   Optimal  100-129  mg/dL   Near or Above                    Optimal  130-159  mg/dL   Borderline  160-189  mg/dL   High  >190     mg/dL   Very High   Glucose, capillary     Status: Abnormal   Collection Time: 03/07/16 11:16 AM  Result Value Ref Range   Glucose-Capillary 323 (H) 65 - 99 mg/dL   Comment 1 Notify RN    Comment 2 Document in Chart   Glucose, capillary     Status: Abnormal   Collection Time: 03/07/16  4:36 PM  Result Value Ref Range   Glucose-Capillary 179 (H) 65 - 99 mg/dL   Comment 1 Notify RN    Comment 2 Document in Chart   Glucose, capillary     Status: Abnormal   Collection Time: 03/07/16  9:03 PM  Result Value Ref Range   Glucose-Capillary 217 (H) 65 - 99 mg/dL   Comment 1 Notify RN    Comment 2 Document in Chart   Glucose, capillary     Status: Abnormal   Collection Time: 03/08/16  6:23 AM  Result Value Ref Range   Glucose-Capillary 193 (H) 65 - 99 mg/dL   Comment 1 Notify RN    Comment 2 Document in Chart      Lipid Panel     Component Value Date/Time   CHOL 135 03/07/2016 1046   TRIG 303 (H) 03/07/2016 1046   HDL 29 (L) 03/07/2016 1046   CHOLHDL 4.7 03/07/2016 1046   VLDL 61 (H) 03/07/2016 1046   LDLCALC 45 03/07/2016 1046     Lab Results  Component Value Date   HGBA1C 13.4 (H) 03/06/2016   HGBA1C 7.3 10/17/2015   HGBA1C 7.6 07/11/2015      HPI :  Joseph Samperiois a 56 y.o.HM (Spanish speaker) PMHx  DM Type 2 uncontrolled with complications, HLD   Who presents with complaints of 3 weeks of . urinary urgency. Marland Kitchen He complains of urinary frequency,increased thirst, and suprapubic pain pain with urination. He feels as though his bladder has not completely emptying out.  Lab work revealed glucose 804 with normal anion gap. Urinalysis was negative for ketones and positive for glucose. He was started on the glucose stabilizer  and a Foley catheter was placed after  patient was seen to have significant urinary retention  HOSPITAL COURSE:   Diabetes Type II, uncontrolled with complications/HHNS: Strongly suspect noncompliance -Patient presents with elevated glucose of 802 without anion gap. Patient placed on glucose stabilizer protocol. - 8/14 Hemoglobin A1c= 7.3. Hemoglobin A1c now 13.4  Discontinued Amaryl, saxagliptin, and metformin -Patient has received Diabetic education - increase Levemir 18 units BID  increasing meal coverage to 5  units TIDAC   Emphasize the need to be compliant   Acute kidney injury:(baseline Cr 0.78) Recent Labs       Lab Results  Component Value Date   CREATININE 0.77 03/06/2016   CREATININE 0.94 03/05/2016   CREATININE 1.24 03/05/2016         Urinary frequency suspect secondary to uncontrolled diabetes, doubt retention or incomplete emptying -Exact cause of urinary retention unknown at this time. Question BPH. - Renal ultrasound: Nondiagnostic Currently on Terazosin 2 mg daily  PVR about  59 mL , after Foley removal  Essential hypertension Continue lisinopril  Dyslipidemia:  Lipid panel-triglycerides 303, LDL 45 Continue Lipitor and fenofibrate 160 mg daily        Discharge Exam:   Blood pressure 113/65, pulse (!) 56, temperature 98.6 F (37 C), temperature source Oral, resp. rate 18, height '5\' 5"'  (1.651 m), weight 75.9 kg (167 lb 6.4 oz), SpO2 99 %. General: A/O 4, NAD,No acute respiratory distress Eyes: negative scleral hemorrhage, negative anisocoria, negative icterus ENT: Negative Runny nose, negative gingival bleeding, Neck:  Negative scars, masses, torticollis, lymphadenopathy, JVD Lungs: Clear to auscultation bilaterally without wheezes or crackles Cardiovascular: Regular rate and rhythm without murmur gallop or rub normal S1 and S2 Abdomen: negative abdominal pain, nondistended, positive soft, bowel sounds, no rebound, no ascites, no appreciable mass Extremities: No significant  cyanosis, clubbing, or edema bilateral lower extremities Skin: Negative rashes, lesions, ulcers Psychiatric:  Negative depression, negative anxiety, negative fatigue, negative mania  Central nervous system:  Cranial nerves II through XII intact, tongue/uvula midline, all extremities muscle strength 5/5, sensation intact throughout, negative dysarthria, negative expressive aphasia, negative receptive aphasia.     Follow-up Information    Minerva Ends, MD. Schedule an appointment as soon as possible for a visit.   Specialty:  Family Medicine Why:  Hospital follow-up Contact information: [address] [place] [address] [phone]           Signed: Reyne Dumas 03/08/2016, 9:44 AM        Time spent >45 mins

## 2016-03-08 NOTE — Care Management Note (Signed)
Case Management Note  Patient Details  Name: Demetrick Eichenberger MRN: [id number] Date of Birth: [birth year]  Subjective/Objective:                    Action/Plan: CM consulted for assistance with hospital follow up. CM met with the patient yesterday with Graciela interpreting to find out the issues he has had with PCP. He states he usually goes to which ever place he can get into when needs to be seen. Per pt record he has last gone to Primary care at [facility] last several office visits. Pt states he has had issues getting an appointment with them on Fridays d/t his work schedule. CM spoke to Primary Care at [facility] and they do not have any issues scheduling Mr [facility] appointments on Fridays or Saturdays. They are open on Sat from 8-4pm and are able to see him then as needed. CM scheduled him an appointment for tomorrow at 3 pm for hospital f/u. Information placed on the AVS.   Expected Discharge Date:                  Expected Discharge Plan:  Home/Self Care  In-House Referral:     Discharge planning Services     Post Acute Care Choice:    Choice offered to:     DME Arranged:    DME Agency:     HH Arranged:    [place] Agency:     Status of Service:  Completed, signed off  If discussed at [employer] of Stay Meetings, dates discussed:    Additional Comments:  Pollie Friar, RN 03/08/2016, 10:39 AM

## 2016-03-08 NOTE — Progress Notes (Signed)
Patient is being dc home. Dc instructions given. Patient verbalized understanding.

## 2016-03-09 ENCOUNTER — Ambulatory Visit (INDEPENDENT_AMBULATORY_CARE_PROVIDER_SITE_OTHER): Payer: 59 | Admitting: Family Medicine

## 2016-03-09 VITALS — BP 124/78 | HR 87 | Temp 98.2°F | Resp 16 | Ht 65.0 in | Wt 157.0 lb

## 2016-03-09 DIAGNOSIS — E1310 Other specified diabetes mellitus with ketoacidosis without coma: Secondary | ICD-10-CM | POA: Diagnosis not present

## 2016-03-09 DIAGNOSIS — R339 Retention of urine, unspecified: Secondary | ICD-10-CM | POA: Diagnosis not present

## 2016-03-09 DIAGNOSIS — R51 Headache: Secondary | ICD-10-CM

## 2016-03-09 DIAGNOSIS — R519 Headache, unspecified: Secondary | ICD-10-CM

## 2016-03-09 DIAGNOSIS — I10 Essential (primary) hypertension: Secondary | ICD-10-CM | POA: Diagnosis not present

## 2016-03-09 LAB — POCT URINALYSIS DIP (MANUAL ENTRY)
BILIRUBIN UA: NEGATIVE
BILIRUBIN UA: NEGATIVE
Glucose, UA: 500 — AB
LEUKOCYTES UA: NEGATIVE
Nitrite, UA: NEGATIVE
PROTEIN UA: NEGATIVE
Spec Grav, UA: 1.015
Urobilinogen, UA: 0.2
pH, UA: 5.5

## 2016-03-09 LAB — POC MICROSCOPIC URINALYSIS (UMFC): MUCUS RE: ABSENT

## 2016-03-09 LAB — GLUCOSE, POCT (MANUAL RESULT ENTRY): POC GLUCOSE: 257 mg/dL — AB (ref 70–99)

## 2016-03-09 MED ORDER — FENOFIBRATE 160 MG PO TABS: 160.0000 mg | ORAL_TABLET | Freq: Every day | ORAL | 0 refills | Status: DC

## 2016-03-09 MED ORDER — INSULIN ASPART 100 UNIT/ML ~~LOC~~ SOLN: 5.0000 [IU] | Freq: Three times a day (TID) | SUBCUTANEOUS | 11 refills | Status: DC

## 2016-03-09 MED ORDER — INSULIN REGULAR HUMAN 100 UNIT/ML IJ SOLN: 1.0000 [IU] | Freq: Three times a day (TID) | INTRAMUSCULAR | 11 refills | Status: DC

## 2016-03-09 MED ORDER — LISINOPRIL 5 MG PO TABS: 5.0000 mg | ORAL_TABLET | Freq: Every day | ORAL | 3 refills | Status: DC

## 2016-03-09 NOTE — Patient Instructions (Addendum)
Your blood sugar remains elevated today.   Please eat dinner and administer your Humilin 1 units.   Administer Humulin 1 unit breakfast and lunch.   I have changed your insulin as your insurance will not pay for the Novolog.  The medicaition will be ready for pick-up at your pharmacy.  Administer your Levemir twice daily in the morning and at  bedtime 18 units.  You must check your blood sugar daily with your monitor and notify us here if your blood sugars are persistently greater than 300.  Your headaches are likley related to your elevated blood sugar.   I am referring you to diabetes education.  I have refferred you to urology to further evaluated your problem of urinary retention.    Su nivel de azcar en la sangre sigue siendo elevado hoy.  Por favor, cene y administre sus Humilin 1 unidades.  Administre Humulin 1 unidad desayuno y almuerzo.  He cambiado su insulina ya que su seguro no Therapist, sports.  La medicina estar lista para recogerla en su farmacia.  Administre su Levemir dos veces al da por la maana y al acostarse 18 unidades.  Debe controlar su nivel de azcar en la sangre diariamente con su monitor y notificarnos aqu si su nivel de azcar en la sangre es persistentemente superior a 300.  Es probable que sus dolores de cabeza estn relacionados con su nivel elevado de Banker.  Te remito a la educacin sobre la diabetes.  Le he recomendado la urologa para evaluar su problema de retencin urinaria.     IF you received an x-ray today, you will receive an invoice from [facility]. Please contact [facility] at [phone] with questions or concerns regarding your invoice.   IF you received labwork today, you will receive an invoice from [facility]. Please contact [facility] at [phone] with questions or concerns regarding your invoice.   Our billing staff will not be able to assist you with questions regarding bills  from these companies.  You will be contacted with the lab results as soon as they are available. The fastest way to get your results is to activate your My Chart account. Instructions are located on the last page of this paperwork. If you have not heard from [place] regarding the results in 2 weeks, please contact this office.    Control del nivel de glucosa en la sangre - Adultos (Blood Glucose Monitoring, Adult) El control de la glucosa en la sangre (tambin llamada azcar en la sangre) lo ayudar a tener la diabetes bajo control. Tambin ayuda a que usted y Lexicographer la diabetes y determinen si el tratamiento es Engineer, manufacturing. POR QU HAY QUE CONTROLAR LA GLUCOSA EN LA SANGRE?  Esto puede ayudar a comprender de United Stationers, la actividad fsica y los medicamentos inciden en los niveles de [place].  Le permite conocer el nivel de glucosa en la sangre en cualquier momento dado. Puede saber rpidamente si el nivel es bajo (hipoglucemia) o alto (hiperglucemia).  Puede ser de ayuda para que usted y el mdico sepan cmo Presenter, broadcasting,  y para entender cmo controlar una enfermedad o ajustar los medicamentos para hacer ejercicio. CUNDO DEBE HACERSE LAS PRUEBAS? El mdico lo ayudar a decidir con qu frecuencia deber [employer] niveles de glucosa en la [place]. Esto puede depender del tipo de diabetes que tenga, su control de la diabetes o los tipos de medicamentos que tome. Asegrese de [employer]  de la glucosa en la sangre, de modo que esta informacin pueda ser revisada por su mdico. A continuacin puede ver ejemplos de los momentos para Education officer, environmentalrealizar la prueba que el mdico puede Neurosurgeonsugerir. Diabetes tipo1   Contrlese la glucemia por lo menos 2 veces al da.  Adems, contrlese la glucemia en estos momentos:  Antes de cada inyeccin de insulina.  Antes y despus de hacer ejercicio.  Entre las comidas.  Dos horas despus de una comida.  Entre las  2:00 a. m. y las 3:00 a. m. algn que Bankerotro da.  Tal vez deba controlarse la glucemia con ms frecuencia en estos casos:  Si Bots[place]ausa una bomba de Los Alvarezinsulina.  Si necesita varias inyecciones diarias.  Si la diabetes no est bien controlada.  Si est enfermo. Diabetes tipo2   Si est utilizando insulina, realice la medicin al menos 2 veces al C.H. Robinson Worldwideda. Sin embargo, es Insurance claims handlermejor hacer una medicin antes de cada inyeccin de Waterflowinsulina.  Si toma medicamentos por boca (va oral), hgase la prueba 2veces por da.  Si sigue una dieta controlada, hgase la prueba una vez por da.  Si la diabetes no est bien controlada o si est enfermo, puede ser necesario que se controle con ms frecuencia. CMO CONTROLAR EL NIVEL DE GLUCOSA EN LA SANGRE Insumos necesarios   Medidor de glucosa en la sangre.  Tiras reactivas para el medidor. Cada medidor tiene sus propias tiras reactivas. Debe usar las tiras reactivas correspondientes a su medidor.  Una aguja para pinchar (lanceta).  Un dispositivo que sujeta la lanceta (dispositivo de puncin).  Un diario o libro de anotaciones para YRC Worldwideanotar los resultados. Procedimiento   Lave sus manos con agua y [place]jabn. No se recomienda usar alcohol.  Pnchese el costado del dedo (no la punta) con Optometristla lanceta.  Apriete suavemente el dedo hasta que aparezca una pequea gota de Offerlesangre.  Siga las instrucciones que vienen con el medidor para Public affairs consultantinsertar la tira Firefighterreactiva, Contractoraplicar la sangre sobre la tira y usar el medidor de Horticulturist, commercialglucosa en la sangre. Otras zonas de las que se puede tomar sangre para la prueba  Algunos medidores le permiten tomar sangre para la prueba de otras zonas del cuerpo (que no son el dedo). Estas reas se llaman sitios alternativos. Los sitios alternativos ms comunes son los siguientes:  El Product managerantebrazo.  El muslo.  La zona posterior de la parte inferior de la pierna.  La palma de la mano. El flujo de sangre en estas zonas es ms lento. Por lo tanto, los  valores de glucosa en la sangre que obtenga pueden estar demorados, y los nmeros son diferentes de los que obtiene de los dedos. No saque sangre de sitios alternativos si cree que tiene hipoglucemia. Los valores no sern precisos. Siempre extraiga del dedo si tiene hipoglucemia. Adems, si no puede darse cuenta cuando tiene bajos los niveles (hipoglucemia asintomtica), siempre extraiga sangre de los dedos para los controles de glucosa en la Co[place]angre. CONSEJOS ADICIONALES PARA EL CONTROL DE LA GLUCOSA  No vuelva a utilizar las lancetas.  Siempre tenga los insumos a mano.  Todos los medidores de glucosa incluyen un nmero de telfono "directo", disponible las 24 horas, al que podr llamar si tiene preguntas o French Southern Territoriesnecesita ayuda.  Ajuste (calibre) el medidor de glucosa con una solucin de control despus de terminar algunas cajas de tiras reactivas. LLEVE REGISTROS DE LOS NIVELES DE GLUCOSA EN LA SANGRE Es recomendable llevar un diario o un registro de los valores de glucosa en la  sangre. La [employer]son de los medidores de glucosa, sino todos, conservan el registro de la glucosa en el dispositivo. Algunos medidores permiten descargar los registros a su computadora. Llevar un registro de los valores de glucosa en la sangre es especialmente til si desea observar los patrones. Haga anotaciones simultneas con la Microbiologist de los valores de glucosa en la sangre debido a que podra olvidar lo que ocurri en el momento exacto. Llevar un buen registro los ayudar a usted y al mdico a Printmaker juntos para Personnel officer un buen control de la diabetes.  Esta informacin no tiene Theme park manager el consejo del mdico. Asegrese de hacerle al mdico cualquier pregunta que tenga. Document Released: 02/19/2005 Document Revised: 06/13/2015 Elsevier Interactive Patient Education  [employer].  that

## 2016-03-09 NOTE — Progress Notes (Signed)
Patient ID: Joseph Roberts, male    DOB: 1961-01-03, 56 y.o.   MRN: [id number]  PCP: Yvetta Coder  Chief Complaint  Patient presents with  . Follow-up    Hospital follow up - Diabetes and Urinary retention    Subjective:  HPI-New Patient To Me As Provider  [phone] [facility]   56 year old, male, current everyday smoker, presents for a hospital follow-up. Chronic problems include hypertension, GERD, Uncontrolled Type 2 Diabetes, and Dyslipidemia.  He presented to [facility] Emergency Department on 03/05/16 and was admitted for Acute Kidney Injury, Urinary Retention, Hypertension, Uncontrolled T2DM, and HHNC. He remained hospitalized for 3 days and was discharged with instructions to follow-up with PCP. He presents today for CMP, CBC, and Glucose recheck today.  Urinary Retention  Pt reports when illness began he experienced pain with urination for about 1 days and subsequently was unable to void at all.   He presented to [facility] ED and upon arrival blood sugar was greater than 600. He reports that he was never provided an explanation for underlying cause of urinary retention.  Uncontrolled Diabetes Mellitus  Reports that he occasional monitors blood sugar. And readings are sometime in 200's upon awakening and as high as 400 in the middle of day. He is currently managing diabetes with insulin Detemir (Levemir) 18 units twice daily. Reports that he is having trouble obtaining Novolog as he is awaiting for insurance approval to cover medication.  His last dose of Novolog was 03/08/2016 the day he was discharged from the hospital. A1C in August 7.3 and is now 13.4, pt attributes to increase to eating fast food during odd hours of the night.  Hypertension, controlled Takes lisinopril 5 mg daily. Denies side effects. He requests refill as he has been out of medication since prior to hospitalization.  Headache  Intermittent, sometime persistent headache 2-3 weeks. Headache  unilateral, although he denies dizziness or lightheadedness. Denies confusion, forgetfulness, or gait instability.  Social History   Social History  . Marital status: Married    Spouse name: N/A  . Number of children: N/A  . Years of education: N/A   Occupational History  . Not on file.   Social History Main Topics  . Smoking status: Current Every Day Smoker    Packs/day: 0.50    Years: 30.00    Types: Cigarettes  . Smokeless tobacco: Never Used  . Alcohol use No  . Drug use: No  . Sexual activity: Not on file   Other Topics Concern  . Not on file   Social History Narrative  . No narrative on file    Family History  Problem Relation Age of Onset  . Diabetes Mother   . Diabetes Sister   . Diabetes Brother    Review of Systems See HPI  Patient Active Problem List   Diagnosis Date Noted  . Essential hypertension 03/06/2016  . Acute kidney injury ([place]) 03/06/2016  . Urinary retention 03/06/2016  . Uncontrolled type 2 diabetes mellitus with complication ([place])   . HHNC (hyperglycemic hyperosmolar nonketotic coma) ([place])   . Dyslipidemia   . Hyperglycemia 03/05/2016  . Type 2 diabetes mellitus with hyperglycemia ([place]) 03/05/2016  . 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 Dyslipidemia with low high density lipoprotein (HDL) cholesterol with hypertriglyceridemia due to type 2 diabetes mellitus ([place]) 11/03/2013  . Gall bladder polyp 10/24/2013  . GERD (gastroesophageal reflux disease) 10/24/2013  . Diabetes type 2, controlled ([place]) 10/23/2013    No Known Allergies  Prior to Admission medications   Medication Sig Start Date End Date Taking? Authorizing Provider  Blood Glucose Monitoring Suppl (ONE TOUCH ULTRA MINI) W/DEVICE KIT 1 each by Does not apply route daily. Test fasting daily   Yes Historical Provider, MD  glucose blood test strip 1 each by Other route as needed for other. Check CBG 3 times a day before meals 03/08/16  Yes Richarda Overlie, MD  insulin detemir (LEVEMIR)  100 UNIT/ML injection Inject 0.18 mLs (18 Units total) into the skin 2 (two) times daily. 03/08/16  Yes Richarda Overlie, MD  Insulin Syringe-Needle U-100 (INSULIN SYRINGE .5CC/30GX5/16") 30G X 5/16" 0.5 ML MISC Administer insulin as instructed 03/08/16  Yes Richarda Overlie, MD  triamcinolone cream (KENALOG) 0.5 % Apply 1 application topically 2 (two) times daily. 10/17/15  Yes Morrell Riddle, PA-C  atorvastatin (LIPITOR) 40 MG tablet Take 1 tablet (40 mg total) by mouth daily. Patient not taking: Reported on 03/09/2016 10/17/15   Morrell Riddle, PA-C  cyclobenzaprine (FLEXERIL) 10 MG tablet Take 1 tablet (10 mg total) by mouth 3 (three) times daily as needed for muscle spasms. Patient not taking: Reported on 03/09/2016 06/14/14   Carmelina Dane, MD  dicyclomine (BENTYL) 10 MG capsule Take 1 pill 3 times daily if needed for abdominal pain Patient not taking: Reported on 03/09/2016 07/11/15   Peyton Najjar, MD  fenofibrate 160 MG tablet Take 1 tablet (160 mg total) by mouth daily. Patient not taking: Reported on 03/09/2016 10/18/15   Morrell Riddle, PA-C  insulin aspart (NOVOLOG) 100 UNIT/ML injection Inject 5 Units into the skin 3 (three) times daily with meals. Patient not taking: Reported on 03/09/2016 03/08/16   Richarda Overlie, MD  lisinopril (PRINIVIL,ZESTRIL) 5 MG tablet Take 1 tablet (5 mg total) by mouth daily. Patient not taking: Reported on 03/09/2016 10/17/15   Morrell Riddle, PA-C  pantoprazole (PROTONIX) 40 MG tablet Take 1 tablet (40 mg total) by mouth daily. Patient not taking: Reported on 03/09/2016 10/17/15   Morrell Riddle, PA-C  terazosin (HYTRIN) 1 MG capsule Take 1 mg by mouth daily. 02/25/16   Historical Provider, MD  Past Medical, Surgical Family and Social History reviewed and updated.   Objective:   Today's Vitals   03/09/16 1517  BP: 124/78  Pulse: 87  Resp: 16  Temp: 98.2 F (36.8 C)  TempSrc: Oral  SpO2: 98%  Weight: 157 lb (71.2 kg)  Height: 5\' 5"  (1.651 m)    Wt Readings from Last 3  Encounters:  03/09/16 157 lb (71.2 kg)  03/06/16 167 lb 6.4 oz (75.9 kg)  10/17/15 176 lb (79.8 kg)    Physical Exam  Constitutional: He is oriented to person, place, and time. He appears well-developed and well-nourished.  HENT:  Head: Normocephalic and atraumatic.  Mouth/Throat: Oropharynx is clear and moist.  Eyes: Conjunctivae and EOM are normal. Pupils are equal, round, and reactive to light.  Neck: Normal range of motion.  Cardiovascular: Normal rate, regular rhythm, normal heart sounds and intact distal pulses.  Exam reveals no gallop.   No murmur heard. Pulmonary/Chest: Effort normal and breath sounds normal.  Musculoskeletal: Normal range of motion.  Neurological: He is alert and oriented to person, place, and time. He has normal strength. He displays a negative Romberg sign. GCS eye subscore is 4. GCS verbal subscore is 5. GCS motor subscore is 6.  Skin: Skin is warm and dry.  Psychiatric: He has a normal mood and affect. His speech is  normal and behavior is normal. Judgment and thought content normal. Cognition and memory are normal.       Assessment & Plan:  1. Urinary retention 2. Uncontrolled type 2 diabetes mellitus with ketoacidosis without coma, unspecified long term insulin use status ([place]) 3. Hypertension, unspecified type 4. Intractable headache, unspecified chronicity pattern, unspecified headache type  Mr. Samperio presents today for a hospital follow-up. Point care of glucose indicated blood sugar is significantly elevated 257. I called [facility] and confirmed patient's prescription plan will not cover Novolog and medication is over $300.00. Looked back at previous medications, and I do not see where patient was every placed on Metformin. GFR and renal function is stable. Will initiate therapy.  Also concerned if there is further underlying cause for Acute Kidney Injury and urinary retention, will check as PSA level, continue patient on terazosin as he is  asymptomatic, and refer patient to urology for additional follow-up.   Blood is currently controlled. Will refill lisinopril and fenofibrate today.  Plan: . lisinopril (PRINIVIL,ZESTRIL) 5 MG tablet    Sig: Take 1 tablet (5 mg total) by mouth daily.    Dispense:  90 tablet    Refill:  3  . fenofibrate 160 MG tablet    Sig: Take 1 tablet (160 mg total) by mouth daily.    Dispense:  90 tablet    Refill:  0    Order Specific Question:   Supervising Provider    Answer:   SHAW, EVA N [[id number]]  . insulin regular (HUMULIN R) 250 units/2.48m (100 units/mL) injection    Sig: Inject 0.01 mLs (1 Units total) into the skin 3 (three) times daily before meals. Check blood sugar before each administration.    Dispense:  10 mL  . metFORMIN (GLUCOPHAGE) 500 MG tablet    Sig: Take 2 tablets (1,000 mg total) by mouth 2 (two) times daily with a meal.    Dispense:  180 tablet    Refill:  3   Educated patient on proper administration of insulin as he was administering into cubital vein oppose to subcutaneous fat .  Return in two weeks for follow-up on diabetes. Requests spanish speaking provider-[facility]  Please follow-up sooner if blood sugar is consistently greater than 300.  Referrals made to the following: -Ambulatory Referral Diabetes Educator  -Ambulatory Referral Urology  KCarroll Sage HKenton Kingfisher MSN, FNP-C [facility] at [facility]

## 2016-03-10 LAB — COMPREHENSIVE METABOLIC PANEL
A/G RATIO: 1.3 (ref 1.2–2.2)
ALBUMIN: 4.2 g/dL (ref 3.5–5.5)
ALT: 50 IU/L — ABNORMAL HIGH (ref 0–44)
AST: 48 IU/L — ABNORMAL HIGH (ref 0–40)
Alkaline Phosphatase: 116 IU/L (ref 39–117)
BILIRUBIN TOTAL: 0.3 mg/dL (ref 0.0–1.2)
BUN/Creatinine Ratio: 14 (ref 9–20)
BUN: 11 mg/dL (ref 6–24)
CALCIUM: 10 mg/dL (ref 8.7–10.2)
CHLORIDE: 99 mmol/L (ref 96–106)
CO2: 21 mmol/L (ref 18–29)
Creatinine, Ser: 0.8 mg/dL (ref 0.76–1.27)
GFR, EST AFRICAN AMERICAN: 117 mL/min/{1.73_m2} (ref >59)
GFR, EST NON AFRICAN AMERICAN: 101 mL/min/{1.73_m2} (ref >59)
GLOBULIN, TOTAL: 3.2 g/dL (ref 1.5–4.5)
Glucose: 409 mg/dL — ABNORMAL HIGH (ref 65–99)
POTASSIUM: 4.7 mmol/L (ref 3.5–5.2)
Sodium: 136 mmol/L (ref 134–144)
TOTAL PROTEIN: 7.4 g/dL (ref 6.0–8.5)

## 2016-03-10 LAB — CBC WITH DIFFERENTIAL/PLATELET
BASOS: 1 %
Basophils Absolute: 0.1 10*3/uL (ref 0.0–0.2)
EOS (ABSOLUTE): 0.2 10*3/uL (ref 0.0–0.4)
EOS: 3 %
Hematocrit: 46.9 % (ref 37.5–51.0)
Hemoglobin: 15.8 g/dL (ref 13.0–17.7)
IMMATURE GRANS (ABS): 0 10*3/uL (ref 0.0–0.1)
Immature Granulocytes: 0 %
Lymphocytes Absolute: 1.7 10*3/uL (ref 0.7–3.1)
Lymphs: 27 %
MCH: 30 pg (ref 26.6–33.0)
MCHC: 33.7 g/dL (ref 31.5–35.7)
MCV: 89 fL (ref 79–97)
MONOS ABS: 0.7 10*3/uL (ref 0.1–0.9)
Monocytes: 11 %
NEUTROS ABS: 3.8 10*3/uL (ref 1.4–7.0)
Neutrophils: 58 %
PLATELETS: 230 10*3/uL (ref 150–379)
RBC: 5.26 x10E6/uL (ref 4.14–5.80)
RDW: 13.2 % (ref 12.3–15.4)
WBC: 6.5 10*3/uL (ref 3.4–10.8)

## 2016-03-10 LAB — PSA: Prostate Specific Ag, Serum: 4.2 ng/mL — ABNORMAL HIGH (ref 0.0–4.0)

## 2016-03-10 MED ORDER — METFORMIN HCL 500 MG PO TABS: 1000.0000 mg | ORAL_TABLET | Freq: Two times a day (BID) | ORAL | 3 refills | Status: DC

## 2016-03-11 ENCOUNTER — Telehealth: Payer: Self-pay | Admitting: Family Medicine

## 2016-03-11 ENCOUNTER — Encounter: Payer: Self-pay | Admitting: Family Medicine

## 2016-03-11 NOTE — Telephone Encounter (Signed)
Translator services # ID [id number]  Spoke with Mr. Clare CharonSamperio and he advised his blood sugar today 225 and reading last night 320. He reports administering 1 unit of regular insulin 3 x daily meals and Levemir 18 units twice daily. Advised that I am adding Metformin 1,000 mg twice daily as his renal function is intact. Provided him with a simplistic sliding scale regimen for glycemic control:  Blood sugar greater than 150, administer 2 units of regular insulin Less 150 administers 1 unit of regular insulin Less than 100, do not administer regular insulin  Continue 18 units of Levemir, twice daily.  Advised to begin metformin today along with the prescribed regimen above.  Continue to check blood sugar at least 3 times daily.  Will follow-up via phone later in the week.  Godfrey PickKimberly S. Tiburcio PeaHarris, MSN, FNP-C Primary Care at [facility] [phone]

## 2016-03-27 ENCOUNTER — Encounter: Payer: Self-pay | Admitting: Dietician

## 2016-03-27 ENCOUNTER — Encounter: Payer: 59 | Attending: Family Medicine | Admitting: Dietician

## 2016-03-27 DIAGNOSIS — E1310 Other specified diabetes mellitus with ketoacidosis without coma: Secondary | ICD-10-CM | POA: Insufficient documentation

## 2016-03-27 DIAGNOSIS — Z713 Dietary counseling and surveillance: Secondary | ICD-10-CM | POA: Insufficient documentation

## 2016-03-27 DIAGNOSIS — E1165 Type 2 diabetes mellitus with hyperglycemia: Secondary | ICD-10-CM

## 2016-03-27 DIAGNOSIS — E118 Type 2 diabetes mellitus with unspecified complications: Secondary | ICD-10-CM

## 2016-03-27 DIAGNOSIS — IMO0002 Reserved for concepts with insufficient information to code with codable children: Secondary | ICD-10-CM

## 2016-03-27 DIAGNOSIS — Z794 Long term (current) use of insulin: Secondary | ICD-10-CM

## 2016-03-27 NOTE — Patient Instructions (Addendum)
Medication instruction:  Take as prescribed by your doctor.    Take 18 units Levemir each am and pm  Take 2 units Humulin R before each meal Be as active as possible.  Try to walk most days. Please try to stop smoking. Limit juice to 4 ounces. Tea should be unsweetened. Avoid skipping meals.  Aim for breakfast, lunch, and dinner daily.     Have a small snack if hungry.  Continue to check your blood sugar at least 2 times per day.

## 2016-03-27 NOTE — Progress Notes (Signed)
Diabetes Self-Management Education  Visit Type: Follow-up   Appt. Start Time: 1545 Appt. End Time: 1655  03/27/2016  [name], identified by name and date of birth, is a 56 y.o. male with a diagnosis of Diabetes:  2.  This is uncontrolled with A1C of 123.4% 03/06/16.  He has been on insulin and has been losing weight.  He has been taking the insulin incorrectly (Levemir 18 units tid and Novolog 2 units bid).  I instructed him on correct medication management.  He follows up MD/PA April 12, 2016.  Patient lives with his 55 yo son.  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  His son does the same thing.  He eats out most often due to traveling.  He is from [place]  ASSESSMENT  Height 5\' 4"  (1.626 m), weight 156 lb (70.8 kg). Body mass index is 26.78 kg/m.      Diabetes Self-Management Education - 03/27/16 1548      Visit Information   Visit Type Follow-up     Health Coping   How would you rate your overall health? Good     Psychosocial Assessment   Patient Belief/Attitude about Diabetes Motivated to manage diabetes   Self-care barriers None   Self-management support Doctor's office   Other persons present Patient   Patient Concerns Nutrition/Meal planning;Glycemic Control   Special Needs Simplified materials;Other (comment);None  interpretor   Preferred Learning Style No preference indicated   Learning Readiness Ready   How often do you need to have someone help you when you read instructions, pamphlets, or other written materials from your doctor or pharmacy? 1 - Never   What is the last grade level you completed in school? 3rd grade     Pre-Education Assessment   Patient understands the diabetes disease and treatment process. Needs Review   Patient understands incorporating nutritional management into lifestyle. Needs Review   Patient undertands incorporating physical activity into lifestyle. Needs  Review   Patient understands using medications safely. Needs Instruction   Patient understands monitoring blood glucose, interpreting and using results Needs Instruction   Patient understands prevention, detection, and treatment of acute complications. Needs Instruction   Patient understands prevention, detection, and treatment of chronic complications. Needs Review   Patient understands how to develop strategies to address psychosocial issues. Needs Review   Patient understands how to develop strategies to promote health/change behavior. Needs Review     Complications   Last HgB A1C per patient/outside source 13.4 %  03/06/16 increased from 7.3% 10/17/15   How often do you check your blood sugar? 1-2 times/day   Fasting Blood glucose range (mg/dL) [phone]   Postprandial Blood glucose range (mg/dL) 098-119;[id number];180-200   Number of hypoglycemic episodes per month 0   Number of hyperglycemic episodes per week 3   Can you tell when your blood sugar is high? No   Have you had a dilated eye exam in the past 12 months? Yes   Have you had a dental exam in the past 12 months? Yes   Are you checking your feet? Yes   How many days per week are you checking your feet? 2     Dietary Intake   Breakfast chicken biscuit, yogurt, 16 ounces OJ  7 am   Snack (morning) none   Lunch [employer] restaurant or store (chicken,beans, broccoli, salad  1 pm   Snack (afternoon) fruit, ham sandwich and  cheese   Dinner nothing except milk and cookies at times  before bed   Beverage(s) tomato juice, 16 ounces OJ per day, water, occasional coffee with sugar or artificial sugar,  half and half tea,     Exercise   Exercise Type ADL's   How many days per week to you exercise? 0   How many minutes per day do you exercise? 0   Total minutes per week of exercise 0     Patient Education   Previous Diabetes Education Yes (please comment)   Disease state  --  2 years ago     Subsequent Visit   Since your last visit have  you continued or begun to take your medications as prescribed? No  He remembers to take his insulin and metformin but is taking the insulin incorrectly.   Since your last visit have you had your blood pressure checked? Yes   Is your most recent blood pressure lower, unchanged, or higher since your last visit? --  unknown   Since your last visit have you experienced any weight changes? Loss   Weight Loss (lbs) 35  25 lbs in the past 3 months and 10 lbs in the past 3 weeks.   Since your last visit, are you checking your blood glucose at least once a day? Yes      Individualized Plan for Diabetes Self-Management Training:   Learning Objective:  Patient will have a greater understanding of diabetes self-management. Patient education plan is to attend individual and/or group sessions per assessed needs and concerns.   Plan:   Patient Instructions  Medication instruction:  Take as prescribed by your doctor.    Take 18 units Levemir each am and pm  Take 2 units Humulin R before each meal Be as active as possible.  Try to walk most days. Please try to stop smoking. Limit juice to 4 ounces. Tea should be unsweetened. Avoid skipping meals.  Aim for breakfast, lunch, and dinner daily.     Have a small snack if hungry.  Continue to check your blood sugar at least 2 times per day.     Expected Outcomes:   Patient expressed interest in learning but language barrier despite interpretor at times.  Education material provided: Living Well with Diabetes, Meal plan card and My Plate- all in Spanish  If problems or questions, patient to contact team via:  Phone and Email  Future DSME appointment:

## 2016-04-06 ENCOUNTER — Encounter: Payer: Self-pay | Admitting: Family Medicine

## 2016-04-06 ENCOUNTER — Ambulatory Visit: Payer: 59 | Admitting: Skilled Nursing Facility1

## 2016-04-06 DIAGNOSIS — H2513 Age-related nuclear cataract, bilateral: Secondary | ICD-10-CM | POA: Diagnosis not present

## 2016-04-06 DIAGNOSIS — E119 Type 2 diabetes mellitus without complications: Secondary | ICD-10-CM | POA: Diagnosis not present

## 2016-04-06 LAB — HM DIABETES EYE EXAM

## 2016-06-08 ENCOUNTER — Ambulatory Visit: Payer: 59 | Admitting: Dietician

## 2016-07-13 ENCOUNTER — Encounter: Payer: Self-pay | Admitting: Urgent Care

## 2016-07-13 ENCOUNTER — Ambulatory Visit (INDEPENDENT_AMBULATORY_CARE_PROVIDER_SITE_OTHER): Payer: 59 | Admitting: Urgent Care

## 2016-07-13 VITALS — BP 111/70 | HR 63 | Temp 98.2°F | Resp 16 | Ht 65.0 in | Wt 159.2 lb

## 2016-07-13 DIAGNOSIS — IMO0001 Reserved for inherently not codable concepts without codable children: Secondary | ICD-10-CM

## 2016-07-13 DIAGNOSIS — R12 Heartburn: Secondary | ICD-10-CM

## 2016-07-13 DIAGNOSIS — E1165 Type 2 diabetes mellitus with hyperglycemia: Secondary | ICD-10-CM

## 2016-07-13 DIAGNOSIS — H538 Other visual disturbances: Secondary | ICD-10-CM | POA: Diagnosis not present

## 2016-07-13 DIAGNOSIS — E782 Mixed hyperlipidemia: Secondary | ICD-10-CM | POA: Diagnosis not present

## 2016-07-13 DIAGNOSIS — H5700 Unspecified anomaly of pupillary function: Secondary | ICD-10-CM | POA: Diagnosis not present

## 2016-07-13 DIAGNOSIS — Z794 Long term (current) use of insulin: Secondary | ICD-10-CM

## 2016-07-13 LAB — LIPID PANEL
CHOL/HDL RATIO: 4.6 ratio (ref 0.0–5.0)
Cholesterol, Total: 175 mg/dL (ref 100–199)
HDL: 38 mg/dL — AB (ref >39)
LDL CALC: 105 mg/dL — AB (ref 0–99)
TRIGLYCERIDES: 161 mg/dL — AB (ref 0–149)
VLDL CHOLESTEROL CAL: 32 mg/dL (ref 5–40)

## 2016-07-13 LAB — COMPREHENSIVE METABOLIC PANEL
ALT: 15 IU/L (ref 0–44)
AST: 15 IU/L (ref 0–40)
Albumin/Globulin Ratio: 1.2 (ref 1.2–2.2)
Albumin: 4 g/dL (ref 3.5–5.5)
Alkaline Phosphatase: 109 IU/L (ref 39–117)
BUN/Creatinine Ratio: 17 (ref 9–20)
BUN: 14 mg/dL (ref 6–24)
Bilirubin Total: 0.7 mg/dL (ref 0.0–1.2)
CALCIUM: 9.5 mg/dL (ref 8.7–10.2)
CO2: 21 mmol/L (ref 18–29)
CREATININE: 0.81 mg/dL (ref 0.76–1.27)
Chloride: 102 mmol/L (ref 96–106)
GFR, EST AFRICAN AMERICAN: 116 mL/min/{1.73_m2} (ref >59)
GFR, EST NON AFRICAN AMERICAN: 100 mL/min/{1.73_m2} (ref >59)
GLUCOSE: 268 mg/dL — AB (ref 65–99)
Globulin, Total: 3.3 g/dL (ref 1.5–4.5)
POTASSIUM: 4.4 mmol/L (ref 3.5–5.2)
Sodium: 136 mmol/L (ref 134–144)
Total Protein: 7.3 g/dL (ref 6.0–8.5)

## 2016-07-13 LAB — POCT GLYCOSYLATED HEMOGLOBIN (HGB A1C): HEMOGLOBIN A1C: 11.2

## 2016-07-13 MED ORDER — SITAGLIP PHOS-METFORMIN HCL ER 50-1000 MG PO TB24: 1.0000 | ORAL_TABLET | Freq: Two times a day (BID) | ORAL | 1 refills | Status: DC

## 2016-07-13 MED ORDER — PANTOPRAZOLE SODIUM 40 MG PO TBEC
40.0000 mg | DELAYED_RELEASE_TABLET | Freq: Every day | ORAL | 1 refills | Status: DC
Start: 2016-07-13 — End: 2017-01-23

## 2016-07-13 MED ORDER — INSULIN DETEMIR 100 UNIT/ML ~~LOC~~ SOLN: 18.0000 [IU] | Freq: Two times a day (BID) | SUBCUTANEOUS | 11 refills | Status: DC

## 2016-07-13 MED ORDER — INSULIN REGULAR HUMAN 100 UNIT/ML IJ SOLN: 2.0000 [IU] | Freq: Three times a day (TID) | INTRAMUSCULAR | 11 refills | Status: DC

## 2016-07-13 NOTE — Progress Notes (Signed)
MRN: [id number]  Subjective:   Joseph Roberts is a 56 y.o. male who presents for follow up of Type 2 Diabetes Mellitus. Diagnosis was made 2013. Patient is currently managed with Levemir 18 units twice daily, Humulin 2 units in the morning, Metformin 500mg  twice daily. Patient is checking home blood sugars. Home blood sugar is generally 200's. Admits slightly blurred vision, mild polydipsia, polyuria. Patient denies chest pain, nausea, vomiting, abdominal pain, hematuria, polyuria, skin infections, numbness or tingling. Patient is checking their feet every other day. Denies foot concerns. Last diabetic eye exam eye exam was in 03/2016, received a new script for eye glasses but feels his diabetes is affecting him more and his new eye glasses have not worked for him. Diet is generally non-compliant, works for [employer]. He has been seen by a nutritionist for diabetic education in 06/2016. Patient is not exercising. Denies alcohol use. Smokes 1/2ppd.  He has a current medication list which includes the following prescription(s): insulin detemir, insulin regular, lisinopril, metformin, pantoprazole, atorvastatin, one touch ultra mini, cyclobenzaprine, dicyclomine, fenofibrate, glucose blood, insulin syringe .5cc/30gx5/16", terazosin, and triamcinolone cream. He  has a past medical history of Diabetes mellitus (2011) and High triglycerides.   Objective:   PHYSICAL EXAM BP 111/70 (BP Location: Right Arm, Patient Position: Sitting, Cuff Size: Normal)   Pulse 63   Temp 98.2 F (36.8 C) (Oral)   Resp 16   Ht 5\' 5"  (1.651 m)   Wt 159 lb 3.2 oz (72.2 kg)   SpO2 98%   BMI 26.49 kg/m    Visual Acuity Screening   Right eye Left eye Both eyes  Without correction: 20/30 20/100 20/40  With correction:      Physical Exam  Constitutional: He is oriented to person, place, and time. He appears well-developed and well-nourished.  HENT:  Mouth/Throat: Oropharynx is clear and moist.  Eyes: EOM are normal.  No scleral icterus.  Right pupil not reactive to light. Fundus not discernable on exam.   Neck: Normal range of motion. Neck supple. No thyromegaly present.  Cardiovascular: Normal rate, regular rhythm and intact distal pulses.  Exam reveals no gallop and no friction rub.   No murmur heard. Pulmonary/Chest: No respiratory distress. He has no wheezes. He has no rales.  Abdominal: Soft. Bowel sounds are normal. He exhibits no distension and no mass. There is no tenderness. There is no guarding.  Musculoskeletal: Normal range of motion. He exhibits no edema or tenderness.  Neurological: He is alert and oriented to person, place, and time. He displays normal reflexes. No cranial nerve deficit. Coordination normal.  Negative Romberg and Pronator Drift.  Skin: Skin is warm and dry. Capillary refill takes less than 2 seconds.  Psychiatric: He has a normal mood and affect.   Diabetic Foot Exam - Simple   Simple Foot Form Visual Inspection No deformities, no ulcerations, no other skin breakdown bilaterally:  Yes Sensation Testing Intact to touch and monofilament testing bilaterally:  Yes Pulse Check Posterior Tibialis and Dorsalis pulse intact bilaterally:  Yes Comments    Results for orders placed or performed in visit on 07/13/16 (from the past 24 hour(s))  POCT glycosylated hemoglobin (Hb A1C)     Status: None   Collection Time: 07/13/16  8:45 AM  Result Value Ref Range   Hemoglobin A1C 11.2    Assessment and Plan :   This case was precepted with Dr. Clelia Croft.   1. Uncontrolled type 2 diabetes mellitus without complication, with long-term current use of  insulin (HCC) - Counseled on dietary modifications, start Metformin-sitagliptin twice daily. Increase meal time insulin to 2 units before each meal. Maintain long acting insulin. Recheck in 1 week.  - POCT glycosylated hemoglobin (Hb A1C) - Comprehensive metabolic panel - HM DIABETES FOOT EXAM - Insulin and C-Peptide - insulin regular  (HUMULIN R) 100 units/mL injection; Inject 0.02 mLs (2 Units total) into the skin 3 (three) times daily before meals.  Dispense: 10 mL; Refill: 11 - insulin detemir (LEVEMIR) 100 UNIT/ML injection; Inject 0.18 mLs (18 Units total) into the skin 2 (two) times daily.  Dispense: 10 mL; Refill: 11 - SitaGLIPtin-MetFORMIN HCl 50-1000 MG TB24; Take 1 tablet by mouth 2 (two) times daily with a meal.  Dispense: 180 tablet; Refill: 1 - Ambulatory referral to Ophthalmology  2. Mixed hyperlipidemia - Labs pending.  3. Abnormal pupil reflex 4. Blurred vision - Will refer urgently to Dr. Dione BoozeGroat or Dr. Nile RiggsShapiro today. - Ambulatory referral to Ophthalmology  5. Heartburn - pantoprazole (PROTONIX) 40 MG tablet; Take 1 tablet (40 mg total) by mouth daily.  Dispense: 90 tablet; Refill: 1   Wallis BambergMario Analie Katzman, PA-C [facility] Care at [facility] [phone] 07/13/2016 8:25 AM

## 2016-07-13 NOTE — Patient Instructions (Addendum)
[address], [place], [place] [address] Dr Dione BoozeGroat  Go now    IF you received an x-ray today, you will receive an invoice from [facility]. Please contact [facility] at [phone] with questions or concerns regarding your invoice.   IF you received labwork today, you will receive an invoice from [facility]. Please contact [facility] at [phone] with questions or concerns regarding your invoice.   Our billing staff will not be able to assist you with questions regarding bills from these companies.  You will be contacted with the lab results as soon as they are available. The fastest way to get your results is to activate your My Chart account. Instructions are located on the last page of this paperwork. If you have not heard from us regarding the results in 2 weeks, please contact this office.

## 2016-07-14 LAB — INSULIN AND C-PEPTIDE, SERUM
C PEPTIDE: 1.8 ng/mL (ref 1.1–4.4)
INSULIN: 5.8 u[IU]/mL (ref 2.6–24.9)

## 2016-07-18 ENCOUNTER — Telehealth: Payer: Self-pay

## 2016-07-18 NOTE — Telephone Encounter (Deleted)
PA started for Janumet via cover my meds, however, it kicked me out without completing form.  I called the ins co and started a PA.  It should be determined within 3-4 days. Pt's prior auth number is #[id number]. 

## 2016-07-20 ENCOUNTER — Ambulatory Visit (INDEPENDENT_AMBULATORY_CARE_PROVIDER_SITE_OTHER): Payer: 59 | Admitting: Emergency Medicine

## 2016-07-20 ENCOUNTER — Encounter: Payer: Self-pay | Admitting: Emergency Medicine

## 2016-07-20 VITALS — BP 114/75 | HR 62 | Temp 97.8°F | Resp 18 | Ht 66.14 in | Wt 159.2 lb

## 2016-07-20 DIAGNOSIS — E1165 Type 2 diabetes mellitus with hyperglycemia: Secondary | ICD-10-CM | POA: Diagnosis not present

## 2016-07-20 DIAGNOSIS — IMO0001 Reserved for inherently not codable concepts without codable children: Secondary | ICD-10-CM

## 2016-07-20 DIAGNOSIS — Z794 Long term (current) use of insulin: Secondary | ICD-10-CM | POA: Diagnosis not present

## 2016-07-20 LAB — GLUCOSE, POCT (MANUAL RESULT ENTRY): POC GLUCOSE: 207 mg/dL — AB (ref 70–99)

## 2016-07-20 NOTE — Progress Notes (Signed)
Joseph Roberts 56 y.o.   Chief Complaint  Patient presents with  . Follow-up    diabetes f/u    HISTORY OF PRESENT ILLNESS: This is a 56 y.o. male here for follow up of DM; has no complaints and feels fine. States he's taking Metformin and insulin before meals and also twice a day; seems to understand previous instructions on how to use insulin.  HPI   Prior to Admission medications   Medication Sig Start Date End Date Taking? Authorizing Provider  Blood Glucose Monitoring Suppl (ONE TOUCH ULTRA MINI) W/DEVICE KIT 1 each by Does not apply route daily. Test fasting daily   Yes [provider]  fenofibrate 160 MG tablet Take 1 tablet (160 mg total) by mouth daily. 03/09/16  Yes Scot Jun, FNP  glucose blood test strip 1 each by Other route as needed for other. Check CBG 3 times a day before meals 03/08/16  Yes Abrol, Ascencion Dike, MD  insulin detemir (LEVEMIR) 100 UNIT/ML injection Inject 0.18 mLs (18 Units total) into the skin 2 (two) times daily. 07/13/16  Yes Jaynee Eagles, PA-C  insulin regular (HUMULIN R) 100 units/mL injection Inject 0.02 mLs (2 Units total) into the skin 3 (three) times daily before meals. 07/13/16  Yes Jaynee Eagles, PA-C  Insulin Syringe-Needle U-100 (INSULIN SYRINGE .5CC/30GX5/16") 30G X 5/16" 0.5 ML MISC Administer insulin as instructed 03/08/16  Yes Reyne Dumas, MD  lisinopril (PRINIVIL,ZESTRIL) 5 MG tablet Take 1 tablet (5 mg total) by mouth daily. 03/09/16  Yes Scot Jun, FNP  pantoprazole (PROTONIX) 40 MG tablet Take 1 tablet (40 mg total) by mouth daily. 07/13/16  Yes Jaynee Eagles, PA-C  SitaGLIPtin-MetFORMIN HCl 50-1000 MG TB24 Take 1 tablet by mouth 2 (two) times daily with a meal. 07/13/16  Yes Jaynee Eagles, PA-C  terazosin (HYTRIN) 1 MG capsule Take 1 mg by mouth daily. 02/25/16  Yes [provider]  atorvastatin (LIPITOR) 40 MG tablet Take 1 tablet (40 mg total) by mouth daily. Patient not taking: Reported on 07/13/2016 10/17/15   Mancel Bale, PA-C  cyclobenzaprine (FLEXERIL) 10 MG tablet Take 1 tablet (10 mg total) by mouth 3 (three) times daily as needed for muscle spasms. Patient not taking: Reported on 07/13/2016 06/14/14   Roselee Culver, MD  dicyclomine (BENTYL) 10 MG capsule Take 1 pill 3 times daily if needed for abdominal pain Patient not taking: Reported on 03/09/2016 07/11/15   Posey Boyer, MD    No Known Allergies  Patient Active Problem List   Diagnosis Date Noted  . Essential hypertension 03/06/2016  . Acute kidney injury ([place]) 03/06/2016  . Urinary retention 03/06/2016  . Uncontrolled type 2 diabetes mellitus with complication ([place])   . HHNC (hyperglycemic hyperosmolar nonketotic coma) ([place])   . Dyslipidemia   . Hyperglycemia 03/05/2016  . Type 2 diabetes mellitus with hyperglycemia ([place]) 03/05/2016  . 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 Dyslipidemia with low high density lipoprotein (HDL) cholesterol with hypertriglyceridemia due to type 2 diabetes mellitus ([place]) 11/03/2013  . Gall bladder polyp 10/24/2013  . GERD (gastroesophageal reflux disease) 10/24/2013  . Diabetes type 2, controlled ([place]) 10/23/2013    Past Medical History:  Diagnosis Date  . Diabetes mellitus 2011  . High triglycerides     Past Surgical History:  Procedure Laterality Date  . COLONOSCOPY N/A 10/24/2013   Procedure: COLONOSCOPY;  Surgeon: Ladene Artist, MD;  Location: [facility] ENDOSCOPY;  Service: Endoscopy;  Laterality: N/A;. diverticulosis, mild, small internal hemorrhoids  Social History   Social History  . Marital status: Married    Spouse name: N/A  . Number of children: N/A  . Years of education: N/A   Occupational History  . Not on file.   Social History Main Topics  . Smoking status: Current Every Day Smoker    Packs/day: 0.50    Years: 30.00    Types: Cigarettes  . Smokeless tobacco: Never Used  . Alcohol use No  . Drug use: No  . Sexual activity: Not on file   Other Topics Concern  . Not on  file   Social History Narrative  . No narrative on file    Family History  Problem Relation Age of Onset  . Diabetes Mother   . Diabetes Sister   . Diabetes Brother      Review of Systems  Constitutional: Negative.  Negative for chills, fever and malaise/fatigue.  HENT: Negative.  Negative for congestion, nosebleeds and sore throat.   Eyes: Negative.  Negative for blurred vision and double vision.  Respiratory: Negative.  Negative for cough and shortness of breath.   Cardiovascular: Negative.  Negative for chest pain, palpitations and leg swelling.  Gastrointestinal: Negative.  Negative for abdominal pain, diarrhea, nausea and vomiting.  Genitourinary: Negative.  Negative for dysuria and hematuria.  Musculoskeletal: Negative.  Negative for back pain, myalgias and neck pain.  Skin: Negative.  Negative for rash.  Neurological: Negative.  Negative for dizziness, sensory change, focal weakness and headaches.  Endo/Heme/Allergies: Negative.   All other systems reviewed and are negative.  Vitals:   07/20/16 1002  BP: 114/75  Pulse: 62  Resp: 18  Temp: 97.8 F (36.6 C)     Physical Exam  Constitutional: He is oriented to person, place, and time. He appears well-developed and well-nourished.  HENT:  Head: Normocephalic and atraumatic.  Nose: Nose normal.  Mouth/Throat: Oropharynx is clear and moist. No oropharyngeal exudate.  Eyes: Conjunctivae and EOM are normal. Pupils are equal, round, and reactive to light.  Neck: Normal range of motion. Neck supple. No JVD present. No thyromegaly present.  Cardiovascular: Normal rate, regular rhythm, normal heart sounds and intact distal pulses.   Pulmonary/Chest: Effort normal and breath sounds normal.  Abdominal: Soft. He exhibits no distension. There is no tenderness.  Musculoskeletal: Normal range of motion.  Lymphadenopathy:    He has no cervical adenopathy.  Neurological: He is alert and oriented to person, place, and time. No  sensory deficit. He exhibits normal muscle tone.  Skin: Skin is warm and dry. Capillary refill takes less than 2 seconds. No rash noted.  Psychiatric: He has a normal mood and affect. His behavior is normal.  Vitals reviewed.    ASSESSMENT & PLAN: 1. Uncontrolled type 2 diabetes mellitus without complication, with long-term current use of insulin ([place]) - Counseled on dietary modifications, continue Metformin; as instructed before, increase meal time insulin to 2 units before each meal. Maintain long acting insulin. Prescribed last visit: - insulin regular (HUMULIN R) 100 units/mL injection; Inject 0.02 mLs (2 Units total) into the skin 3 (three) times daily before meals.  Dispense: 10 mL; Refill: 11 - insulin detemir (LEVEMIR) 100 UNIT/ML injection; Inject 0.18 mLs (18 Units total) into the skin 2 (two) times daily.  Dispense: 10 mL; Refill: 11 - SitaGLIPtin-MetFORMIN HCl 50-1000 MG TB24; needs PA. Patient states he's got plenty of medication. Referred to Endo as well. Has appointment with Ophthalmology today.   Patient Instructions  IF you received an x-ray today, you will receive an invoice from [facility]. Please contact [facility] at [phone] with questions or concerns regarding your invoice.   IF you received labwork today, you will receive an invoice from [facility]. Please contact [facility] at [phone] with questions or concerns regarding your invoice.   Our billing staff will not be able to assist you with questions regarding bills from these companies.  You will be contacted with the lab results as soon as they are available. The fastest way to get your results is to activate your My Chart account. Instructions are located on the last page of this paperwork. If you have not heard from [place] regarding the results in 2 weeks, please contact this office.      Diagnstico de la diabetes mellitus tipo2 en los adultos (Type 2 Diabetes Mellitus,  Diagnosis, Adult) La diabetes tipo2 (diabetes mellitus tipo2) es una enfermedad de larga duracin (crnica). Puede deberse a uno de [employer] o a ambos:  El cuerpo no produce la cantidad suficiente de una hormona llamada insulina.  El cuerpo no reacciona de forma normal a la insulina que produce. La insulina permite que los ciertos azcares (glucosa) ingresen a las clulas del cuerpo. Esto le proporciona la energa. Cuando se tiene diabetes tipo2, la glucosa no pueden ingresar a las clulas. Esto produce un aumento del nivel de glucosa en la sangre (hiperglucemia). El mdico fijar los objetivos del tratamiento para usted. Generalmente, los resultados de los niveles de glucosa en la sangre deben ser los siguientes:  Antes de las comidas (preprandial): de 80 a 182m/dl (4,4 a 7,217ml/l).  Despus de las comidas (posprandial): por debajo de 1809ml (29m46ml).  Nivel deA1c (hemoglobinaA1c): menos del7%. CUIDADOS EN EL HOGAR Preguntas para hacerle al mdico Puede hacer las siguientes preguntas:  Debo reunirme con un iRadio broadcast assistanta el cuidado de la diabetes?  Dnde puedo encontrar un grupo de apoyo para personas diabticas?  Qu equipos necesitar para cuidarme en casa?  Qu medicamentos para la diabetes necesito? Cundo debo tomarlos?  Con qu frecuencia debo controlarme el nivel de glucosa en la sangre?  A qu nmero puedo llamar si tengo preguntas?  Cundo es la prxima cita con el mdico? Instrucciones generales  TomeSCANA Corporationicamentos de venta libre y los recetados solamente como se lo haya indicado el mdico.  ConcConsulting civil engineerodas las visitas de control como se lo haya indicado el mdico. Esto es importante. SOLICITE AYUDA SI:  El nivel de glucosa en la sangre es mayor o igual que 240mg67m(13,3mmol60m) durante 2das seguidos.  Ha estado enfermo o ha tenido fiebre durante 2o ms das y no mejoraEmerald Mountaintiene alguno de estos [employer] ms de  6horas:  No puede comer ni beber.  Siente malestar estomacal (nuseas).  Vomita.  La materia fecal es lquida (diarrea). SOLICITE AYUDA DE INMEDIATO SI:  El nivel de glucosa en la sangre est por debajo de 54mg/d23mmmol/l62m Est confundido.  Tiene dificultad para hacer lo siguiente:  Pensar con claridad.  Respirar.  Tiene niveles moderados o altos de cetonas en la orina. ETomas de Castronformacin no tiene como finMarine scientistejo del mdico. Asegrese de hacerle al mdico cualquier pregunta que tenga. Document Released: 05/18/2008 Document Revised: 06/13/2015 Document Reviewed: 03/25/2015 Elsevier Interactive Patient Education  2017 Elsevier Inc.     Jamilynn Whitacre SAgustina [facility] [place]

## 2016-07-20 NOTE — Patient Instructions (Addendum)
     IF you received an x-ray today, you will receive an invoice from [facility]. Please contact [facility] at [phone] with questions or concerns regarding your invoice.   IF you received labwork today, you will receive an invoice from [facility]. Please contact [facility] at [phone] with questions or concerns regarding your invoice.   Our billing staff will not be able to assist you with questions regarding bills from these companies.  You will be contacted with the lab results as soon as they are available. The fastest way to get your results is to activate your My Chart account. Instructions are located on the last page of this paperwork. If you have not heard from us regarding the results in 2 weeks, please contact this office.      Diagnstico de la diabetes mellitus tipo2 en los adultos (Type 2 Diabetes Mellitus, Diagnosis, Adult) La diabetes tipo2 (diabetes mellitus tipo2) es una enfermedad de larga duracin (crnica). Puede deberse a uno de estos problemas o a ambos:  El cuerpo no produce la cantidad suficiente de una hormona llamada insulina.  El cuerpo no reacciona de forma normal a la insulina que produce. La insulina permite que los ciertos azcares (glucosa) ingresen a las clulas del cuerpo. Esto le proporciona la energa. Cuando se tiene diabetes tipo2, la glucosa no pueden ingresar a las clulas. Esto produce un aumento del nivel de glucosa en la sangre (hiperglucemia). El mdico fijar los objetivos del tratamiento para usted. Generalmente, los resultados de los niveles de glucosa en la sangre deben ser los siguientes:  Antes de las comidas (preprandial): de 80 a 130mg/dl (4,4 a 7,2mmol/l).  Despus de las comidas (posprandial): por debajo de 180mg/dl (10mmol/l).  Nivel deA1c (hemoglobinaA1c): menos del7%. CUIDADOS EN EL HOGAR Preguntas para hacerle al mdico Puede hacer las siguientes preguntas:  Debo reunirme con un instructor  para el cuidado de la diabetes?  Dnde puedo encontrar un grupo de apoyo para personas diabticas?  Qu equipos necesitar para cuidarme en casa?  Qu medicamentos para la diabetes necesito? Cundo debo tomarlos?  Con qu frecuencia debo controlarme el nivel de glucosa en la sangre?  A qu nmero puedo llamar si tengo preguntas?  Cundo es la prxima cita con el mdico? Instrucciones generales  Tome los medicamentos de venta libre y los recetados solamente como se lo haya indicado el mdico.  Concurra a todas las visitas de control como se lo haya indicado el mdico. Esto es importante. SOLICITE AYUDA SI:  El nivel de glucosa en la sangre es mayor o igual que 240mg/dl (13,3mmol/dl) durante 2das seguidos.  Ha estado enfermo o ha tenido fiebre durante 2o ms das y no mejora.  Si tiene alguno de estos problemas durante ms de 6horas:  No puede comer ni beber.  Siente malestar estomacal (nuseas).  Vomita.  La materia fecal es lquida (diarrea). SOLICITE AYUDA DE INMEDIATO SI:  El nivel de glucosa en la sangre est por debajo de 54mg/dl (3mmol/l).  Est confundido.  Tiene dificultad para hacer lo siguiente:  Pensar con claridad.  Respirar.  Tiene niveles moderados o altos de cetonas en la orina. Esta informacin no tiene como fin reemplazar el consejo del mdico. Asegrese de hacerle al mdico cualquier pregunta que tenga. Document Released: 05/18/2008 Document Revised: 06/13/2015 Document Reviewed: 03/25/2015 Elsevier Interactive Patient Education  2017 Elsevier Inc.  

## 2016-07-25 NOTE — Telephone Encounter (Signed)
Called [employer] to check on status.  It was still under review. Will check back in a couple days.

## 2016-07-25 NOTE — Telephone Encounter (Signed)
PA started for Janumet via cover my meds, however, it kicked me out without completing form.  I called the ins co and started a PA.  It should be determined within 3-4 days. Pt's prior auth number is #[id number].

## 2016-07-25 NOTE — Telephone Encounter (Deleted)
07-18-16 filed request with cover my meds.  Checked back today and it was still not ready.  Resubmitted claim. Awaiting response in 3-4 days.  If it is not ready at that time, I will call insurance to try and approve.

## 2016-08-01 NOTE — Telephone Encounter (Signed)
janumet denied  Must try x 3 months and fail jentadueto

## 2016-08-02 MED ORDER — LINAGLIPTIN-METFORMIN HCL 2.5-1000 MG PO TABS: 1.0000 | ORAL_TABLET | Freq: Two times a day (BID) | ORAL | 1 refills | Status: DC

## 2016-08-02 NOTE — Telephone Encounter (Signed)
Script sent for Monsanto CompanyJentadueto. Patient is to take this twice daily.

## 2016-10-19 ENCOUNTER — Encounter: Payer: Self-pay | Admitting: Emergency Medicine

## 2016-10-19 ENCOUNTER — Ambulatory Visit (INDEPENDENT_AMBULATORY_CARE_PROVIDER_SITE_OTHER): Payer: 59 | Admitting: Emergency Medicine

## 2016-10-19 ENCOUNTER — Other Ambulatory Visit: Payer: Self-pay | Admitting: Emergency Medicine

## 2016-10-19 VITALS — BP 112/70 | HR 90 | Temp 97.9°F | Resp 16 | Ht 65.0 in | Wt 147.4 lb

## 2016-10-19 DIAGNOSIS — Z716 Tobacco abuse counseling: Secondary | ICD-10-CM | POA: Diagnosis not present

## 2016-10-19 DIAGNOSIS — R739 Hyperglycemia, unspecified: Secondary | ICD-10-CM | POA: Diagnosis not present

## 2016-10-19 DIAGNOSIS — R3915 Urgency of urination: Secondary | ICD-10-CM | POA: Diagnosis not present

## 2016-10-19 DIAGNOSIS — F17209 Nicotine dependence, unspecified, with unspecified nicotine-induced disorders: Secondary | ICD-10-CM

## 2016-10-19 DIAGNOSIS — R059 Cough, unspecified: Secondary | ICD-10-CM

## 2016-10-19 DIAGNOSIS — E1165 Type 2 diabetes mellitus with hyperglycemia: Secondary | ICD-10-CM

## 2016-10-19 DIAGNOSIS — J069 Acute upper respiratory infection, unspecified: Secondary | ICD-10-CM

## 2016-10-19 DIAGNOSIS — R05 Cough: Secondary | ICD-10-CM | POA: Diagnosis not present

## 2016-10-19 DIAGNOSIS — IMO0001 Reserved for inherently not codable concepts without codable children: Secondary | ICD-10-CM

## 2016-10-19 DIAGNOSIS — Z794 Long term (current) use of insulin: Secondary | ICD-10-CM | POA: Diagnosis not present

## 2016-10-19 LAB — POCT URINALYSIS DIP (MANUAL ENTRY)
Bilirubin, UA: NEGATIVE
Blood, UA: NEGATIVE
Glucose, UA: 500 mg/dL — AB
Ketones, POC UA: NEGATIVE mg/dL
LEUKOCYTES UA: NEGATIVE
NITRITE UA: NEGATIVE
PH UA: 5 (ref 5.0–8.0)
Spec Grav, UA: 1.02 (ref 1.010–1.025)
UROBILINOGEN UA: 0.2 U/dL

## 2016-10-19 LAB — POCT GLYCOSYLATED HEMOGLOBIN (HGB A1C): Hemoglobin A1C: 12.7

## 2016-10-19 MED ORDER — AZITHROMYCIN 250 MG PO TABS: ORAL_TABLET | ORAL | 0 refills | Status: DC

## 2016-10-19 MED ORDER — SITAGLIP PHOS-METFORMIN HCL ER 50-1000 MG PO TB24: 1.0000 | ORAL_TABLET | Freq: Two times a day (BID) | ORAL | 1 refills | Status: DC

## 2016-10-19 MED ORDER — BUPROPION HCL ER (SR) 150 MG PO TB12: 150.0000 mg | ORAL_TABLET | Freq: Every day | ORAL | 3 refills | Status: DC

## 2016-10-19 NOTE — Patient Instructions (Addendum)
Aumente insulina Levemir a 20 unidades dos veces al dia. Use insulina regular 2 unidades con cada comida (tres veces al dia). Continue tomando SitGliptin-Metformin dos veces al dia.    IF you received an x-ray today, you will receive an invoice from [facility]. Please contact [facility] at [phone] with questions or concerns regarding your invoice.   IF you received labwork today, you will receive an invoice from [facility]. Please contact [facility] at [phone] with questions or concerns regarding your invoice.   Our billing staff will not be able to assist you with questions regarding bills from these companies.  You will be contacted with the lab results as soon as they are available. The fastest way to get your results is to activate your My Chart account. Instructions are located on the last page of this paperwork. If you have not heard from [place] regarding the results in 2 weeks, please contact this office.     Diagnstico de la diabetes mellitus tipo2 en los adultos (Type 2 Diabetes Mellitus, Diagnosis, Adult) La diabetes tipo2 (diabetes mellitus tipo2) es una enfermedad de larga duracin (crnica). Puede deberse a uno de [employer] o a ambos:  El cuerpo no produce la cantidad suficiente de una hormona llamada insulina.  El cuerpo no reacciona de forma normal a la insulina que produce. La insulina permite que los ciertos azcares (glucosa) ingresen a las clulas del cuerpo. Esto le proporciona la energa. Cuando se tiene diabetes tipo2, la glucosa no pueden ingresar a las clulas. Esto produce un aumento del nivel de glucosa en la sangre (hiperglucemia). El mdico fijar los objetivos del tratamiento para usted. Generalmente, los resultados de los niveles de glucosa en la sangre deben ser los siguientes:  Antes de las comidas (preprandial): de 80 a 130mg /dl (4,4 a 7,6LYYT/K).  Despus de las comidas (posprandial): por debajo de 180mg /dl  (35WSFK/C).  Nivel deA1c (hemoglobinaA1c): menos del7%. CUIDADOS EN EL HOGAR Preguntas para hacerle al mdico Puede hacer las siguientes preguntas:  Debo reunirme con IT trainer para el cuidado de la diabetes?  Dnde puedo encontrar un grupo de apoyo para personas diabticas?  Qu equipos necesitar para cuidarme en casa?  Qu medicamentos para la diabetes necesito? Cundo debo tomarlos?  Con qu frecuencia debo controlarme el nivel de glucosa en la sangre?  A qu nmero puedo llamar si tengo preguntas?  Cundo es la prxima cita con el mdico? Instrucciones generales  [employer] medicamentos de venta libre y los recetados solamente como se lo haya indicado el mdico.  Oceanographer a todas las visitas de control como se lo haya indicado el mdico. Esto es importante. SOLICITE AYUDA SI:  El nivel de glucosa en la sangre es mayor o igual que 240mg /dl (12,7NTZG/YF) durante 2das seguidos.  Ha estado enfermo o ha tenido fiebre durante 2o ms [address]  [address] y no [place].  Si tiene alguno de estos problemas durante ms de 6horas: ? No puede comer ni beber. ? Siente malestar estomacal (nuseas). ? Vomita. ? La materia fecal es lquida (diarrea). SOLICITE AYUDA DE INMEDIATO SI:  El nivel de glucosa en la sangre est por debajo de 54mg /dl (71mmol/l).  Est confundido.  Tiene dificultad para hacer lo siguiente: ? Pensar con claridad. ? Respirar.  Tiene niveles moderados o altos de cetonas en la [employer]. Esta informacin no tiene Theme park manager el consejo del mdico. Asegrese de hacerle al mdico cualquier pregunta que tenga. Document Released: 05/18/2008 Document Revised: 06/13/2015 Document Reviewed: 03/25/2015 Elsevier Interactive Patient Education  2018 Elsevier  Inc.  Control del nivel sanguneo de glucosa en los adultos (Blood Glucose Monitoring, Adult) El control del nivel de azcar (glucosa) en la sangre lo ayuda a tener la diabetes bajo control. Tambin  ayuda a que usted y el mdico controlen si el tratamiento de la diabetes es Engineer, manufacturing. El control del nivel sanguneo de glucosa implica realizar controles regulares como lo indique el mdico y Midwife registro de los resultados (registro diario). POR QU DEBO CONTROLAR EL NIVEL SANGUNEO DE GLUCOSA? Si controla su nivel sanguneo de glucosa con regularidad, podr:  Comprender de [employer], la actividad fsica, las enfermedades y los medicamentos inciden en los niveles sanguneos de [place].  Conocer el nivel sanguneo de glucosa en cualquier momento dado. Saber rpidamente si el nivel es bajo (hipoglucemia) o alto (hiperglucemia).  Puede ser de ayuda para que usted y el mdico sepan cmo ajustar los medicamentos. CUNDO DEBO CONTROLAR EL NIVEL SANGUNEO DE GLUCOSA? Siga las indicaciones del mdico acerca de la frecuencia con la que debe controlar el nivel sanguneo de glucosa. La frecuencia puede depender de:  El tipo de diabetes que tenga.  Si su diabetes est bajo control.  Los medicamentos que toma. Si usted tiene diabetes tipo 1:  Controle su nivel sanguneo de glucosa al [facility] al da.  Tambin controle su nivel sanguneo de glucosa: ? Antes de cada inyeccin de insulina. ? Antes y despus de hacer ejercicio. ? Entre las comidas. ? Dos horas despus de una comida. ? Ocasionalmente, entre las 2:00a.m. y las 3:00a.m., como se lo hayan indicado. ? Antes de Education officer, environmental tareas peligrosas, Sport and exercise psychologist o usar maquinaria pesada. ? A la hora de acostarse.  Es posible que Chemical engineer con ms frecuencia los niveles sanguneos de [place], [place] 6 a 10veces por da: ? Si [place] una bomba de [place]. ? Si necesita varias inyecciones diarias. ? Si su diabetes no est bien controlada. ? Si est enfermo. ? Si tiene antecedentes de hipoglucemia grave. ? Si tiene antecedentes de no darse cuenta cundo est bajando su nivel sanguneo de glucosa (hipoglucemia  asintomtica). Si usted tiene diabetes tipo 2:  Si recibe insulina u otro medicamento para la diabetes, controle el nivel sanguneo de glucosa al [employer] veces al da.  Mientras reciba tratamiento intensivo con insulina, debe medirse el nivel sanguneo de glucosa al menos 4veces al [employer]. Ocasionalmente, es posible que deba controlarse entre las 2:00a.m. y las 3:00a.m., segn se lo indiquen.  Tambin controle su nivel sanguneo de glucosa: ? Antes y despus de hacer ejercicio. ? Antes de Education officer, environmental tareas peligrosas, Sport and exercise psychologist o usar maquinaria pesada.  Es posible que Chemical engineer con ms frecuencia los niveles sanguneos de glucosa si: ? Es necesario ajustar la dosis de sus medicamentos. ? Su diabetes no est bien controlada. ? Est enfermo. QU ES UN REGISTRO DIARIO DEL NIVEL [place] DE GLUCOSA?  Un registro diario es un registro de los valores de glucosa en la [place]. Puede ayudarles a usted y a su mdico a: ? Conservator, museum/gallery en su nivel sanguneo de glucosa durante el transcurso del [place]. ? Ajustar su plan de control de la diabetes como sea necesario.  Cada vez que controle su nivel sanguneo de glucosa, anote el resultado y aquellos factores que pueden estar afectando su nivel sanguneo de glucosa, como la dieta y la actividad fsica Dietitian.  La [employer] de los medidores de glucosa guardan un registro de las lecturas realizadas con el medidor. Algunos permiten descargar  sus registros en una computadora.  CMO ME CONTROLO EL NIVEL SANGUNEO DE GLUCOSA? Siga los siguientes pasos para obtener lecturas precisas de su glucemia: Materiales necesarios  Medidor de Horticulturist, commercial.  Tiras reactivas para el medidor. Cada medidor tiene sus propias tiras reactivas. Debe usar las tiras reactivas que trae su medidor.  Una aguja para pincharse el dedo (lanceta). No utilice la misma lanceta en ms de una ocasin.  Un dispositivo que sujeta la lanceta  (dispositivo de puncin).  Un diario o libro de anotaciones para [employer]. Procedimiento  [employer] con agua y [place].  Pnchese el costado del dedo (no la punta) con Optometrist. Use un dedo diferente cada vez.  Frote suavemente el dedo [employer] aparezca una pequea gota de [place].  Siga las instrucciones que vienen con el medidor para Public affairs consultant tira Firefighter, Contractor la sangre sobre la tira y usar el medidor de Horticulturist, commercial.  Registre el resultado y las observaciones que desee. Zonas del cuerpo alternativas para realizar las pruebas  Algunos medidores le permiten tomar sangre para la prueba de otras zonas del cuerpo que no son el dedo (zonas alternativas).  Si cree que tiene hipoglucemia o si tiene hipoglucemia asintomtica, no utilice las zonas alternativas del cuerpo. En su lugar, use los dedos.  Es posible que las zonas alternativas no sean tan precisas como los dedos porque el flujo de sangre es ms lento en esas zonas. Esto significa que el resultado que obtiene de estas zonas puede estar retrasado y ser un poco diferente del resultado que obtendra del dedo.  Los sitios alternativos ms comunes son los siguientes: ? Los antebrazos. ? Los muslos. ? La palma de [employer]. Consejos adicionales  Siempre tenga los insumos a mano.  Todos los medidores de glucosa incluyen un nmero de telfono "directo", disponible las 24 horas, al que podr llamar si tiene preguntas o [place]. Tambin puede consultar a su mdico.  Despus de usar algunas cajas de tiras reactivas, ajuste (calibre) el medidor de glucemia segn las instrucciones del medidor. Esta informacin no tiene Theme park manager el consejo del mdico. Asegrese de hacerle al mdico cualquier pregunta que tenga. Document Released: 02/19/2005 Document Revised: 06/13/2015 Document Reviewed: 08/01/2015 Elsevier Interactive Patient Education  2017 [employer].

## 2016-10-19 NOTE — Assessment & Plan Note (Signed)
Chronic in nature. Needs urology evaluation.

## 2016-10-19 NOTE — Progress Notes (Signed)
Joseph Roberts 56 y.o.   Chief Complaint  Patient presents with  . Follow-up    3 months diabetes  . Cough    x 2 weeks productive with green mucus    HISTORY OF PRESENT ILLNESS: This is a 56 y.o. male here for DM follow up; also c/o 2 week h/o productive cough; wife just passed away in [place] last night (pancreas CA)HPI Smoker, wants to quit. Erratic insulin use.  Prior to Admission medications   Medication Sig Start Date End Date Taking? Authorizing Provider  atorvastatin (LIPITOR) 40 MG tablet Take 1 tablet (40 mg total) by mouth daily. 10/17/15  Yes Weber, Sarah L, PA-C  cyclobenzaprine (FLEXERIL) 10 MG tablet Take 1 tablet (10 mg total) by mouth 3 (three) times daily as needed for muscle spasms. 06/14/14  Yes Roselee Culver, MD  fenofibrate 160 MG tablet Take 1 tablet (160 mg total) by mouth daily. 03/09/16  Yes Scot Jun, FNP  insulin detemir (LEVEMIR) 100 UNIT/ML injection Inject 0.18 mLs (18 Units total) into the skin 2 (two) times daily. 07/13/16  Yes Jaynee Eagles, PA-C  insulin regular (HUMULIN R) 100 units/mL injection Inject 0.02 mLs (2 Units total) into the skin 3 (three) times daily before meals. 07/13/16  Yes Jaynee Eagles, PA-C  lisinopril (PRINIVIL,ZESTRIL) 5 MG tablet Take 1 tablet (5 mg total) by mouth daily. 03/09/16  Yes Scot Jun, FNP  pantoprazole (PROTONIX) 40 MG tablet Take 1 tablet (40 mg total) by mouth daily. 07/13/16  Yes Jaynee Eagles, PA-C  SitaGLIPtin-MetFORMIN HCl 50-1000 MG TB24 Take 1 tablet by mouth 2 (two) times daily with a meal. 07/13/16  Yes Jaynee Eagles, PA-C  Blood Glucose Monitoring Suppl (ONE TOUCH ULTRA MINI) W/DEVICE KIT 1 each by Does not apply route daily. Test fasting daily    [provider]  dicyclomine (BENTYL) 10 MG capsule Take 1 pill 3 times daily if needed for abdominal pain Patient not taking: Reported on 03/09/2016 07/11/15   Posey Boyer, MD  glucose blood test strip 1 each by Other route as needed for other. Check  CBG 3 times a day before meals 03/08/16   Reyne Dumas, MD  Insulin Syringe-Needle U-100 (INSULIN SYRINGE .5CC/30GX5/16") 30G X 5/16" 0.5 ML MISC Administer insulin as instructed 03/08/16   Reyne Dumas, MD  Linagliptin-Metformin HCl 2.07-998 MG TABS Take 1 tablet by mouth 2 (two) times daily with a meal. 08/02/16   Jaynee Eagles, PA-C  terazosin (HYTRIN) 1 MG capsule Take 1 mg by mouth daily. 02/25/16   [provider]    No Known Allergies  Patient Active Problem List   Diagnosis Date Noted  . Essential hypertension 03/06/2016  . Acute kidney injury ([place]) 03/06/2016  . Urinary retention 03/06/2016  . Uncontrolled type 2 diabetes mellitus with complication ([place])   . HHNC (hyperglycemic hyperosmolar nonketotic coma) ([place])   . Dyslipidemia   . Hyperglycemia 03/05/2016  . Type 2 diabetes mellitus with hyperglycemia ([place]) 03/05/2016  . 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 Dyslipidemia with low high density lipoprotein (HDL) cholesterol with hypertriglyceridemia due to type 2 diabetes mellitus ([place]) 11/03/2013  . Gall bladder polyp 10/24/2013  . GERD (gastroesophageal reflux disease) 10/24/2013  . Diabetes type 2, controlled ([place]) 10/23/2013    Past Medical History:  Diagnosis Date  . Diabetes mellitus 2011  . High triglycerides     Past Surgical History:  Procedure Laterality Date  . COLONOSCOPY N/A 10/24/2013   Procedure: COLONOSCOPY;  Surgeon: Ladene Artist,  MD;  Location: [facility];  Service: Endoscopy;  Laterality: N/A;. diverticulosis, mild, small internal hemorrhoids    Social History   Social History  . Marital status: Married    Spouse name: N/A  . Number of children: N/A  . Years of education: N/A   Occupational History  . Not on file.   Social History Main Topics  . Smoking status: Current Every Day Smoker    Packs/day: 0.50    Years: 30.00    Types: Cigarettes  . Smokeless tobacco: Never Used  . Alcohol use No  . Drug use: No  . Sexual activity: Not  on file   Other Topics Concern  . Not on file   Social History Narrative  . No narrative on file    Family History  Problem Relation Age of Onset  . Diabetes Mother   . Diabetes Sister   . Diabetes Brother      Review of Systems  Constitutional: Negative for chills, fever and weight loss.  HENT: Negative.   Eyes: Negative.   Respiratory: Positive for cough. Negative for shortness of breath and wheezing.   Cardiovascular: Negative for chest pain, palpitations and leg swelling.  Gastrointestinal: Negative for abdominal pain, diarrhea, nausea and vomiting.  Genitourinary: Positive for urgency.       Nocturia  Musculoskeletal: Negative for myalgias and neck pain.  Skin: Negative for rash.  Neurological: Negative for dizziness and headaches.  Endo/Heme/Allergies: Negative.   Psychiatric/Behavioral: Positive for depression (wife just passed away).  All other systems reviewed and are negative.  Vitals:   10/19/16 0958  BP: 112/70  Pulse: 90  Resp: 16  Temp: 97.9 F (36.6 C)  SpO2: 97%     Physical Exam  Constitutional: He is oriented to person, place, and time. He appears well-developed and well-nourished.  HENT:  Head: Normocephalic and atraumatic.  Right Ear: External ear normal.  Left Ear: External ear normal.  Nose: Nose normal.  Mouth/Throat: Oropharynx is clear and moist.  Eyes: Pupils are equal, round, and reactive to light. Conjunctivae and EOM are normal.  Neck: Normal range of motion. Neck supple. No JVD present.  Cardiovascular: Normal rate, regular rhythm, normal heart sounds and intact distal pulses.   Pulmonary/Chest: Effort normal and breath sounds normal.  Abdominal: Soft. Bowel sounds are normal. He exhibits no distension. There is no tenderness.  Musculoskeletal: Normal range of motion.  Lymphadenopathy:    He has no cervical adenopathy.  Neurological: He is alert and oriented to person, place, and time. No sensory deficit. He exhibits normal  muscle tone.  Skin: Skin is warm and dry. Capillary refill takes less than 2 seconds. No rash noted.  Psychiatric: He has a normal mood and affect. His behavior is normal.  Vitals reviewed.  Uncontrolled type 2 diabetes mellitus without complication, with long-term current use of insulin (HCC) A1C still very high; pt's compliance in question; review of insulin regimen shows Levemir 18 units bid and Novolin R 2 units with meals bid; not taking insulin with lunch. Advised to increase Levemir to 20 units bid and to make sure he takes regular insulin with every meal including lunch. Has respiratory infection which may be worsening his hyperglycemia.  Urinary urgency Chronic in nature. Needs urology evaluation.    ASSESSMENT & PLAN: Donivan was seen today for follow-up and cough.  Diagnoses and all orders for this visit:  Hyperglycemia -     POCT glycosylated hemoglobin (Hb A1C)  Urinary urgency -  Urine Culture -     POCT urinalysis dipstick -     Ambulatory referral to Urology  Uncontrolled type 2 diabetes mellitus without complication, with long-term current use of insulin (HCC) -     SitaGLIPtin-MetFORMIN HCl 50-1000 MG TB24; Take 1 tablet by mouth 2 (two) times daily with a meal.  Cough  Acute upper respiratory infection -     azithromycin (ZITHROMAX) 250 MG tablet; Sig as indicated  Tobacco use disorder, continuous -     Ambulatory referral to Smoking Cessation Program -     buPROPion (WELLBUTRIN SR) 150 MG 12 hr tablet; Take 1 tablet (150 mg total) by mouth daily.  Tobacco abuse counseling -     Ambulatory referral to Smoking Cessation Program -     buPROPion (WELLBUTRIN SR) 150 MG 12 hr tablet; Take 1 tablet (150 mg total) by mouth daily.    Patient Instructions   Aumente insulina Levemir a 20 unidades dos veces al dia. Use insulina regular 2 unidades con cada comida (tres veces al dia). Continue tomando SitGliptin-Metformin dos veces al dia.    IF you received  an x-ray today, you will receive an invoice from [facility]. Please contact [facility] at [phone] with questions or concerns regarding your invoice.   IF you received labwork today, you will receive an invoice from [facility]. Please contact [facility] at [phone] with questions or concerns regarding your invoice.   Our billing staff will not be able to assist you with questions regarding bills from these companies.  You will be contacted with the lab results as soon as they are available. The fastest way to get your results is to activate your My Chart account. Instructions are located on the last page of this paperwork. If you have not heard from [place] regarding the results in 2 weeks, please contact this office.     Diagnstico de la diabetes mellitus tipo2 en los adultos (Type 2 Diabetes Mellitus, Diagnosis, Adult) La diabetes tipo2 (diabetes mellitus tipo2) es una enfermedad de larga duracin (crnica). Puede deberse a uno de [employer] o a ambos:  El cuerpo no produce la cantidad suficiente de una hormona llamada insulina.  El cuerpo no reacciona de forma normal a la insulina que produce. La insulina permite que los ciertos azcares (glucosa) ingresen a las clulas del cuerpo. Esto le proporciona la energa. Cuando se tiene diabetes tipo2, la glucosa no pueden ingresar a las clulas. Esto produce un aumento del nivel de glucosa en la sangre (hiperglucemia). El mdico fijar los objetivos del tratamiento para usted. Generalmente, los resultados de los niveles de glucosa en la sangre deben ser los siguientes:  Antes de las comidas (preprandial): de 80 a 177m/dl (4,4 a 7,247ml/l).  Despus de las comidas (posprandial): por debajo de 18020ml (29m51ml).  Nivel deA1c (hemoglobinaA1c): menos del7%. CUIDADOS EN EL HOGAR Preguntas para hacerle al mdico Puede hacer las siguientes preguntas:  Debo reunirme con un iRadio broadcast assistanta el cuidado de la  diabetes?  Dnde puedo encontrar un grupo de apoyo para personas diabticas?  Qu equipos necesitar para cuidarme en casa?  Qu medicamentos para la diabetes necesito? Cundo debo tomarlos?  Con qu frecuencia debo controlarme el nivel de glucosa en la sangre?  A qu nmero puedo llamar si tengo preguntas?  Cundo es la prxima cita con el mdico? Instrucciones generales  TomeSCANA Corporationicamentos de venta libre y los recetados solamente como se lo haya indicado el mdico.  Concurra a todas las visitas de control  como se lo haya indicado el mdico. Esto es importante. SOLICITE AYUDA SI:  El nivel de glucosa en la sangre es mayor o igual que 240m/dl (13,374ml/dl) durante 2das seguidos.  Ha estado enfermo o ha tenido fiebre durante 2o ms das y no meKickapoo Site 5 Si tiene alguno de estos problemas durante ms de 6horas: ? No puede comer ni beber. ? Siente malestar estomacal (nuseas). ? Vomita. ? La materia fecal es lquida (diarrea). SOLICITE AYUDA DE INMEDIATO SI:  El nivel de glucosa en la sangre est por debajo de 5499ml (3mm55ml).  Est confundido.  Tiene dificultad para hacer lo siguiente: ? Pensar con claridad. ? Respirar.  Tiene niveles moderados o altos de cetonas en la orinForklandta informacin no tiene comoMarine scientistconsejo del mdico. Asegrese de hacerle al mdico cualquier pregunta que tenga. Document Released: 05/18/2008 Document Revised: 06/13/2015 Document Reviewed: 03/25/2015 Elsevier Interactive Patient Education  2018 ElseYanceyvilleel sanguneo de glucosa en los adultos (Blood Glucose Monitoring, Adult) El control del nivel de azcar (glucosa) en la sangre lo ayuda a tener la diabetes bajo control. Tambin ayuda a que usted y el mdico controlen si el tratamiento de la diabetes es eficArmed forces logistics/support/administrative officer control del nivel sanguneo de glucosa implica realizar controles regulares como lo indique el mdico y llevCatering manageristro de los  resultados (registro diario). POR QU DEBO CONTRaisin CityGLUCOSA? Si controla su nivel sanguneo de glucosa con regularidad, podr:  Comprender de qu mPeabody Energy actividad fsica, las enfermedades y los medicamentos inciden en los niveles sanguneos de glucWinesburgonocer el nivel sanguneo de glucosa en cualquier momento dado. Saber rpidamente si el nivel es bajo (hipoglucemia) o alto (hiperglucemia).  Puede ser de ayuda para que usted y el mdico sepan cmo ajustar los medicamentos. CUNDNorth HillsGLUCOSA? Siga las indicaciones del mdico acerca de la frecuencia con la que debe controlar el nivel sanguneo de glucosa. La frecuencia puede depender de:  El tipo de diabetes que tenga.  Si su diabetes est bajo control.  Los medicamentos que toma. Si usted tiene diabetes tipo 1:  Controle su nivel sanguneo de glucosa al menoHalliburton Companyda.  Tambin controle su nivel sanguneo de glucosa: ? Antes de cada inyeccin de insulina. ? Antes y despus de hacer ejercicio. ? EntrHoliday Heightsidas. ? Dos horas despus de una comida. ? Ocasionalmente, entre las 2:00a.m. y las 3:00a.m., como se lo hayan indicado. ? Antes de realOptometristeas peligrosas, comoEnglish as a second language teachersar maquinaria pesada. ? A la hora de acostarse.  Es posible que debaGeophysicist/field seismologist ms frecuencia los niveles sanguneos de glucPloverstTierra Amarilla 10veces por da: ? Si usa [place] bomba de insuTwain HarteSi necesita varias inyecciones diarias. ? Si su diabetes no est bien controlada. ? Si est enfermo. ? Si tiene antecedentes de hipoglucemia grave. ? Si tiene antecedentes de no darse cuenta cundo est bajando su nivel sanguneo de glucosa (hipoglucemia asintomtica). Si usted tiene diabetes tipo 2:  Si recibe insulina u otro medicamento para la diabetes, controle el nivel sanguneo de glucosa al menoToysRuses al da.  Mientras reciba tratamiento intensivo con  insulina, debe medirse el nivel sanguneo de glucosa al menos 4veces al da. SunTrustasionalmente, es posible que deba controlarse entre las 2:00a.m. y las 3:00a.m., segn se lo indiquen.  Tambin controle su nivel sanguneo de glucosa: ? Antes y despus de hacer ejercicio. ? Antes de realOptometrist  tareas peligrosas, English as a second language teacher o usar maquinaria pesada.  Es posible que Geophysicist/field seismologist con ms frecuencia los niveles sanguneos de glucosa si: ? Es necesario ajustar la dosis de sus medicamentos. ? Su diabetes no est bien controlada. ? Est enfermo. QU ES UN REGISTRO [place] DE GLUCOSA?  Un registro diario es un registro de los valores de glucosa en la [place]. Puede ayudarles a usted y a su mdico a: ? Health visitor en su nivel sanguneo de glucosa durante el transcurso del [place]. ? Ajustar su plan de control de la diabetes como sea necesario.  Cada vez que controle su nivel sanguneo de glucosa, anote el resultado y aquellos factores que pueden estar afectando su nivel sanguneo de glucosa, como la dieta y la actividad fsica Designer, multimedia.  La [employer] de los medidores de glucosa guardan un registro de las lecturas realizadas con el medidor. Algunos permiten descargar sus registros en una computadora.  [place] DE GLUCOSA? Siga los siguientes pasos para obtener lecturas precisas de su glucemia: Materiales necesarios  Medidor de Printmaker.  Tiras reactivas para el medidor. Cada medidor tiene sus propias tiras reactivas. Debe usar las tiras reactivas que trae su medidor.  Una aguja para pincharse el dedo (lanceta). No utilice la misma lanceta en ms de una ocasin.  Un dispositivo que sujeta la lanceta (dispositivo de puncin).  Un diario o libro de anotaciones para [employer]. Procedimiento  [employer] con agua y [place].  Pnchese el costado del dedo (no la punta) con Retail buyer. Use un dedo diferente  cada vez.  Frote suavemente el dedo [employer] aparezca una pequea gota de [place].  Siga las instrucciones que vienen con el medidor para Garment/textile technologist tira Comptroller, Midwife la sangre sobre la tira y usar el medidor de Printmaker.  Registre el resultado y las observaciones que desee. Zonas del cuerpo alternativas para realizar las pruebas  Algunos medidores le permiten tomar sangre para la prueba de otras zonas del cuerpo que no son el dedo (zonas alternativas).  Si cree que tiene hipoglucemia o si tiene hipoglucemia asintomtica, no utilice las zonas alternativas del cuerpo. En su lugar, use los dedos.  Es posible que las zonas alternativas no sean tan precisas como los dedos porque el flujo de sangre es ms lento en esas zonas. Esto significa que el resultado que obtiene de estas zonas puede estar retrasado y ser un poco diferente del resultado que obtendra del dedo.  Los sitios alternativos ms comunes son los siguientes: ? Los antebrazos. ? Los muslos. ? La palma de [employer]. Consejos adicionales  Siempre tenga los insumos a mano.  Todos los medidores de glucosa incluyen un nmero de telfono "directo", disponible las 24 horas, al que podr llamar si tiene preguntas o [place]. Tambin puede consultar a su mdico.  Despus de usar algunas cajas de tiras reactivas, ajuste (calibre) el medidor de glucemia segn las instrucciones del medidor. Esta informacin no tiene Marine scientist el consejo del mdico. Asegrese de hacerle al mdico cualquier pregunta que tenga. Document Released: 02/19/2005 Document Revised: 06/13/2015 Document Reviewed: 08/01/2015 Elsevier Interactive Patient Education  2017 Elsevier Inc.      Agustina Caroli, MD [facility]

## 2016-10-19 NOTE — Assessment & Plan Note (Signed)
A1C still very high; pt's compliance in question; review of insulin regimen shows Levemir 18 units bid and Novolin R 2 units with meals bid; not taking insulin with lunch. Advised to increase Levemir to 20 units bid and to make sure he takes regular insulin with every meal including lunch. Has respiratory infection which may be worsening his hyperglycemia.

## 2016-10-20 LAB — URINE CULTURE

## 2016-10-25 ENCOUNTER — Other Ambulatory Visit: Payer: Self-pay | Admitting: Emergency Medicine

## 2016-10-26 ENCOUNTER — Telehealth: Payer: Self-pay | Admitting: *Deleted

## 2016-10-26 NOTE — Telephone Encounter (Signed)
Faxed completed and signed Rx authorization to [facility]. Confirmation page received at 6:33 pm.

## 2016-10-26 NOTE — Telephone Encounter (Signed)
Faxed signed authorization for Janumet XR 07/998 mg to [facility]. Confirmation page received at 6:34 pm.

## 2016-11-06 NOTE — Telephone Encounter (Signed)
Per Cynthia's note on 10/26/2016 PA was sent into pharmacy that day.  Received denial fax today. A copy will be placed in your box by the end of the day today.

## 2016-12-17 DIAGNOSIS — N401 Enlarged prostate with lower urinary tract symptoms: Secondary | ICD-10-CM | POA: Diagnosis not present

## 2016-12-26 DIAGNOSIS — R339 Retention of urine, unspecified: Secondary | ICD-10-CM

## 2017-01-23 ENCOUNTER — Ambulatory Visit (INDEPENDENT_AMBULATORY_CARE_PROVIDER_SITE_OTHER): Payer: 59 | Admitting: Emergency Medicine

## 2017-01-23 ENCOUNTER — Encounter: Payer: Self-pay | Admitting: Emergency Medicine

## 2017-01-23 ENCOUNTER — Other Ambulatory Visit: Payer: Self-pay

## 2017-01-23 VITALS — BP 108/70 | HR 73 | Temp 98.2°F | Resp 16 | Ht 65.0 in | Wt 151.6 lb

## 2017-01-23 DIAGNOSIS — E1165 Type 2 diabetes mellitus with hyperglycemia: Secondary | ICD-10-CM | POA: Diagnosis not present

## 2017-01-23 DIAGNOSIS — IMO0001 Reserved for inherently not codable concepts without codable children: Secondary | ICD-10-CM

## 2017-01-23 DIAGNOSIS — Z794 Long term (current) use of insulin: Secondary | ICD-10-CM | POA: Diagnosis not present

## 2017-01-23 DIAGNOSIS — I10 Essential (primary) hypertension: Secondary | ICD-10-CM

## 2017-01-23 DIAGNOSIS — R059 Cough, unspecified: Secondary | ICD-10-CM

## 2017-01-23 DIAGNOSIS — R12 Heartburn: Secondary | ICD-10-CM | POA: Diagnosis not present

## 2017-01-23 DIAGNOSIS — R05 Cough: Secondary | ICD-10-CM

## 2017-01-23 LAB — POCT GLYCOSYLATED HEMOGLOBIN (HGB A1C): Hemoglobin A1C: 12.8

## 2017-01-23 MED ORDER — PANTOPRAZOLE SODIUM 40 MG PO TBEC: 40.0000 mg | DELAYED_RELEASE_TABLET | Freq: Every day | ORAL | 1 refills | Status: DC

## 2017-01-23 MED ORDER — AZITHROMYCIN 250 MG PO TABS: ORAL_TABLET | ORAL | 0 refills | Status: DC

## 2017-01-23 MED ORDER — LISINOPRIL 5 MG PO TABS: 5.0000 mg | ORAL_TABLET | Freq: Every day | ORAL | 3 refills | Status: DC

## 2017-01-23 NOTE — Assessment & Plan Note (Signed)
A1C still elevated; questionable compliance with medication and understanding of disease despite multiple explanations; not taking insulin as directed last time and not sure patient is taking Janumet. Will refer to Endocrinologist. Instructed again on how to use medications and when to use them.

## 2017-01-23 NOTE — Progress Notes (Signed)
Joseph Roberts 56 y.o.   Chief Complaint  Patient presents with  . Follow-up    3 months diabetes  . Cough    2 weeks    HISTORY OF PRESENT ILLNESS: This is a 56 y.o. male here for DM f/u; compliant with meds but did not increase insulin as instructed last visit; takes 18 units Levemir bid and 6 units regular insulin after meals (told to take it before). Also c/o productive cough.  HPI   Prior to Admission medications   Medication Sig Start Date End Date Taking? Authorizing Provider  atorvastatin (LIPITOR) 40 MG tablet Take 1 tablet (40 mg total) by mouth daily. 10/17/15  Yes Weber, Sarah L, PA-C  Blood Glucose Monitoring Suppl (ONE TOUCH ULTRA MINI) W/DEVICE KIT 1 each by Does not apply route daily. Test fasting daily   Yes [provider]  buPROPion (WELLBUTRIN SR) 150 MG 12 hr tablet Take 1 tablet (150 mg total) by mouth daily. 10/19/16  Yes Eleonora Peeler, Ines Bloomer, MD  cyclobenzaprine (FLEXERIL) 10 MG tablet Take 1 tablet (10 mg total) by mouth 3 (three) times daily as needed for muscle spasms. 06/14/14  Yes Roselee Culver, MD  fenofibrate 160 MG tablet Take 1 tablet (160 mg total) by mouth daily. 03/09/16  Yes Scot Jun, FNP  insulin detemir (LEVEMIR) 100 UNIT/ML injection Inject 0.18 mLs (18 Units total) into the skin 2 (two) times daily. 07/13/16  Yes Jaynee Eagles, PA-C  insulin regular (HUMULIN R) 100 units/mL injection Inject 0.02 mLs (2 Units total) into the skin 3 (three) times daily before meals. 07/13/16  Yes Jaynee Eagles, PA-C  Insulin Syringe-Needle U-100 (INSULIN SYRINGE .5CC/30GX5/16") 30G X 5/16" 0.5 ML MISC Administer insulin as instructed 03/08/16  Yes Reyne Dumas, MD  lisinopril (PRINIVIL,ZESTRIL) 5 MG tablet Take 1 tablet (5 mg total) by mouth daily. 03/09/16  Yes Scot Jun, FNP  pantoprazole (PROTONIX) 40 MG tablet Take 1 tablet (40 mg total) by mouth daily. 07/13/16  Yes Jaynee Eagles, PA-C  SitaGLIPtin-MetFORMIN HCl 50-1000 MG TB24 Take 1 tablet  by mouth 2 (two) times daily with a meal. 10/19/16  Yes Edgar Corrigan, Ines Bloomer, MD  azithromycin [facility]) 250 MG tablet Sig as indicated Patient not taking: Reported on 01/23/2017 10/19/16   Horald Pollen, MD  dicyclomine (BENTYL) 10 MG capsule Take 1 pill 3 times daily if needed for abdominal pain Patient not taking: Reported on 03/09/2016 07/11/15   Posey Boyer, MD  glucose blood test strip 1 each by Other route as needed for other. Check CBG 3 times a day before meals 03/08/16   Reyne Dumas, MD  terazosin (HYTRIN) 1 MG capsule Take 1 mg by mouth daily. 02/25/16   [provider]    No Known Allergies  Patient Active Problem List   Diagnosis Date Noted  . Urinary urgency 10/19/2016  . Essential hypertension 03/06/2016  . Acute kidney injury ([place]) 03/06/2016  . Urinary retention 03/06/2016  . Uncontrolled type 2 diabetes mellitus with complication ([place])   . HHNC (hyperglycemic hyperosmolar nonketotic coma) ([place])   . Dyslipidemia   . Hyperglycemia 03/05/2016  . Type 2 diabetes mellitus with hyperglycemia ([place]) 03/05/2016  . 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 Dyslipidemia with low high density lipoprotein (HDL) cholesterol with hypertriglyceridemia due to type 2 diabetes mellitus ([place]) 11/03/2013  . Gall bladder polyp 10/24/2013  . GERD (gastroesophageal reflux disease) 10/24/2013  . Uncontrolled type 2 diabetes mellitus without complication, with long-term current use of insulin ([place])  10/23/2013    Past Medical History:  Diagnosis Date  . Diabetes mellitus 2011  . High triglycerides     Past Surgical History:  Procedure Laterality Date  . COLONOSCOPY N/A 10/24/2013   Procedure: COLONOSCOPY;  Surgeon: Ladene Artist, MD;  Location: [facility] ENDOSCOPY;  Service: Endoscopy;  Laterality: N/A;. diverticulosis, mild, small internal hemorrhoids    Social History   Socioeconomic History  . Marital status: Married    Spouse name: Not on file  . Number of children: Not on  file  . Years of education: Not on file  . Highest education level: Not on file  Social Needs  . Financial resource strain: Not on file  . Food insecurity - worry: Not on file  . Food insecurity - inability: Not on file  . Transportation needs - medical: Not on file  . Transportation needs - non-medical: Not on file  Occupational History  . Not on file  Tobacco Use  . Smoking status: Current Every Day Smoker    Packs/day: 0.50    Years: 30.00    Pack years: 15.00    Types: Cigarettes  . Smokeless tobacco: Never Used  Substance and Sexual Activity  . Alcohol use: No  . Drug use: No  . Sexual activity: Not on file  Other Topics Concern  . Not on file  Social History Narrative  . Not on file    Family History  Problem Relation Age of Onset  . Diabetes Mother   . Diabetes Sister   . Diabetes Brother      Review of Systems  Constitutional: Negative.  Negative for chills, fever, malaise/fatigue and weight loss.  HENT: Negative.  Negative for sore throat.   Eyes: Negative.  Negative for blurred vision and double vision.  Respiratory: Positive for cough. Negative for hemoptysis and shortness of breath.   Cardiovascular: Negative.  Negative for chest pain and leg swelling.  Gastrointestinal: Negative.  Negative for abdominal pain, blood in stool, diarrhea, nausea and vomiting.  Genitourinary: Negative.  Negative for dysuria and hematuria.  Musculoskeletal: Negative.   Skin: Negative for rash.  Neurological: Negative.  Negative for dizziness, weakness and headaches.  Endo/Heme/Allergies: Negative.   All other systems reviewed and are negative.  Vitals:   01/23/17 0805  BP: 108/70  Pulse: 73  Resp: 16  Temp: 98.2 F (36.8 C)  SpO2: 98%     Physical Exam  Constitutional: He is oriented to person, place, and time. He appears well-developed and well-nourished.  HENT:  Head: Normocephalic and atraumatic.  Nose: Nose normal.  Mouth/Throat: Oropharynx is clear and  moist.  Eyes: Conjunctivae and EOM are normal. Pupils are equal, round, and reactive to light.  Neck: Normal range of motion. Neck supple. No JVD present.  Cardiovascular: Normal rate, regular rhythm, normal heart sounds and intact distal pulses.  Pulmonary/Chest: Effort normal and breath sounds normal.  Abdominal: Soft. Bowel sounds are normal. There is no tenderness.  Musculoskeletal: Normal range of motion.  Lymphadenopathy:    He has no cervical adenopathy.  Neurological: He is alert and oriented to person, place, and time. No sensory deficit. He exhibits normal muscle tone.  Skin: Skin is warm and dry. Capillary refill takes less than 2 seconds. No rash noted.  Psychiatric: He has a normal mood and affect. His behavior is normal.  Vitals reviewed.    Results for orders placed or performed in visit on 01/23/17 (from the past 24 hour(s))  POCT glycosylated hemoglobin (Hb A1C)  Status: None   Collection Time: 01/23/17  8:52 AM  Result Value Ref Range   Hemoglobin A1C 12.8    Uncontrolled type 2 diabetes mellitus without complication, with long-term current use of insulin (HCC) A1C still elevated; questionable compliance with medication and understanding of disease despite multiple explanations; not taking insulin as directed last time and not sure patient is taking Janumet. Will refer to [place] again on how to use medications and when to use them.   ASSESSMENT & PLAN: Lukas was seen today for follow-up and cough.  Diagnoses and all orders for this visit:  Uncontrolled type 2 diabetes mellitus without complication, with long-term current use of insulin (HCC) -     POCT glycosylated hemoglobin (Hb A1C) -     Ambulatory referral to Endocrinology  Cough  Essential hypertension -     lisinopril (PRINIVIL,ZESTRIL) 5 MG tablet; Take 1 tablet (5 mg total) by mouth daily.  Heartburn -     pantoprazole (PROTONIX) 40 MG tablet; Take 1 tablet (40 mg total) by  mouth daily.  Other orders -     azithromycin (ZITHROMAX) 250 MG tablet; Sig as indicated   Patient Instructions       IF you received an x-ray today, you will receive an invoice from [facility]. Please contact [facility] at [phone] with questions or concerns regarding your invoice.   IF you received labwork today, you will receive an invoice from [place]. Please contact [facility] at [phone] with questions or concerns regarding your invoice.   Our billing staff will not be able to assist you with questions regarding bills from these companies.  You will be contacted with the lab results as soon as they are available. The fastest way to get your results is to activate your My Chart account. Instructions are located on the last page of this paperwork. If you have not heard from [place] regarding the results in 2 weeks, please contact this office.    La diabetes mellitus y los alimentos (Diabetes Mellitus and Food) Es importante que controle su nivel de azcar en la sangre (glucosa). El nivel de glucosa en sangre depende en gran medida de lo que usted come. [place] alimentos saludables en las cantidades [place] a lo largo del Training and development officer, aproximadamente a la misma hora [employer], lo ayudar a Chief Technology Officer su nivel de Multimedia programmer. Tambin puede ayudarlo a retrasar o Patent attorney de la diabetes mellitus. Comer de [employer] saludable incluso puede ayudarlo a Chartered loss adjuster de presin arterial y a Science writer o Theatre manager un peso saludable. Entre las recomendaciones generales para alimentarse y Audiological scientist los alimentos de forma saludable, se incluyen las siguientes:  Respetar las comidas principales y comer colaciones con regularidad. Evitar pasar largos perodos sin comer con el fin de perder peso.  Seguir una dieta que consista principalmente en alimentos de origen vegetal, como frutas, vegetales, frutos secos, legumbres y cereales integrales.  Utilizar mtodos de  coccin a baja temperatura, como hornear, en lugar de mtodos de coccin a alta temperatura, como frer en abundante aceite. Trabaje con el nutricionista para aprender a Financial planner nutricional de las etiquetas de los alimentos. CMO PUEDEN AFECTARME LOS ALIMENTOS? Carbohidratos Los carbohidratos afectan el nivel de glucosa en sangre ms que cualquier otro tipo de alimento. El nutricionista lo ayudar a Teacher, adult education cuntos carbohidratos puede consumir en cada comida y ensearle a contarlos. El recuento de carbohidratos es importante para mantener la glucosa en sangre en un nivel saludable, en especial  si utiliza insulina o toma determinados medicamentos para la diabetes mellitus. Alcohol El alcohol puede provocar disminuciones sbitas de la glucosa en sangre (hipoglucemia), en especial si utiliza insulina o toma determinados medicamentos para la diabetes mellitus. La hipoglucemia es una afeccin que puede poner en peligro la vida. Los sntomas de la hipoglucemia (somnolencia, mareos y Data processing manager) son similares a los sntomas de haber consumido mucho alcohol. Si el mdico lo autoriza a beber alcohol, hgalo con moderacin y siga estas pautas:  Las mujeres no deben beber ms de un trago por da, y los hombres no deben beber ms de dos tragos por Training and development officer. Un trago es igual a: ? 12 onzas (355 ml) de cerveza ? 5 onzas de vino (150 ml) de vino ? 1,5onzas (20m) de bebidas espirituosas  No beba con el estmago vaco.  Mantngase hidratado. Beba agua, gaseosas dietticas o t helado sin azcar.  Las gaseosas comunes, los jugos y otros refrescos podran contener muchos carbohidratos y se dCivil Service fast streamer QU ALIMENTOS NO SE RECOMIENDAN? Cuando haga las elecciones de alimentos, es importante que recuerde que todos los alimentos son distintos. Algunos tienen menos nutrientes que otros por porcin, aunque podran tener la misma cantidad de caloras o carbohidratos. Es difcil darle al cuerpo lo que  necesita cuando consume alimentos con menos nutrientes. Estos son algunos ejemplos de alimentos que debera evitar ya que contienen muchas caloras y carbohidratos, pero pocos nutrientes:  GPhysicist, medicaltrans (la mayora de los alimentos procesados incluyen grasas trans en la etiqueta de Informacin nutricional).  Gaseosas comunes.  Jugos.  Caramelos.  Dulces, como tortas, pasteles, rosquillas y gChickaloon  Comidas fritas. QU ALIMENTOS PUEDO COMER? Consuma alimentos ricos en nutrientes, que nutrirn el cuerpo y lo mantendrn saludable. Los alimentos que debe comer tambin dependern de varios factores, como:  Las caloras que necesita.  Los medicamentos que toma.  Su peso.  El nivel de glucosa en sRogers  El nMount [place] presin arterial.  El nivel de colesterol. Debe consumir una amplia variedad de alimentos, por ejemplo:  Protenas. ? Cortes de c[employer] ? Protenas con bajo contenido de grasas saturadas, como pescado, clara de huevo y frijoles. Evite las carnes procesadas.  Frutas y vegetales. ? FCote d'Ivoirey vPhotographerque pueden ayudar a cIllinois Tool Worksniveles sanguneos de gCylinder como mTyro mangos y batatas.  Productos lcteos. ? Elija productos lcteos sin grasa o con bajo contenido de g[place] como l[place] yogur y qBay Head  Cereales, panes, pastas y arroz. ? Elija cereales integrales, como panes multicereales, avena en grano y arroz integral. Estos alimentos pueden ayudar a controlar la presin arterial.  GDaphene Jaeger ? Alimentos que contengan grasas saludables, como frutos secos, aMusician aceite de oStonecrest aceite de canola y pescado. TODOS LOS QUE PADECEN DIABETES MELLITUS TIENEN EL MByronPLAN DE CFalls [place] Dado que todas las personas que padecen diabetes mellitus son distintas, no hay un solo plan de comidas que funcione para todos. Es muy importante que se rena con un nutricionista que lo ayudar a crear un plan de comidas adecuado para usted. Esta informacin no tiene cBuyer, retailel consejo del mdico. Asegrese de hacerle al mdico cualquier pregunta que tenga. Document Released: 05/29/2007 Document Revised: 03/12/2014 Document Reviewed: 01/16/2013 Elsevier Interactive Patient Education  2017 Elsevier Inc.      MAgustina Caroli MD [facility]

## 2017-01-23 NOTE — Patient Instructions (Addendum)
   IF you received an x-ray today, you will receive an invoice from [facility]. Please contact  Radiology at [phone] with questions or concerns regarding your invoice.   IF you received labwork today, you will receive an invoice from [facility]. Please contact [facility] at [phone] with questions or concerns regarding your invoice.   Our billing staff will not be able to assist you with questions regarding bills from these companies.  You will be contacted with the lab results as soon as they are available. The fastest way to get your results is to activate your My Chart account. Instructions are located on the last page of this paperwork. If you have not heard from us regarding the results in 2 weeks, please contact this office.     La diabetes mellitus y los alimentos (Diabetes Mellitus and Food) Es importante que controle su nivel de azcar en la sangre (glucosa). El nivel de glucosa en sangre depende en gran medida de lo que usted come. [place] alimentos saludables en las cantidades adecuadas a lo largo del da, aproximadamente a la misma hora todos los das, lo ayudar a controlar su nivel de glucosa en sangre. Tambin puede ayudarlo a retrasar o evitar el empeoramiento de la diabetes mellitus. Comer de manera saludable incluso puede ayudarlo a mejorar el nivel de presin arterial y a alcanzar o mantener un peso saludable. Entre las recomendaciones generales para alimentarse y cocinar los alimentos de forma saludable, se incluyen las siguientes:  Respetar las comidas principales y comer colaciones con regularidad. Evitar pasar largos perodos sin comer con el fin de perder peso.  Seguir una dieta que consista principalmente en alimentos de origen vegetal, como frutas, vegetales, frutos secos, legumbres y cereales integrales.  Utilizar mtodos de coccin a baja temperatura, como hornear, en lugar de mtodos de coccin a alta temperatura, como frer en abundante  aceite. Trabaje con el nutricionista para aprender a usar la informacin nutricional de las etiquetas de los alimentos. CMO PUEDEN AFECTARME LOS ALIMENTOS? Carbohidratos Los carbohidratos afectan el nivel de glucosa en sangre ms que cualquier otro tipo de alimento. El nutricionista lo ayudar a determinar cuntos carbohidratos puede consumir en cada comida y ensearle a contarlos. El recuento de carbohidratos es importante para mantener la glucosa en sangre en un nivel saludable, en especial si utiliza insulina o toma determinados medicamentos para la diabetes mellitus. Alcohol El alcohol puede provocar disminuciones sbitas de la glucosa en sangre (hipoglucemia), en especial si utiliza insulina o toma determinados medicamentos para la diabetes mellitus. La hipoglucemia es una afeccin que puede poner en peligro la vida. Los sntomas de la hipoglucemia (somnolencia, mareos y desorientacin) son similares a los sntomas de haber consumido mucho alcohol. Si el mdico lo autoriza a beber alcohol, hgalo con moderacin y siga estas pautas:  Las mujeres no deben beber ms de un trago por da, y los hombres no deben beber ms de dos tragos por da. Un trago es igual a:  12 onzas (355 ml) de cerveza  5 onzas de vino (150 ml) de vino  1,5onzas (45ml) de bebidas espirituosas  No beba con el estmago vaco.  Mantngase hidratado. Beba agua, gaseosas dietticas o t helado sin azcar.  Las gaseosas comunes, los jugos y otros refrescos podran contener muchos carbohidratos y se deben contar. QU ALIMENTOS NO SE RECOMIENDAN? Cuando haga las elecciones de alimentos, es importante que recuerde que todos los alimentos son distintos. Algunos tienen menos nutrientes que otros por porcin, aunque podran tener la misma   cantidad de caloras o carbohidratos. Es difcil darle al cuerpo lo que necesita cuando consume alimentos con menos nutrientes. Estos son algunos ejemplos de alimentos que debera evitar ya  que contienen muchas caloras y carbohidratos, pero pocos nutrientes:  Grasas trans (la mayora de los alimentos procesados incluyen grasas trans en la etiqueta de Informacin nutricional).  Gaseosas comunes.  Jugos.  Caramelos.  Dulces, como tortas, pasteles, rosquillas y galletas.  Comidas fritas. QU ALIMENTOS PUEDO COMER? Consuma alimentos ricos en nutrientes, que nutrirn el cuerpo y lo mantendrn saludable. Los alimentos que debe comer tambin dependern de varios factores, como:  Las caloras que necesita.  Los medicamentos que toma.  Su peso.  El nivel de glucosa en sangre.  El nivel de presin arterial.  El nivel de colesterol. Debe consumir una amplia variedad de alimentos, por ejemplo:  Protenas.  Cortes de carne magros.  Protenas con bajo contenido de grasas saturadas, como pescado, clara de huevo y frijoles. Evite las carnes procesadas.  Frutas y vegetales.  Frutas y vegetales que pueden ayudar a controlar los niveles sanguneos de glucosa, como manzanas, mangos y batatas.  Productos lcteos.  Elija productos lcteos sin grasa o con bajo contenido de grasa, como leche, yogur y queso.  Cereales, panes, pastas y arroz.  Elija cereales integrales, como panes multicereales, avena en grano y arroz integral. Estos alimentos pueden ayudar a controlar la presin arterial.  Grasas.  Alimentos que contengan grasas saludables, como frutos secos, aguacate, aceite de oliva, aceite de canola y pescado. TODOS LOS QUE PADECEN DIABETES MELLITUS TIENEN EL MISMO PLAN DE COMIDAS? Dado que todas las personas que padecen diabetes mellitus son distintas, no hay un solo plan de comidas que funcione para todos. Es muy importante que se rena con un nutricionista que lo ayudar a crear un plan de comidas adecuado para usted. Esta informacin no tiene como fin reemplazar el consejo del mdico. Asegrese de hacerle al mdico cualquier pregunta que tenga. Document Released:  05/29/2007 Document Revised: 03/12/2014 Document Reviewed: 01/16/2013 Elsevier Interactive Patient Education  2017 Elsevier Inc.  

## 2017-01-25 ENCOUNTER — Ambulatory Visit: Payer: 59 | Admitting: Emergency Medicine

## 2017-04-12 ENCOUNTER — Ambulatory Visit: Payer: 59 | Admitting: Endocrinology

## 2017-04-26 ENCOUNTER — Encounter: Payer: Self-pay | Admitting: Emergency Medicine

## 2017-04-26 ENCOUNTER — Other Ambulatory Visit: Payer: Self-pay

## 2017-04-26 ENCOUNTER — Ambulatory Visit: Payer: 59 | Admitting: Emergency Medicine

## 2017-04-26 VITALS — BP 106/68 | HR 88 | Temp 97.9°F | Resp 16 | Ht 65.0 in | Wt 151.2 lb

## 2017-04-26 DIAGNOSIS — E782 Mixed hyperlipidemia: Secondary | ICD-10-CM

## 2017-04-26 DIAGNOSIS — E1169 Type 2 diabetes mellitus with other specified complication: Secondary | ICD-10-CM | POA: Diagnosis not present

## 2017-04-26 DIAGNOSIS — Z794 Long term (current) use of insulin: Secondary | ICD-10-CM

## 2017-04-26 DIAGNOSIS — E1165 Type 2 diabetes mellitus with hyperglycemia: Secondary | ICD-10-CM | POA: Diagnosis not present

## 2017-04-26 DIAGNOSIS — IMO0001 Reserved for inherently not codable concepts without codable children: Secondary | ICD-10-CM

## 2017-04-26 MED ORDER — ATORVASTATIN CALCIUM 40 MG PO TABS: 40.0000 mg | ORAL_TABLET | Freq: Every day | ORAL | 0 refills | Status: DC

## 2017-04-26 NOTE — Progress Notes (Signed)
Joseph Roberts 57 y.o.   Chief Complaint  Patient presents with  . Diabetes    FOLLOW UP  . Medication Refill    BUPROPION    HISTORY OF PRESENT ILLNESS: This is a 57 y.o. male with history of diabetes insulin-dependent, here for follow-up.  Has no complaints.  Compliant with medications.  Medication list reviewed with patient.  Previous lab results reviewed with patient. Has some LUTS symptoms but was seen by urologist 5- 6 months ago and was told his prostate is normal. Chronic smoker.  At some point Wellbutrin was prescribed but he has not taken it in over a month.  Has no real intention of quitting smoking.  HPI   Prior to Admission medications   Medication Sig Start Date End Date Taking? Authorizing Provider  buPROPion (WELLBUTRIN SR) 150 MG 12 hr tablet Take 1 tablet (150 mg total) by mouth daily. 10/19/16  Yes Joni Colegrove, Ines Bloomer, MD  CETIRIZINE HCL PO Take by mouth daily.   Yes [provider]  dicyclomine (BENTYL) 10 MG capsule Take 1 pill 3 times daily if needed for abdominal pain 07/11/15  Yes Posey Boyer, MD  insulin detemir (LEVEMIR) 100 UNIT/ML injection Inject 0.18 mLs (18 Units total) into the skin 2 (two) times daily. 07/13/16  Yes Jaynee Eagles, PA-C  insulin regular (HUMULIN R) 100 units/mL injection Inject 0.02 mLs (2 Units total) into the skin 3 (three) times daily before meals. 07/13/16  Yes Jaynee Eagles, PA-C  lisinopril (PRINIVIL,ZESTRIL) 5 MG tablet Take 1 tablet (5 mg total) by mouth daily. 01/23/17  Yes Elverna Caffee, Ines Bloomer, MD  pantoprazole (PROTONIX) 40 MG tablet Take 1 tablet (40 mg total) by mouth daily. 01/23/17  Yes Kerington Hildebrant, Ines Bloomer, MD  SitaGLIPtin-MetFORMIN HCl 50-1000 MG TB24 Take 1 tablet by mouth 2 (two) times daily with a meal. 10/19/16  Yes Glennie Rodda, Ines Bloomer, MD  atorvastatin (LIPITOR) 40 MG tablet Take 1 tablet (40 mg total) by mouth daily. Patient not taking: Reported on 04/26/2017 10/17/15   Mancel Bale, PA-C  Blood Glucose  Monitoring Suppl (ONE TOUCH ULTRA MINI) W/DEVICE KIT 1 each by Does not apply route daily. Test fasting daily    [provider]  cyclobenzaprine (FLEXERIL) 10 MG tablet Take 1 tablet (10 mg total) by mouth 3 (three) times daily as needed for muscle spasms. Patient not taking: Reported on 04/26/2017 06/14/14   Roselee Culver, MD  fenofibrate 160 MG tablet Take 1 tablet (160 mg total) by mouth daily. 03/09/16   Scot Jun, FNP  glucose blood test strip 1 each by Other route as needed for other. Check CBG 3 times a day before meals 03/08/16   Reyne Dumas, MD  Insulin Syringe-Needle U-100 (INSULIN SYRINGE .5CC/30GX5/16") 30G X 5/16" 0.5 ML MISC Administer insulin as instructed 03/08/16   Reyne Dumas, MD  terazosin (HYTRIN) 1 MG capsule Take 1 mg by mouth daily. 02/25/16   [provider]    No Known Allergies  Patient Active Problem List   Diagnosis Date Noted  . Urinary urgency 10/19/2016  . Essential hypertension 03/06/2016  . Acute kidney injury ([place]) 03/06/2016  . Urinary retention 03/06/2016  . Uncontrolled type 2 diabetes mellitus with complication ([place])   . HHNC (hyperglycemic hyperosmolar nonketotic coma) ([place])   . Dyslipidemia   . Hyperglycemia 03/05/2016  . Type 2 diabetes mellitus with hyperglycemia ([place]) 03/05/2016  . 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 Dyslipidemia with low high density lipoprotein (HDL) cholesterol with hypertriglyceridemia  due to type 2 diabetes mellitus ([place]) 11/03/2013  . Gall bladder polyp 10/24/2013  . GERD (gastroesophageal reflux disease) 10/24/2013  . Uncontrolled type 2 diabetes mellitus without complication, with long-term current use of insulin ([place]) 10/23/2013    Past Medical History:  Diagnosis Date  . Diabetes mellitus 2011  . High triglycerides     Past Surgical History:  Procedure Laterality Date  . COLONOSCOPY N/A 10/24/2013   Procedure: COLONOSCOPY;  Surgeon: Ladene Artist, MD;  Location: [facility] ENDOSCOPY;   Service: Endoscopy;  Laterality: N/A;. diverticulosis, mild, small internal hemorrhoids    Social History   Socioeconomic History  . Marital status: Married    Spouse name: Not on file  . Number of children: Not on file  . Years of education: Not on file  . Highest education level: Not on file  Social Needs  . Financial resource strain: Not on file  . Food insecurity - worry: Not on file  . Food insecurity - inability: Not on file  . Transportation needs - medical: Not on file  . Transportation needs - non-medical: Not on file  Occupational History  . Not on file  Tobacco Use  . Smoking status: Current Every Day Smoker    Packs/day: 0.50    Years: 30.00    Pack years: 15.00    Types: Cigarettes  . Smokeless tobacco: Never Used  Substance and Sexual Activity  . Alcohol use: No  . Drug use: No  . Sexual activity: Not on file  Other Topics Concern  . Not on file  Social History Narrative  . Not on file    Family History  Problem Relation Age of Onset  . Diabetes Mother   . Diabetes Sister   . Diabetes Brother      Review of Systems  Constitutional: Negative.  Negative for chills, fever and malaise/fatigue.  HENT: Negative.  Negative for congestion, hearing loss and sore throat.   Eyes: Negative.  Negative for blurred vision and double vision.  Respiratory: Negative.  Negative for cough, hemoptysis and shortness of breath.   Cardiovascular: Negative.  Negative for chest pain and palpitations.  Gastrointestinal: Negative.  Negative for abdominal pain, blood in stool, diarrhea, nausea and vomiting.  Genitourinary: Negative for dysuria, flank pain and hematuria.       Occasional urgency with difficulty initiating urination.  Wakes up to 3 times at night to urinate.  Has some dribbling.  Musculoskeletal: Negative.  Negative for myalgias and neck pain.  Skin: Negative for rash.  Neurological: Negative.  Negative for dizziness, weakness and headaches.    Endo/Heme/Allergies: Negative.   All other systems reviewed and are negative.  Vitals:   04/26/17 0847  BP: 106/68  Pulse: 88  Resp: 16  Temp: 97.9 F (36.6 C)  SpO2: 97%     Physical Exam  Constitutional: He is oriented to person, place, and time. He appears well-developed and well-nourished.  HENT:  Head: Normocephalic and atraumatic.  Right Ear: External ear normal.  Left Ear: External ear normal.  Nose: Nose normal.  Mouth/Throat: Oropharynx is clear and moist.  Eyes: Conjunctivae and EOM are normal. Pupils are equal, round, and reactive to light.  Neck: Normal range of motion. Neck supple. No JVD present. No thyromegaly present.  Cardiovascular: Normal rate, regular rhythm and normal heart sounds.  Pulmonary/Chest: Effort normal and breath sounds normal.  Abdominal: Soft. Bowel sounds are normal. He exhibits no distension. There is no tenderness.  Musculoskeletal: Normal range of motion.  Neurological: He is alert and oriented to person, place, and time. No sensory deficit. He exhibits normal muscle tone.  Skin: Skin is warm and dry. Capillary refill takes less than 2 seconds. No rash noted.  Psychiatric: He has a normal mood and affect. His behavior is normal.  Vitals reviewed.  A total of 25 minutes was spent in the room with the patient, greater than 50% of which was in counseling/coordination of care.   ASSESSMENT & PLAN: Kristi was seen today for diabetes and medication refill.  Diagnoses and all orders for this visit:  Uncontrolled type 2 diabetes mellitus without complication, with long-term current use of insulin (HCC) -     Comprehensive metabolic panel -     Hemoglobin A1c -     CBC with Differential/Platelet -     PSA -     Lipid panel  Dyslipidemia with low high density lipoprotein (HDL) cholesterol with hypertriglyceridemia due to type 2 diabetes mellitus (HCC) -     atorvastatin (LIPITOR) 40 MG tablet; Take 1 tablet (40 mg total) by mouth  daily.    Patient Instructions       IF you received an x-ray today, you will receive an invoice from [facility]. Please contact [facility] at [phone] with questions or concerns regarding your invoice.   IF you received labwork today, you will receive an invoice from [place]. Please contact [facility] at [phone] with questions or concerns regarding your invoice.   Our billing staff will not be able to assist you with questions regarding bills from these companies.  You will be contacted with the lab results as soon as they are available. The fastest way to get your results is to activate your My Chart account. Instructions are located on the last page of this paperwork. If you have not heard from [place] regarding the results in 2 weeks, please contact this office.     Diabetes mellitus y nutricin Diabetes Mellitus and Nutrition Si sufre de diabetes (diabetes mellitus), es muy importante tener hbitos alimenticios saludables debido a que sus niveles de Designer, television/film set sangre (glucosa) se ven afectados en gran medida por lo que come y bebe. [place] alimentos saludables en las cantidades [place], aproximadamente a la [employer], [place] ayudar a:  Aeronautical engineer glucemia.  Disminuir el riesgo de sufrir una enfermedad cardaca.  Mejorar la presin arterial.  Science writer o mantener un peso saludable.  Todas las personas que sufren de diabetes son diferentes y cada una tiene necesidades diferentes en cuanto a un plan de alimentacin. El mdico puede recomendarle que trabaje con un especialista en dietas y nutricin (nutricionista) para Financial trader plan para usted. Su plan de alimentacin puede variar segn factores como:  Las caloras que necesita.  Los medicamentos que toma.  Su peso.  Sus niveles de glucemia, presin arterial y colesterol.  Su nivel de [place].  Otras afecciones que tenga, como enfermedades cardacas o renales.  Cmo me  afectan los carbohidratos? Los carbohidratos afectan el nivel de glucemia ms que cualquier otro tipo de alimento. La ingesta de carbohidratos naturalmente aumenta la cantidad glucosa en la sangre. El recuento de carbohidratos es un mtodo destinado a Catering manager un registro de la cantidad de carbohidratos que se ingieren. El recuento de carbohidratos es importante para Theatre manager la glucemia a un nivel saludable, en especial si utiliza insulina o toma determinados medicamentos por va oral para la diabetes. Es importante saber la cantidad de carbohidratos que se pueden ingerir  en cada comida sin correr ningn riesgo. Esto es Psychologist, forensic. El nutricionista puede ayudarlo a calcular la cantidad de carbohidratos que debe ingerir en cada comida y colacin. Los alimentos que contienen carbohidratos incluyen:  Pan, cereal, arroz, pasta y galletas.  Papas y maz.  Guisantes, frijoles y lentejas.  Leche y Estate agent.  Lambert Mody y [place].  Postres, como pasteles, galletitas, helado y caramelos.  Cmo me afecta el alcohol? El alcohol puede provocar disminuciones sbitas de la glucemia (hipoglucemia), en especial si utiliza insulina o toma determinados medicamentos por va oral para la diabetes. La hipoglucemia es una afeccin potencialmente mortal. Los sntomas de la hipoglucemia (somnolencia, mareos y confusin) son similares a los sntomas de haber consumido demasiado alcohol. Si el mdico afirma que el alcohol es seguro para usted, siga estas pautas:  Limite el consumo de alcohol a no ms de 1 medida por da si es mujer y no est Music therapist, y a 2 medidas si es hombre. Una medida equivale a 12oz (356m) de cerveza, 5oz (1427m de vino o 1oz (4490mde bebidas de alta graduacin alcohlica.  No beba con el estmago vaco.  Mantngase hidratado con agua, gaseosas dietticas o t helado sin azcar.  Tenga en cuenta que las gaseosas comunes, los jugos y otros refrescos pueden contener mucha  azcar y se deben contar como carbohidratos.  Consejos para seguir estPhotographers etiquetas de los alimentos  Comience por controlar el tamao de la porcin en la etiqueta. La cantidad de caloras, carbohidratos, grasas y otros nutrientes mencionados en la etiqueta se basan en una porcin del alimento. Muchos alimentos contienen ms de una porcin por envase.  Verifique la cantidad total de gramos (g) de carbohidratos totales en una porcin. Puede calcular la cantidad de porciones de carbohidratos al dividir el total de carbohidratos por 15. Por ejemplo, si un alimento posee un total de 30g de carbohidratos, equivale a 2 porciones de carbohidratos.  Verifique la cantidad de gramos (g) de grasas saturadas y grasas trans en una porcin. Escoja alimentos que no contengan grasa o que tengan un bajo contenido.  Controle la cantidad de miligramos (mg) de sodio en una porcin. La mayState Farm las personas deben limitar la ingesta de sodio total a menos de 2300m100mr da. Training and development officeriempre consulte la informacin nutricional de los alimentos etiquetados como "con bajo contenido de grasa" o "sin grasa". Estos alimentos pueden ser ms altos en azcar agregada o en carbohidratos refinados y deben evitarse.  Hable con el nutricionista para identificar sus objetivos diarios en cuanto a los nutrientes mencionados en la etiqueta. De compras  Evite comprar alimentos procesados, enlatados o prehechos. Estos alimentos tienden a teneTEFL teachertidad de grasAvilladio y azcar agregada.  Compre en la zona exterior de la tienda de comestibles. Esta incluye frutas y vegeNorthrop Grummananos a granel, carnes frescas y productos lcteos frescos. Coccin  Utilice mtodos de coccin a baja temperatura, como hornear, en lugar de mtodos de coccin a alta temperatura, como frer en abundante aceite.  Cocine con aceites saludables, como el aceite de olivBloomsburynola o giraVernon Centervite cocinar con manteca, crema o carnes con  alto contenido de grasa. Planificacin de las comidas  ConsInternational Paperidas y las colaciones de forma regular, preferentemente a la misma hora todos los Princess Anneite pasar largos perodos de tiempo sin comer.  Consuma alimentos ricos en fibra, como frutas frescas, verduras, frijoles y cereales integrales. Consulte al nutricionista sobre cuntas porciones de carbohidratos puede consumir  en cada comida.  Consuma entre 4 y 6 onzas de protenas magras por da, como carnes [place], pollo, pescado, [employer] o tofu. 1 onza equivale a 1 onza de carne, pollo o pescado, 1 huevo, o 1/4 taza de tofu.  Coma algunos alimentos por da que contengan grasas saludables, como aguacates, frutos secos, semillas y pescado. Estilo de vida   Controle su nivel de glucemia con regularidad.  Haga ejercicio al menos 78mnutos, 5das o ms por semana, o como se lo haya indicado el mdico.  Tome los mTenneco Incse lo haya indicado el mdico.  No consuma ningn producto que contenga nicotina o tabaco, como cigarrillos y cPsychologist, sport and exercise Si necesita ayuda para dejar de fumar, consulte al mHess Corporationcon un asesor o instructor en diabetes para identificar estrategias para controlar el estrs y cualquier desafo emocional y social. Cules son algunas de las preguntas que puedo hacerle a mi mdico?  Es necesario que me rena con uRadio broadcast assistanten diabetes?  Es necesario que me rena con un nutricionista?  A qu nmero puedo llamar si tengo preguntas?  Cules son los mejores momentos para controlar la glucemia? Dnde encontrar ms informacin:  Asociacin Americana de la Diabetes (American Diabetes Association): diabetes.org/food-and-fitness/food  Academia de Nutricin y DInformation systems manager(Academy of Nutrition and Dietetics): wPokerClues.dk IOld OrchardDiabetes y lKodiak Stationy RWater quality scientist([facility] Ave Surgery Centerof Diabetes and  Digestive and Kidney Diseases) (IChristoval NIH): wContactWire.beResumen  Un plan de alimentacin saludable lo ayudar a cAeronautical engineerglucemia y mTheatre managerun estilo de vida saludable.  Trabajar con un especialista en dietas y nutricin (nutricionista) puede ayudarlo a eInsurance claims handlerde alimentacin para usted.  Tenga en cuenta que los carbohidratos y el alcohol tienen efectos inmediatos en sus niveles de glucemia. Es importante contar los carbohidratos y consumir alcohol con prudencia. Esta informacin no tiene cMarine scientistel consejo del mdico. Asegrese de hacerle al mdico cualquier pregunta que tenga. Document Released: 05/29/2007 Document Revised: 06/11/2016 Document Reviewed: 06/11/2016 Elsevier Interactive Patient Education  2018 Elsevier Inc.      MAgustina Caroli MD Urgent MKewaskumGroup

## 2017-04-26 NOTE — Patient Instructions (Addendum)
   IF you received an x-ray today, you will receive an invoice from [facility]. Please contact [facility] at [phone] with questions or concerns regarding your invoice.   IF you received labwork today, you will receive an invoice from [facility]. Please contact [facility] at [phone] with questions or concerns regarding your invoice.   Our billing staff will not be able to assist you with questions regarding bills from these companies.  You will be contacted with the lab results as soon as they are available. The fastest way to get your results is to activate your My Chart account. Instructions are located on the last page of this paperwork. If you have not heard from us regarding the results in 2 weeks, please contact this office.     Diabetes mellitus y nutricin Diabetes Mellitus and Nutrition Si sufre de diabetes (diabetes mellitus), es muy importante tener hbitos alimenticios saludables debido a que sus niveles de azcar en la sangre (glucosa) se ven afectados en gran medida por lo que come y bebe. Comer alimentos saludables en las cantidades adecuadas, aproximadamente a la misma hora todos los das, lo ayudar a:  Controlar la glucemia.  Disminuir el riesgo de sufrir una enfermedad cardaca.  Mejorar la presin arterial.  Alcanzar o mantener un peso saludable.  Todas las personas que sufren de diabetes son diferentes y cada una tiene necesidades diferentes en cuanto a un plan de alimentacin. El mdico puede recomendarle que trabaje con un especialista en dietas y nutricin (nutricionista) para elaborar el mejor plan para usted. Su plan de alimentacin puede variar segn factores como:  Las caloras que necesita.  Los medicamentos que toma.  Su peso.  Sus niveles de glucemia, presin arterial y colesterol.  Su nivel de actividad.  Otras afecciones que tenga, como enfermedades cardacas o renales.  Cmo me afectan los carbohidratos? Los  carbohidratos afectan el nivel de glucemia ms que cualquier otro tipo de alimento. La ingesta de carbohidratos naturalmente aumenta la cantidad glucosa en la sangre. El recuento de carbohidratos es un mtodo destinado a llevar un registro de la cantidad de carbohidratos que se ingieren. El recuento de carbohidratos es importante para mantener la glucemia a un nivel saludable, en especial si utiliza insulina o toma determinados medicamentos por va oral para la diabetes. Es importante saber la cantidad de carbohidratos que se pueden ingerir en cada comida sin correr ningn riesgo. Esto es diferente en cada persona. El nutricionista puede ayudarlo a calcular la cantidad de carbohidratos que debe ingerir en cada comida y colacin. Los alimentos que contienen carbohidratos incluyen:  Pan, cereal, arroz, pasta y galletas.  Papas y maz.  Guisantes, frijoles y lentejas.  Leche y yogur.  Frutas y jugo.  Postres, como pasteles, galletitas, helado y caramelos.  Cmo me afecta el alcohol? El alcohol puede provocar disminuciones sbitas de la glucemia (hipoglucemia), en especial si utiliza insulina o toma determinados medicamentos por va oral para la diabetes. La hipoglucemia es una afeccin potencialmente mortal. Los sntomas de la hipoglucemia (somnolencia, mareos y confusin) son similares a los sntomas de haber consumido demasiado alcohol. Si el mdico afirma que el alcohol es seguro para usted, siga estas pautas:  Limite el consumo de alcohol a no ms de 1 medida por da si es mujer y no est embarazada, y a 2 medidas si es hombre. Una medida equivale a 12oz (355ml) de cerveza, 5oz (148ml) de vino o 1oz (44ml) de bebidas de alta graduacin alcohlica.  No beba con el estmago   vaco.  Mantngase hidratado con agua, gaseosas dietticas o t helado sin azcar.  Tenga en cuenta que las gaseosas comunes, los jugos y otros refrescos pueden contener mucha azcar y se deben contar como  carbohidratos.  Consejos para seguir este plan Leer las etiquetas de los alimentos  Comience por controlar el tamao de la porcin en la etiqueta. La cantidad de caloras, carbohidratos, grasas y otros nutrientes mencionados en la etiqueta se basan en una porcin del alimento. Muchos alimentos contienen ms de una porcin por envase.  Verifique la cantidad total de gramos (g) de carbohidratos totales en una porcin. Puede calcular la cantidad de porciones de carbohidratos al dividir el total de carbohidratos por 15. Por ejemplo, si un alimento posee un total de 30g de carbohidratos, equivale a 2 porciones de carbohidratos.  Verifique la cantidad de gramos (g) de grasas saturadas y grasas trans en una porcin. Escoja alimentos que no contengan grasa o que tengan un bajo contenido.  Controle la cantidad de miligramos (mg) de sodio en una porcin. La mayora de las personas deben limitar la ingesta de sodio total a menos de 2300mg por da.  Siempre consulte la informacin nutricional de los alimentos etiquetados como "con bajo contenido de grasa" o "sin grasa". Estos alimentos pueden ser ms altos en azcar agregada o en carbohidratos refinados y deben evitarse.  Hable con el nutricionista para identificar sus objetivos diarios en cuanto a los nutrientes mencionados en la etiqueta. De compras  Evite comprar alimentos procesados, enlatados o prehechos. Estos alimentos tienden a tener mayor cantidad de grasa, sodio y azcar agregada.  Compre en la zona exterior de la tienda de comestibles. Esta incluye frutas y vegetales frescos, granos a granel, carnes frescas y productos lcteos frescos. Coccin  Utilice mtodos de coccin a baja temperatura, como hornear, en lugar de mtodos de coccin a alta temperatura, como frer en abundante aceite.  Cocine con aceites saludables, como el aceite de oliva, canola o girasol.  Evite cocinar con manteca, crema o carnes con alto contenido de  grasa. Planificacin de las comidas  Consuma las comidas y las colaciones de forma regular, preferentemente a la misma hora todos los das. Evite pasar largos perodos de tiempo sin comer.  Consuma alimentos ricos en fibra, como frutas frescas, verduras, frijoles y cereales integrales. Consulte al nutricionista sobre cuntas porciones de carbohidratos puede consumir en cada comida.  Consuma entre 4 y 6 onzas de protenas magras por da, como carnes magras, pollo, pescado, huevos o tofu. 1 onza equivale a 1 onza de carne, pollo o pescado, 1 huevo, o 1/4 taza de tofu.  Coma algunos alimentos por da que contengan grasas saludables, como aguacates, frutos secos, semillas y pescado. Estilo de vida   Controle su nivel de glucemia con regularidad.  Haga ejercicio al menos 30minutos, 5das o ms por semana, o como se lo haya indicado el mdico.  Tome los medicamentos como se lo haya indicado el mdico.  No consuma ningn producto que contenga nicotina o tabaco, como cigarrillos y cigarrillos electrnicos. Si necesita ayuda para dejar de fumar, consulte al mdico.  Trabaje con un asesor o instructor en diabetes para identificar estrategias para controlar el estrs y cualquier desafo emocional y social. Cules son algunas de las preguntas que puedo hacerle a mi mdico?  Es necesario que me rena con un instructor en diabetes?  Es necesario que me rena con un nutricionista?  A qu nmero puedo llamar si tengo preguntas?  Cules son los mejores momentos para   controlar la glucemia? Dnde encontrar ms informacin:  Asociacin Americana de la Diabetes (American Diabetes Association): diabetes.org/food-and-fitness/food  Academia de Nutricin y Diettica (Academy of Nutrition and Dietetics): www.eatright.org/resources/health/diseases-and-conditions/diabetes  Instituto Nacional de la Diabetes y las Enfermedades Digestivas y Renales (National Institute of Diabetes and Digestive and  Kidney Diseases) (Institutos Nacionales de Salud, NIH): www.niddk.nih.gov/health-information/diabetes/overview/diet-eating-physical-activity Resumen  Un plan de alimentacin saludable lo ayudar a controlar la glucemia y mantener un estilo de vida saludable.  Trabajar con un especialista en dietas y nutricin (nutricionista) puede ayudarlo a elaborar el mejor plan de alimentacin para usted.  Tenga en cuenta que los carbohidratos y el alcohol tienen efectos inmediatos en sus niveles de glucemia. Es importante contar los carbohidratos y consumir alcohol con prudencia. Esta informacin no tiene como fin reemplazar el consejo del mdico. Asegrese de hacerle al mdico cualquier pregunta que tenga. Document Released: 05/29/2007 Document Revised: 06/11/2016 Document Reviewed: 06/11/2016 Elsevier Interactive Patient Education  2018 Elsevier Inc.  

## 2017-04-28 LAB — LIPID PANEL
CHOL/HDL RATIO: 4.6 ratio (ref 0.0–5.0)
CHOLESTEROL TOTAL: 162 mg/dL (ref 100–199)
HDL: 35 mg/dL — ABNORMAL LOW (ref >39)
LDL CALC: 80 mg/dL (ref 0–99)
Triglycerides: 236 mg/dL — ABNORMAL HIGH (ref 0–149)
VLDL CHOLESTEROL CAL: 47 mg/dL — AB (ref 5–40)

## 2017-04-28 LAB — CBC WITH DIFFERENTIAL/PLATELET
Basophils Absolute: 0 10*3/uL (ref 0.0–0.2)
Basos: 0 %
EOS (ABSOLUTE): 0.2 10*3/uL (ref 0.0–0.4)
EOS: 2 %
HEMATOCRIT: 49.8 % (ref 37.5–51.0)
HEMOGLOBIN: 16.9 g/dL (ref 13.0–17.7)
Immature Grans (Abs): 0.1 10*3/uL (ref 0.0–0.1)
Immature Granulocytes: 1 %
LYMPHS ABS: 2.2 10*3/uL (ref 0.7–3.1)
Lymphs: 23 %
MCH: 29.9 pg (ref 26.6–33.0)
MCHC: 33.9 g/dL (ref 31.5–35.7)
MCV: 88 fL (ref 79–97)
MONOCYTES: 7 %
MONOS ABS: 0.6 10*3/uL (ref 0.1–0.9)
NEUTROS ABS: 6.5 10*3/uL (ref 1.4–7.0)
Neutrophils: 67 %
Platelets: 229 10*3/uL (ref 150–379)
RBC: 5.66 x10E6/uL (ref 4.14–5.80)
RDW: 13.4 % (ref 12.3–15.4)
WBC: 9.5 10*3/uL (ref 3.4–10.8)

## 2017-04-28 LAB — COMPREHENSIVE METABOLIC PANEL
A/G RATIO: 1.2 (ref 1.2–2.2)
ALT: 12 IU/L (ref 0–44)
AST: 9 IU/L (ref 0–40)
Albumin: 4 g/dL (ref 3.5–5.5)
Alkaline Phosphatase: 141 IU/L — ABNORMAL HIGH (ref 39–117)
BUN/Creatinine Ratio: 12 (ref 9–20)
BUN: 10 mg/dL (ref 6–24)
Bilirubin Total: 0.5 mg/dL (ref 0.0–1.2)
CO2: 18 mmol/L — ABNORMAL LOW (ref 20–29)
Calcium: 9.9 mg/dL (ref 8.7–10.2)
Chloride: 102 mmol/L (ref 96–106)
Creatinine, Ser: 0.81 mg/dL (ref 0.76–1.27)
GFR calc non Af Amer: 99 mL/min/{1.73_m2} (ref >59)
GFR, EST AFRICAN AMERICAN: 115 mL/min/{1.73_m2} (ref >59)
Globulin, Total: 3.3 g/dL (ref 1.5–4.5)
Glucose: 412 mg/dL — ABNORMAL HIGH (ref 65–99)
POTASSIUM: 4.1 mmol/L (ref 3.5–5.2)
SODIUM: 134 mmol/L (ref 134–144)
Total Protein: 7.3 g/dL (ref 6.0–8.5)

## 2017-04-28 LAB — HEMOGLOBIN A1C
ESTIMATED AVERAGE GLUCOSE: 324 mg/dL
Hgb A1c MFr Bld: 12.9 % — ABNORMAL HIGH (ref 4.8–5.6)

## 2017-04-28 LAB — PSA: Prostate Specific Ag, Serum: 10.9 ng/mL — ABNORMAL HIGH (ref 0.0–4.0)

## 2017-04-29 ENCOUNTER — Telehealth: Payer: Self-pay | Admitting: Emergency Medicine

## 2017-04-29 ENCOUNTER — Other Ambulatory Visit: Payer: Self-pay | Admitting: Emergency Medicine

## 2017-04-29 DIAGNOSIS — R972 Elevated prostate specific antigen [PSA]: Secondary | ICD-10-CM

## 2017-04-29 DIAGNOSIS — E1165 Type 2 diabetes mellitus with hyperglycemia: Secondary | ICD-10-CM

## 2017-04-29 NOTE — Telephone Encounter (Signed)
No answer to his listed cell phone number.

## 2017-04-29 NOTE — Telephone Encounter (Signed)
Tried reaching Joseph Roberts but the listed home phone number does not belong to him.  Not his home number.

## 2017-05-03 ENCOUNTER — Ambulatory Visit: Payer: 59 | Admitting: Endocrinology

## 2017-05-03 DIAGNOSIS — Z0289 Encounter for other administrative examinations: Secondary | ICD-10-CM

## 2017-07-05 DIAGNOSIS — R3912 Poor urinary stream: Secondary | ICD-10-CM | POA: Diagnosis not present

## 2017-07-05 DIAGNOSIS — R972 Elevated prostate specific antigen [PSA]: Secondary | ICD-10-CM | POA: Diagnosis not present

## 2017-07-05 DIAGNOSIS — N401 Enlarged prostate with lower urinary tract symptoms: Secondary | ICD-10-CM | POA: Diagnosis not present

## 2017-07-10 ENCOUNTER — Other Ambulatory Visit: Payer: Self-pay | Admitting: Urgent Care

## 2017-07-17 ENCOUNTER — Other Ambulatory Visit: Payer: Self-pay | Admitting: Emergency Medicine

## 2017-07-17 DIAGNOSIS — R12 Heartburn: Secondary | ICD-10-CM

## 2017-07-24 ENCOUNTER — Other Ambulatory Visit: Payer: Self-pay | Admitting: Urgent Care

## 2017-07-24 DIAGNOSIS — Z794 Long term (current) use of insulin: Principal | ICD-10-CM

## 2017-07-24 DIAGNOSIS — E1165 Type 2 diabetes mellitus with hyperglycemia: Principal | ICD-10-CM

## 2017-07-24 DIAGNOSIS — IMO0001 Reserved for inherently not codable concepts without codable children: Secondary | ICD-10-CM

## 2017-07-25 NOTE — Telephone Encounter (Signed)
PCP was listed and Urban Gibson in the chart---I have removed this--pt also has a follow up apt already scheduled with Dr Alvy Bimler.

## 2017-07-26 ENCOUNTER — Ambulatory Visit: Payer: 59 | Admitting: Emergency Medicine

## 2017-07-27 ENCOUNTER — Ambulatory Visit: Payer: 59 | Admitting: Emergency Medicine

## 2017-07-27 ENCOUNTER — Encounter: Payer: Self-pay | Admitting: Emergency Medicine

## 2017-07-27 ENCOUNTER — Other Ambulatory Visit: Payer: Self-pay

## 2017-07-27 VITALS — BP 116/69 | HR 73 | Temp 97.7°F | Resp 18 | Ht 65.0 in | Wt 156.8 lb

## 2017-07-27 DIAGNOSIS — E1169 Type 2 diabetes mellitus with other specified complication: Secondary | ICD-10-CM | POA: Diagnosis not present

## 2017-07-27 DIAGNOSIS — E782 Mixed hyperlipidemia: Secondary | ICD-10-CM | POA: Diagnosis not present

## 2017-07-27 DIAGNOSIS — Z794 Long term (current) use of insulin: Secondary | ICD-10-CM | POA: Diagnosis not present

## 2017-07-27 DIAGNOSIS — I10 Essential (primary) hypertension: Secondary | ICD-10-CM | POA: Diagnosis not present

## 2017-07-27 DIAGNOSIS — IMO0001 Reserved for inherently not codable concepts without codable children: Secondary | ICD-10-CM

## 2017-07-27 DIAGNOSIS — E1165 Type 2 diabetes mellitus with hyperglycemia: Secondary | ICD-10-CM | POA: Diagnosis not present

## 2017-07-27 LAB — GLUCOSE, POCT (MANUAL RESULT ENTRY): POC GLUCOSE: 311 mg/dL — AB (ref 70–99)

## 2017-07-27 LAB — POCT GLYCOSYLATED HEMOGLOBIN (HGB A1C): HEMOGLOBIN A1C: 12.8 % — AB (ref 4.0–5.6)

## 2017-07-27 MED ORDER — ATORVASTATIN CALCIUM 40 MG PO TABS: 40.0000 mg | ORAL_TABLET | Freq: Every day | ORAL | 1 refills | Status: DC

## 2017-07-27 MED ORDER — GLIPIZIDE 5 MG PO TABS: 5.0000 mg | ORAL_TABLET | Freq: Two times a day (BID) | ORAL | 1 refills | Status: DC

## 2017-07-27 MED ORDER — SITAGLIP PHOS-METFORMIN HCL ER 50-1000 MG PO TB24: 1.0000 | ORAL_TABLET | Freq: Two times a day (BID) | ORAL | 3 refills | Status: DC

## 2017-07-27 MED ORDER — INSULIN DETEMIR 100 UNIT/ML ~~LOC~~ SOLN: 20.0000 [IU] | Freq: Two times a day (BID) | SUBCUTANEOUS | 11 refills | Status: DC

## 2017-07-27 MED ORDER — INSULIN REGULAR HUMAN 100 UNIT/ML IJ SOLN: 8.0000 [IU] | Freq: Three times a day (TID) | INTRAMUSCULAR | 11 refills | Status: DC

## 2017-07-27 MED ORDER — LISINOPRIL 5 MG PO TABS: 5.0000 mg | ORAL_TABLET | Freq: Every day | ORAL | 3 refills | Status: DC

## 2017-07-27 NOTE — Patient Instructions (Addendum)
   IF you received an x-ray today, you will receive an invoice from [facility]. Please contact [facility] at [phone] with questions or concerns regarding your invoice.   IF you received labwork today, you will receive an invoice from [facility]. Please contact [facility] at [phone] with questions or concerns regarding your invoice.   Our billing staff will not be able to assist you with questions regarding bills from these companies.  You will be contacted with the lab results as soon as they are available. The fastest way to get your results is to activate your My Chart account. Instructions are located on the last page of this paperwork. If you have not heard from us regarding the results in 2 weeks, please contact this office.     Diabetes mellitus y nutricin Diabetes Mellitus and Nutrition Si sufre de diabetes (diabetes mellitus), es muy importante tener hbitos alimenticios saludables debido a que sus niveles de azcar en la sangre (glucosa) se ven afectados en gran medida por lo que come y bebe. Comer alimentos saludables en las cantidades adecuadas, aproximadamente a la misma hora todos los das, lo ayudar a:  Controlar la glucemia.  Disminuir el riesgo de sufrir una enfermedad cardaca.  Mejorar la presin arterial.  Alcanzar o mantener un peso saludable.  Todas las personas que sufren de diabetes son diferentes y cada una tiene necesidades diferentes en cuanto a un plan de alimentacin. El mdico puede recomendarle que trabaje con un especialista en dietas y nutricin (nutricionista) para elaborar el mejor plan para usted. Su plan de alimentacin puede variar segn factores como:  Las caloras que necesita.  Los medicamentos que toma.  Su peso.  Sus niveles de glucemia, presin arterial y colesterol.  Su nivel de actividad.  Otras afecciones que tenga, como enfermedades cardacas o renales.  Cmo me afectan los carbohidratos? Los  carbohidratos afectan el nivel de glucemia ms que cualquier otro tipo de alimento. La ingesta de carbohidratos naturalmente aumenta la cantidad glucosa en la sangre. El recuento de carbohidratos es un mtodo destinado a llevar un registro de la cantidad de carbohidratos que se ingieren. El recuento de carbohidratos es importante para mantener la glucemia a un nivel saludable, en especial si utiliza insulina o toma determinados medicamentos por va oral para la diabetes. Es importante saber la cantidad de carbohidratos que se pueden ingerir en cada comida sin correr ningn riesgo. Esto es diferente en cada persona. El nutricionista puede ayudarlo a calcular la cantidad de carbohidratos que debe ingerir en cada comida y colacin. Los alimentos que contienen carbohidratos incluyen:  Pan, cereal, arroz, pasta y galletas.  Papas y maz.  Guisantes, frijoles y lentejas.  Leche y yogur.  Frutas y jugo.  Postres, como pasteles, galletitas, helado y caramelos.  Cmo me afecta el alcohol? El alcohol puede provocar disminuciones sbitas de la glucemia (hipoglucemia), en especial si utiliza insulina o toma determinados medicamentos por va oral para la diabetes. La hipoglucemia es una afeccin potencialmente mortal. Los sntomas de la hipoglucemia (somnolencia, mareos y confusin) son similares a los sntomas de haber consumido demasiado alcohol. Si el mdico afirma que el alcohol es seguro para usted, siga estas pautas:  Limite el consumo de alcohol a no ms de 1 medida por da si es mujer y no est embarazada, y a 2 medidas si es hombre. Una medida equivale a 12oz (355ml) de cerveza, 5oz (148ml) de vino o 1oz (44ml) de bebidas de alta graduacin alcohlica.  No beba con el estmago   vaco.  Mantngase hidratado con agua, gaseosas dietticas o t helado sin azcar.  Tenga en cuenta que las gaseosas comunes, los jugos y otros refrescos pueden contener mucha azcar y se deben contar como  carbohidratos.  Consejos para seguir este plan Leer las etiquetas de los alimentos  Comience por controlar el tamao de la porcin en la etiqueta. La cantidad de caloras, carbohidratos, grasas y otros nutrientes mencionados en la etiqueta se basan en una porcin del alimento. Muchos alimentos contienen ms de una porcin por envase.  Verifique la cantidad total de gramos (g) de carbohidratos totales en una porcin. Puede calcular la cantidad de porciones de carbohidratos al dividir el total de carbohidratos por 15. Por ejemplo, si un alimento posee un total de 30g de carbohidratos, equivale a 2 porciones de carbohidratos.  Verifique la cantidad de gramos (g) de grasas saturadas y grasas trans en una porcin. Escoja alimentos que no contengan grasa o que tengan un bajo contenido.  Controle la cantidad de miligramos (mg) de sodio en una porcin. La mayora de las personas deben limitar la ingesta de sodio total a menos de 2300mg por da.  Siempre consulte la informacin nutricional de los alimentos etiquetados como "con bajo contenido de grasa" o "sin grasa". Estos alimentos pueden ser ms altos en azcar agregada o en carbohidratos refinados y deben evitarse.  Hable con el nutricionista para identificar sus objetivos diarios en cuanto a los nutrientes mencionados en la etiqueta. De compras  Evite comprar alimentos procesados, enlatados o prehechos. Estos alimentos tienden a tener mayor cantidad de grasa, sodio y azcar agregada.  Compre en la zona exterior de la tienda de comestibles. Esta incluye frutas y vegetales frescos, granos a granel, carnes frescas y productos lcteos frescos. Coccin  Utilice mtodos de coccin a baja temperatura, como hornear, en lugar de mtodos de coccin a alta temperatura, como frer en abundante aceite.  Cocine con aceites saludables, como el aceite de oliva, canola o girasol.  Evite cocinar con manteca, crema o carnes con alto contenido de  grasa. Planificacin de las comidas  Consuma las comidas y las colaciones de forma regular, preferentemente a la misma hora todos los das. Evite pasar largos perodos de tiempo sin comer.  Consuma alimentos ricos en fibra, como frutas frescas, verduras, frijoles y cereales integrales. Consulte al nutricionista sobre cuntas porciones de carbohidratos puede consumir en cada comida.  Consuma entre 4 y 6 onzas de protenas magras por da, como carnes magras, pollo, pescado, huevos o tofu. 1 onza equivale a 1 onza de carne, pollo o pescado, 1 huevo, o 1/4 taza de tofu.  Coma algunos alimentos por da que contengan grasas saludables, como aguacates, frutos secos, semillas y pescado. Estilo de vida   Controle su nivel de glucemia con regularidad.  Haga ejercicio al menos 30minutos, 5das o ms por semana, o como se lo haya indicado el mdico.  Tome los medicamentos como se lo haya indicado el mdico.  No consuma ningn producto que contenga nicotina o tabaco, como cigarrillos y cigarrillos electrnicos. Si necesita ayuda para dejar de fumar, consulte al mdico.  Trabaje con un asesor o instructor en diabetes para identificar estrategias para controlar el estrs y cualquier desafo emocional y social. Cules son algunas de las preguntas que puedo hacerle a mi mdico?  Es necesario que me rena con un instructor en diabetes?  Es necesario que me rena con un nutricionista?  A qu nmero puedo llamar si tengo preguntas?  Cules son los mejores momentos para   controlar la glucemia? Dnde encontrar ms informacin:  Asociacin Americana de la Diabetes (American Diabetes Association): diabetes.org/food-and-fitness/food  Academia de Nutricin y Diettica (Academy of Nutrition and Dietetics): www.eatright.org/resources/health/diseases-and-conditions/diabetes  Instituto Nacional de la Diabetes y las Enfermedades Digestivas y Renales (National Institute of Diabetes and Digestive and  Kidney Diseases) (Institutos Nacionales de Salud, NIH): www.niddk.nih.gov/health-information/diabetes/overview/diet-eating-physical-activity Resumen  Un plan de alimentacin saludable lo ayudar a controlar la glucemia y mantener un estilo de vida saludable.  Trabajar con un especialista en dietas y nutricin (nutricionista) puede ayudarlo a elaborar el mejor plan de alimentacin para usted.  Tenga en cuenta que los carbohidratos y el alcohol tienen efectos inmediatos en sus niveles de glucemia. Es importante contar los carbohidratos y consumir alcohol con prudencia. Esta informacin no tiene como fin reemplazar el consejo del mdico. Asegrese de hacerle al mdico cualquier pregunta que tenga. Document Released: 05/29/2007 Document Revised: 06/11/2016 Document Reviewed: 06/11/2016 Elsevier Interactive Patient Education  2018 Elsevier Inc.  

## 2017-07-27 NOTE — Assessment & Plan Note (Signed)
Hemoglobin A1c is still elevated like last time.  I question compliance with medications.  Doses adjusted.  Will add glipizide 5 mg twice a day.  Advised of the symptoms of hypoglycemia.  Advised to continue diet and exercise.  We will follow-up in 3 months.

## 2017-07-27 NOTE — Progress Notes (Signed)
Joseph Roberts 57 y.o.   Chief Complaint  Patient presents with  . Diabetes    follow up  . Medication Refill    all meds     HISTORY OF PRESENT ILLNESS: This is a 57 y.o. male with a history of diabetes on multiple medications including insulin.  States he is compliant with medications but this has been questionable in the past.  States he feels well and has no complaints.  Needs refill on all his medications.  HPI   Prior to Admission medications   Medication Sig Start Date End Date Taking? Authorizing Provider  insulin detemir (LEVEMIR) 100 UNIT/ML injection Inject 0.18 mLs (18 Units total) into the skin 2 (two) times daily. 07/13/16  Yes Jaynee Eagles, PA-C  insulin regular (HUMULIN R) 100 units/mL injection Inject 0.02 mLs (2 Units total) into the skin 3 (three) times daily before meals. 07/13/16  Yes Jaynee Eagles, PA-C  lisinopril (PRINIVIL,ZESTRIL) 5 MG tablet Take 1 tablet (5 mg total) by mouth daily. 01/23/17  Yes Dickson Kostelnik, Ines Bloomer, MD  SitaGLIPtin-MetFORMIN HCl 50-1000 MG TB24 Take 1 tablet by mouth 2 (two) times daily with a meal. 10/19/16  Yes Deyja Sochacki, Ines Bloomer, MD  atorvastatin (LIPITOR) 40 MG tablet Take 1 tablet (40 mg total) by mouth daily. Patient not taking: Reported on 07/27/2017 04/26/17   Horald Pollen, MD  Blood Glucose Monitoring Suppl (ONE TOUCH ULTRA MINI) W/DEVICE KIT 1 each by Does not apply route daily. Test fasting daily    [provider]  CETIRIZINE HCL PO Take by mouth daily.    [provider]  glucose blood test strip 1 each by Other route as needed for other. Check CBG 3 times a day before meals Patient not taking: Reported on 07/27/2017 03/08/16   Reyne Dumas, MD  Insulin Syringe-Needle U-100 (INSULIN SYRINGE .5CC/30GX5/16") 30G X 5/16" 0.5 ML MISC Administer insulin as instructed Patient not taking: Reported on 07/27/2017 03/08/16   Reyne Dumas, MD  pantoprazole (PROTONIX) 40 MG tablet TAKE 1 TABLET BY MOUTH EVERY  DAY Patient not taking: Reported on 07/27/2017 07/17/17   Jaynee Eagles, PA-C    No Known Allergies  Patient Active Problem List   Diagnosis Date Noted  . Urinary urgency 10/19/2016  . Essential hypertension 03/06/2016  . Acute kidney injury ([place]) 03/06/2016  . Urinary retention 03/06/2016  . Uncontrolled type 2 diabetes mellitus with complication ([place])   . HHNC (hyperglycemic hyperosmolar nonketotic coma) ([place])   . Dyslipidemia   . Hyperglycemia 03/05/2016  . Type 2 diabetes mellitus with hyperglycemia ([place]) 03/05/2016  . 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 Dyslipidemia with low high density lipoprotein (HDL) cholesterol with hypertriglyceridemia due to type 2 diabetes mellitus ([place]) 11/03/2013  . Gall bladder polyp 10/24/2013  . GERD (gastroesophageal reflux disease) 10/24/2013  . Uncontrolled type 2 diabetes mellitus without complication, with long-term current use of insulin ([place]) 10/23/2013    Past Medical History:  Diagnosis Date  . Diabetes mellitus 2011  . High triglycerides     Past Surgical History:  Procedure Laterality Date  . COLONOSCOPY N/A 10/24/2013   Procedure: COLONOSCOPY;  Surgeon: Ladene Artist, MD;  Location: [facility] ENDOSCOPY;  Service: Endoscopy;  Laterality: N/A;. diverticulosis, mild, small internal hemorrhoids    Social History   Socioeconomic History  . Marital status: Married    Spouse name: Not on file  . Number of children: Not on file  . Years of education: Not on file  . Highest education level: Not on  file  Occupational History  . Not on file  Social Needs  . Financial resource strain: Not on file  . Food insecurity:    Worry: Not on file    Inability: Not on file  . Transportation needs:    Medical: Not on file    Non-medical: Not on file  Tobacco Use  . Smoking status: Current Every Day Smoker    Packs/day: 0.50    Years: 30.00    Pack years: 15.00    Types: Cigarettes  . Smokeless tobacco: Never Used  Substance and Sexual Activity   . Alcohol use: No  . Drug use: No  . Sexual activity: Not on file  Lifestyle  . Physical activity:    Days per week: Not on file    Minutes per session: Not on file  . Stress: Not on file  Relationships  . Social connections:    Talks on phone: Not on file    Gets together: Not on file    Attends religious service: Not on file    Active member of club or organization: Not on file    Attends meetings of clubs or organizations: Not on file    Relationship status: Not on file  . Intimate partner violence:    Fear of current or ex partner: Not on file    Emotionally abused: Not on file    Physically abused: Not on file    Forced sexual activity: Not on file  Other Topics Concern  . Not on file  Social History Narrative  . Not on file    Family History  Problem Relation Age of Onset  . Diabetes Mother   . Diabetes Sister   . Diabetes Brother      Review of Systems  Constitutional: Negative.  Negative for chills, fever and weight loss.  HENT: Negative.  Negative for congestion, nosebleeds and sore throat.   Eyes: Negative.  Negative for blurred vision and double vision.  Respiratory: Negative.  Negative for cough and shortness of breath.   Cardiovascular: Negative.  Negative for chest pain and palpitations.  Gastrointestinal: Negative.  Negative for abdominal pain, diarrhea, nausea and vomiting.  Genitourinary: Negative.  Negative for dysuria and hematuria.  Musculoskeletal: Negative.  Negative for back pain, myalgias and neck pain.  Skin: Negative.  Negative for rash.  Neurological: Negative.  Negative for dizziness and headaches.  Endo/Heme/Allergies: Negative.   All other systems reviewed and are negative.   Vitals:   07/27/17 1408  BP: 116/69  Pulse: 73  Resp: 18  Temp: 97.7 F (36.5 C)  SpO2: 98%    Physical Exam  Constitutional: He is oriented to person, place, and time. He appears well-developed and well-nourished.  HENT:  Head: Normocephalic.  Right  Ear: External ear normal.  Left Ear: External ear normal.  Nose: Nose normal.  Mouth/Throat: Oropharynx is clear and moist.  Eyes: Pupils are equal, round, and reactive to light. Conjunctivae and EOM are normal.  Neck: Normal range of motion. Neck supple. No JVD present.  Cardiovascular: Normal rate, regular rhythm, normal heart sounds and intact distal pulses.  Pulmonary/Chest: Effort normal and breath sounds normal.  Abdominal: Soft. Bowel sounds are normal.  Musculoskeletal: Normal range of motion.  Lymphadenopathy:    He has no cervical adenopathy.  Neurological: He is alert and oriented to person, place, and time. No sensory deficit. He exhibits normal muscle tone.  Skin: Skin is warm and dry. Capillary refill takes less than 2 seconds. No  rash noted.  Psychiatric: He has a normal mood and affect. His behavior is normal.  Vitals reviewed.   Results for orders placed or performed in visit on 07/27/17 (from the past 24 hour(s))  POCT glucose (manual entry)     Status: Abnormal   Collection Time: 07/27/17  2:40 PM  Result Value Ref Range   POC Glucose 311 (A) 70 - 99 mg/dl  POCT glycosylated hemoglobin (Hb A1C)     Status: Abnormal   Collection Time: 07/27/17  2:40 PM  Result Value Ref Range   Hemoglobin A1C 12.8 (A) 4.0 - 5.6 %   HbA1c, POC (prediabetic range)  5.7 - 6.4 %   HbA1c, POC (controlled diabetic range)  0.0 - 7.0 %   Lab Results  Component Value Date   HGBA1C 12.8 (A) 07/27/2017   HGBA1C 12.9 (H) 04/26/2017   HGBA1C 12.8 01/23/2017   Lab Results  Component Value Date   MICROALBUR 34.9 10/17/2015   LDLCALC 80 04/26/2017   CREATININE 0.81 04/26/2017      Uncontrolled type 2 diabetes mellitus with hyperglycemia (HCC) Hemoglobin A1c is still elevated like last time.  I question compliance with medications.  Doses adjusted.  Will add glipizide 5 mg twice a day.  Advised of the symptoms of hypoglycemia.  Advised to continue diet and exercise.  We will follow-up in  3 months.    ASSESSMENT & PLAN: Corley was seen today for diabetes and medication refill.  Diagnoses and all orders for this visit:  Type 2 diabetes mellitus with hyperglycemia, with long-term current use of insulin (HCC) -     HM Diabetes Eye Exam -     HM Diabetes Foot Exam -     Comprehensive metabolic panel -     CBC with Differential/Platelet -     POCT glucose (manual entry) -     POCT glycosylated hemoglobin (Hb A1C)  Dyslipidemia with low high density lipoprotein (HDL) cholesterol with hypertriglyceridemia due to type 2 diabetes mellitus (HCC) -     atorvastatin (LIPITOR) 40 MG tablet; Take 1 tablet (40 mg total) by mouth daily.  Uncontrolled type 2 diabetes mellitus without complication, with long-term current use of insulin (HCC) -     insulin detemir (LEVEMIR) 100 UNIT/ML injection; Inject 0.2 mLs (20 Units total) into the skin 2 (two) times daily. -     insulin regular (HUMULIN R) 100 units/mL injection; Inject 0.08 mLs (8 Units total) into the skin 3 (three) times daily before meals. -     SitaGLIPtin-MetFORMIN HCl 50-1000 MG TB24; Take 1 tablet by mouth 2 (two) times daily with a meal.  Essential hypertension -     lisinopril (PRINIVIL,ZESTRIL) 5 MG tablet; Take 1 tablet (5 mg total) by mouth daily.  Other orders -     glipiZIDE (GLUCOTROL) 5 MG tablet; Take 1 tablet (5 mg total) by mouth 2 (two) times daily before a meal.    Patient Instructions       IF you received an x-ray today, you will receive an invoice from [facility]. Please contact [facility] at [phone] with questions or concerns regarding your invoice.   IF you received labwork today, you will receive an invoice from [facility]. Please contact [facility] at [phone] with questions or concerns regarding your invoice.   Our billing staff will not be able to assist you with questions regarding bills from these companies.  You will be contacted with the lab results as soon as  they are available.  The fastest way to get your results is to activate your My Chart account. Instructions are located on the last page of this paperwork. If you have not heard from [place] regarding the results in 2 weeks, please contact this office.     Diabetes mellitus y nutricin Diabetes Mellitus and Nutrition Si sufre de diabetes (diabetes mellitus), es muy importante tener hbitos alimenticios saludables debido a que sus niveles de Designer, television/film set sangre (glucosa) se ven afectados en gran medida por lo que come y bebe. [place] alimentos saludables en las cantidades [place], aproximadamente a la [employer], [place] ayudar a:  Aeronautical engineer glucemia.  Disminuir el riesgo de sufrir una enfermedad cardaca.  Mejorar la presin arterial.  Science writer o mantener un peso saludable.  Todas las personas que sufren de diabetes son diferentes y cada una tiene necesidades diferentes en cuanto a un plan de alimentacin. El mdico puede recomendarle que trabaje con un especialista en dietas y nutricin (nutricionista) para Financial trader plan para usted. Su plan de alimentacin puede variar segn factores como:  Las caloras que necesita.  Los medicamentos que toma.  Su peso.  Sus niveles de glucemia, presin arterial y colesterol.  Su nivel de [place].  Otras afecciones que tenga, como enfermedades cardacas o renales.  Cmo me afectan los carbohidratos? Los carbohidratos afectan el nivel de glucemia ms que cualquier otro tipo de alimento. La ingesta de carbohidratos naturalmente aumenta la cantidad glucosa en la sangre. El recuento de carbohidratos es un mtodo destinado a Catering manager un registro de la cantidad de carbohidratos que se ingieren. El recuento de carbohidratos es importante para Theatre manager la glucemia a un nivel saludable, en especial si utiliza insulina o toma determinados medicamentos por va oral para la diabetes. Es importante saber la cantidad de carbohidratos que se  pueden ingerir en cada comida sin correr Engineer, manufacturing. Esto es Psychologist, forensic. El nutricionista puede ayudarlo a calcular la cantidad de carbohidratos que debe ingerir en cada comida y colacin. Los alimentos que contienen carbohidratos incluyen:  Pan, cereal, arroz, pasta y galletas.  Papas y maz.  Guisantes, frijoles y lentejas.  Leche y Estate agent.  Lambert Mody y [place].  Postres, como pasteles, galletitas, helado y caramelos.  Cmo me afecta el alcohol? El alcohol puede provocar disminuciones sbitas de la glucemia (hipoglucemia), en especial si utiliza insulina o toma determinados medicamentos por va oral para la diabetes. La hipoglucemia es una afeccin potencialmente mortal. Los sntomas de la hipoglucemia (somnolencia, mareos y confusin) son similares a los sntomas de haber consumido demasiado alcohol. Si el mdico afirma que el alcohol es seguro para usted, siga estas pautas:  Limite el consumo de alcohol a no ms de 1 medida por da si es mujer y no est Music therapist, y a 2 medidas si es hombre. Una medida equivale a 12oz (349m) de cerveza, 5oz (1424m de vino o 1oz (4471mde bebidas de alta graduacin alcohlica.  No beba con el estmago vaco.  Mantngase hidratado con agua, gaseosas dietticas o t helado sin azcar.  Tenga en cuenta que las gaseosas comunes, los jugos y otros refrescos pueden contener mucha azcar y se deben contar como carbohidratos.  Consejos para seguir estPhotographers etiquetas de los alimentos  Comience por controlar el tamao de la porcin en la etiqueta. La cantidad de caloras, carbohidratos, grasas y otros nutrientes mencionados en la etiqueta se basan en una porcin del alimento. Muchos alimentos contienen ms de una porcin por envase.  Verifique la cantidad total de gramos (g) de carbohidratos totales en una porcin. Puede calcular la cantidad de porciones de carbohidratos al dividir el total de carbohidratos por 15. Por  ejemplo, si un alimento posee un total de 30g de carbohidratos, equivale a 2 porciones de carbohidratos.  Verifique la cantidad de gramos (g) de grasas saturadas y grasas trans en una porcin. Escoja alimentos que no contengan grasa o que tengan un bajo contenido.  Controle la cantidad de miligramos (mg) de sodio en una porcin. La [employer] de las personas deben limitar la ingesta de sodio total a menos de '2300mg'$  por Training and development officer.  Siempre consulte la informacin nutricional de los alimentos etiquetados como "con bajo contenido de grasa" o "sin grasa". Estos alimentos pueden ser ms altos en azcar agregada o en carbohidratos refinados y deben evitarse.  Hable con el nutricionista para identificar sus objetivos diarios en cuanto a los nutrientes mencionados en la etiqueta. De compras  Evite comprar alimentos procesados, enlatados o prehechos. Estos alimentos tienden a TEFL teacher cantidad de [place], sodio y azcar agregada.  Compre en la zona exterior de la tienda de comestibles. Esta incluye frutas y [employer], granos a granel, carnes frescas y productos lcteos frescos. Coccin  Utilice mtodos de coccin a baja temperatura, como hornear, en lugar de mtodos de coccin a alta temperatura, como frer en abundante aceite.  Cocine con aceites saludables, como el aceite de [place], canola o [place].  Evite cocinar con manteca, crema o carnes con alto contenido de grasa. Planificacin de las comidas  [employer] comidas y las colaciones de forma regular, preferentemente a la misma hora todos [place]. Evite pasar largos perodos de tiempo sin comer.  Consuma alimentos ricos en fibra, como frutas frescas, verduras, frijoles y cereales integrales. Consulte al nutricionista sobre cuntas porciones de carbohidratos puede consumir en cada comida.  Consuma entre 4 y 6 onzas de protenas magras por da, como carnes [place], pollo, pescado, [employer] o tofu. 1 onza equivale a 1 onza de carne, pollo o  pescado, 1 huevo, o 1/4 taza de tofu.  Coma algunos alimentos por da que contengan grasas saludables, como aguacates, frutos secos, semillas y pescado. Estilo de vida   Controle su nivel de glucemia con regularidad.  Haga ejercicio al menos 34mnutos, 5das o ms por semana, o como se lo haya indicado el mdico.  Tome los mTenneco Incse lo haya indicado el mdico.  No consuma ningn producto que contenga nicotina o tabaco, como cigarrillos y cPsychologist, sport and exercise Si necesita ayuda para dejar de fumar, consulte al mHess Corporationcon un asesor o instructor en diabetes para identificar estrategias para controlar el estrs y cualquier desafo emocional y social. Cules son algunas de las preguntas que puedo hacerle a mi mdico?  Es necesario que me rena con uRadio broadcast assistanten diabetes?  Es necesario que me rena con un nutricionista?  A qu nmero puedo llamar si tengo preguntas?  Cules son los mejores momentos para controlar la glucemia? Dnde encontrar ms informacin:  Asociacin Americana de la Diabetes (American Diabetes Association): diabetes.org/food-and-fitness/food  Academia de Nutricin y DInformation systems manager(Academy of Nutrition and Dietetics): wPokerClues.dk IMooreDiabetes y lJames Cityy RWater quality scientist([facility] - Grenadaof Diabetes and Digestive and Kidney Diseases) ([place] NIH): wContactWire.beResumen  Un plan de alimentacin saludable lo ayudar a cAeronautical engineerglucemia y mTheatre managerun estilo de vida saludable.  Trabajar con un especialista en dietas y nutricin (nutricionista) puede ayudarlo a ePaediatric nurse  el mejor plan de alimentacin para usted.  Tenga en cuenta que los carbohidratos y el alcohol tienen efectos inmediatos en sus niveles de glucemia. Es importante contar los carbohidratos  y consumir alcohol con prudencia. Esta informacin no tiene Marine scientist el consejo del mdico. Asegrese de hacerle al mdico cualquier pregunta que tenga. Document Released: 05/29/2007 Document Revised: 06/11/2016 Document Reviewed: 06/11/2016 Elsevier Interactive Patient Education  2018 Elsevier Inc.      Agustina Caroli, MD [facility]

## 2017-07-28 LAB — CBC WITH DIFFERENTIAL/PLATELET
BASOS ABS: 0.1 10*3/uL (ref 0.0–0.2)
Basos: 1 %
EOS (ABSOLUTE): 0.3 10*3/uL (ref 0.0–0.4)
Eos: 3 %
HEMOGLOBIN: 15.6 g/dL (ref 13.0–17.7)
Hematocrit: 48.1 % (ref 37.5–51.0)
IMMATURE GRANS (ABS): 0 10*3/uL (ref 0.0–0.1)
Immature Granulocytes: 0 %
LYMPHS ABS: 2.7 10*3/uL (ref 0.7–3.1)
LYMPHS: 30 %
MCH: 28.9 pg (ref 26.6–33.0)
MCHC: 32.4 g/dL (ref 31.5–35.7)
MCV: 89 fL (ref 79–97)
MONOCYTES: 9 %
Monocytes Absolute: 0.8 10*3/uL (ref 0.1–0.9)
NEUTROS ABS: 5.2 10*3/uL (ref 1.4–7.0)
Neutrophils: 57 %
PLATELETS: 265 10*3/uL (ref 150–450)
RBC: 5.39 x10E6/uL (ref 4.14–5.80)
RDW: 13.3 % (ref 12.3–15.4)
WBC: 9 10*3/uL (ref 3.4–10.8)

## 2017-07-28 LAB — COMPREHENSIVE METABOLIC PANEL
A/G RATIO: 1.4 (ref 1.2–2.2)
ALK PHOS: 123 IU/L — AB (ref 39–117)
ALT: 11 IU/L (ref 0–44)
AST: 11 IU/L (ref 0–40)
Albumin: 3.9 g/dL (ref 3.5–5.5)
BILIRUBIN TOTAL: 0.4 mg/dL (ref 0.0–1.2)
BUN/Creatinine Ratio: 13 (ref 9–20)
BUN: 11 mg/dL (ref 6–24)
CHLORIDE: 108 mmol/L — AB (ref 96–106)
CO2: 18 mmol/L — ABNORMAL LOW (ref 20–29)
Calcium: 9.5 mg/dL (ref 8.7–10.2)
Creatinine, Ser: 0.82 mg/dL (ref 0.76–1.27)
GFR calc non Af Amer: 99 mL/min/{1.73_m2} (ref >59)
GFR, EST AFRICAN AMERICAN: 114 mL/min/{1.73_m2} (ref >59)
GLUCOSE: 289 mg/dL — AB (ref 65–99)
Globulin, Total: 2.8 g/dL (ref 1.5–4.5)
Potassium: 4.1 mmol/L (ref 3.5–5.2)
Sodium: 139 mmol/L (ref 134–144)
Total Protein: 6.7 g/dL (ref 6.0–8.5)

## 2017-07-31 ENCOUNTER — Encounter: Payer: Self-pay | Admitting: *Deleted

## 2017-08-12 DIAGNOSIS — M9905 Segmental and somatic dysfunction of pelvic region: Secondary | ICD-10-CM | POA: Diagnosis not present

## 2017-08-12 DIAGNOSIS — M9903 Segmental and somatic dysfunction of lumbar region: Secondary | ICD-10-CM | POA: Diagnosis not present

## 2017-08-12 DIAGNOSIS — M5416 Radiculopathy, lumbar region: Secondary | ICD-10-CM | POA: Diagnosis not present

## 2017-08-15 DIAGNOSIS — M9903 Segmental and somatic dysfunction of lumbar region: Secondary | ICD-10-CM | POA: Diagnosis not present

## 2017-08-15 DIAGNOSIS — M9905 Segmental and somatic dysfunction of pelvic region: Secondary | ICD-10-CM | POA: Diagnosis not present

## 2017-08-15 DIAGNOSIS — M5416 Radiculopathy, lumbar region: Secondary | ICD-10-CM | POA: Diagnosis not present

## 2017-11-01 ENCOUNTER — Encounter: Payer: Self-pay | Admitting: Emergency Medicine

## 2017-11-01 ENCOUNTER — Ambulatory Visit: Payer: 59 | Admitting: Emergency Medicine

## 2017-11-01 ENCOUNTER — Other Ambulatory Visit: Payer: Self-pay

## 2017-11-01 VITALS — BP 132/72 | HR 72 | Temp 97.6°F | Resp 16 | Ht 65.0 in | Wt 162.6 lb

## 2017-11-01 DIAGNOSIS — Z794 Long term (current) use of insulin: Secondary | ICD-10-CM | POA: Diagnosis not present

## 2017-11-01 DIAGNOSIS — E782 Mixed hyperlipidemia: Secondary | ICD-10-CM | POA: Diagnosis not present

## 2017-11-01 DIAGNOSIS — E1165 Type 2 diabetes mellitus with hyperglycemia: Secondary | ICD-10-CM | POA: Diagnosis not present

## 2017-11-01 DIAGNOSIS — I10 Essential (primary) hypertension: Secondary | ICD-10-CM | POA: Diagnosis not present

## 2017-11-01 DIAGNOSIS — E1169 Type 2 diabetes mellitus with other specified complication: Secondary | ICD-10-CM | POA: Diagnosis not present

## 2017-11-01 LAB — POCT GLYCOSYLATED HEMOGLOBIN (HGB A1C): Hemoglobin A1C: 11.1 % — AB (ref 4.0–5.6)

## 2017-11-01 LAB — GLUCOSE, POCT (MANUAL RESULT ENTRY): POC Glucose: 432 mg/dl — AB (ref 70–99)

## 2017-11-01 MED ORDER — INSULIN REGULAR HUMAN 100 UNIT/ML IJ SOLN: 10.0000 [IU] | Freq: Three times a day (TID) | INTRAMUSCULAR | 11 refills | Status: DC

## 2017-11-01 MED ORDER — LISINOPRIL 10 MG PO TABS
10.0000 mg | ORAL_TABLET | Freq: Every day | ORAL | 3 refills | Status: DC
Start: 2017-11-01 — End: 2017-11-01

## 2017-11-01 MED ORDER — LISINOPRIL 10 MG PO TABS: 10.0000 mg | ORAL_TABLET | Freq: Every day | ORAL | 3 refills | Status: DC

## 2017-11-01 MED ORDER — METFORMIN HCL 500 MG PO TABS: 500.0000 mg | ORAL_TABLET | Freq: Two times a day (BID) | ORAL | 3 refills | Status: DC

## 2017-11-01 MED ORDER — ATORVASTATIN CALCIUM 40 MG PO TABS: 40.0000 mg | ORAL_TABLET | Freq: Every day | ORAL | 1 refills | Status: DC

## 2017-11-01 MED ORDER — INSULIN DETEMIR 100 UNIT/ML ~~LOC~~ SOLN: 20.0000 [IU] | Freq: Two times a day (BID) | SUBCUTANEOUS | 11 refills | Status: DC

## 2017-11-01 MED ORDER — GLIPIZIDE 5 MG PO TABS: 5.0000 mg | ORAL_TABLET | Freq: Two times a day (BID) | ORAL | 1 refills | Status: DC

## 2017-11-01 NOTE — Progress Notes (Signed)
Joseph Roberts 57 y.o.   Chief Complaint  Patient presents with  . Diabetes    3 month follow up  . Medication Refill    Lipitor, glipizide, Levemir, Insulin and Lisinopril    HISTORY OF PRESENT ILLNESS: This is a 57 y.o. male with a history of hypertension, diabetes, and high cholesterol on multiple medications here for follow-up and medication refill.  [name] has a very variable and erratic work schedule for seeing him to take medications at different times and without much consistency.  Lab Results  Component Value Date   HGBA1C 12.8 (A) 07/27/2017   BP Readings from Last 3 Encounters:  11/01/17 132/72  07/27/17 116/69  04/26/17 106/68   Wt Readings from Last 3 Encounters:  11/01/17 162 lb 9.6 oz (73.8 kg)  07/27/17 156 lb 12.8 oz (71.1 kg)  04/26/17 151 lb 3.2 oz (68.6 kg)    HPI   Prior to Admission medications   Medication Sig Start Date End Date Taking? Authorizing Provider  atorvastatin (LIPITOR) 40 MG tablet Take 1 tablet (40 mg total) by mouth daily. 07/27/17  Yes Myalee Stengel, Ines Bloomer, MD  Blood Glucose Monitoring Suppl (ONE TOUCH ULTRA MINI) W/DEVICE KIT 1 each by Does not apply route daily. Test fasting daily   Yes [provider]  CETIRIZINE HCL PO Take by mouth daily.   Yes [provider]  insulin detemir (LEVEMIR) 100 UNIT/ML injection Inject 0.2 mLs (20 Units total) into the skin 2 (two) times daily. 07/27/17  Yes Azyriah Nevins, Ines Bloomer, MD  insulin regular (HUMULIN R) 100 units/mL injection Inject 0.08 mLs (8 Units total) into the skin 3 (three) times daily before meals. 07/27/17  Yes Keelen Quevedo, Ines Bloomer, MD  lisinopril (PRINIVIL,ZESTRIL) 5 MG tablet Take 1 tablet (5 mg total) by mouth daily. 07/27/17  Yes Akshara Blumenthal, Ines Bloomer, MD  glipiZIDE (GLUCOTROL) 5 MG tablet Take 1 tablet (5 mg total) by mouth 2 (two) times daily before a meal. 07/27/17 10/25/17  Horald Pollen, MD  SitaGLIPtin-MetFORMIN HCl 50-1000 MG TB24 Take 1 tablet by  mouth 2 (two) times daily with a meal. 07/27/17 10/25/17  Horald Pollen, MD    No Known Allergies  Patient Active Problem List   Diagnosis Date Noted  . Uncontrolled type 2 diabetes mellitus with hyperglycemia ([place]) 07/27/2017  . Urinary urgency 10/19/2016  . Essential hypertension 03/06/2016  . Acute kidney injury ([place]) 03/06/2016  . Urinary retention 03/06/2016  . Uncontrolled type 2 diabetes mellitus with complication ([place])   . HHNC (hyperglycemic hyperosmolar nonketotic coma) ([place])   . Dyslipidemia   . Hyperglycemia 03/05/2016  . Type 2 diabetes mellitus with hyperglycemia ([place]) 03/05/2016  . 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 Dyslipidemia with low high density lipoprotein (HDL) cholesterol with hypertriglyceridemia due to type 2 diabetes mellitus ([place]) 11/03/2013  . Gall bladder polyp 10/24/2013  . GERD (gastroesophageal reflux disease) 10/24/2013  . Uncontrolled type 2 diabetes mellitus without complication, with long-term current use of insulin ([place]) 10/23/2013    Past Medical History:  Diagnosis Date  . Diabetes mellitus 2011  . High triglycerides     Past Surgical History:  Procedure Laterality Date  . COLONOSCOPY N/A 10/24/2013   Procedure: COLONOSCOPY;  Surgeon: Ladene Artist, MD;  [facility];  Service: Endoscopy;  Laterality: N/A;. diverticulosis, mild, small internal hemorrhoids    Social History   Socioeconomic History  . Marital status: Married    Spouse name: Not on file  . Number of children: Not  on file  . Years of education: Not on file  . Highest education level: Not on file  Occupational History  . Not on file  Social Needs  . Financial resource strain: Not on file  . Food insecurity:    Worry: Not on file    Inability: Not on file  . Transportation needs:    Medical: Not on file    Non-medical: Not on file  Tobacco Use  . Smoking status: Current Every Day Smoker    Packs/day: 0.50    Years: 30.00    Pack years: 15.00     Types: Cigarettes  . Smokeless tobacco: Never Used  Substance and Sexual Activity  . Alcohol use: No  . Drug use: No  . Sexual activity: Not on file  Lifestyle  . Physical activity:    Days per week: Not on file    Minutes per session: Not on file  . Stress: Not on file  Relationships  . Social connections:    Talks on phone: Not on file    Gets together: Not on file    Attends religious service: Not on file    Active member of club or organization: Not on file    Attends meetings of clubs or organizations: Not on file    Relationship status: Not on file  . Intimate partner violence:    Fear of current or ex partner: Not on file    Emotionally abused: Not on file    Physically abused: Not on file    Forced sexual activity: Not on file  Other Topics Concern  . Not on file  Social History Narrative  . Not on file    Family History  Problem Relation Age of Onset  . Diabetes Mother   . Diabetes Sister   . Diabetes Brother      Review of Systems  Constitutional: Negative.  Negative for chills and fever.  HENT: Negative.  Negative for congestion and sore throat.   Eyes: Negative.  Negative for blurred vision and double vision.  Respiratory: Negative.  Negative for cough and shortness of breath.   Cardiovascular: Negative.  Negative for chest pain and palpitations.  Gastrointestinal: Negative.  Negative for abdominal pain, diarrhea, nausea and vomiting.  Genitourinary: Negative for dysuria.  Musculoskeletal: Negative.   Skin: Negative.  Negative for rash.  Neurological: Negative.  Negative for dizziness and headaches.  All other systems reviewed and are negative.   Vitals:   11/01/17 1505  BP: 132/72  Pulse: 72  Resp: 16  Temp: 97.6 F (36.4 C)  SpO2: 96%    Physical Exam  Constitutional: He is oriented to person, place, and time. He appears well-developed and well-nourished.  HENT:  Head: Normocephalic and atraumatic.  Mouth/Throat: Oropharynx is clear and  moist.  Eyes: Pupils are equal, round, and reactive to light. Conjunctivae and EOM are normal.  Neck: Normal range of motion. Neck supple.  Cardiovascular: Normal rate and regular rhythm.  Pulmonary/Chest: Effort normal and breath sounds normal.  Abdominal: Soft. Bowel sounds are normal. He exhibits no distension. There is no tenderness.  Musculoskeletal: Normal range of motion. He exhibits no edema.  Neurological: He is alert and oriented to person, place, and time. No sensory deficit. He exhibits normal muscle tone.  Skin: Skin is warm and dry. Capillary refill takes less than 2 seconds. No rash noted.  Psychiatric: He has a normal mood and affect.  Vitals reviewed.  Results for orders placed or performed in visit  on 11/01/17 (from the past 24 hour(s))  POCT glucose (manual entry)     Status: Abnormal   Collection Time: 11/01/17  3:43 PM  Result Value Ref Range   POC Glucose 432 (A) 70 - 99 mg/dl  POCT glycosylated hemoglobin (Hb A1C)     Status: Abnormal   Collection Time: 11/01/17  3:56 PM  Result Value Ref Range   Hemoglobin A1C 11.1 (A) 4.0 - 5.6 %   HbA1c POC (<> result, manual entry)     HbA1c, POC (prediabetic range)     HbA1c, POC (controlled diabetic range)     Uncontrolled type 2 diabetes mellitus with hyperglycemia ([place]) Mr.Samperio's diabetes is still uncontrolled.  Despite multiple several guidance visits he continues to take medications erratically due to his inconsistent work schedule.  Medications reviewed and doses adjusted.  We will follow-up in 3 months.  If not controlled then, will switch to once a day [place] and once a week Trulicity.  A total of 40 minutes was spent in the room with the patient, greater than 50% of which was in counseling/coordination of care regarding diabetes management, medications, diet, need for follow-up.   ASSESSMENT & PLAN: Cynthia was seen today for diabetes and medication refill.  Diagnoses and all orders for this visit:  Type 2  diabetes mellitus with hyperglycemia, with long-term current use of insulin ([place]) -     Comprehensive metabolic panel -     Cancel: Hemoglobin A1c -     POCT glucose (manual entry) -     glipiZIDE (GLUCOTROL) 5 MG tablet; Take 1 tablet (5 mg total) by mouth 2 (two) times daily before a meal. -     insulin detemir (LEVEMIR) 100 UNIT/ML injection; Inject 0.2 mLs (20 Units total) into the skin 2 (two) times daily. -     insulin regular (HUMULIN R) 100 units/mL injection; Inject 0.1 mLs (10 Units total) into the skin 3 (three) times daily before meals. -     POCT glycosylated hemoglobin (Hb A1C) -     metFORMIN (GLUCOPHAGE) 500 MG tablet; Take 1 tablet (500 mg total) by mouth 2 (two) times daily with a meal.  Dyslipidemia with low high density lipoprotein (HDL) cholesterol with hypertriglyceridemia due to type 2 diabetes mellitus (H[facility]) -     Lipid panel -     atorvastatin (LIPITOR) 40 MG tablet; Take 1 tablet (40 mg total) by mouth daily.  Essential hypertension -     lisinopril (PRINIVIL,ZESTRIL) 10 MG tablet; Take 1 tablet (10 mg total) by mouth daily.    Patient Instructions   Hipertensin Hypertension El trmino hipertensin es otra forma de denominar a la presin arterial elevada. La presin arterial elevada fuerza al corazn a trabajar ms para bombear la sangre. Esto puede causar problemas con el paso del [place]. Una lectura de presin arterial est compuesta por 2 nmeros. Hay un nmero superior (sistlico) sobre un nmero inferior (diastlico). Lo ideal es tener la presin arterial por debajo de 120/80. Las decisiones saludables pueden ayudarle a disminuir su presin arterial. Es posible que necesite medicamentos que le ayuden a disminuir su presin arterial si:  Su presin arterial no disminuye mediante decisiones saludables.  Su presin arterial est por encima de 130/80.  Siga estas instrucciones en su casa: Comida y bebida  Si se lo indican, siga el plan de alimentacin de  DASH (Dietary Approaches to Stop Hypertension, Maneras de alimentarse para detener la hipertensin). Esta dieta incluye: ? Que la mitad del plato de  cada comida sea de frutas y verduras. ? Que un cuarto del plato de cada comida sea de cereales integrales. Los cereales integrales incluyen pasta integral, arroz integral y pan integral. ? Comer y beber productos lcteos con bajo contenido de [place], como leche descremada o yogur bajo en grasas. ? Que un cuarto del plato de cada comida sea de protenas bajas en grasa (magras). Las protenas bajas en grasa incluyen pescado, pollo sin piel, huevos, frijoles y tofu. ? Evitar consumir carne grasa, carne curada y procesada, o pollo con piel. ? Evitar consumir alimentos prehechos o procesados.  Consuma menos de 1500 mg de sal (sodio) por da.  Limite el consumo de alcohol a no ms de 1 medida por da si es mujer y no est Music therapist y a 2 medidas por da si es hombre. Una medida equivale a 12onzas de cerveza, 5onzas de vino o 1onzas de bebidas alcohlicas de alta graduacin. Estilo de vida  Trabaje con su mdico para mantenerse en un peso saludable o para perder peso. Pregntele a su mdico cul es el peso recomendable para usted.  Realice al menos 30 minutos de ejercicio que haga que se acelere su corazn (ejercicio Arboriculturist) la [employer] de la [place]. Estos pueden incluir caminar, nadar o andar en bicicleta.  Realice al menos 30 minutos de ejercicio que fortalezca sus msculos (ejercicios de resistencia) al menos 3 das a la [place]. Estos pueden incluir levantar pesas o hacer pilates.  No consuma ningn producto que contenga nicotina o tabaco. Esto incluye cigarrillos y cigarrillos electrnicos. Si necesita ayuda para dejar de fumar, consulte al [employer].  Controle su presin arterial en su casa tal como le indic el mdico.  Concurra a todas las visitas de control como se lo haya indicado el mdico. Esto es  importante. Medicamentos  [employer] de venta libre y los recetados solamente como se lo haya indicado el mdico. Siga cuidadosamente las indicaciones.  No omita las dosis de medicamentos para la presin arterial. Los medicamentos pierden eficacia si omite dosis. El hecho de omitir las dosis tambin [place] el riesgo de otros problemas.  Pregntele a su mdico a qu efectos secundarios o reacciones a los Careers information officer. Comunquese con un mdico si:  Piensa que tiene [place] reaccin a los medicamentos que est tomando.  Tiene dolores de cabeza frecuentes (recurrentes).  Siente mareos.  Tiene hinchazn en los tobillos.  Tiene problemas de visin. Solicite ayuda de inmediato si:  Siente un dolor de cabeza muy intenso.  Comienza a sentirse confundido.  Se siente dbil o adormecido.  Siente que va a desmayarse.  Siente un dolor muy intenso en: ? El pecho. ? El vientre (abdomen).  Devuelve (vomita) ms de una vez.  Tiene dificultad para respirar. Resumen  El trmino hipertensin es otra forma de denominar a la presin arterial elevada.  Las decisiones saludables pueden ayudarle a disminuir su presin arterial. Si no puede controlar su presin arterial mediante decisiones saludables, es posible que deba tomar medicamentos. Esta informacin no tiene Marine scientist el consejo del mdico. Asegrese de hacerle al mdico cualquier pregunta que tenga. Document Released: 08/09/2009 Document Revised: 02/01/2016 Document Reviewed: 02/01/2016 Elsevier Interactive Patient Education  2018 [employer]. O diabetes mellitus e a nutrio Diabetes Mellitus and Nutrition Quando voc sofre de diabetes (diabetes mellitus),  muito importante ter hbitos de alimentao saudveis, uma vez que os seus nveis de acar no sangue (glicose) so significativamente afetados pelo que voc come e bebe.  Comer alimentos saudveis nas quantidades apropriadas e  aproximadamente nos mesmos horrios todos os dias pode ajudar voc a:  Controlar seu nvel de glicose sangunea.  Reduzir seu risco de doena cardaca.  Melhorar sua presso arterial.  Alcanar ou manter um peso saudvel.  Todas as pessoas com diabetes so diferentes, e cada pessoa tem diferentes necessidades em termos de um plano de refeies. Seu mdico poder recomendar que voc colabore com um especialista em nutrio e dieta (nutricionista) para elaborar o melhor plano de refeies para voc. Seu plano de refeies poder variar dependendo de fatores como:  As calorias de que voc necessita.  Os medicamentos que toma.  Seu peso.  Seus nveis de glicose sangunea, presso arterial e colesterol.  Seu nvel de atividade.  Outros quadros clnicos, como doena cardaca ou renal.  Como os carboidratos me afetam? Os carboidratos afetam o nvel de sua glicose sangunea mais do que qualquer outro tipo de [place]. A ingesto de carboidratos naturalmente aumenta a quantidade de glicose no sangue. A contagem de carboidratos  um mtodo para acompanhar a quantidade de carboidratos ingerida por voc. A contagem de carboidratos  importante para manter sua glicose sangunea em um nvel saudvel, especialmente se voc usar insulina ou tomar certos medicamentos orais para o diabetes mellitus.  importante saber quantos carboidratos voc pode ingerir com segurana em cada refeio. Isso varia de pessoa para pessoa. Seu nutricionista poder ajudar voc a calcular quantos carboidratos voc precisa obter em cada refeio e como lanche. Alimentos que contm carboidratos incluem:  Po, cereais, arroz, massas e bolachas.  Batatas e milho.  Ervilhas, feijo e lentilhas.  Leite e iogurte.  Frutas e sucos.  Sobremesas, como bolos, biscoitos, sorvete e doces.  Como o lcool me afeta? O lcool pode causar uma sbita reduo do nvel de glicose sangunea (hipoglicemia), especialmente se voc usar  insulina ou tomar certos medicamentos orais para o diabetes. A hipoglicemia pode ser um quadro clnico potencialmente fatal. Os sintomas de hipoglicemia (sonolncia, vertigem e confuso) so parecidos com os sintomas da ingesto abusiva de lcool. Caso seu mdico diga que o lcool  seguro para voc, siga essas orientaes:  Limite o consumo de bebidas alcolicas a, no mximo, 1 dose por dia para mulheres no grvidas e 2 doses por dia para homens. Uma dose equivale a 12 onas de cerveja, 5 onas de vinho ou 1 ona de bebida destilada.  No beba de estmago vazio.  Mantenha sua hidratao com gua, refrigerante diet ou ch gelado sem acar.  Tenha em mente que o refrigerante normal, suco e outras misturas podem conter [employer] acar e devem ser contadas como carboidratos.  Quais so as dicas para seguir este plano? Leia os rtulos de alimentos  Comece verificando o Higher education careers adviser poro no rtulo. A quantidade de calorias, carboidratos, gorduras e outros nutrientes listados no rtulo se baseia em uma poro do alimento. Muitos alimentos contm mais de uma poro por embalagem.  Verifique o total de gramas (g) de carboidratos em uma poro. Voc pode calcular o nmero de pores de carboidratos em uma poro dividindo o total de carboidratos por 15. Por exemplo: supondo que um alimento contenha no total 30 g de carboidratos, isso seria igual a 2 pores de carboidratos.  Verifique o nmero de gramas (g) de gorduras saturadas e trans em uma poro. Escolha alimentos com pouca ou nenhuma quantidade dessas gorduras.  Verifique o nmero de miligramas (mg) de sdio em uma poro. A maioria das pessoas deve limitar o consumo de sdio  a 2.300 mg por dia.  Sempre verifique as informaes nutricionais dos alimentos rotulados como de "baixo teor de gordura" e "gordura zero". Esses alimentos podem conter elevado teor de acar ou carboidratos refinados e devem ser evitados.  Converse com seu nutricionista  para identificar suas metas dirias dos nutrientes listados na tabela. No mercado  Evite comprar alimentos enlatados, semiprontos ou processados. Esses alimentos tendem a conter elevado teor de gordura, sdio e acares adicionados.  Escolha itens das reas mais externas da seo de alimentos. Ela inclui frutas e verduras frescas, cereais a granel, carnes frescas e produtos avirios frescos. Na cozinha  Use mtodos de cozimento de baixo calor, como assar, em vez de mtodos de cozimento de Advertising account planner, como fritura por imerso.  Cozinhe com leos saudveis para o corao, como de [place], canola ou [place].  Evite cozinhar com manteiga, creme ou carnes com elevado teor de [place]. O planejamento das refeies  Consuma refeies e lanches regularmente, de preferncia nos mesmos horrios todos os dias. Evite ficar longos perodos sem comer.  Coma alimentos ricos em [place], como frutas e verduras frescas, feijo e gros integrais. Converse com seu nutricionista sobre quantas pores de carboidratos voc pode comer em cada refeio.  Coma 4-6 onas de protena magra por dia, como carne magra, frango, peixe, ovos ou tofu. 1 ona  igual a 1 ona de carne, frango ou peixe, 1 ovo ou 1/4 de xcara de tofu.  Coma alguns alimentos todos os dias que contenham gorduras saudveis, como abacate, nozes, sementes e peixe. Estilo de vida   Verifique sua glicose sangunea regularmente.  Exercite-se por pelo menos 30 minutos em 5 dias da semana ou mais ou pelo tempo determinado pelo seu mdico.  Tome medicamentos somente de acordo com as orientaes do seu mdico.  No use nenhum produto que contenha nicotina ou tabaco, como cigarros e cigarros eletrnicos. Caso precise de ajuda para parar de fumar, fale com seu mdico.  Consulte-se com um especialista em diabetes para identificar estratgias para lidar com o estresse e quaisquer desafios emocionais ou sociais. Quais so [employer] para fazer  ao meu mdico?  Preciso me consultar com um especialista em diabetes?  Preciso me consultar com um nutricionista?  Para que nmero devo ligar se tiver dvidas?  Quais so os melhores horrios para verificar minha glicose sangunea? Onde conseguir mais informaes:  American Diabetes Association (Associao Americana do Diabetes): diabetes.org/food-and-fitness/food  Academy of Nutrition and Dietetics ([place]): PokerClues.dk  [employer] of Diabetes and Digestive and Kidney Diseases (NIH) ([place] Diabetes e Doenas Digestivas e Renais): ContactWire.be Resumo  Um plano de refeies saudveis ajudar voc a controlar sua glicose sangunea e a manter um estilo de vida saudvel.  Consultar um especialista em nutrio (nutricionista) poder ajudar na elaborao do melhor plano para voc.  Tenha em mente que carboidratos e lcool tm efeitos imediatos sobre seus nveis de glicose sangunea.  importante contar os carboidratos e consumir lcool com cuidado. Estas informaes no se destinam a substituir as recomendaes de seu mdico. No deixe de discutir quaisquer dvidas com seu mdico. Document Released: 06/13/2015 Document Revised: 06/21/2016 Document Reviewed: 06/21/2016 Elsevier Interactive Patient Education  [employer].     If you have lab work done today you will be contacted with your lab results within the next 2 weeks.  If you have not heard from [place] then please contact us. The fastest way to get your results is to register for My Chart.  IF you received an x-ray today, you will receive an invoice from [facility]. Please contact [facility] at [phone] with questions or concerns regarding your invoice.   IF you received labwork today, you will receive an invoice from [facility]. Please  contact [facility] at [phone] with questions or concerns regarding your invoice.   Our billing staff will not be able to assist you with questions regarding bills from these companies.  You will be contacted with the lab results as soon as they are available. The fastest way to get your results is to activate your My Chart account. Instructions are located on the last page of this paperwork. If you have not heard from [place] regarding the results in 2 weeks, please contact this office.      Diabetes mellitus y nutricin Diabetes Mellitus and Nutrition Si sufre de diabetes (diabetes mellitus), es muy importante tener hbitos alimenticios saludables debido a que sus niveles de Designer, television/film set sangre (glucosa) se ven afectados en gran medida por lo que come y bebe. Comer alimentos saludables en las cantidades [place], aproximadamente a la [employer], [place] ayudar a:  Aeronautical engineer glucemia.  Disminuir el riesgo de sufrir una enfermedad cardaca.  Mejorar la presin arterial.  Science writer o mantener un peso saludable.  Todas las personas que sufren de diabetes son diferentes y cada una tiene necesidades diferentes en cuanto a un plan de alimentacin. El mdico puede recomendarle que trabaje con un especialista en dietas y nutricin (nutricionista) para Financial trader plan para usted. Su plan de alimentacin puede variar segn factores como:  Las caloras que necesita.  Los medicamentos que toma.  Su peso.  Sus niveles de glucemia, presin arterial y colesterol.  Su nivel de [place].  Otras afecciones que tenga, como enfermedades cardacas o renales.  Cmo me afectan los carbohidratos? Los carbohidratos afectan el nivel de glucemia ms que cualquier otro tipo de alimento. La ingesta de carbohidratos naturalmente aumenta la cantidad glucosa en la sangre. El recuento de carbohidratos es un mtodo destinado a Catering manager un registro de la cantidad de carbohidratos que se  ingieren. El recuento de carbohidratos es importante para Theatre manager la glucemia a un nivel saludable, en especial si utiliza insulina o toma determinados medicamentos por va oral para la diabetes. Es importante saber la cantidad de carbohidratos que se pueden ingerir en cada comida sin correr Engineer, manufacturing. Esto es Psychologist, forensic. El nutricionista puede ayudarlo a calcular la cantidad de carbohidratos que debe ingerir en cada comida y colacin. Los alimentos que contienen carbohidratos incluyen:  Pan, cereal, arroz, pasta y galletas.  Papas y maz.  Guisantes, frijoles y lentejas.  Leche y Estate agent.  Lambert Mody y [place].  Postres, como pasteles, galletitas, helado y caramelos.  Cmo me afecta el alcohol? El alcohol puede provocar disminuciones sbitas de la glucemia (hipoglucemia), en especial si utiliza insulina o toma determinados medicamentos por va oral para la diabetes. La hipoglucemia es una afeccin potencialmente mortal. Los sntomas de la hipoglucemia (somnolencia, mareos y confusin) son similares a los sntomas de haber consumido demasiado alcohol. Si el mdico afirma que el alcohol es seguro para usted, siga estas pautas:  Limite el consumo de alcohol a no ms de 1 medida por da si es mujer y no est Music therapist, y a 2 medidas si es hombre. Una medida equivale a 12oz (345m) de cerveza, 5oz (1457m de vino o 1oz (4429mde bebidas de alta graduacin alcohlica.  No beba con el estmago vaco.  Mantngase hidratado con agua, gaseosas dietticas o t helado sin azcar.  Tenga en cuenta que las gaseosas comunes, los jugos y otros refrescos pueden contener mucha azcar y se deben contar como carbohidratos.  Consejos para seguir Photographer las etiquetas de los alimentos  Comience por controlar el tamao de la porcin en la etiqueta. La cantidad de caloras, carbohidratos, grasas y otros nutrientes mencionados en la etiqueta se basan en una porcin del alimento.  Muchos alimentos contienen ms de una porcin por envase.  Verifique la cantidad total de gramos (g) de carbohidratos totales en una porcin. Puede calcular la cantidad de porciones de carbohidratos al dividir el total de carbohidratos por 15. Por ejemplo, si un alimento posee un total de 30g de carbohidratos, equivale a 2 porciones de carbohidratos.  Verifique la cantidad de gramos (g) de grasas saturadas y grasas trans en una porcin. Escoja alimentos que no contengan grasa o que tengan un bajo contenido.  Controle la cantidad de miligramos (mg) de sodio en una porcin. [place] [employer] de las personas deben limitar la ingesta de sodio total a menos de 2370m por dTraining and development officer  Siempre consulte la informacin nutricional de los alimentos etiquetados como "con bajo contenido de grasa" o "sin grasa". Estos alimentos pueden ser ms altos en azcar agregada o en carbohidratos refinados y deben evitarse.  Hable con el nutricionista para identificar sus objetivos diarios en cuanto a los nutrientes mencionados en la etiqueta. De compras  Evite comprar alimentos procesados, enlatados o prehechos. Estos alimentos tienden a tTEFL teachercantidad de gStidham sodio y azcar agregada.  Compre en la zona exterior de la tienda de comestibles. Esta incluye frutas y vNorthrop Grumman granos a granel, carnes frescas y productos lcteos frescos. Coccin  Utilice mtodos de coccin a baja temperatura, como hornear, en lugar de mtodos de coccin a alta temperatura, como frer en abundante aceite.  Cocine con aceites saludables, como el aceite de [place] canola o [place]  Evite cocinar con manteca, crema o carnes con alto contenido de grasa. Planificacin de las comidas  CInternational Papercomidas y las colaciones de forma regular, preferentemente a la misma hora todos lFriday [place] Evite pasar largos perodos de tiempo sin comer.  Consuma alimentos ricos en fibra, como frutas frescas, verduras, frijoles y cereales integrales.  Consulte al nutricionista sobre cuntas porciones de carbohidratos puede consumir en cada comida.  Consuma entre 4 y 6 onzas de protenas magras por da, como carnes mHartly pollo, pescado, hFirst Data Corporationo tofu. 1 onza equivale a 1 onza de carne, pollo o pescado, 1 huevo, o 1/4 taza de tofu.  Coma algunos alimentos por da que contengan grasas saludables, como aguacates, frutos secos, semillas y pescado. Estilo de vida   Controle su nivel de glucemia con regularidad.  Haga ejercicio al menos 360mutos, 5das o ms por semana, o como se lo haya indicado el mdico.  Tome los meTenneco Ince lo haya indicado el mdico.  No consuma ningn producto que contenga nicotina o tabaco, como cigarrillos y ciPsychologist, sport and exerciseSi necesita ayuda para dejar de fumar, consulte al mdHess Corporationon un asesor o instructor en diabetes para identificar estrategias para controlar el estrs y cualquier desafo emocional y social. Cules son algunas de las preguntas que puedo hacerle a mi mdico?  Es necesario que me rena con unRadio broadcast assistantn diabetes?  Es necesario que me rena con un nutricionista?  A qu nmero puedo llamar si tengo preguntas?  Cules son los mejores momentos para coAeronautical engineer  glucemia? Dnde encontrar ms informacin:  Asociacin Americana de la Diabetes (American Diabetes Association): diabetes.org/food-and-fitness/food  Academia de Nutricin y Information systems manager (Academy of Nutrition and Dietetics): PokerClues.dk  [place] Diabetes y [place] y Water quality scientist [facility] of Diabetes and Digestive and Kidney Diseases) ([place], NIH): ContactWire.be Resumen  Un plan de alimentacin saludable lo ayudar a Aeronautical engineer glucemia y Theatre manager un estilo de vida saludable.  Trabajar con un especialista en  dietas y nutricin (nutricionista) puede ayudarlo a Insurance claims handler de alimentacin para usted.  Tenga en cuenta que los carbohidratos y el alcohol tienen efectos inmediatos en sus niveles de glucemia. Es importante contar los carbohidratos y consumir alcohol con prudencia. Esta informacin no tiene Marine scientist el consejo del mdico. Asegrese de hacerle al mdico cualquier pregunta que tenga. Document Released: 05/29/2007 Document Revised: 06/11/2016 Document Reviewed: 06/11/2016 Elsevier Interactive Patient Education  2018 Elsevier Inc.      Agustina Caroli, MD [facility]

## 2017-11-01 NOTE — Patient Instructions (Addendum)
Hipertensin  Hypertension  El trmino hipertensin es otra forma de denominar a la presin arterial elevada. La presin arterial elevada fuerza al corazn a trabajar ms para bombear la sangre. Esto puede causar problemas con el paso del tiempo.  Una lectura de presin arterial est compuesta por 2 nmeros. Hay un nmero superior (sistlico) sobre un nmero inferior (diastlico). Lo ideal es tener la presin arterial por debajo de 120/80. Las decisiones saludables pueden ayudarle a disminuir su presin arterial. Es posible que necesite medicamentos que le ayuden a disminuir su presin arterial si:   Su presin arterial no disminuye mediante decisiones saludables.   Su presin arterial est por encima de 130/80.    Siga estas instrucciones en su casa:  Comida y bebida   Si se lo indican, siga el plan de alimentacin de DASH (Dietary Approaches to Stop Hypertension, Maneras de alimentarse para detener la hipertensin). Esta dieta incluye:  ? Que la mitad del plato de cada comida sea de frutas y verduras.  ? Que un cuarto del plato de cada comida sea de cereales integrales. Los cereales integrales incluyen pasta integral, arroz integral y pan integral.  ? Comer y beber productos lcteos con bajo contenido de grasa, como leche descremada o yogur bajo en grasas.  ? Que un cuarto del plato de cada comida sea de protenas bajas en grasa (magras). Las protenas bajas en grasa incluyen pescado, pollo sin piel, huevos, frijoles y tofu.  ? Evitar consumir carne grasa, carne curada y procesada, o pollo con piel.  ? Evitar consumir alimentos prehechos o procesados.   Consuma menos de 1500 mg de sal (sodio) por da.   Limite el consumo de alcohol a no ms de 1 medida por da si es mujer y no est embarazada y a 2 medidas por da si es hombre. Una medida equivale a 12onzas de cerveza, 5onzas de vino o 1onzas de bebidas alcohlicas de alta graduacin.  Estilo de vida   Trabaje con su mdico para mantenerse en un peso  saludable o para perder peso. Pregntele a su mdico cul es el peso recomendable para usted.   Realice al menos 30 minutos de ejercicio que haga que se acelere su corazn (ejercicio aerbico) la mayora de los das de la semana. Estos pueden incluir caminar, nadar o andar en bicicleta.   Realice al menos 30 minutos de ejercicio que fortalezca sus msculos (ejercicios de resistencia) al menos 3 das a la semana. Estos pueden incluir levantar pesas o hacer pilates.   No consuma ningn producto que contenga nicotina o tabaco. Esto incluye cigarrillos y cigarrillos electrnicos. Si necesita ayuda para dejar de fumar, consulte al mdico.   Controle su presin arterial en su casa tal como le indic el mdico.   Concurra a todas las visitas de control como se lo haya indicado el mdico. Esto es importante.  Medicamentos   Tome los medicamentos de venta libre y los recetados solamente como se lo haya indicado el mdico. Siga cuidadosamente las indicaciones.   No omita las dosis de medicamentos para la presin arterial. Los medicamentos pierden eficacia si omite dosis. El hecho de omitir las dosis tambin aumenta el riesgo de otros problemas.   Pregntele a su mdico a qu efectos secundarios o reacciones a los medicamentos debe prestar atencin.  Comunquese con un mdico si:   Piensa que tiene una reaccin a los medicamentos que est tomando.   Tiene dolores de cabeza frecuentes (recurrentes).   Siente mareos.   Tiene hinchazn   Siente que va a desmayarse.  Siente un dolor muy intenso en: ? El pecho. ? El vientre (abdomen).  Devuelve (vomita) ms de una vez.  Tiene dificultad para respirar. Resumen  El trmino hipertensin es otra forma de denominar a la presin arterial elevada.  Las decisiones saludables  pueden ayudarle a disminuir su presin arterial. Si no puede controlar su presin arterial mediante decisiones saludables, es posible que deba tomar medicamentos. Esta informacin no tiene Theme park managercomo fin reemplazar el consejo del mdico. Asegrese de hacerle al mdico cualquier pregunta que tenga. Document Released: 08/09/2009 Document Revised: 02/01/2016 Document Reviewed: 02/01/2016 Elsevier Interactive Patient Education  2018 [employer] O diabetes mellitus e a nutrio Diabetes Mellitus and Nutrition Quando voc sofre de diabetes (diabetes mellitus),  muito importante ter hbitos de alimentao saudveis, uma vez que os seus nveis de acar no sangue (glicose) so significativamente afetados pelo que voc come e bebe. Comer alimentos saudveis nas quantidades apropriadas e aproximadamente nos mesmos horrios todos os dias pode ajudar voc a:  Controlar seu nvel de glicose sangunea.  Reduzir seu risco de doena cardaca.  Melhorar sua presso arterial.  Alcanar ou manter um peso saudvel.  Todas as pessoas com diabetes so diferentes, e cada pessoa tem diferentes necessidades em termos de um plano de refeies. Seu mdico poder recomendar que voc colabore com um especialista em nutrio e dieta (nutricionista) para elaborar o melhor plano de refeies para voc. Seu plano de refeies poder variar dependendo de fatores como:  As calorias de que voc necessita.  Os medicamentos que toma.  Seu peso.  Seus nveis de glicose sangunea, presso arterial e colesterol.  Seu nvel de atividade.  Outros quadros clnicos, como doena cardaca ou renal.  Como os carboidratos me afetam? Os carboidratos afetam o nvel de sua glicose sangunea mais do que qualquer outro tipo de [place]. A ingesto de carboidratos naturalmente aumenta a quantidade de glicose no sangue. A contagem de carboidratos  um mtodo para acompanhar a quantidade de carboidratos ingerida por voc. A contagem de  carboidratos  importante para manter sua glicose sangunea em um nvel saudvel, especialmente se voc usar insulina ou tomar certos medicamentos orais para o diabetes mellitus.  importante saber quantos carboidratos voc pode ingerir com segurana em cada refeio. Isso varia de pessoa para pessoa. Seu nutricionista poder ajudar voc a calcular quantos carboidratos voc precisa obter em cada refeio e como lanche. Alimentos que contm carboidratos incluem:  Po, cereais, arroz, massas e bolachas.  Batatas e milho.  Ervilhas, feijo e lentilhas.  Leite e iogurte.  Frutas e sucos.  Sobremesas, como bolos, biscoitos, sorvete e doces.  Como o lcool me afeta? O lcool pode causar uma sbita reduo do nvel de glicose sangunea (hipoglicemia), especialmente se voc usar insulina ou tomar certos medicamentos orais para o diabetes. A hipoglicemia pode ser um quadro clnico potencialmente fatal. Os sintomas de hipoglicemia (sonolncia, vertigem e confuso) so parecidos com os sintomas da ingesto abusiva de lcool. Caso seu mdico diga que o lcool  seguro para voc, siga essas orientaes:  Limite o consumo de bebidas alcolicas a, no mximo, 1 dose por dia para mulheres no grvidas e 2 doses por dia para homens. Uma dose equivale a 12 onas de cerveja, 5 onas de vinho ou 1 ona de bebida destilada.  No beba de estmago vazio.  Mantenha sua hidratao com gua, refrigerante diet ou ch gelado sem acar.  Tenha em mente que o refrigerante normal, suco e outras misturas podem conter muito acar  e devem ser contadas como carboidratos.  Quais so as dicas para seguir este plano? Leia os rtulos de alimentos  Comece verificando o Musician poro no rtulo. A quantidade de calorias, carboidratos, gorduras e outros nutrientes listados no rtulo se baseia em uma poro do alimento. Muitos alimentos contm mais de uma poro por embalagem.  Verifique o total de gramas (g) de  carboidratos em uma poro. Voc pode calcular o nmero de pores de carboidratos em uma poro dividindo o total de carboidratos por 15. Por exemplo: supondo que um alimento contenha no total 30 g de carboidratos, isso seria igual a 2 pores de carboidratos.  Verifique o nmero de gramas (g) de gorduras saturadas e trans em uma poro. Escolha alimentos com pouca ou nenhuma quantidade dessas gorduras.  Verifique o nmero de miligramas (mg) de sdio em uma poro. A maioria das pessoas deve limitar o consumo de sdio a 2.300 mg por dia.  Sempre verifique as informaes nutricionais dos alimentos rotulados como de "baixo teor de gordura" e "gordura zero". Esses alimentos podem conter elevado teor de acar ou carboidratos refinados e devem ser evitados.  Converse com seu nutricionista para identificar suas metas dirias dos nutrientes listados na tabela. No mercado  Evite comprar alimentos enlatados, semiprontos ou processados. Esses alimentos tendem a conter elevado teor de gordura, sdio e acares adicionados.  Escolha itens das reas mais externas da seo de alimentos. Ela inclui frutas e verduras frescas, cereais a granel, carnes frescas e produtos avirios frescos. Na cozinha  Use mtodos de cozimento de baixo calor, como assar, em vez de mtodos de cozimento de Musician, como fritura por imerso.  Cozinhe com leos saudveis para o corao, como de [place], canola ou [place].  Evite cozinhar com manteiga, creme ou carnes com elevado teor de [place]. O planejamento das refeies  Consuma refeies e lanches regularmente, de preferncia nos mesmos horrios todos os dias. Evite ficar longos perodos sem comer.  Coma alimentos ricos em [place], como frutas e verduras frescas, feijo e gros integrais. Converse com seu nutricionista sobre quantas pores de carboidratos voc pode comer em cada refeio.  Coma 4-6 onas de protena magra por dia, como carne magra, frango, peixe, ovos ou  tofu. 1 ona  igual a 1 ona de carne, frango ou peixe, 1 ovo ou 1/4 de xcara de tofu.  Coma alguns alimentos todos os dias que contenham gorduras saudveis, como abacate, nozes, sementes e peixe. Estilo de vida   Verifique sua glicose sangunea regularmente.  Exercite-se por pelo menos 30 minutos em 5 dias da semana ou mais ou pelo tempo determinado pelo seu mdico.  Tome medicamentos somente de acordo com as orientaes do seu mdico.  No use nenhum produto que contenha nicotina ou tabaco, como cigarros e cigarros eletrnicos. Caso precise de ajuda para parar de fumar, fale com seu mdico.  Consulte-se com um especialista em diabetes para identificar estratgias para lidar com o estresse e quaisquer desafios emocionais ou sociais. Quais so [employer] para fazer ao meu mdico?  Preciso me consultar com um especialista em diabetes?  Preciso me consultar com um nutricionista?  Para que nmero devo ligar se tiver dvidas?  Quais so os melhores horrios para verificar minha glicose sangunea? Onde conseguir mais informaes:  American Diabetes Association (Associao Americana do Diabetes): diabetes.org/food-and-fitness/food  Academy of Nutrition and Dietetics (Academia de Nutrio e Dieta): https://www.vargas.com/  [employer] of Diabetes and Digestive and Kidney Diseases (NIH) [employer] de Diabetes e Larna Daughters Digestivas e  Renais): FindJewelers.cz Resumo  Um plano de refeies saudveis ajudar voc a controlar sua glicose sangunea e a manter um estilo de vida saudvel.  Consultar um especialista em nutrio (nutricionista) poder ajudar na elaborao do melhor plano para voc.  Tenha em mente que carboidratos e lcool tm efeitos imediatos sobre seus nveis de glicose sangunea.  importante contar os carboidratos e consumir lcool com  cuidado. Estas informaes no se destinam a substituir as recomendaes de seu mdico. No deixe de discutir quaisquer dvidas com seu mdico. Document Released: 06/13/2015 Document Revised: 06/21/2016 Document Reviewed: 06/21/2016 Elsevier Interactive Patient Education  [employer].     If you have lab work done today you will be contacted with your lab results within the next 2 weeks.  If you have not heard from [place] then please contact us. The fastest way to get your results is to register for My Chart.   IF you received an x-ray today, you will receive an invoice from [facility]. Please contact [facility] at [phone] with questions or concerns regarding your invoice.   IF you received labwork today, you will receive an invoice from [facility]. Please contact [facility] at [phone] with questions or concerns regarding your invoice.   Our billing staff will not be able to assist you with questions regarding bills from these companies.  You will be contacted with the lab results as soon as they are available. The fastest way to get your results is to activate your My Chart account. Instructions are located on the last page of this paperwork. If you have not heard from [place] regarding the results in 2 weeks, please contact this office.      Diabetes mellitus y nutricin Diabetes Mellitus and Nutrition Si sufre de diabetes (diabetes mellitus), es muy importante tener hbitos alimenticios saludables debido a que sus niveles de Psychologist, counselling sangre (glucosa) se ven afectados en gran medida por lo que come y bebe. Comer alimentos saludables en las cantidades [place], aproximadamente a la [employer], [place] ayudar a:  Scientist, physiological glucemia.  Disminuir el riesgo de sufrir una enfermedad cardaca.  Mejorar la presin arterial.  Barista o mantener un peso saludable.  Todas las personas que sufren de diabetes son diferentes y cada una tiene  necesidades diferentes en cuanto a un plan de alimentacin. El mdico puede recomendarle que trabaje con un especialista en dietas y nutricin (nutricionista) para Tax adviser plan para usted. Su plan de alimentacin puede variar segn factores como:  Las caloras que necesita.  Los medicamentos que toma.  Su peso.  Sus niveles de glucemia, presin arterial y colesterol.  Su nivel de [place].  Otras afecciones que tenga, como enfermedades cardacas o renales.  Cmo me afectan los carbohidratos? Los carbohidratos afectan el nivel de glucemia ms que cualquier otro tipo de alimento. La ingesta de carbohidratos naturalmente aumenta la cantidad glucosa en la sangre. El recuento de carbohidratos es un mtodo destinado a Midwife un registro de la cantidad de carbohidratos que se ingieren. El recuento de carbohidratos es importante para Pharmacologist la glucemia a un nivel saludable, en especial si utiliza insulina o toma determinados medicamentos por va oral para la diabetes. Es importante saber la cantidad de carbohidratos que se pueden ingerir en cada comida sin correr Surveyor, minerals. Esto es Government social research officer. El nutricionista puede ayudarlo a calcular la cantidad de carbohidratos que debe ingerir en cada comida y colacin. Los alimentos que contienen carbohidratos incluyen:  Pan, cereal,  arroz, pasta y galletas.  Papas y maz.  Guisantes, frijoles y lentejas.  Leche y Dentist.  Nils Pyle y [place].  Postres, como pasteles, galletitas, helado y caramelos.  Cmo me afecta el alcohol? El alcohol puede provocar disminuciones sbitas de la glucemia (hipoglucemia), en especial si utiliza insulina o toma determinados medicamentos por va oral para la diabetes. La hipoglucemia es una afeccin potencialmente mortal. Los sntomas de la hipoglucemia (somnolencia, mareos y confusin) son similares a los sntomas de haber consumido demasiado alcohol. Si el mdico afirma que el alcohol es seguro  para usted, siga estas pautas:  Limite el consumo de alcohol a no ms de 1 medida por da si es mujer y no est Orthoptist, y a 2 medidas si es hombre. Una medida equivale a 12oz ( ) de cerveza, 5oz ( ) de vino o 1oz (44ml) de bebidas de alta graduacin alcohlica.  No beba con el estmago vaco.  Mantngase hidratado con agua, gaseosas dietticas o t helado sin azcar.  Tenga en cuenta que las gaseosas comunes, los jugos y otros refrescos pueden contener mucha azcar y se deben contar como carbohidratos.  Consejos para seguir Consulting civil engineer las etiquetas de los alimentos  Comience por controlar el tamao de la porcin en la etiqueta. La cantidad de caloras, carbohidratos, grasas y otros nutrientes mencionados en la etiqueta se basan en una porcin del alimento. Muchos alimentos contienen ms de una porcin por envase.  Verifique la cantidad total de gramos (g) de carbohidratos totales en una porcin. Puede calcular la cantidad de porciones de carbohidratos al dividir el total de carbohidratos por 15. Por ejemplo, si un alimento posee un total de 30g de carbohidratos, equivale a 2 porciones de carbohidratos.  Verifique la cantidad de gramos (g) de grasas saturadas y grasas trans en una porcin. Escoja alimentos que no contengan grasa o que tengan un bajo contenido.  Controle la cantidad de miligramos (mg) de sodio en una porcin. [place] [employer] de las personas deben limitar la ingesta de sodio total a menos de 2300mg  por Futures trader.  Siempre consulte la informacin nutricional de los alimentos etiquetados como "con bajo contenido de grasa" o "sin grasa". Estos alimentos pueden ser ms altos en azcar agregada o en carbohidratos refinados y deben evitarse.  Hable con el nutricionista para identificar sus objetivos diarios en cuanto a los nutrientes mencionados en la etiqueta. De compras  Evite comprar alimentos procesados, enlatados o prehechos. Estos alimentos tienden a Civil Service fast streamer  cantidad de [place], sodio y azcar agregada.  Compre en la zona exterior de la tienda de comestibles. Esta incluye frutas y [employer], granos a granel, carnes frescas y productos lcteos frescos. Coccin  Utilice mtodos de coccin a baja temperatura, como hornear, en lugar de mtodos de coccin a alta temperatura, como frer en abundante aceite.  Cocine con aceites saludables, como el aceite de [place], canola o [place].  Evite cocinar con manteca, crema o carnes con alto contenido de grasa. Planificacin de las comidas  [employer] comidas y las colaciones de forma regular, preferentemente a la misma hora todos [place]. Evite pasar largos perodos de tiempo sin comer.  Consuma alimentos ricos en fibra, como frutas frescas, verduras, frijoles y cereales integrales. Consulte al nutricionista sobre cuntas porciones de carbohidratos puede consumir en cada comida.  Consuma entre 4 y 6 onzas de protenas magras por da, como carnes [place], pollo, pescado, [employer] o tofu. 1 onza equivale a 1 onza de carne, pollo o pescado, 1 huevo, o 1/4 taza de  tofu.  Coma algunos alimentos por da que contengan grasas saludables, como aguacates, frutos secos, semillas y pescado. Estilo de vida   Controle su nivel de glucemia con regularidad.  Haga ejercicio al menos , 5das o ms por semana, o como se lo haya indicado el mdico.  Tome los [employer] se lo haya indicado el mdico.  No consuma ningn producto que contenga nicotina o tabaco, como cigarrillos y Administrator, Civil Service. Si necesita ayuda para dejar de fumar, consulte al CIG[employer] con un asesor o instructor en diabetes para identificar estrategias para controlar el estrs y cualquier desafo emocional y social. Cules son algunas de las preguntas que puedo hacerle a mi mdico?  Es necesario que me rena con IT trainer en diabetes?  Es necesario que me rena con un nutricionista?  A qu nmero puedo  llamar si tengo preguntas?  Cules son los mejores momentos para controlar la glucemia? Dnde encontrar ms informacin:  Asociacin Americana de la Diabetes (American Diabetes Association): diabetes.org/food-and-fitness/food  Academia de Nutricin y Pension scheme manager (Academy of Nutrition and Dietetics): https://www.vargas.com/  [employer] de la Diabetes y las Enfermedades Digestivas y Special educational needs teacher [facility] of Diabetes and Digestive and Kidney Diseases) (Institutos Nacionales de [place], NIH): FindJewelers.cz Resumen  Un plan de alimentacin saludable lo ayudar a Scientist, physiological glucemia y Pharmacologist un estilo de vida saludable.  Trabajar con un especialista en dietas y nutricin (nutricionista) puede ayudarlo a Designer, television/film set de alimentacin para usted.  Tenga en cuenta que los carbohidratos y el alcohol tienen efectos inmediatos en sus niveles de glucemia. Es importante contar los carbohidratos y consumir alcohol con prudencia. Esta informacin no tiene Theme park manager el consejo del mdico. Asegrese de hacerle al mdico cualquier pregunta que tenga. Document Released: 05/29/2007 Document Revised: 06/11/2016 Document Reviewed: 06/11/2016 Elsevier Interactive Patient Education  2018 [employer].

## 2017-11-01 NOTE — Assessment & Plan Note (Addendum)
Joseph Roberts's diabetes is still uncontrolled.  Despite multiple several guidance visits he continues to take medications erratically due to his inconsistent work schedule.  Medications reviewed and doses adjusted.  We will follow-up in 3 months.  If not controlled then, will switch to once a day [place] and once a week Trulicity.

## 2017-11-02 LAB — COMPREHENSIVE METABOLIC PANEL
ALBUMIN: 4.1 g/dL (ref 3.5–5.5)
ALK PHOS: 142 IU/L — AB (ref 39–117)
ALT: 19 IU/L (ref 0–44)
AST: 17 IU/L (ref 0–40)
Albumin/Globulin Ratio: 1.5 (ref 1.2–2.2)
BILIRUBIN TOTAL: 0.4 mg/dL (ref 0.0–1.2)
BUN / CREAT RATIO: 13 (ref 9–20)
BUN: 9 mg/dL (ref 6–24)
CHLORIDE: 102 mmol/L (ref 96–106)
CO2: 17 mmol/L — ABNORMAL LOW (ref 20–29)
Calcium: 9.1 mg/dL (ref 8.7–10.2)
Creatinine, Ser: 0.72 mg/dL — ABNORMAL LOW (ref 0.76–1.27)
GFR calc Af Amer: 121 mL/min/{1.73_m2} (ref >59)
GFR calc non Af Amer: 104 mL/min/{1.73_m2} (ref >59)
GLOBULIN, TOTAL: 2.8 g/dL (ref 1.5–4.5)
Glucose: 426 mg/dL — ABNORMAL HIGH (ref 65–99)
POTASSIUM: 3.5 mmol/L (ref 3.5–5.2)
SODIUM: 135 mmol/L (ref 134–144)
Total Protein: 6.9 g/dL (ref 6.0–8.5)

## 2017-11-02 LAB — LIPID PANEL
CHOLESTEROL TOTAL: 131 mg/dL (ref 100–199)
Chol/HDL Ratio: 4.7 ratio (ref 0.0–5.0)
HDL: 28 mg/dL — ABNORMAL LOW (ref >39)
LDL CALC: 39 mg/dL (ref 0–99)
TRIGLYCERIDES: 320 mg/dL — AB (ref 0–149)
VLDL Cholesterol Cal: 64 mg/dL — ABNORMAL HIGH (ref 5–40)

## 2017-11-08 ENCOUNTER — Encounter: Payer: Self-pay | Admitting: *Deleted

## 2018-01-31 ENCOUNTER — Encounter: Payer: Self-pay | Admitting: Emergency Medicine

## 2018-01-31 ENCOUNTER — Ambulatory Visit: Payer: 59 | Admitting: Emergency Medicine

## 2018-01-31 ENCOUNTER — Other Ambulatory Visit: Payer: Self-pay

## 2018-01-31 VITALS — BP 118/73 | HR 76 | Temp 97.8°F | Resp 16 | Ht 64.0 in | Wt 166.6 lb

## 2018-01-31 DIAGNOSIS — E1165 Type 2 diabetes mellitus with hyperglycemia: Secondary | ICD-10-CM

## 2018-01-31 DIAGNOSIS — I10 Essential (primary) hypertension: Secondary | ICD-10-CM | POA: Diagnosis not present

## 2018-01-31 DIAGNOSIS — E1169 Type 2 diabetes mellitus with other specified complication: Secondary | ICD-10-CM

## 2018-01-31 DIAGNOSIS — Z23 Encounter for immunization: Secondary | ICD-10-CM

## 2018-01-31 DIAGNOSIS — Z794 Long term (current) use of insulin: Secondary | ICD-10-CM

## 2018-01-31 DIAGNOSIS — E782 Mixed hyperlipidemia: Secondary | ICD-10-CM

## 2018-01-31 DIAGNOSIS — IMO0001 Reserved for inherently not codable concepts without codable children: Secondary | ICD-10-CM

## 2018-01-31 LAB — POCT GLYCOSYLATED HEMOGLOBIN (HGB A1C): Hemoglobin A1C: 8.5 % — AB (ref 4.0–5.6)

## 2018-01-31 LAB — GLUCOSE, POCT (MANUAL RESULT ENTRY): POC Glucose: 178 mg/dl — AB (ref 70–99)

## 2018-01-31 MED ORDER — ATORVASTATIN CALCIUM 40 MG PO TABS: 40.0000 mg | ORAL_TABLET | Freq: Every day | ORAL | 1 refills | Status: DC

## 2018-01-31 MED ORDER — LISINOPRIL 10 MG PO TABS: 10.0000 mg | ORAL_TABLET | Freq: Every day | ORAL | 3 refills | Status: DC

## 2018-01-31 MED ORDER — GLIPIZIDE 5 MG PO TABS: 5.0000 mg | ORAL_TABLET | Freq: Two times a day (BID) | ORAL | 1 refills | Status: DC

## 2018-01-31 NOTE — Progress Notes (Addendum)
Dontavius Keim 57 y.o.   Chief Complaint  Patient presents with  . Follow-up    diabetes, need refill on atorvastatin 40 mg, glipizide 5 mg,lisinopril 10 mg    HISTORY OF PRESENT ILLNESS: This is a 57 y.o. male with history of uncontrolled diabetes, hypertension, and dyslipidemia, here for follow-up.  Has no complaints or medical concerns.  Today he says has been compliant with medications and his schedule has been less hectic. Wt Readings from Last 3 Encounters:  01/31/18 166 lb 9.6 oz (75.6 kg)  11/01/17 162 lb 9.6 oz (73.8 kg)  07/27/17 156 lb 12.8 oz (71.1 kg)   BP Readings from Last 3 Encounters:  01/31/18 118/73  11/01/17 132/72  07/27/17 116/69    The 10-year ASCVD risk score Mikey Bussing DC Jr., et al., 2013) is: 20.9%   Values used to calculate the score:     Age: 63 years     Sex: Male     Is Non-Hispanic African American: No     Diabetic: Yes     Tobacco smoker: Yes     Systolic Blood Pressure: 373 mmHg     Is BP treated: Yes     HDL Cholesterol: 28 mg/dL     Total Cholesterol: 131 mg/dL  HPI   Prior to Admission medications   Medication Sig Start Date End Date Taking? Authorizing Provider  atorvastatin (LIPITOR) 40 MG tablet Take 1 tablet (40 mg total) by mouth daily. 11/01/17  Yes Danuel Felicetti, Ines Bloomer, MD  Blood Glucose Monitoring Suppl (ONE TOUCH ULTRA MINI) W/DEVICE KIT 1 each by Does not apply route daily. Test fasting daily   Yes [provider]  CETIRIZINE HCL PO Take by mouth daily.   Yes [provider]  insulin detemir (LEVEMIR) 100 UNIT/ML injection Inject 0.2 mLs (20 Units total) into the skin 2 (two) times daily. 11/01/17  Yes Caytlyn Evers, Ines Bloomer, MD  insulin regular (HUMULIN R) 100 units/mL injection Inject 0.1 mLs (10 Units total) into the skin 3 (three) times daily before meals. 11/01/17  Yes Yvone Slape, Ines Bloomer, MD  lisinopril (PRINIVIL,ZESTRIL) 10 MG tablet Take 1 tablet (10 mg total) by mouth daily. 11/01/17  Yes Horald Pollen, MD  metFORMIN (GLUCOPHAGE) 500 MG tablet Take 1 tablet (500 mg total) by mouth 2 (two) times daily with a meal. 11/01/17  Yes Benedicta Sultan, Ines Bloomer, MD  glipiZIDE (GLUCOTROL) 5 MG tablet Take 1 tablet (5 mg total) by mouth 2 (two) times daily before a meal. 11/01/17 01/30/18  Horald Pollen, MD  SitaGLIPtin-MetFORMIN HCl 50-1000 MG TB24 Take 1 tablet by mouth 2 (two) times daily with a meal. 07/27/17 10/25/17  Horald Pollen, MD    No Known Allergies  Patient Active Problem List   Diagnosis Date Noted  . Uncontrolled type 2 diabetes mellitus with hyperglycemia ([place]) 07/27/2017  . Urinary urgency 10/19/2016  . Essential hypertension 03/06/2016  . Acute kidney injury ([place]) 03/06/2016  . Urinary retention 03/06/2016  . Uncontrolled type 2 diabetes mellitus with complication ([place])   . HHNC (hyperglycemic hyperosmolar nonketotic coma) ([place])   . Dyslipidemia   . Hyperglycemia 03/05/2016  . Type 2 diabetes mellitus with hyperglycemia ([place]) 03/05/2016  . 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 Dyslipidemia with low high density lipoprotein (HDL) cholesterol with hypertriglyceridemia due to type 2 diabetes mellitus ([place]) 11/03/2013  . Gall bladder polyp 10/24/2013  . GERD (gastroesophageal reflux disease) 10/24/2013  . Uncontrolled type 2 diabetes mellitus without complication, with long-term current use of  insulin ([place]) 10/23/2013    Past Medical History:  Diagnosis Date  . Diabetes mellitus 2011  . High triglycerides     Past Surgical History:  Procedure Laterality Date  . COLONOSCOPY N/A 10/24/2013   Procedure: COLONOSCOPY;  Surgeon: Ladene Artist, MD;  Location: [facility] ENDOSCOPY;  Service: Endoscopy;  Laterality: N/A;. diverticulosis, mild, small internal hemorrhoids    Social History   Socioeconomic History  . Marital status: Married    Spouse name: Not on file  . Number of children: Not on file  . Years of education: Not on file  . Highest education level: Not on file    Occupational History  . Not on file  Social Needs  . Financial resource strain: Not on file  . Food insecurity:    Worry: Not on file    Inability: Not on file  . Transportation needs:    Medical: Not on file    Non-medical: Not on file  Tobacco Use  . Smoking status: Current Every Day Smoker    Packs/day: 0.50    Years: 30.00    Pack years: 15.00    Types: Cigarettes  . Smokeless tobacco: Never Used  Substance and Sexual Activity  . Alcohol use: No  . Drug use: No  . Sexual activity: Not on file  Lifestyle  . Physical activity:    Days per week: Not on file    Minutes per session: Not on file  . Stress: Not on file  Relationships  . Social connections:    Talks on phone: Not on file    Gets together: Not on file    Attends religious service: Not on file    Active member of club or organization: Not on file    Attends meetings of clubs or organizations: Not on file    Relationship status: Not on file  . Intimate partner violence:    Fear of current or ex partner: Not on file    Emotionally abused: Not on file    Physically abused: Not on file    Forced sexual activity: Not on file  Other Topics Concern  . Not on file  Social History Narrative  . Not on file    Family History  Problem Relation Age of Onset  . Diabetes Mother   . Diabetes Sister   . Diabetes Brother      Review of Systems  Constitutional: Negative.  Negative for chills and fever.  HENT: Negative.  Negative for sore throat.   Eyes: Negative.  Negative for blurred vision and double vision.  Respiratory: Negative.  Negative for cough and shortness of breath.   Cardiovascular: Negative.  Negative for chest pain and palpitations.  Gastrointestinal: Negative.  Negative for abdominal pain, diarrhea, nausea and vomiting.  Genitourinary: Negative.  Negative for dysuria and hematuria.  Musculoskeletal: Negative.  Negative for back pain, myalgias and neck pain.  Skin: Negative.  Negative for rash.   Neurological: Negative.  Negative for dizziness and headaches.  Endo/Heme/Allergies: Negative.   All other systems reviewed and are negative.  Vitals:   01/31/18 1456  BP: 118/73  Pulse: 76  Resp: 16  Temp: 97.8 F (36.6 C)  SpO2: 97%     Physical Exam  Constitutional: He is oriented to person, place, and time. He appears well-developed and well-nourished.  HENT:  Head: Normocephalic and atraumatic.  Nose: Nose normal.  Mouth/Throat: Oropharynx is clear and moist.  Eyes: Pupils are equal, round, and reactive to light. Conjunctivae and  EOM are normal.  Neck: Normal range of motion. Neck supple.  Cardiovascular: Normal rate, regular rhythm and normal heart sounds.  Pulmonary/Chest: Effort normal and breath sounds normal.  Abdominal: Soft. Bowel sounds are normal. He exhibits no distension. There is no tenderness.  Musculoskeletal: Normal range of motion.  Neurological: He is alert and oriented to person, place, and time. No sensory deficit. He exhibits normal muscle tone.  Skin: Skin is warm and dry. Capillary refill takes less than 2 seconds.  Psychiatric: He has a normal mood and affect. His behavior is normal.  Vitals reviewed.  Results for orders placed or performed in visit on 01/31/18 (from the past 24 hour(s))  POCT glucose (manual entry)     Status: Abnormal   Collection Time: 01/31/18  3:23 PM  Result Value Ref Range   POC Glucose 178 (A) 70 - 99 mg/dl  POCT glycosylated hemoglobin (Hb A1C)     Status: Abnormal   Collection Time: 01/31/18  3:32 PM  Result Value Ref Range   Hemoglobin A1C 8.5 (A) 4.0 - 5.6 %   HbA1c POC (<> result, manual entry)     HbA1c, POC (prediabetic range)     HbA1c, POC (controlled diabetic range)       ASSESSMENT & PLAN: Essential hypertension Under control.  Continue present medication.  Follow-up in 3 months.  Uncontrolled type 2 diabetes mellitus without complication, with long-term current use of insulin (HCC) Best hemoglobin  A1c today in the past 1 to 2 years.  Shows compliance with medications and diet.  Continue present treatment and follow-up in 3 months.  Rossie was seen today for follow-up.  Diagnoses and all orders for this visit:  Type 2 diabetes mellitus with hyperglycemia, with long-term current use of insulin (HCC) -     POCT glucose (manual entry) -     POCT glycosylated hemoglobin (Hb A1C) -     glipiZIDE (GLUCOTROL) 5 MG tablet; Take 1 tablet (5 mg total) by mouth 2 (two) times daily before a meal.  Dyslipidemia with low high density lipoprotein (HDL) cholesterol with hypertriglyceridemia due to type 2 diabetes mellitus (HCC) -     atorvastatin (LIPITOR) 40 MG tablet; Take 1 tablet (40 mg total) by mouth daily.  Essential hypertension -     lisinopril (PRINIVIL,ZESTRIL) 10 MG tablet; Take 1 tablet (10 mg total) by mouth daily.  Uncontrolled type 2 diabetes mellitus without complication, with long-term current use of insulin (HCC)  Other orders -     Flu Vaccine QUAD 6+ mos PF IM (Fluarix Quad PF)    Patient Instructions       If you have lab work done today you will be contacted with your lab results within the next 2 weeks.  If you have not heard from [place] then please contact us. The fastest way to get your results is to register for My Chart.   IF you received an x-ray today, you will receive an invoice from [facility]. Please contact [facility] at [phone] with questions or concerns regarding your invoice.   IF you received labwork today, you will receive an invoice from [place]. Please contact [facility] at [phone] with questions or concerns regarding your invoice.   Our billing staff will not be able to assist you with questions regarding bills from these companies.  You will be contacted with the lab results as soon as they are available. The fastest way to get your results is to activate your My Chart account. Instructions  are located on the last page of  this paperwork. If you have not heard from [place] regarding the results in 2 weeks, please contact this office.      Diabetes mellitus y nutricin Diabetes Mellitus and Nutrition Si sufre de diabetes (diabetes mellitus), es muy importante tener hbitos alimenticios saludables debido a que sus niveles de Designer, television/film set sangre (glucosa) se ven afectados en gran medida por lo que come y bebe. [place] alimentos saludables en las cantidades [place], aproximadamente a la [employer], [place] ayudar a:  Aeronautical engineer glucemia.  Disminuir el riesgo de sufrir una enfermedad cardaca.  Mejorar la presin arterial.  Science writer o mantener un peso saludable.  Todas las personas que sufren de diabetes son diferentes y cada una tiene necesidades diferentes en cuanto a un plan de alimentacin. El mdico puede recomendarle que trabaje con un especialista en dietas y nutricin (nutricionista) para Financial trader plan para usted. Su plan de alimentacin puede variar segn factores como:  Las caloras que necesita.  Los medicamentos que toma.  Su peso.  Sus niveles de glucemia, presin arterial y colesterol.  Su nivel de [place].  Otras afecciones que tenga, como enfermedades cardacas o renales.  Cmo me afectan los carbohidratos? Los carbohidratos afectan el nivel de glucemia ms que cualquier otro tipo de alimento. La ingesta de carbohidratos naturalmente aumenta la cantidad glucosa en la sangre. El recuento de carbohidratos es un mtodo destinado a Catering manager un registro de la cantidad de carbohidratos que se ingieren. El recuento de carbohidratos es importante para Theatre manager la glucemia a un nivel saludable, en especial si utiliza insulina o toma determinados medicamentos por va oral para la diabetes. Es importante saber la cantidad de carbohidratos que se pueden ingerir en cada comida sin correr Engineer, manufacturing. Esto es Psychologist, forensic. El nutricionista puede ayudarlo a calcular la  cantidad de carbohidratos que debe ingerir en cada comida y colacin. Los alimentos que contienen carbohidratos incluyen:  Pan, cereal, arroz, pasta y galletas.  Papas y maz.  Guisantes, frijoles y lentejas.  Leche y Estate agent.  Lambert Mody y [place].  Postres, como pasteles, galletitas, helado y caramelos.  Cmo me afecta el alcohol? El alcohol puede provocar disminuciones sbitas de la glucemia (hipoglucemia), en especial si utiliza insulina o toma determinados medicamentos por va oral para la diabetes. La hipoglucemia es una afeccin potencialmente mortal. Los sntomas de la hipoglucemia (somnolencia, mareos y confusin) son similares a los sntomas de haber consumido demasiado alcohol. Si el mdico afirma que el alcohol es seguro para usted, siga estas pautas:  Limite el consumo de alcohol a no ms de 1 medida por da si es mujer y no est Music therapist, y a 2 medidas si es hombre. Una medida equivale a 12oz (343m) de cerveza, 5oz (1423m de vino o 1oz (4455mde bebidas de alta graduacin alcohlica.  No beba con el estmago vaco.  Mantngase hidratado con agua, gaseosas dietticas o t helado sin azcar.  Tenga en cuenta que las gaseosas comunes, los jugos y otros refrescos pueden contener mucha azcar y se deben contar como carbohidratos.  Consejos para seguir estPhotographers etiquetas de los alimentos  Comience por controlar el tamao de la porcin en la etiqueta. La cantidad de caloras, carbohidratos, grasas y otros nutrientes mencionados en la etiqueta se basan en una porcin del alimento. Muchos alimentos contienen ms de una porcin por envase.  Verifique la cantidad total de gramos (g) de carbohidratos totales en una porcin.  Puede calcular la cantidad de porciones de carbohidratos al dividir el total de carbohidratos por 15. Por ejemplo, si un alimento posee un total de 30g de carbohidratos, equivale a 2 porciones de carbohidratos.  Verifique la cantidad de gramos (g)  de grasas saturadas y grasas trans en una porcin. Escoja alimentos que no contengan grasa o que tengan un bajo contenido.  Controle la cantidad de miligramos (mg) de sodio en una porcin. [place] [employer] de las personas deben limitar la ingesta de sodio total a menos de 2364m por dTraining and development officer  Siempre consulte la informacin nutricional de los alimentos etiquetados como "con bajo contenido de grasa" o "sin grasa". Estos alimentos pueden ser ms altos en azcar agregada o en carbohidratos refinados y deben evitarse.  Hable con el nutricionista para identificar sus objetivos diarios en cuanto a los nutrientes mencionados en la etiqueta. De compras  Evite comprar alimentos procesados, enlatados o prehechos. Estos alimentos tienden a tTEFL teachercantidad de gCarnelian Bay sodio y azcar agregada.  Compre en la zona exterior de la tienda de comestibles. Esta incluye frutas y vNorthrop Grumman granos a granel, carnes frescas y productos lcteos frescos. Coccin  Utilice mtodos de coccin a baja temperatura, como hornear, en lugar de mtodos de coccin a alta temperatura, como frer en abundante aceite.  Cocine con aceites saludables, como el aceite de o[place] canola o gL[place]  Evite cocinar con manteca, crema o carnes con alto contenido de grasa. Planificacin de las comidas  CInternational Papercomidas y las colaciones de forma regular, preferentemente a la misma hora todos lAdams[place] Evite pasar largos perodos de tiempo sin comer.  Consuma alimentos ricos en fibra, como frutas frescas, verduras, frijoles y cereales integrales. Consulte al nutricionista sobre cuntas porciones de carbohidratos puede consumir en cada comida.  Consuma entre 4 y 6 onzas de protenas magras por da, como carnes [place] pollo, pescado, hFirst Data Corporationo tofu. 1 onza equivale a 1 onza de carne, pollo o pescado, 1 huevo, o 1/4 taza de tofu.  Coma algunos alimentos por da que contengan grasas saludables, como aguacates, frutos secos, semillas y  pescado. Estilo de vida   Controle su nivel de glucemia con regularidad.  Haga ejercicio al menos 373mutos, 5das o ms por semana, o como se lo haya indicado el mdico.  Tome los meTenneco Ince lo haya indicado el mdico.  No consuma ningn producto que contenga nicotina o tabaco, como cigarrillos y ciPsychologist, sport and exerciseSi necesita ayuda para dejar de fumar, consulte al mdHess Corporationon un asesor o instructor en diabetes para identificar estrategias para controlar el estrs y cualquier desafo emocional y social. Cules son algunas de las preguntas que puedo hacerle a mi mdico?  Es necesario que me rena con unRadio broadcast assistantn diabetes?  Es necesario que me rena con un nutricionista?  A qu nmero puedo llamar si tengo preguntas?  Cules son los mejores momentos para controlar la glucemia? Dnde encontrar ms informacin:  Asociacin Americana de la Diabetes (American Diabetes Association): diabetes.org/food-and-fitness/food  Academia de Nutricin y DiInformation systems managerAcademy of Nutrition and Dietetics): wwPokerClues.dkInSpencerportiabetes y laFort Shaw ReWater quality scientistNTruman Medical Center - Lakewoodf Diabetes and Digestive and Kidney Diseases) (InBonners FerryNIH): wwContactWire.beesumen  Un plan de alimentacin saludable lo ayudar a coAeronautical engineerlucemia y maTheatre managern estilo de vida saludable.  Trabajar con un especialista en dietas y nutricin (nutricionista) puede ayudarlo a elInsurance claims handlere alimentacin para usted.  Tenga en cuenta que los  carbohidratos y el alcohol tienen efectos inmediatos en sus niveles de glucemia. Es importante contar los carbohidratos y consumir alcohol con prudencia. Esta informacin no tiene Marine scientist el consejo del mdico. Asegrese de hacerle al mdico cualquier  pregunta que tenga. Document Released: 05/29/2007 Document Revised: 06/11/2016 Document Reviewed: 06/11/2016 Elsevier Interactive Patient Education  2018 Elsevier Inc.      Agustina Caroli, MD [facility]

## 2018-01-31 NOTE — Patient Instructions (Addendum)
   If you have lab work done today you will be contacted with your lab results within the next 2 weeks.  If you have not heard from us then please contact us. The fastest way to get your results is to register for My Chart.   IF you received an x-ray today, you will receive an invoice from [facility]. Please contact [facility] at [phone] with questions or concerns regarding your invoice.   IF you received labwork today, you will receive an invoice from [facility]. Please contact [facility] at [phone] with questions or concerns regarding your invoice.   Our billing staff will not be able to assist you with questions regarding bills from these companies.  You will be contacted with the lab results as soon as they are available. The fastest way to get your results is to activate your My Chart account. Instructions are located on the last page of this paperwork. If you have not heard from us regarding the results in 2 weeks, please contact this office.     Diabetes mellitus y nutricin Diabetes Mellitus and Nutrition Si sufre de diabetes (diabetes mellitus), es muy importante tener hbitos alimenticios saludables debido a que sus niveles de azcar en la sangre (glucosa) se ven afectados en gran medida por lo que come y bebe. Comer alimentos saludables en las cantidades adecuadas, aproximadamente a la misma hora todos los das, lo ayudar a:  Controlar la glucemia.  Disminuir el riesgo de sufrir una enfermedad cardaca.  Mejorar la presin arterial.  Alcanzar o mantener un peso saludable.  Todas las personas que sufren de diabetes son diferentes y cada una tiene necesidades diferentes en cuanto a un plan de alimentacin. El mdico puede recomendarle que trabaje con un especialista en dietas y nutricin (nutricionista) para elaborar el mejor plan para usted. Su plan de alimentacin puede variar segn factores como:  Las caloras que necesita.  Los medicamentos  que toma.  Su peso.  Sus niveles de glucemia, presin arterial y colesterol.  Su nivel de actividad.  Otras afecciones que tenga, como enfermedades cardacas o renales.  Cmo me afectan los carbohidratos? Los carbohidratos afectan el nivel de glucemia ms que cualquier otro tipo de alimento. La ingesta de carbohidratos naturalmente aumenta la cantidad glucosa en la sangre. El recuento de carbohidratos es un mtodo destinado a llevar un registro de la cantidad de carbohidratos que se ingieren. El recuento de carbohidratos es importante para mantener la glucemia a un nivel saludable, en especial si utiliza insulina o toma determinados medicamentos por va oral para la diabetes. Es importante saber la cantidad de carbohidratos que se pueden ingerir en cada comida sin correr ningn riesgo. Esto es diferente en cada persona. El nutricionista puede ayudarlo a calcular la cantidad de carbohidratos que debe ingerir en cada comida y colacin. Los alimentos que contienen carbohidratos incluyen:  Pan, cereal, arroz, pasta y galletas.  Papas y maz.  Guisantes, frijoles y lentejas.  Leche y yogur.  Frutas y jugo.  Postres, como pasteles, galletitas, helado y caramelos.  Cmo me afecta el alcohol? El alcohol puede provocar disminuciones sbitas de la glucemia (hipoglucemia), en especial si utiliza insulina o toma determinados medicamentos por va oral para la diabetes. La hipoglucemia es una afeccin potencialmente mortal. Los sntomas de la hipoglucemia (somnolencia, mareos y confusin) son similares a los sntomas de haber consumido demasiado alcohol. Si el mdico afirma que el alcohol es seguro para usted, siga estas pautas:  Limite el consumo de alcohol a no   ms de 1 medida por da si es mujer y no est embarazada, y a 2 medidas si es hombre. Una medida equivale a 12oz (355ml) de cerveza, 5oz (148ml) de vino o 1oz (44ml) de bebidas de alta graduacin alcohlica.  No beba con el  estmago vaco.  Mantngase hidratado con agua, gaseosas dietticas o t helado sin azcar.  Tenga en cuenta que las gaseosas comunes, los jugos y otros refrescos pueden contener mucha azcar y se deben contar como carbohidratos.  Consejos para seguir este plan Leer las etiquetas de los alimentos  Comience por controlar el tamao de la porcin en la etiqueta. La cantidad de caloras, carbohidratos, grasas y otros nutrientes mencionados en la etiqueta se basan en una porcin del alimento. Muchos alimentos contienen ms de una porcin por envase.  Verifique la cantidad total de gramos (g) de carbohidratos totales en una porcin. Puede calcular la cantidad de porciones de carbohidratos al dividir el total de carbohidratos por 15. Por ejemplo, si un alimento posee un total de 30g de carbohidratos, equivale a 2 porciones de carbohidratos.  Verifique la cantidad de gramos (g) de grasas saturadas y grasas trans en una porcin. Escoja alimentos que no contengan grasa o que tengan un bajo contenido.  Controle la cantidad de miligramos (mg) de sodio en una porcin. La mayora de las personas deben limitar la ingesta de sodio total a menos de 2300mg por da.  Siempre consulte la informacin nutricional de los alimentos etiquetados como "con bajo contenido de grasa" o "sin grasa". Estos alimentos pueden ser ms altos en azcar agregada o en carbohidratos refinados y deben evitarse.  Hable con el nutricionista para identificar sus objetivos diarios en cuanto a los nutrientes mencionados en la etiqueta. De compras  Evite comprar alimentos procesados, enlatados o prehechos. Estos alimentos tienden a tener mayor cantidad de grasa, sodio y azcar agregada.  Compre en la zona exterior de la tienda de comestibles. Esta incluye frutas y vegetales frescos, granos a granel, carnes frescas y productos lcteos frescos. Coccin  Utilice mtodos de coccin a baja temperatura, como hornear, en lugar de mtodos de  coccin a alta temperatura, como frer en abundante aceite.  Cocine con aceites saludables, como el aceite de oliva, canola o girasol.  Evite cocinar con manteca, crema o carnes con alto contenido de grasa. Planificacin de las comidas  Consuma las comidas y las colaciones de forma regular, preferentemente a la misma hora todos los das. Evite pasar largos perodos de tiempo sin comer.  Consuma alimentos ricos en fibra, como frutas frescas, verduras, frijoles y cereales integrales. Consulte al nutricionista sobre cuntas porciones de carbohidratos puede consumir en cada comida.  Consuma entre 4 y 6 onzas de protenas magras por da, como carnes magras, pollo, pescado, huevos o tofu. 1 onza equivale a 1 onza de carne, pollo o pescado, 1 huevo, o 1/4 taza de tofu.  Coma algunos alimentos por da que contengan grasas saludables, como aguacates, frutos secos, semillas y pescado. Estilo de vida   Controle su nivel de glucemia con regularidad.  Haga ejercicio al menos 30minutos, 5das o ms por semana, o como se lo haya indicado el mdico.  Tome los medicamentos como se lo haya indicado el mdico.  No consuma ningn producto que contenga nicotina o tabaco, como cigarrillos y cigarrillos electrnicos. Si necesita ayuda para dejar de fumar, consulte al mdico.  Trabaje con un asesor o instructor en diabetes para identificar estrategias para controlar el estrs y cualquier desafo emocional y social. Cules   son algunas de las preguntas que puedo hacerle a mi mdico?  Es necesario que me rena con un instructor en diabetes?  Es necesario que me rena con un nutricionista?  A qu nmero puedo llamar si tengo preguntas?  Cules son los mejores momentos para controlar la glucemia? Dnde encontrar ms informacin:  Asociacin Americana de la Diabetes (American Diabetes Association): diabetes.org/food-and-fitness/food  Academia de Nutricin y Diettica (Academy of Nutrition and  Dietetics): www.eatright.org/resources/health/diseases-and-conditions/diabetes  Instituto Nacional de la Diabetes y las Enfermedades Digestivas y Renales (National Institute of Diabetes and Digestive and Kidney Diseases) (Institutos Nacionales de Salud, NIH): www.niddk.nih.gov/health-information/diabetes/overview/diet-eating-physical-activity Resumen  Un plan de alimentacin saludable lo ayudar a controlar la glucemia y mantener un estilo de vida saludable.  Trabajar con un especialista en dietas y nutricin (nutricionista) puede ayudarlo a elaborar el mejor plan de alimentacin para usted.  Tenga en cuenta que los carbohidratos y el alcohol tienen efectos inmediatos en sus niveles de glucemia. Es importante contar los carbohidratos y consumir alcohol con prudencia. Esta informacin no tiene como fin reemplazar el consejo del mdico. Asegrese de hacerle al mdico cualquier pregunta que tenga. Document Released: 05/29/2007 Document Revised: 06/11/2016 Document Reviewed: 06/11/2016 Elsevier Interactive Patient Education  2018 [employer].  

## 2018-01-31 NOTE — Assessment & Plan Note (Signed)
Under control.  Continue present medication.  Follow-up in 3 months.

## 2018-01-31 NOTE — Addendum Note (Signed)
Addended by: Evie LacksSAGARDIA, Muneeb Veras J on: 01/31/2018 04:28 PM   Modules accepted: Orders

## 2018-01-31 NOTE — Assessment & Plan Note (Signed)
Best hemoglobin A1c today in the past 1 to 2 years.  Shows compliance with medications and diet.  Continue present treatment and follow-up in 3 months.

## 2018-04-18 ENCOUNTER — Other Ambulatory Visit (HOSPITAL_COMMUNITY): Payer: Self-pay | Admitting: Emergency Medicine

## 2018-04-18 NOTE — Telephone Encounter (Signed)
Requested medication (s) are due for refill today not current on list  Requested medication (s) are on the active medication list -no  Future visit scheduled -yes  Last refill: 2 years  Notes to clinic: Patient is requesting diabetic supplies written by outside provider- sent for review of request and new Rx.  Requested Prescriptions  Pending Prescriptions Disp Refills   Insulin Syringe-Needle U-100 (INSULIN SYRINGE .5CC/31GX5/16") 31G X 5/16" 0.5 ML MISC [Pharmacy Med Name: [facility] SYR/NDL 31G 0.5ML 8MM] 100 each     Sig: ADMINISTER INSULIN AS INSTRUCTED     There is no refill protocol information for this order       Requested Prescriptions  Pending Prescriptions Disp Refills   Insulin Syringe-Needle U-100 (INSULIN SYRINGE .5CC/31GX5/16") 31G X 5/16" 0.5 ML MISC [Pharmacy Med Name: [facility] SYR/NDL 31G 0.5ML 8MM] 100 each     Sig: ADMINISTER INSULIN AS INSTRUCTED     There is no refill protocol information for this order

## 2018-05-09 ENCOUNTER — Other Ambulatory Visit: Payer: Self-pay

## 2018-05-09 ENCOUNTER — Encounter: Payer: Self-pay | Admitting: Emergency Medicine

## 2018-05-09 ENCOUNTER — Ambulatory Visit: Payer: 59 | Admitting: Emergency Medicine

## 2018-05-09 VITALS — BP 133/75 | HR 60 | Temp 98.6°F | Resp 16 | Wt 163.6 lb

## 2018-05-09 DIAGNOSIS — E1165 Type 2 diabetes mellitus with hyperglycemia: Secondary | ICD-10-CM | POA: Diagnosis not present

## 2018-05-09 DIAGNOSIS — I10 Essential (primary) hypertension: Secondary | ICD-10-CM | POA: Diagnosis not present

## 2018-05-09 DIAGNOSIS — Z794 Long term (current) use of insulin: Secondary | ICD-10-CM | POA: Diagnosis not present

## 2018-05-09 LAB — GLUCOSE, POCT (MANUAL RESULT ENTRY): POC Glucose: 185 mg/dl — AB (ref 70–99)

## 2018-05-09 LAB — POCT GLYCOSYLATED HEMOGLOBIN (HGB A1C): Hemoglobin A1C: 9.8 % — AB (ref 4.0–5.6)

## 2018-05-09 MED ORDER — METFORMIN HCL 1000 MG PO TABS: 1000.0000 mg | ORAL_TABLET | Freq: Two times a day (BID) | ORAL | 3 refills | Status: DC

## 2018-05-09 MED ORDER — GLIPIZIDE 5 MG PO TABS: 10.0000 mg | ORAL_TABLET | Freq: Two times a day (BID) | ORAL | 1 refills | Status: DC

## 2018-05-09 MED ORDER — LISINOPRIL 10 MG PO TABS: 10.0000 mg | ORAL_TABLET | Freq: Every day | ORAL | 3 refills | Status: DC

## 2018-05-09 NOTE — Progress Notes (Signed)
Lab Results  Component Value Date   HGBA1C 8.5 (A) 01/31/2018   BP Readings from Last 3 Encounters:  05/09/18 133/75  01/31/18 118/73  11/01/17 132/72   Lab Results  Component Value Date   CHOL 131 11/01/2017   HDL 28 (L) 11/01/2017   LDLCALC 39 11/01/2017   TRIG 320 (H) 11/01/2017   CHOLHDL 4.7 11/01/2017   Joseph Roberts 58 y.o.   Chief Complaint  Patient presents with  . Diabetes    follow up x 4 months  . Medication Refill    Lisinopril    HISTORY OF PRESENT ILLNESS: This is a 58 y.o. male with history of diabetes here for follow-up.  Doing well.  Has no complaints or medical concerns.  Compliant with diet and medications.  HPI   Prior to Admission medications   Medication Sig Start Date End Date Taking? Authorizing Provider  atorvastatin (LIPITOR) 40 MG tablet Take 1 tablet (40 mg total) by mouth daily. 01/31/18  Yes Aine Strycharz, Ines Bloomer, MD  Blood Glucose Monitoring Suppl (ONE TOUCH ULTRA MINI) W/DEVICE KIT 1 each by Does not apply route daily. Test fasting daily   Yes [provider]  insulin detemir (LEVEMIR) 100 UNIT/ML injection Inject 0.2 mLs (20 Units total) into the skin 2 (two) times daily. 11/01/17  Yes Shulamit Donofrio, Ines Bloomer, MD  insulin regular (HUMULIN R) 100 units/mL injection Inject 0.1 mLs (10 Units total) into the skin 3 (three) times daily before meals. 11/01/17  Yes Ameirah Khatoon, Ines Bloomer, MD  lisinopril (PRINIVIL,ZESTRIL) 10 MG tablet Take 1 tablet (10 mg total) by mouth daily. 05/09/18  Yes Chanee Henrickson, Ines Bloomer, MD  metFORMIN (GLUCOPHAGE) 500 MG tablet Take 1 tablet (500 mg total) by mouth 2 (two) times daily with a meal. 11/01/17  Yes Ariyanah Aguado, Ines Bloomer, MD  CETIRIZINE HCL PO Take by mouth daily.    [provider]  glipiZIDE (GLUCOTROL) 5 MG tablet Take 1 tablet (5 mg total) by mouth 2 (two) times daily before a meal. 01/31/18 05/01/18  [place], Ines Bloomer, MD  Insulin Syringe-Needle U-100 (INSULIN SYRINGE .5CC/31GX5/16") 31G X  5/16" 0.5 ML MISC ADMINISTER INSULIN AS INSTRUCTED 04/18/18   Horald Pollen, MD    No Known Allergies  Patient Active Problem List   Diagnosis Date Noted  . Uncontrolled type 2 diabetes mellitus with hyperglycemia ([place]) 07/27/2017  . Urinary urgency 10/19/2016  . Essential hypertension 03/06/2016  . Acute kidney injury ([place]) 03/06/2016  . Urinary retention 03/06/2016  . Uncontrolled type 2 diabetes mellitus with complication ([place])   . HHNC (hyperglycemic hyperosmolar nonketotic coma) ([place])   . Dyslipidemia   . Hyperglycemia 03/05/2016  . Type 2 diabetes mellitus with hyperglycemia ([place]) 03/05/2016  . 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 Dyslipidemia with low high density lipoprotein (HDL) cholesterol with hypertriglyceridemia due to type 2 diabetes mellitus ([place]) 11/03/2013  . Gall bladder polyp 10/24/2013  . GERD (gastroesophageal reflux disease) 10/24/2013  . Uncontrolled type 2 diabetes mellitus without complication, with long-term current use of insulin ([place]) 10/23/2013    Past Medical History:  Diagnosis Date  . Diabetes mellitus 2011  . High triglycerides     Past Surgical History:  Procedure Laterality Date  . COLONOSCOPY N/A 10/24/2013   Procedure: COLONOSCOPY;  Surgeon: Ladene Artist, MD;  Location: [facility] ENDOSCOPY;  Service: Endoscopy;  Laterality: N/A;. diverticulosis, mild, small internal hemorrhoids    Social History   Socioeconomic History  . Marital status: Married    Spouse name: Not on file  .  Number of children: Not on file  . Years of education: Not on file  . Highest education level: Not on file  Occupational History  . Not on file  Social Needs  . Financial resource strain: Not on file  . Food insecurity:    Worry: Not on file    Inability: Not on file  . Transportation needs:    Medical: Not on file    Non-medical: Not on file  Tobacco Use  . Smoking status: Current Every Day Smoker    Packs/day: 0.50    Years: 30.00    Pack years: 15.00     Types: Cigarettes  . Smokeless tobacco: Never Used  Substance and Sexual Activity  . Alcohol use: No  . Drug use: No  . Sexual activity: Not on file  Lifestyle  . Physical activity:    Days per week: Not on file    Minutes per session: Not on file  . Stress: Not on file  Relationships  . Social connections:    Talks on phone: Not on file    Gets together: Not on file    Attends religious service: Not on file    Active member of club or organization: Not on file    Attends meetings of clubs or organizations: Not on file    Relationship status: Not on file  . Intimate partner violence:    Fear of current or ex partner: Not on file    Emotionally abused: Not on file    Physically abused: Not on file    Forced sexual activity: Not on file  Other Topics Concern  . Not on file  Social History Narrative  . Not on file    Family History  Problem Relation Age of Onset  . Diabetes Mother   . Diabetes Sister   . Diabetes Brother      Review of Systems  Constitutional: Negative.  Negative for chills and fever.  HENT: Negative.   Eyes: Negative.   Respiratory: Negative.  Negative for cough and shortness of breath.   Cardiovascular: Negative.  Negative for chest pain and palpitations.  Gastrointestinal: Negative.  Negative for abdominal pain, diarrhea, nausea and vomiting.  Genitourinary: Negative.   Skin: Negative.  Negative for rash.  Neurological: Negative.  Negative for dizziness and headaches.  Endo/Heme/Allergies: Negative.   All other systems reviewed and are negative.  Vitals:   05/09/18 1513  BP: 133/75  Pulse: 60  Resp: 16  Temp: 98.6 F (37 C)  SpO2: 98%     Physical Exam Vitals signs reviewed.  Constitutional:      Appearance: Normal appearance.  HENT:     Head: Normocephalic and atraumatic.     Mouth/Throat:     Mouth: Mucous membranes are moist.     Pharynx: Oropharynx is clear.  Eyes:     Extraocular Movements: Extraocular movements intact.      Conjunctiva/sclera: Conjunctivae normal.     Pupils: Pupils are equal, round, and reactive to light.  Neck:     Musculoskeletal: Normal range of motion and neck supple.  Cardiovascular:     Rate and Rhythm: Normal rate and regular rhythm.     Heart sounds: Normal heart sounds.  Pulmonary:     Effort: Pulmonary effort is normal.     Breath sounds: Normal breath sounds.  Abdominal:     Palpations: Abdomen is soft.     Tenderness: There is no abdominal tenderness.  Musculoskeletal: Normal range of motion.  Skin:  General: Skin is warm and dry.     Capillary Refill: Capillary refill takes less than 2 seconds.  Neurological:     General: No focal deficit present.     Mental Status: He is alert and oriented to person, place, and time.  Psychiatric:        Mood and Affect: Mood normal.        Behavior: Behavior normal.    Results for orders placed or performed in visit on 05/09/18 (from the past 24 hour(s))  POCT glucose (manual entry)     Status: Abnormal   Collection Time: 05/09/18  3:57 PM  Result Value Ref Range   POC Glucose 185 (A) 70 - 99 mg/dl  POCT glycosylated hemoglobin (Hb A1C)     Status: Abnormal   Collection Time: 05/09/18  4:04 PM  Result Value Ref Range   Hemoglobin A1C 9.8 (A) 4.0 - 5.6 %   HbA1c POC (<> result, manual entry)     HbA1c, POC (prediabetic range)     HbA1c, POC (controlled diabetic range)     A total of 40 minutes was spent in the room with the patient, greater than 50% of which was in counseling/coordination of care regarding uncontrolled diabetes diet and nutrition, changes in medication doses, medication review, blood results reviewed, need for follow-up in 3 months.   ASSESSMENT & PLAN: Uncontrolled type 2 diabetes mellitus with hyperglycemia (HCC) Hemoglobin A1c today higher than before at 9.8.  Will increase glipizide to 10 mg twice a day and increase metformin to 1000 mg twice a day.  Patient has been tolerating medications well.   Continue present insulin regimen.  Stressed diet and nutrition.  Patient aware of hypoglycemic symptoms.  Follow-up in 3 months.  Lebert was seen today for diabetes and medication refill.  Diagnoses and all orders for this visit:  Type 2 diabetes mellitus with hyperglycemia, with long-term current use of insulin (HCC) -     POCT glucose (manual entry) -     POCT glycosylated hemoglobin (Hb A1C) -     CBC with Differential/Platelet -     Comprehensive metabolic panel -     Lipid panel -     glipiZIDE (GLUCOTROL) 5 MG tablet; Take 2 tablets (10 mg total) by mouth 2 (two) times daily before a meal. -     metFORMIN (GLUCOPHAGE) 1000 MG tablet; Take 1 tablet (1,000 mg total) by mouth 2 (two) times daily with a meal.  Essential hypertension -     CBC with Differential/Platelet -     Comprehensive metabolic panel -     lisinopril (PRINIVIL,ZESTRIL) 10 MG tablet; Take 1 tablet (10 mg total) by mouth daily.  Uncontrolled type 2 diabetes mellitus with hyperglycemia [facility])      Patient Instructions       If you have lab work done today you will be contacted with your lab results within the next 2 weeks.  If you have not heard from [place] then please contact us. The fastest way to get your results is to register for My Chart.   IF you received an x-ray today, you will receive an invoice from [facility]. Please contact [facility] at [phone] with questions or concerns regarding your invoice.   IF you received labwork today, you will receive an invoice from [facility]. Please contact [facility] at [phone] with questions or concerns regarding your invoice.   Our billing staff will not be able to assist you with questions regarding bills from  these companies.  You will be contacted with the lab results as soon as they are available. The fastest way to get your results is to activate your My Chart account. Instructions are located on the last page of this paperwork. If you  have not heard from [place] regarding the results in 2 weeks, please contact this office.      Diabetes mellitus y nutricin, en adultos Diabetes Mellitus and Nutrition, Adult Si sufre de diabetes (diabetes mellitus), es muy importante tener hbitos alimenticios saludables debido a que sus niveles de Designer, television/film set sangre (glucosa) se ven afectados en gran medida por lo que come y bebe. [place] alimentos saludables en las cantidades [place], aproximadamente a la [employer], [place] ayudar a:  Aeronautical engineer glucemia.  Disminuir el riesgo de sufrir una enfermedad cardaca.  Mejorar la presin arterial.  Science writer o mantener un peso saludable. Todas las personas que sufren de diabetes son diferentes y cada una tiene necesidades diferentes en cuanto a un plan de alimentacin. El mdico puede recomendarle que trabaje con un especialista en dietas y nutricin (nutricionista) para Financial trader plan para usted. Su plan de alimentacin puede variar segn factores como:  Las caloras que necesita.  Los medicamentos que toma.  Su peso.  Sus niveles de glucemia, presin arterial y colesterol.  Su nivel de [place].  Otras afecciones que tenga, como enfermedades cardacas o renales. Cmo me afectan los carbohidratos? Los carbohidratos, o hidratos de carbono, afectan su nivel de glucemia ms que cualquier otro tipo de alimento. La ingesta de carbohidratos naturalmente aumenta la cantidad de [employer]. El recuento de carbohidratos es un mtodo destinado a Catering manager un registro de la cantidad de carbohidratos que se consumen. El recuento de carbohidratos es importante para Theatre manager la glucemia a un nivel saludable, especialmente si utiliza insulina o toma determinados medicamentos por va oral para la diabetes. Es importante conocer la cantidad de carbohidratos que se pueden ingerir en cada comida sin correr Engineer, manufacturing. Esto es Psychologist, forensic. Su nutricionista puede  ayudarlo a calcular la cantidad de carbohidratos que debe ingerir en cada comida y en cada refrigerio. Entre los alimentos que contienen carbohidratos, se incluyen:  Pan, cereal, arroz, pastas y galletas.  Papas y maz.  Guisantes, frijoles y lentejas.  Leche y Estate agent.  Lambert Mody y [place].  Postres, como pasteles, galletas, helado y caramelos. Cmo me afecta el alcohol? El alcohol puede provocar disminuciones sbitas de la glucemia (hipoglucemia), especialmente si utiliza insulina o toma determinados medicamentos por va oral para la diabetes. La hipoglucemia es una afeccin potencialmente mortal. Los sntomas de la hipoglucemia (somnolencia, mareos y confusin) son similares a los sntomas de haber consumido demasiado alcohol. Si el mdico afirma que el alcohol es seguro para usted, [place] estas pautas:  Limite el consumo de alcohol a no ms de 37mdida por da si es mujer y no est eLyndon Center y a [place] si es hombre. Una medida equivale a 12oz (35521mde cerveza, 5oz (148m74me vino o 1oz (44ml37m bebidas alcohlicas de alta graduacin.  No beba con el estmago vaco.  Mantngase hidratado bebiendo agua, refrescos dietticos o t helado sin azcar.  Tenga en cuenta que los refrescos comunes, los jugos y otras bebida para mezclOptician, dispensingen contener mucha azcar y se deben contar como carbohidratos. Cules son algunos consejos para seguir este plan?  Leer las etiquetas de los alimentos  Comience por leer el tamao de la porcin en la "Informacin nutricional"  en las etiquetas de los alimentos envasados y las bebidas. La cantidad de caloras, carbohidratos, grasas y otros nutrientes mencionados en la etiqueta se basan en una porcin del alimento. Muchos alimentos contienen ms de una porcin por envase.  Verifique la cantidad total de gramos (g) de carbohidratos totales en una porcin. Puede calcular la cantidad de porciones de carbohidratos al dividir el total de carbohidratos por  15. Por ejemplo, si un alimento tiene un total de 30g de carbohidratos, equivale a 2 porciones de carbohidratos.  Verifique la cantidad de gramos (g) de grasas saturadas y grasas trans en una porcin. Escoja alimentos que no contengan grasa o que tengan un bajo contenido.  Verifique la cantidad de miligramos (mg) de sal (sodio) en una porcin. [place] [employer] de las personas deben limitar la ingesta de sodio total a menos de 2337m por dTraining and development officer  Siempre consulte la informacin nutricional de los alimentos etiquetados como "con bajo contenido de grasa" o "sin grasa". Estos alimentos pueden tener un mayor contenido de aLocation manageragregada o carbohidratos refinados, y deben evitarse.  Hable con su nutricionista para identificar sus objetivos diarios en cuanto a los nutrientes mencionados en la etiqueta. Al ir de compras  Evite comprar alimentos procesados, enlatados o precocinados. Estos alimentos tienden a tSpecial educational needs teachermayor cantidad de gHope [place] sodio y azcar agregada.  Compre en la zona exterior de la tienda de comestibles. Esta zona incluye frutas y verduras frescas, granos a granel, carnes frescas y productos lcteos frescos. Al cocinar  Utilice mtodos de coccin a baja temperatura, como hornear, en lugar de mtodos de coccin a alta temperatura, como frer en abundante aceite.  Cocine con aceites saludables, como el aceite de oSt. Vincent canola o gNew [place]  Evite cocinar con manteca, crema o carnes con alto contenido de grasa. Planificacin de las comidas  Coma las comidas y los refrigerios regularmente, preferentemente a la misma hora todos lBig Lagoon Evite pasar largos perodos de tiempo sin comer.  Consuma alimentos ricos en fibra, como frutas frescas, verduras, frijoles y cereales integrales. Consulte a su nutricionista sobre cuntas porciones de carbohidratos puede consumir en cada comida.  Consuma entre 4 y 6 onzas (oz) de protenas magras por da, como carnes mMolena pollo, pescado, huevos o tofu. Una  onza de protena magra equivale a: ? 1 onza de carne, pollo o pescado. ? 1huevo. ?  taza de tofu.  Coma algunos alimentos por da que contengan grasas saludables, como aguacates, frutos secos, semillas y pescado. Estilo de vida  Controle su nivel de glucemia con regularidad.  Haga actividad fsica habitualmente como se lo haya indicado el mdico. Esto puede incluir lo siguiente: ? 1529mutos semanales de ejercicio de intensidad moderada o alta. Esto podra incluir caminatas dinmicas, ciclismo o gimnasia acutica. ? Realizar ejercicios de elongacin y de fortalecimiento, como yoga o levantamiento de pesas, por lo menos 2veces por semana.  Tome los meTenneco Ince lo haya indicado el mdico.  No consuma ningn producto que contenga nicotina o tabaco, como cigarrillos y ciPsychologist, sport and exerciseSi necesita ayuda para dejar de fumar, consulte al mdHess Corporationon un asesor o instructor en diabetes para identificar estrategias para controlar el estrs y cualquier desafo emocional y social. Preguntas para hacerle al mdico  Es necesario que consulte a unRadio broadcast assistantn el cuidado de la diabetes?  Es necesario que me rena con un nutricionista?  A qu nmero puedo llamar si tengo preguntas?  Cules son los mejores momentos para controlar la glucemia? Dnde encontrar ms informacin:  Asociacin Estadounidense de la Diabetes (American Diabetes Association): diabetes.org  Academia de Nutricin y Information systems manager (Academy of Nutrition and Dietetics): www.eatright.[place] Diabetes y las Enfermedades Digestivas y [place] [facility] of Diabetes and Digestive and Kidney Diseases, NIH): [place]Funeral.dk Resumen  Un plan de alimentacin saludable lo ayudar a Aeronautical engineer glucemia y Theatre manager un estilo de vida saludable.  Trabajar con un especialista en dietas y nutricin (nutricionista) puede ayudarlo a Insurance claims handler de alimentacin para  usted.  Tenga en cuenta que los carbohidratos (hidratos de carbono) y el alcohol tienen efectos inmediatos en sus niveles de glucemia. Es importante contar los carbohidratos que ingiere y consumir alcohol con prudencia. Esta informacin no tiene Marine scientist el consejo del mdico. Asegrese de hacerle al mdico cualquier pregunta que tenga. Document Released: 05/29/2007 Document Revised: 10/30/2016 Document Reviewed: 06/11/2016 Elsevier Interactive Patient Education  2019 Elsevier Inc.      Agustina Caroli, MD [facility]

## 2018-05-09 NOTE — Assessment & Plan Note (Signed)
Hemoglobin A1c today higher than before at 9.8.  Will increase glipizide to 10 mg twice a day and increase metformin to 1000 mg twice a day.  Patient has been tolerating medications well.  Continue present insulin regimen.  Stressed diet and nutrition.  Patient aware of hypoglycemic symptoms.  Follow-up in 3 months.

## 2018-05-09 NOTE — Patient Instructions (Addendum)
   If you have lab work done today you will be contacted with your lab results within the next 2 weeks.  If you have not heard from us then please contact us. The fastest way to get your results is to register for My Chart.   IF you received an x-ray today, you will receive an invoice from [facility]. Please contact [facility] at [phone] with questions or concerns regarding your invoice.   IF you received labwork today, you will receive an invoice from [facility]. Please contact [facility] at [phone] with questions or concerns regarding your invoice.   Our billing staff will not be able to assist you with questions regarding bills from these companies.  You will be contacted with the lab results as soon as they are available. The fastest way to get your results is to activate your My Chart account. Instructions are located on the last page of this paperwork. If you have not heard from us regarding the results in 2 weeks, please contact this office.     Diabetes mellitus y nutricin, en adultos Diabetes Mellitus and Nutrition, Adult Si sufre de diabetes (diabetes mellitus), es muy importante tener hbitos alimenticios saludables debido a que sus niveles de azcar en la sangre (glucosa) se ven afectados en gran medida por lo que come y bebe. [place]er alimentos saludables en las cantidades adecuadas, aproximadamente a la misma hora todos los das, lo ayudar a:  Controlar la glucemia.  Disminuir el riesgo de sufrir una enfermedad cardaca.  Mejorar la presin arterial.  Alcanzar o mantener un peso saludable. Todas las personas que sufren de diabetes son diferentes y cada una tiene necesidades diferentes en cuanto a un plan de alimentacin. El mdico puede recomendarle que trabaje con un especialista en dietas y nutricin (nutricionista) para elaborar el mejor plan para usted. Su plan de alimentacin puede variar segn factores como:  Las caloras que  necesita.  Los medicamentos que toma.  Su peso.  Sus niveles de glucemia, presin arterial y colesterol.  Su nivel de actividad.  Otras afecciones que tenga, como enfermedades cardacas o renales. Cmo me afectan los carbohidratos? Los carbohidratos, o hidratos de carbono, afectan su nivel de glucemia ms que cualquier otro tipo de alimento. La ingesta de carbohidratos naturalmente aumenta la cantidad de glucosa en la sangre. El recuento de carbohidratos es un mtodo destinado a llevar un registro de la cantidad de carbohidratos que se consumen. El recuento de carbohidratos es importante para mantener la glucemia a un nivel saludable, especialmente si utiliza insulina o toma determinados medicamentos por va oral para la diabetes. Es importante conocer la cantidad de carbohidratos que se pueden ingerir en cada comida sin correr ningn riesgo. Esto es diferente en cada persona. Su nutricionista puede ayudarlo a calcular la cantidad de carbohidratos que debe ingerir en cada comida y en cada refrigerio. Entre los alimentos que contienen carbohidratos, se incluyen:  Pan, cereal, arroz, pastas y galletas.  Papas y maz.  Guisantes, frijoles y lentejas.  Leche y yogur.  Frutas y jugo.  Postres, como pasteles, galletas, helado y caramelos. Cmo me afecta el alcohol? El alcohol puede provocar disminuciones sbitas de la glucemia (hipoglucemia), especialmente si utiliza insulina o toma determinados medicamentos por va oral para la diabetes. La hipoglucemia es una afeccin potencialmente mortal. Los sntomas de la hipoglucemia (somnolencia, mareos y confusin) son similares a los sntomas de haber consumido demasiado alcohol. Si el mdico afirma que el alcohol es seguro para usted, siga estas pautas:    Limite el consumo de alcohol a no ms de 1medida por da si es mujer y no est embarazada, y a 2medidas si es hombre. Una medida equivale a 12oz (355ml) de cerveza, 5oz (148ml) de vino o  1oz (44ml) de bebidas alcohlicas de alta graduacin.  No beba con el estmago vaco.  Mantngase hidratado bebiendo agua, refrescos dietticos o t helado sin azcar.  Tenga en cuenta que los refrescos comunes, los jugos y otras bebida para mezclar pueden contener mucha azcar y se deben contar como carbohidratos. Cules son algunos consejos para seguir este plan?  Leer las etiquetas de los alimentos  Comience por leer el tamao de la porcin en la "Informacin nutricional" en las etiquetas de los alimentos envasados y las bebidas. La cantidad de caloras, carbohidratos, grasas y otros nutrientes mencionados en la etiqueta se basan en una porcin del alimento. Muchos alimentos contienen ms de una porcin por envase.  Verifique la cantidad total de gramos (g) de carbohidratos totales en una porcin. Puede calcular la cantidad de porciones de carbohidratos al dividir el total de carbohidratos por 15. Por ejemplo, si un alimento tiene un total de 30g de carbohidratos, equivale a 2 porciones de carbohidratos.  Verifique la cantidad de gramos (g) de grasas saturadas y grasas trans en una porcin. Escoja alimentos que no contengan grasa o que tengan un bajo contenido.  Verifique la cantidad de miligramos (mg) de sal (sodio) en una porcin. La mayora de las personas deben limitar la ingesta de sodio total a menos de 2300mg por da.  Siempre consulte la informacin nutricional de los alimentos etiquetados como "con bajo contenido de grasa" o "sin grasa". Estos alimentos pueden tener un mayor contenido de azcar agregada o carbohidratos refinados, y deben evitarse.  Hable con su nutricionista para identificar sus objetivos diarios en cuanto a los nutrientes mencionados en la etiqueta. Al ir de compras  Evite comprar alimentos procesados, enlatados o precocinados. Estos alimentos tienden a tener una mayor cantidad de grasa, sodio y azcar agregada.  Compre en la zona exterior de la tienda de  comestibles. Esta zona incluye frutas y verduras frescas, granos a granel, carnes frescas y productos lcteos frescos. Al cocinar  Utilice mtodos de coccin a baja temperatura, como hornear, en lugar de mtodos de coccin a alta temperatura, como frer en abundante aceite.  Cocine con aceites saludables, como el aceite de oliva, canola o girasol.  Evite cocinar con manteca, crema o carnes con alto contenido de grasa. Planificacin de las comidas  Coma las comidas y los refrigerios regularmente, preferentemente a la misma hora todos los das. Evite pasar largos perodos de tiempo sin comer.  Consuma alimentos ricos en fibra, como frutas frescas, verduras, frijoles y cereales integrales. Consulte a su nutricionista sobre cuntas porciones de carbohidratos puede consumir en cada comida.  Consuma entre 4 y 6 onzas (oz) de protenas magras por da, como carnes magras, pollo, pescado, huevos o tofu. Una onza de protena magra equivale a: ? 1 onza de carne, pollo o pescado. ? 1huevo. ?  taza de tofu.  Coma algunos alimentos por da que contengan grasas saludables, como aguacates, frutos secos, semillas y pescado. Estilo de vida  Controle su nivel de glucemia con regularidad.  Haga actividad fsica habitualmente como se lo haya indicado el mdico. Esto puede incluir lo siguiente: ? 150minutos semanales de ejercicio de intensidad moderada o alta. Esto podra incluir caminatas dinmicas, ciclismo o gimnasia acutica. ? Realizar ejercicios de elongacin y de fortalecimiento, como yoga o levantamiento   de pesas, por lo menos 2veces por semana.  Tome los medicamentos como se lo haya indicado el mdico.  No consuma ningn producto que contenga nicotina o tabaco, como cigarrillos y cigarrillos electrnicos. Si necesita ayuda para dejar de fumar, consulte al mdico.  Trabaje con un asesor o instructor en diabetes para identificar estrategias para controlar el estrs y cualquier desafo emocional  y social. Preguntas para hacerle al mdico  Es necesario que consulte a un instructor en el cuidado de la diabetes?  Es necesario que me rena con un nutricionista?  A qu nmero puedo llamar si tengo preguntas?  Cules son los mejores momentos para controlar la glucemia? Dnde encontrar ms informacin:  Asociacin Estadounidense de la Diabetes (American Diabetes Association): diabetes.org  Academia de Nutricin y Diettica (Academy of Nutrition and Dietetics): www.eatright.org  Instituto Nacional de la Diabetes y las Enfermedades Digestivas y Renales (National Institute of Diabetes and Digestive and Kidney Diseases, NIH): www.niddk.nih.gov Resumen  Un plan de alimentacin saludable lo ayudar a controlar la glucemia y mantener un estilo de vida saludable.  Trabajar con un especialista en dietas y nutricin (nutricionista) puede ayudarlo a elaborar el mejor plan de alimentacin para usted.  Tenga en cuenta que los carbohidratos (hidratos de carbono) y el alcohol tienen efectos inmediatos en sus niveles de glucemia. Es importante contar los carbohidratos que ingiere y consumir alcohol con prudencia. Esta informacin no tiene como fin reemplazar el consejo del mdico. Asegrese de hacerle al mdico cualquier pregunta que tenga. Document Released: 05/29/2007 Document Revised: 10/30/2016 Document Reviewed: 06/11/2016 Elsevier Interactive Patient Education  2019 [employer]  

## 2018-05-10 LAB — CBC WITH DIFFERENTIAL/PLATELET
Basophils Absolute: 0.1 10*3/uL (ref 0.0–0.2)
Basos: 1 %
EOS (ABSOLUTE): 0.3 10*3/uL (ref 0.0–0.4)
Eos: 3 %
Hematocrit: 48.5 % (ref 37.5–51.0)
Hemoglobin: 16.8 g/dL (ref 13.0–17.7)
Immature Grans (Abs): 0 10*3/uL (ref 0.0–0.1)
Immature Granulocytes: 0 %
Lymphocytes Absolute: 3.4 10*3/uL — ABNORMAL HIGH (ref 0.7–3.1)
Lymphs: 33 %
MCH: 29.3 pg (ref 26.6–33.0)
MCHC: 34.6 g/dL (ref 31.5–35.7)
MCV: 85 fL (ref 79–97)
MONOS ABS: 0.8 10*3/uL (ref 0.1–0.9)
Monocytes: 8 %
NEUTROS ABS: 5.7 10*3/uL (ref 1.4–7.0)
NEUTROS PCT: 55 %
Platelets: 248 10*3/uL (ref 150–450)
RBC: 5.73 x10E6/uL (ref 4.14–5.80)
RDW: 12.9 % (ref 11.6–15.4)
WBC: 10.2 10*3/uL (ref 3.4–10.8)

## 2018-05-10 LAB — COMPREHENSIVE METABOLIC PANEL
ALT: 15 IU/L (ref 0–44)
AST: 13 IU/L (ref 0–40)
Albumin/Globulin Ratio: 1.4 (ref 1.2–2.2)
Albumin: 3.9 g/dL (ref 3.8–4.9)
Alkaline Phosphatase: 136 IU/L — ABNORMAL HIGH (ref 39–117)
BUN/Creatinine Ratio: 13 (ref 9–20)
BUN: 10 mg/dL (ref 6–24)
Bilirubin Total: 0.4 mg/dL (ref 0.0–1.2)
CHLORIDE: 108 mmol/L — AB (ref 96–106)
CO2: 17 mmol/L — ABNORMAL LOW (ref 20–29)
Calcium: 9.5 mg/dL (ref 8.7–10.2)
Creatinine, Ser: 0.77 mg/dL (ref 0.76–1.27)
GFR calc Af Amer: 116 mL/min/{1.73_m2} (ref >59)
GFR calc non Af Amer: 101 mL/min/{1.73_m2} (ref >59)
GLOBULIN, TOTAL: 2.8 g/dL (ref 1.5–4.5)
Glucose: 188 mg/dL — ABNORMAL HIGH (ref 65–99)
Potassium: 4.1 mmol/L (ref 3.5–5.2)
SODIUM: 138 mmol/L (ref 134–144)
Total Protein: 6.7 g/dL (ref 6.0–8.5)

## 2018-05-10 LAB — LIPID PANEL
Chol/HDL Ratio: 4.4 ratio (ref 0.0–5.0)
Cholesterol, Total: 127 mg/dL (ref 100–199)
HDL: 29 mg/dL — ABNORMAL LOW (ref >39)
LDL Calculated: 40 mg/dL (ref 0–99)
Triglycerides: 291 mg/dL — ABNORMAL HIGH (ref 0–149)
VLDL Cholesterol Cal: 58 mg/dL — ABNORMAL HIGH (ref 5–40)

## 2018-05-30 IMAGING — US US RENAL
1 series · 14 of 25 positions shown · non-contrast
Comparison: CT 10/23/2013

CLINICAL DATA: Suprapubic pain for 1 week

EXAM:
RENAL / URINARY TRACT ULTRASOUND COMPLETE

[Series 1: us renal · 0.23mm/px · 14 of 31 slices shown]
[im 1/31]
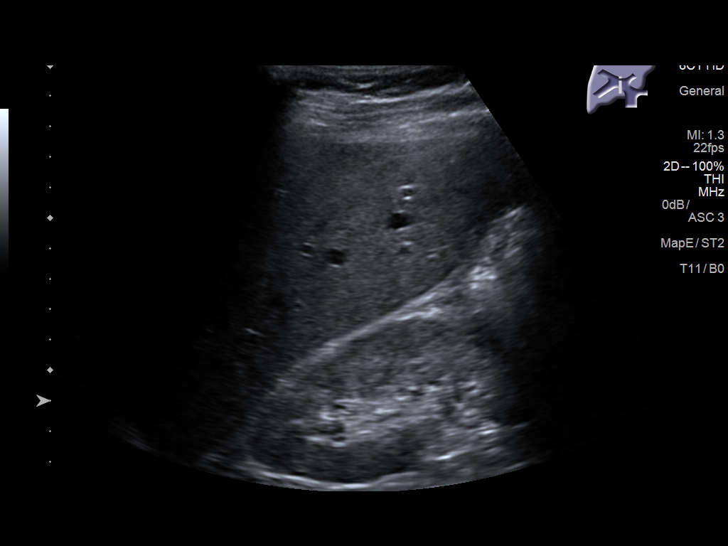
[im 3/31]
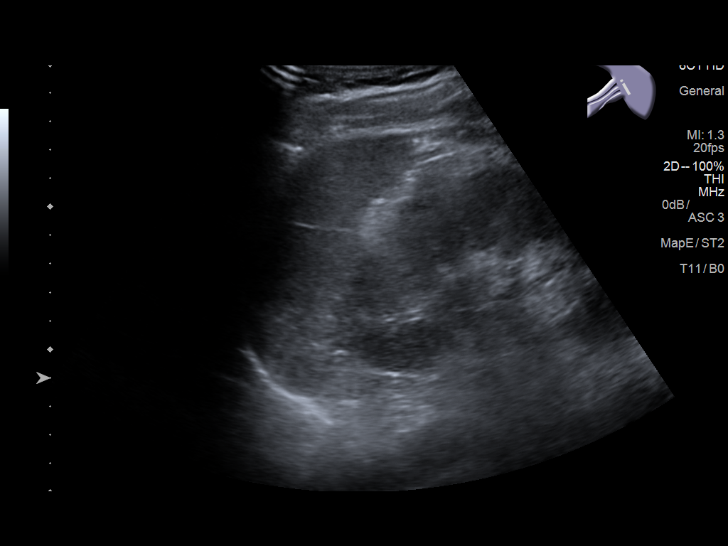
[im 6/31]
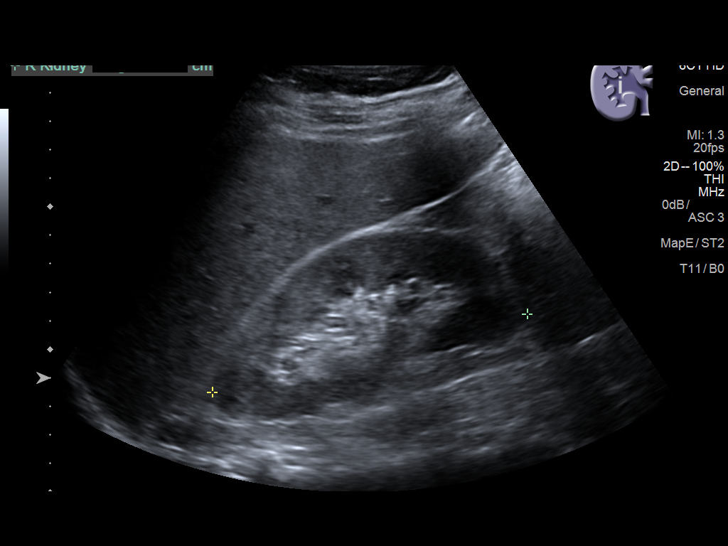
[im 8/31]
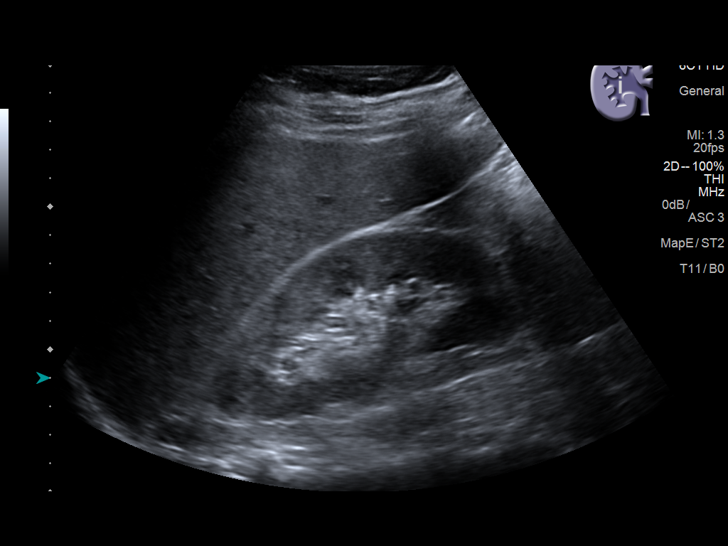
[im 11/31]
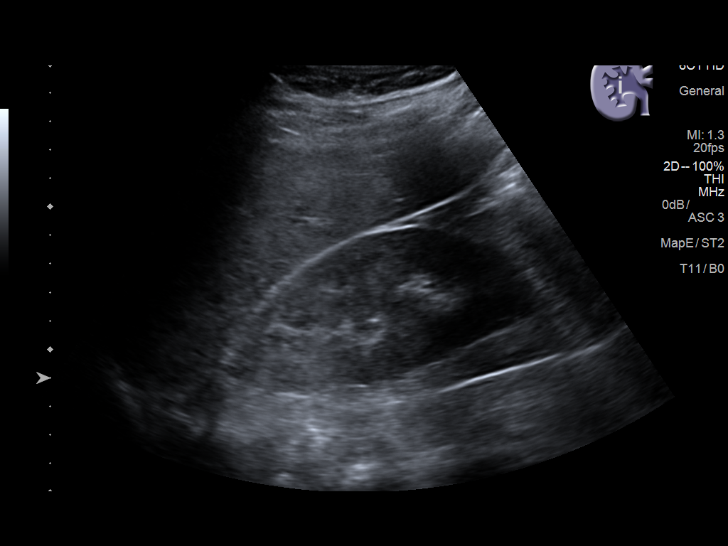
[im 12/31]
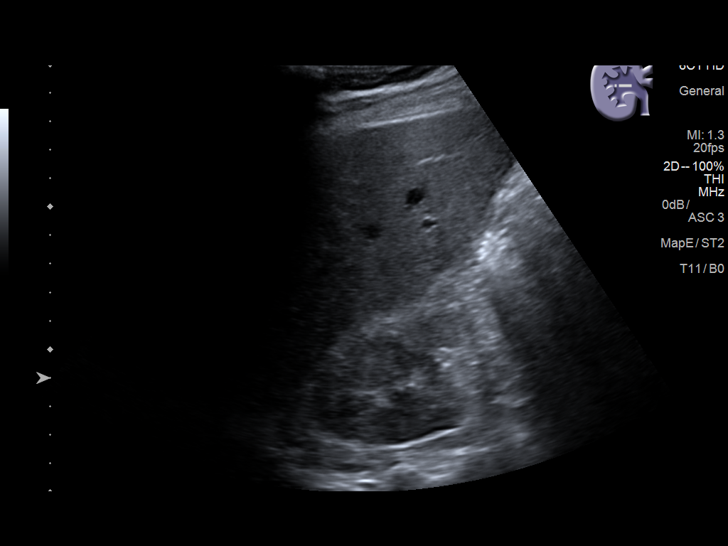
[im 14/31]
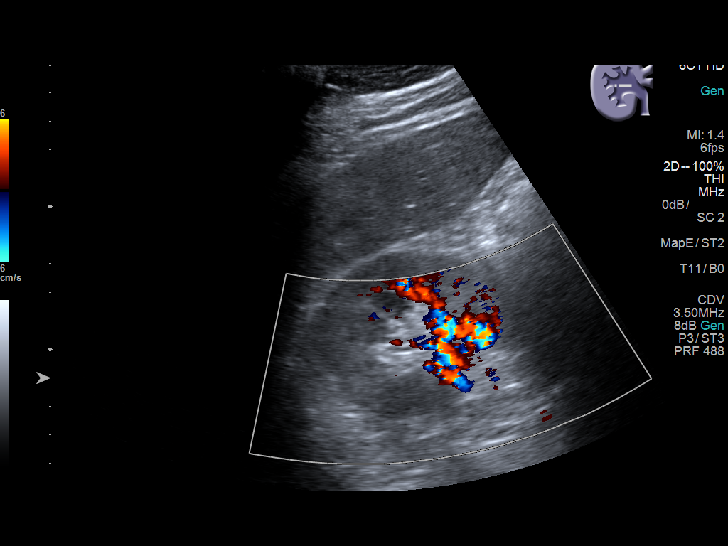
[im 17/31]
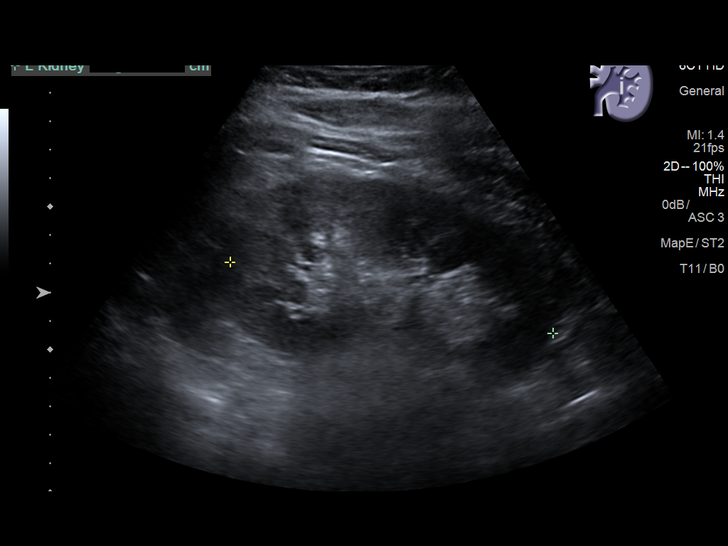
[im 19/31]
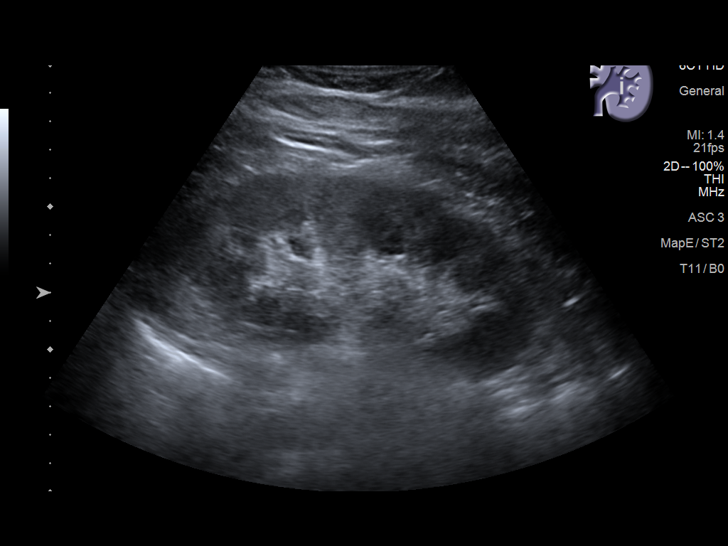
[im 21/31]
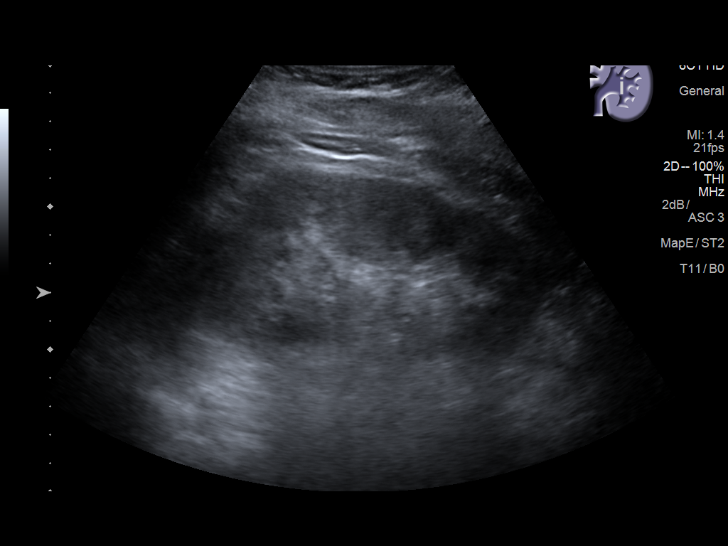
[im 23/31]
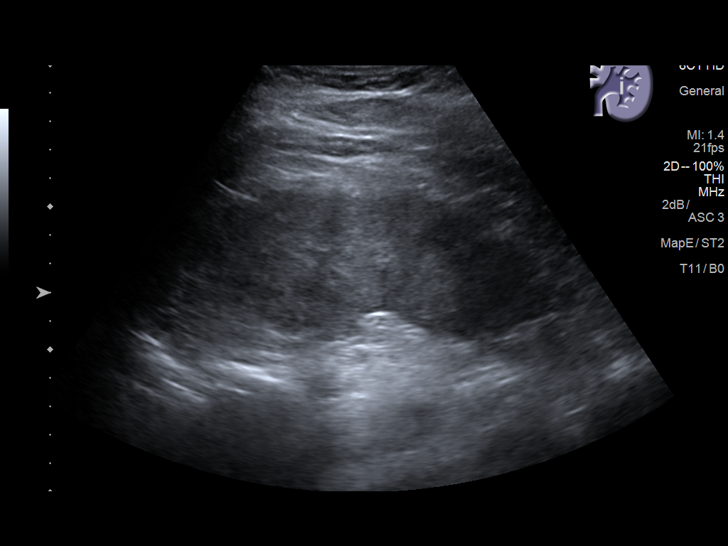
[im 26/31]
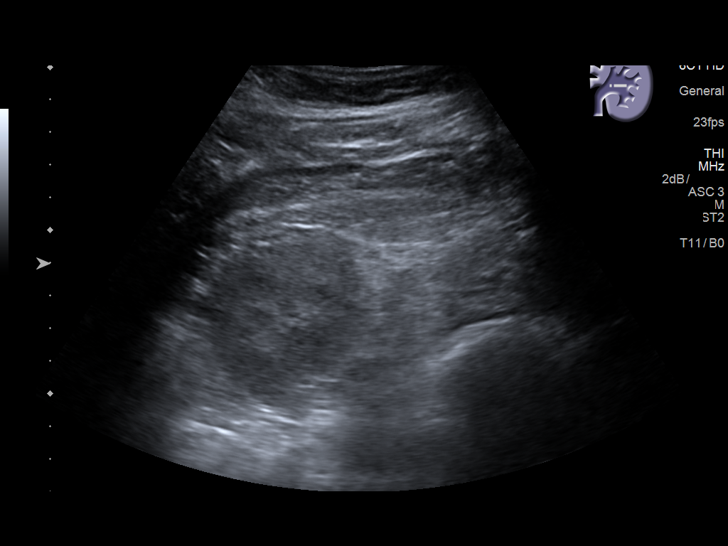
[im 28/31]
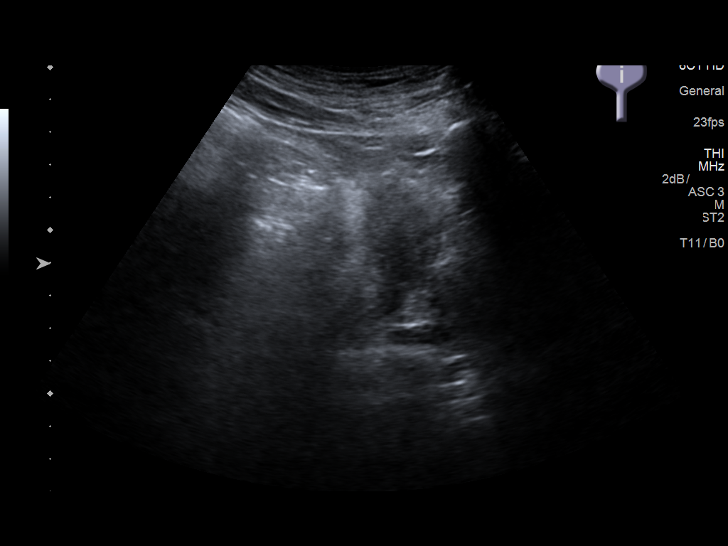
[im 31/31]
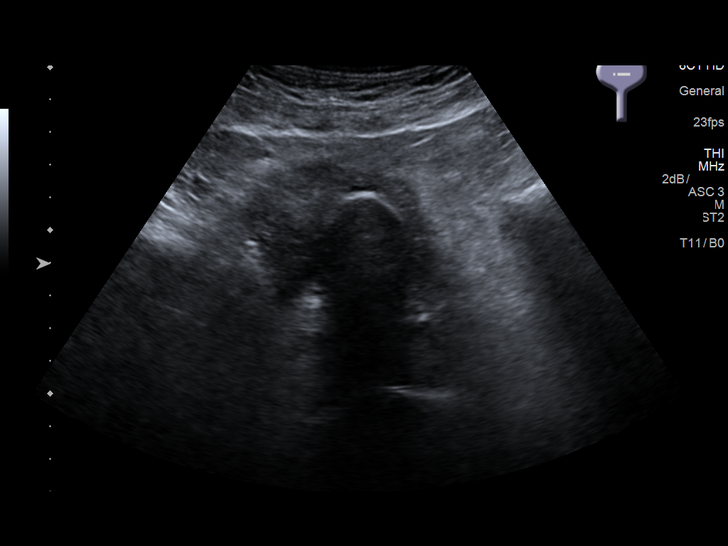

[14 of 25 positions shown; findings below may reference images not displayed]

FINDINGS: Right Kidney:

Length: 11.4 cm. Echogenicity within normal limits. No mass or
hydronephrosis visualized.

Left Kidney:

Length: 11.6 cm. Echogenicity within normal limits. No mass or
hydronephrosis visualized.

Bladder:

Foley catheter is present.  The bladder is empty.
IMPRESSION: Negative renal ultrasound

## 2018-08-06 ENCOUNTER — Ambulatory Visit: Payer: 59 | Admitting: Emergency Medicine

## 2018-08-27 ENCOUNTER — Encounter: Payer: Self-pay | Admitting: Emergency Medicine

## 2018-08-27 ENCOUNTER — Other Ambulatory Visit: Payer: Self-pay

## 2018-08-27 ENCOUNTER — Ambulatory Visit: Payer: 59 | Admitting: Emergency Medicine

## 2018-08-27 VITALS — BP 115/76 | HR 103 | Temp 98.8°F | Resp 16 | Ht 64.0 in | Wt 159.8 lb

## 2018-08-27 DIAGNOSIS — Z794 Long term (current) use of insulin: Secondary | ICD-10-CM

## 2018-08-27 DIAGNOSIS — E1165 Type 2 diabetes mellitus with hyperglycemia: Secondary | ICD-10-CM

## 2018-08-27 DIAGNOSIS — E1159 Type 2 diabetes mellitus with other circulatory complications: Secondary | ICD-10-CM | POA: Diagnosis not present

## 2018-08-27 DIAGNOSIS — E1169 Type 2 diabetes mellitus with other specified complication: Secondary | ICD-10-CM

## 2018-08-27 DIAGNOSIS — E782 Mixed hyperlipidemia: Secondary | ICD-10-CM

## 2018-08-27 DIAGNOSIS — E785 Hyperlipidemia, unspecified: Secondary | ICD-10-CM

## 2018-08-27 DIAGNOSIS — B029 Zoster without complications: Secondary | ICD-10-CM | POA: Diagnosis not present

## 2018-08-27 DIAGNOSIS — I10 Essential (primary) hypertension: Secondary | ICD-10-CM

## 2018-08-27 DIAGNOSIS — I152 Hypertension secondary to endocrine disorders: Secondary | ICD-10-CM

## 2018-08-27 LAB — POCT GLYCOSYLATED HEMOGLOBIN (HGB A1C): Hemoglobin A1C: 9.9 % — AB (ref 4.0–5.6)

## 2018-08-27 LAB — GLUCOSE, POCT (MANUAL RESULT ENTRY): POC Glucose: 146 mg/dl — AB (ref 70–99)

## 2018-08-27 MED ORDER — LANTUS SOLOSTAR 100 UNIT/ML ~~LOC~~ SOPN: 45.0000 [IU] | PEN_INJECTOR | Freq: Every day | SUBCUTANEOUS | 11 refills | Status: DC

## 2018-08-27 MED ORDER — ATORVASTATIN CALCIUM 40 MG PO TABS: 40.0000 mg | ORAL_TABLET | Freq: Every day | ORAL | 1 refills | Status: DC

## 2018-08-27 NOTE — Progress Notes (Addendum)
Joseph Roberts 58 y.o.   Chief Complaint  Patient presents with  . Diabetes    follow up  . Medication Refill    atorvastatin    HISTORY OF PRESENT ILLNESS: This is a 58 y.o. male with history of diabetes here for follow-up.  Needs medication refills.  Patient's medical insurance, [place] [employer], will not cover Levemir insulin, instead they want me to prescribe either Lantus or Toujeo.  Patient feels well.  Continues to work.  Physically active.  Compliant with medications.  Has no complaints or medical concerns today.  HPI   Prior to Admission medications   Medication Sig Start Date End Date Taking? Authorizing Provider  atorvastatin (LIPITOR) 40 MG tablet Take 1 tablet (40 mg total) by mouth daily. 08/27/18  Yes Jaymian Bogart, Ines Bloomer, MD  insulin regular (HUMULIN R) 100 units/mL injection Inject 0.1 mLs (10 Units total) into the skin 3 (three) times daily before meals. 11/01/17  Yes Kanye Depree, Ines Bloomer, MD  lisinopril (PRINIVIL,ZESTRIL) 10 MG tablet Take 1 tablet (10 mg total) by mouth daily. 05/09/18  Yes Horald Pollen, MD  metFORMIN (GLUCOPHAGE) 1000 MG tablet Take 1 tablet (1,000 mg total) by mouth 2 (two) times daily with a meal. 05/09/18  Yes Orlandis Sanden, Ines Bloomer, MD  Blood Glucose Monitoring Suppl (ONE TOUCH ULTRA MINI) W/DEVICE KIT 1 each by Does not apply route daily. Test fasting daily    [provider]  CETIRIZINE HCL PO Take by mouth daily.    [provider]  glipiZIDE (GLUCOTROL) 5 MG tablet Take 2 tablets (10 mg total) by mouth 2 (two) times daily before a meal. 05/09/18 08/07/18  Ruchel Brandenburger, Ines Bloomer, MD  Insulin Glargine (LANTUS SOLOSTAR) 100 UNIT/ML Solostar Pen Inject 45 Units into the skin daily. Same time every day. 08/27/18   Horald Pollen, MD  Insulin Syringe-Needle U-100 (INSULIN SYRINGE .5CC/31GX5/16") 31G X 5/16" 0.5 ML MISC ADMINISTER INSULIN AS INSTRUCTED 04/18/18   Horald Pollen, MD    No Known Allergies  Patient  Active Problem List   Diagnosis Date Noted  . Uncontrolled type 2 diabetes mellitus with hyperglycemia ([place]) 07/27/2017  . Essential hypertension 03/06/2016  . Uncontrolled type 2 diabetes mellitus with complication ([place])   . Dyslipidemia   . Type 2 diabetes mellitus with hyperglycemia ([place]) 03/05/2016  . 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 Dyslipidemia with low high density lipoprotein (HDL) cholesterol with hypertriglyceridemia due to type 2 diabetes mellitus ([place]) 11/03/2013  . Gall bladder polyp 10/24/2013  . GERD (gastroesophageal reflux disease) 10/24/2013  . Uncontrolled type 2 diabetes mellitus without complication, with long-term current use of insulin ([place]) 10/23/2013    Past Medical History:  Diagnosis Date  . Diabetes mellitus 2011  . High triglycerides     Past Surgical History:  Procedure Laterality Date  . COLONOSCOPY N/A 10/24/2013   Procedure: COLONOSCOPY;  Surgeon: Ladene Artist, MD;  Location: [facility] ENDOSCOPY;  Service: Endoscopy;  Laterality: N/A;. diverticulosis, mild, small internal hemorrhoids    Social History   Socioeconomic History  . Marital status: Married    Spouse name: Not on file  . Number of children: Not on file  . Years of education: Not on file  . Highest education level: Not on file  Occupational History  . Not on file  Social Needs  . Financial resource strain: Not on file  . Food insecurity    Worry: Not on file    Inability: Not on file  . Transportation needs  Medical: Not on file    Non-medical: Not on file  Tobacco Use  . Smoking status: Current Every Day Smoker    Packs/day: 0.50    Years: 30.00    Pack years: 15.00    Types: Cigarettes  . Smokeless tobacco: Never Used  Substance and Sexual Activity  . Alcohol use: No  . Drug use: No  . Sexual activity: Not on file  Lifestyle  . Physical activity    Days per week: Not on file    Minutes per session: Not on file  . Stress: Not on file  Relationships  . Social  Herbalist on phone: Not on file    Gets together: Not on file    Attends religious service: Not on file    Active member of club or organization: Not on file    Attends meetings of clubs or organizations: Not on file    Relationship status: Not on file  . Intimate partner violence    Fear of current or ex partner: Not on file    Emotionally abused: Not on file    Physically abused: Not on file    Forced sexual activity: Not on file  Other Topics Concern  . Not on file  Social History Narrative  . Not on file    Family History  Problem Relation Age of Onset  . Diabetes Mother   . Diabetes Sister   . Diabetes Brother      Review of Systems  Constitutional: Negative.  Negative for chills and fever.  HENT: Negative.  Negative for congestion, nosebleeds and sore throat.   Eyes: Negative.  Negative for blurred vision and double vision.  Respiratory: Negative.  Negative for cough.   Cardiovascular: Negative.  Negative for chest pain and palpitations.  Gastrointestinal: Negative.  Negative for abdominal pain, diarrhea, nausea and vomiting.  Genitourinary: Negative.   Musculoskeletal: Negative.  Negative for myalgias and neck pain.  Skin: Positive for rash (Painful rash to left flank area that started 3 weeks ago).  Neurological: Negative.  Negative for dizziness and headaches.  Endo/Heme/Allergies: Negative.      Physical Exam Vitals signs reviewed.  Constitutional:      Appearance: Normal appearance.  HENT:     Head: Normocephalic and atraumatic.     Mouth/Throat:     Mouth: Mucous membranes are moist.     Pharynx: Oropharynx is clear.  Eyes:     Extraocular Movements: Extraocular movements intact.     Conjunctiva/sclera: Conjunctivae normal.     Pupils: Pupils are equal, round, and reactive to light.  Neck:     Musculoskeletal: Normal range of motion and neck supple.  Cardiovascular:     Rate and Rhythm: Normal rate and regular rhythm.     Pulses: Normal  pulses.     Heart sounds: Normal heart sounds.  Pulmonary:     Effort: Pulmonary effort is normal.     Breath sounds: Normal breath sounds.  Abdominal:     General: There is no distension.     Palpations: Abdomen is soft.     Tenderness: There is no abdominal tenderness.  Musculoskeletal: Normal range of motion.  Skin:    General: Skin is warm and dry.     Findings: Rash present.  Neurological:     General: No focal deficit present.     Mental Status: He is alert and oriented to person, place, and time.  Psychiatric:  Mood and Affect: Mood normal.        Behavior: Behavior normal.        Results for orders placed or performed in visit on 08/27/18 (from the past 24 hour(s))  POCT glycosylated hemoglobin (Hb A1C)     Status: Abnormal   Collection Time: 08/27/18  3:34 PM  Result Value Ref Range   Hemoglobin A1C 9.9 (A) 4.0 - 5.6 %   HbA1c POC (<> result, manual entry)     HbA1c, POC (prediabetic range)     HbA1c, POC (controlled diabetic range)    POCT glucose (manual entry)     Status: Abnormal   Collection Time: 08/27/18  3:34 PM  Result Value Ref Range   POC Glucose 146 (A) 70 - 99 mg/dl     ASSESSMENT & PLAN: Ryver was seen today for diabetes and medication refill.  Diagnoses and all orders for this visit:  Type 2 diabetes mellitus with hyperglycemia, with long-term current use of insulin (HCC) -     POCT glycosylated hemoglobin (Hb A1C) -     POCT glucose (manual entry) -     CBC with Differential/Platelet -     Comprehensive metabolic panel -     Lipid panel -     Insulin Glargine (LANTUS SOLOSTAR) 100 UNIT/ML Solostar Pen; Inject 45 Units into the skin daily. Same time every day.  Hypertension associated with type 2 diabetes mellitus ([place])  Dyslipidemia associated with type 2 diabetes mellitus ([place])  Dyslipidemia with low high density lipoprotein (HDL) cholesterol with hypertriglyceridemia due to type 2 diabetes mellitus (HCC) -     atorvastatin  (LIPITOR) 40 MG tablet; Take 1 tablet (40 mg total) by mouth daily.  Uncontrolled type 2 diabetes mellitus with hyperglycemia (HCC)  Herpes zoster without complication   Uncontrolled type 2 diabetes mellitus with hyperglycemia ([place]) Uncontrolled diabetes with hemoglobin A1c at 9.8, same as before.  We will continue glipizide and metformin.  Increase basal insulin to 45 units daily, changed to Lantus, advised to take it daily at the same time.  Diabetic nutrition covered with patient.  Follow-up in 3 months.  Atorvastatin 40 mg daily refilled.     Patient Instructions       If you have lab work done today you will be contacted with your lab results within the next 2 weeks.  If you have not heard from [place] then please contact us. The fastest way to get your results is to register for My Chart.   IF you received an x-ray today, you will receive an invoice from [facility]. Please contact [facility] at [phone] with questions or concerns regarding your invoice.   IF you received labwork today, you will receive an invoice from [facility]. Please contact [facility] at [phone] with questions or concerns regarding your invoice.   Our billing staff will not be able to assist you with questions regarding bills from these companies.  You will be contacted with the lab results as soon as they are available. The fastest way to get your results is to activate your My Chart account. Instructions are located on the last page of this paperwork. If you have not heard from [place] regarding the results in 2 weeks, please contact this office.     Diabetes mellitus y nutricin, en adultos Diabetes Mellitus and Nutrition, Adult Si sufre de diabetes (diabetes mellitus), es muy importante tener hbitos alimenticios saludables debido a que sus niveles de Designer, television/film set sangre (glucosa) se ven afectados  en gran medida por lo que come y bebe. [place] alimentos saludables en las cantidades  [place], aproximadamente a la [employer], [place] ayudar a:  Aeronautical engineer glucemia.  Disminuir el riesgo de sufrir una enfermedad cardaca.  Mejorar la presin arterial.  Science writer o mantener un peso saludable. Todas las personas que sufren de diabetes son diferentes y cada una tiene necesidades diferentes en cuanto a un plan de alimentacin. El mdico puede recomendarle que trabaje con un especialista en dietas y nutricin (nutricionista) para Financial trader plan para usted. Su plan de alimentacin puede variar segn factores como:  Las caloras que necesita.  Los medicamentos que toma.  Su peso.  Sus niveles de glucemia, presin arterial y colesterol.  Su nivel de [place].  Otras afecciones que tenga, como enfermedades cardacas o renales. Cmo me afectan los carbohidratos? Los carbohidratos, o hidratos de carbono, afectan su nivel de glucemia ms que cualquier otro tipo de alimento. La ingesta de carbohidratos naturalmente aumenta la cantidad de [employer]. El recuento de carbohidratos es un mtodo destinado a Catering manager un registro de la cantidad de carbohidratos que se consumen. El recuento de carbohidratos es importante para Theatre manager la glucemia a un nivel saludable, especialmente si utiliza insulina o toma determinados medicamentos por va oral para la diabetes. Es importante conocer la cantidad de carbohidratos que se pueden ingerir en cada comida sin correr Engineer, manufacturing. Esto es Psychologist, forensic. Su nutricionista puede ayudarlo a calcular la cantidad de carbohidratos que debe ingerir en cada comida y en cada refrigerio. Entre los alimentos que contienen carbohidratos, se incluyen:  Pan, cereal, arroz, pastas y galletas.  Papas y maz.  Guisantes, frijoles y lentejas.  Leche y Estate agent.  Lambert Mody y [place].  Postres, como pasteles, galletas, helado y caramelos. Cmo me afecta el alcohol? El alcohol puede provocar disminuciones sbitas de la  glucemia (hipoglucemia), especialmente si utiliza insulina o toma determinados medicamentos por va oral para la diabetes. La hipoglucemia es una afeccin potencialmente mortal. Los sntomas de la hipoglucemia (somnolencia, mareos y confusin) son similares a los sntomas de haber consumido demasiado alcohol. Si el mdico afirma que el alcohol es seguro para usted, [place] estas pautas:  Limite el consumo de alcohol a no ms de 20mdida por da si es mujer y no est eJuliette y a 271midas si es hombre. Una medida equivale a 12oz (35531mde cerveza, 5oz (148m20me vino o 1oz (44ml11m bebidas alcohlicas de alta graduacin.  No beba con el estmago vaco.  Mantngase hidratado bebiendo agua, refrescos dietticos o t helado sin azcar.  Tenga en cuenta que los refrescos comunes, los jugos y otras bebida para mezclOptician, dispensingen contener mucha azcar y se deben contar como carbohidratos. Cules son algunos consejos para seguir este plan?  Leer las etiquetas de los alimentos  Comience por leer el tamao de la porcin en la "Informacin nutricional" en las etiquetas de los alimentos envasados y las bebidas. La cantidad de caloras, carbohidratos, grasas y otros nutrientes mencionados en la etiqueta se basan en una porcin del alimento. Muchos alimentos contienen ms de una porcin por envase.  Verifique la cantidad total de gramos (g) de carbohidratos totales en una porcin. Puede calcular la cantidad de porciones de carbohidratos al dividir el total de carbohidratos por 15. Por ejemplo, si un alimento tiene un total de 30g de carbohidratos, equivale a 2 porciones de carbohidratos.  Verifique la cantidad de gramos (g) de grasas saturadas y grasas trans en  una porcin. Escoja alimentos que no contengan grasa o que tengan un bajo contenido.  Verifique la cantidad de miligramos (mg) de sal (sodio) en una porcin. [place] [employer] de las personas deben limitar la ingesta de sodio total a menos de 2333m  por dTraining and development officer  Siempre consulte la informacin nutricional de los alimentos etiquetados como "con bajo contenido de grasa" o "sin grasa". Estos alimentos pueden tener un mayor contenido de aLocation manageragregada o carbohidratos refinados, y deben evitarse.  Hable con su nutricionista para identificar sus objetivos diarios en cuanto a los nutrientes mencionados en la etiqueta. Al ir de compras  Evite comprar alimentos procesados, enlatados o precocinados. Estos alimentos tienden a tSpecial educational needs teachermayor cantidad de gSaratoga Springs sodio y azcar agregada.  Compre en la zona exterior de la tienda de comestibles. Esta zona incluye frutas y verduras frescas, granos a granel, carnes frescas y productos lcteos frescos. Al cocinar  Utilice mtodos de coccin a baja temperatura, como hornear, en lugar de mtodos de coccin a alta temperatura, como frer en abundante aceite.  Cocine con aceites saludables, como el aceite de oBayamon canola o gBow [place]  Evite cocinar con manteca, crema o carnes con alto contenido de grasa. Planificacin de las comidas  Coma las comidas y los refrigerios regularmente, preferentemente a la misma hora todos lCassandra Evite pasar largos perodos de tiempo sin comer.  Consuma alimentos ricos en fibra, como frutas frescas, verduras, frijoles y cereales integrales. Consulte a su nutricionista sobre cuntas porciones de carbohidratos puede consumir en cada comida.  Consuma entre 4 y 6 onzas (oz) de protenas magras por da, como carnes mRiverside pollo, pescado, huevos o tofu. Una onza de protena magra equivale a: ? 1 onza de carne, pollo o pescado. ? 1huevo. ?  taza de tofu.  Coma algunos alimentos por da que contengan grasas saludables, como aguacates, frutos secos, semillas y pescado. Estilo de vida  Controle su nivel de glucemia con regularidad.  Haga actividad fsica habitualmente como se lo haya indicado el mdico. Esto puede incluir lo siguiente: ? 1551mutos semanales de ejercicio de  intensidad moderada o alta. Esto podra incluir caminatas dinmicas, ciclismo o gimnasia acutica. ? Realizar ejercicios de elongacin y de fortalecimiento, como yoga o levantamiento de pesas, por lo menos 2veces por semana.  Tome los meTenneco Ince lo haya indicado el mdico.  No consuma ningn producto que contenga nicotina o tabaco, como cigarrillos y ciPsychologist, sport and exerciseSi necesita ayuda para dejar de fumar, consulte al mdHess Corporationon un asesor o instructor en diabetes para identificar estrategias para controlar el estrs y cualquier desafo emocional y social. Preguntas para hacerle al mdico  Es necesario que consulte a unRadio broadcast assistantn el cuidado de la diabetes?  Es necesario que me rena con un nutricionista?  A qu nmero puedo llamar si tengo preguntas?  Cules son los mejores momentos para controlar la glucemia? Dnde encontrar ms informacin:  Asociacin Estadounidense de la Diabetes (American Diabetes Association): diabetes.org  Academia de Nutricin y DiInformation systems managerAcademy of Nutrition and Dietetics): www.eatright.orWelchiabetes y las Enfermedades Digestivas y Renales (NVirginia Center For Eye Surgeryf Diabetes and Digestive and Kidney Diseases, NIH): wwDesMoinesFuneral.dkesumen  Un plan de alimentacin saludable lo ayudar a coAeronautical engineerlucemia y maTheatre managern estilo de vida saludable.  Trabajar con un especialista en dietas y nutricin (nutricionista) puede ayudarlo a elInsurance claims handlere alimentacin para usted.  Tenga en cuenta que los carbohidratos (hidratos de carbono) y el alcohol tienen efectos inmediatos  en sus niveles de glucemia. Es importante contar los carbohidratos que ingiere y consumir alcohol con prudencia. Esta informacin no tiene Marine scientist el consejo del mdico. Asegrese de hacerle al mdico cualquier pregunta que tenga. Document Released: 05/29/2007 Document Revised: 10/30/2016 Document Reviewed:  06/11/2016 Elsevier Interactive Patient Education  2019 Elsevier Inc.      Agustina Caroli, MD [facility]

## 2018-08-27 NOTE — Assessment & Plan Note (Addendum)
Uncontrolled diabetes with hemoglobin A1c at 9.8, same as before.  We will continue glipizide and metformin.  Increase basal insulin to 45 units daily, changed to Lantus, advised to take it daily at the same time.  Diabetic nutrition covered with patient.  Follow-up in 3 months.  Atorvastatin 40 mg daily refilled.

## 2018-08-27 NOTE — Patient Instructions (Addendum)
   If you have lab work done today you will be contacted with your lab results within the next 2 weeks.  If you have not heard from us then please contact us. The fastest way to get your results is to register for My Chart.   IF you received an x-ray today, you will receive an invoice from [facility]. Please contact [facility] at [phone] with questions or concerns regarding your invoice.   IF you received labwork today, you will receive an invoice from [facility]. Please contact [facility] at [phone] with questions or concerns regarding your invoice.   Our billing staff will not be able to assist you with questions regarding bills from these companies.  You will be contacted with the lab results as soon as they are available. The fastest way to get your results is to activate your My Chart account. Instructions are located on the last page of this paperwork. If you have not heard from us regarding the results in 2 weeks, please contact this office.     Diabetes mellitus y nutricin, en adultos Diabetes Mellitus and Nutrition, Adult Si sufre de diabetes (diabetes mellitus), es muy importante tener hbitos alimenticios saludables debido a que sus niveles de azcar en la sangre (glucosa) se ven afectados en gran medida por lo que come y bebe. [place] alimentos saludables en las cantidades adecuadas, aproximadamente a la misma hora todos los das, lo ayudar a:  Controlar la glucemia.  Disminuir el riesgo de sufrir una enfermedad cardaca.  Mejorar la presin arterial.  Alcanzar o mantener un peso saludable. Todas las personas que sufren de diabetes son diferentes y cada una tiene necesidades diferentes en cuanto a un plan de alimentacin. El mdico puede recomendarle que trabaje con un especialista en dietas y nutricin (nutricionista) para elaborar el mejor plan para usted. Su plan de alimentacin puede variar segn factores como:  Las caloras que  necesita.  Los medicamentos que toma.  Su peso.  Sus niveles de glucemia, presin arterial y colesterol.  Su nivel de actividad.  Otras afecciones que tenga, como enfermedades cardacas o renales. Cmo me afectan los carbohidratos? Los carbohidratos, o hidratos de carbono, afectan su nivel de glucemia ms que cualquier otro tipo de alimento. La ingesta de carbohidratos naturalmente aumenta la cantidad de glucosa en la sangre. El recuento de carbohidratos es un mtodo destinado a llevar un registro de la cantidad de carbohidratos que se consumen. El recuento de carbohidratos es importante para mantener la glucemia a un nivel saludable, especialmente si utiliza insulina o toma determinados medicamentos por va oral para la diabetes. Es importante conocer la cantidad de carbohidratos que se pueden ingerir en cada comida sin correr ningn riesgo. Esto es diferente en cada persona. Su nutricionista puede ayudarlo a calcular la cantidad de carbohidratos que debe ingerir en cada comida y en cada refrigerio. Entre los alimentos que contienen carbohidratos, se incluyen:  Pan, cereal, arroz, pastas y galletas.  Papas y maz.  Guisantes, frijoles y lentejas.  Leche y yogur.  Frutas y jugo.  Postres, como pasteles, galletas, helado y caramelos. Cmo me afecta el alcohol? El alcohol puede provocar disminuciones sbitas de la glucemia (hipoglucemia), especialmente si utiliza insulina o toma determinados medicamentos por va oral para la diabetes. La hipoglucemia es una afeccin potencialmente mortal. Los sntomas de la hipoglucemia (somnolencia, mareos y confusin) son similares a los sntomas de haber consumido demasiado alcohol. Si el mdico afirma que el alcohol es seguro para usted, siga estas pautas:    Limite el consumo de alcohol a no ms de 1medida por da si es mujer y no est embarazada, y a 2medidas si es hombre. Una medida equivale a 12oz (355ml) de cerveza, 5oz (148ml) de vino o  1oz (44ml) de bebidas alcohlicas de alta graduacin.  No beba con el estmago vaco.  Mantngase hidratado bebiendo agua, refrescos dietticos o t helado sin azcar.  Tenga en cuenta que los refrescos comunes, los jugos y otras bebida para mezclar pueden contener mucha azcar y se deben contar como carbohidratos. Cules son algunos consejos para seguir este plan?  Leer las etiquetas de los alimentos  Comience por leer el tamao de la porcin en la "Informacin nutricional" en las etiquetas de los alimentos envasados y las bebidas. La cantidad de caloras, carbohidratos, grasas y otros nutrientes mencionados en la etiqueta se basan en una porcin del alimento. Muchos alimentos contienen ms de una porcin por envase.  Verifique la cantidad total de gramos (g) de carbohidratos totales en una porcin. Puede calcular la cantidad de porciones de carbohidratos al dividir el total de carbohidratos por 15. Por ejemplo, si un alimento tiene un total de 30g de carbohidratos, equivale a 2 porciones de carbohidratos.  Verifique la cantidad de gramos (g) de grasas saturadas y grasas trans en una porcin. Escoja alimentos que no contengan grasa o que tengan un bajo contenido.  Verifique la cantidad de miligramos (mg) de sal (sodio) en una porcin. La mayora de las personas deben limitar la ingesta de sodio total a menos de 2300mg por da.  Siempre consulte la informacin nutricional de los alimentos etiquetados como "con bajo contenido de grasa" o "sin grasa". Estos alimentos pueden tener un mayor contenido de azcar agregada o carbohidratos refinados, y deben evitarse.  Hable con su nutricionista para identificar sus objetivos diarios en cuanto a los nutrientes mencionados en la etiqueta. Al ir de compras  Evite comprar alimentos procesados, enlatados o precocinados. Estos alimentos tienden a tener una mayor cantidad de grasa, sodio y azcar agregada.  Compre en la zona exterior de la tienda de  comestibles. Esta zona incluye frutas y verduras frescas, granos a granel, carnes frescas y productos lcteos frescos. Al cocinar  Utilice mtodos de coccin a baja temperatura, como hornear, en lugar de mtodos de coccin a alta temperatura, como frer en abundante aceite.  Cocine con aceites saludables, como el aceite de oliva, canola o girasol.  Evite cocinar con manteca, crema o carnes con alto contenido de grasa. Planificacin de las comidas  Coma las comidas y los refrigerios regularmente, preferentemente a la misma hora todos los das. Evite pasar largos perodos de tiempo sin comer.  Consuma alimentos ricos en fibra, como frutas frescas, verduras, frijoles y cereales integrales. Consulte a su nutricionista sobre cuntas porciones de carbohidratos puede consumir en cada comida.  Consuma entre 4 y 6 onzas (oz) de protenas magras por da, como carnes magras, pollo, pescado, huevos o tofu. Una onza de protena magra equivale a: ? 1 onza de carne, pollo o pescado. ? 1huevo. ?  taza de tofu.  Coma algunos alimentos por da que contengan grasas saludables, como aguacates, frutos secos, semillas y pescado. Estilo de vida  Controle su nivel de glucemia con regularidad.  Haga actividad fsica habitualmente como se lo haya indicado el mdico. Esto puede incluir lo siguiente: ? 150minutos semanales de ejercicio de intensidad moderada o alta. Esto podra incluir caminatas dinmicas, ciclismo o gimnasia acutica. ? Realizar ejercicios de elongacin y de fortalecimiento, como yoga o levantamiento   de pesas, por lo menos 2veces por semana.  Tome los medicamentos como se lo haya indicado el mdico.  No consuma ningn producto que contenga nicotina o tabaco, como cigarrillos y cigarrillos electrnicos. Si necesita ayuda para dejar de fumar, consulte al mdico.  Trabaje con un asesor o instructor en diabetes para identificar estrategias para controlar el estrs y cualquier desafo emocional  y social. Preguntas para hacerle al mdico  Es necesario que consulte a un instructor en el cuidado de la diabetes?  Es necesario que me rena con un nutricionista?  A qu nmero puedo llamar si tengo preguntas?  Cules son los mejores momentos para controlar la glucemia? Dnde encontrar ms informacin:  Asociacin Estadounidense de la Diabetes (American Diabetes Association): diabetes.org  Academia de Nutricin y Diettica (Academy of Nutrition and Dietetics): www.eatright.org  Instituto Nacional de la Diabetes y las Enfermedades Digestivas y Renales (National Institute of Diabetes and Digestive and Kidney Diseases, NIH): www.niddk.nih.gov Resumen  Un plan de alimentacin saludable lo ayudar a controlar la glucemia y mantener un estilo de vida saludable.  Trabajar con un especialista en dietas y nutricin (nutricionista) puede ayudarlo a elaborar el mejor plan de alimentacin para usted.  Tenga en cuenta que los carbohidratos (hidratos de carbono) y el alcohol tienen efectos inmediatos en sus niveles de glucemia. Es importante contar los carbohidratos que ingiere y consumir alcohol con prudencia. Esta informacin no tiene como fin reemplazar el consejo del mdico. Asegrese de hacerle al mdico cualquier pregunta que tenga. Document Released: 05/29/2007 Document Revised: 10/30/2016 Document Reviewed: 06/11/2016 Elsevier Interactive Patient Education  2019 [employer]  

## 2018-08-28 LAB — LIPID PANEL
Chol/HDL Ratio: 5.8 ratio — ABNORMAL HIGH (ref 0.0–5.0)
Cholesterol, Total: 152 mg/dL (ref 100–199)
HDL: 26 mg/dL — ABNORMAL LOW (ref >39)
Triglycerides: 521 mg/dL — ABNORMAL HIGH (ref 0–149)

## 2018-08-28 LAB — CBC WITH DIFFERENTIAL/PLATELET
Basophils Absolute: 0.1 10*3/uL (ref 0.0–0.2)
Basos: 1 %
EOS (ABSOLUTE): 0.3 10*3/uL (ref 0.0–0.4)
Eos: 3 %
Hematocrit: 47.9 % (ref 37.5–51.0)
Hemoglobin: 16.3 g/dL (ref 13.0–17.7)
Immature Grans (Abs): 0 10*3/uL (ref 0.0–0.1)
Immature Granulocytes: 0 %
Lymphocytes Absolute: 3 10*3/uL (ref 0.7–3.1)
Lymphs: 27 %
MCH: 29.2 pg (ref 26.6–33.0)
MCHC: 34 g/dL (ref 31.5–35.7)
MCV: 86 fL (ref 79–97)
Monocytes Absolute: 0.8 10*3/uL (ref 0.1–0.9)
Monocytes: 7 %
Neutrophils Absolute: 7.1 10*3/uL — ABNORMAL HIGH (ref 1.4–7.0)
Neutrophils: 62 %
Platelets: 262 10*3/uL (ref 150–450)
RBC: 5.58 x10E6/uL (ref 4.14–5.80)
RDW: 12.8 % (ref 11.6–15.4)
WBC: 11.3 10*3/uL — ABNORMAL HIGH (ref 3.4–10.8)

## 2018-08-28 LAB — COMPREHENSIVE METABOLIC PANEL
ALT: 15 IU/L (ref 0–44)
AST: 14 IU/L (ref 0–40)
Albumin/Globulin Ratio: 1.3 (ref 1.2–2.2)
Albumin: 4 g/dL (ref 3.8–4.9)
Alkaline Phosphatase: 141 IU/L — ABNORMAL HIGH (ref 39–117)
BUN/Creatinine Ratio: 19 (ref 9–20)
BUN: 15 mg/dL (ref 6–24)
Bilirubin Total: 0.3 mg/dL (ref 0.0–1.2)
CO2: 17 mmol/L — ABNORMAL LOW (ref 20–29)
Calcium: 10 mg/dL (ref 8.7–10.2)
Chloride: 104 mmol/L (ref 96–106)
Creatinine, Ser: 0.77 mg/dL (ref 0.76–1.27)
GFR calc Af Amer: 116 mL/min/{1.73_m2} (ref >59)
GFR calc non Af Amer: 101 mL/min/{1.73_m2} (ref >59)
Globulin, Total: 3 g/dL (ref 1.5–4.5)
Glucose: 148 mg/dL — ABNORMAL HIGH (ref 65–99)
Potassium: 4 mmol/L (ref 3.5–5.2)
Sodium: 138 mmol/L (ref 134–144)
Total Protein: 7 g/dL (ref 6.0–8.5)

## 2018-10-09 ENCOUNTER — Telehealth: Payer: Self-pay | Admitting: *Deleted

## 2018-10-09 NOTE — Telephone Encounter (Signed)
Patient was prescribed Lantus in place of Levemir please disregard the prior authorization for Levemir.

## 2018-11-24 ENCOUNTER — Ambulatory Visit: Payer: 59 | Admitting: Emergency Medicine

## 2018-11-25 ENCOUNTER — Encounter: Payer: Self-pay | Admitting: Emergency Medicine

## 2018-12-01 ENCOUNTER — Other Ambulatory Visit: Payer: Self-pay | Admitting: Emergency Medicine

## 2018-12-01 DIAGNOSIS — E1165 Type 2 diabetes mellitus with hyperglycemia: Secondary | ICD-10-CM

## 2018-12-01 DIAGNOSIS — Z794 Long term (current) use of insulin: Secondary | ICD-10-CM

## 2018-12-01 MED ORDER — INSULIN REGULAR HUMAN 100 UNIT/ML IJ SOLN: 10.0000 [IU] | Freq: Three times a day (TID) | INTRAMUSCULAR | 11 refills | Status: DC

## 2018-12-01 MED ORDER — LANTUS SOLOSTAR 100 UNIT/ML ~~LOC~~ SOPN: 45.0000 [IU] | PEN_INJECTOR | Freq: Every day | SUBCUTANEOUS | 11 refills | Status: DC

## 2018-12-18 ENCOUNTER — Ambulatory Visit: Payer: 59 | Admitting: Emergency Medicine

## 2018-12-18 ENCOUNTER — Other Ambulatory Visit: Payer: Self-pay

## 2018-12-18 ENCOUNTER — Encounter: Payer: Self-pay | Admitting: Emergency Medicine

## 2018-12-18 VITALS — BP 121/69 | HR 80 | Temp 98.1°F | Ht 64.0 in | Wt 159.2 lb

## 2018-12-18 DIAGNOSIS — E782 Mixed hyperlipidemia: Secondary | ICD-10-CM

## 2018-12-18 DIAGNOSIS — Z794 Long term (current) use of insulin: Secondary | ICD-10-CM | POA: Diagnosis not present

## 2018-12-18 DIAGNOSIS — E1159 Type 2 diabetes mellitus with other circulatory complications: Secondary | ICD-10-CM

## 2018-12-18 DIAGNOSIS — E1169 Type 2 diabetes mellitus with other specified complication: Secondary | ICD-10-CM

## 2018-12-18 DIAGNOSIS — I10 Essential (primary) hypertension: Secondary | ICD-10-CM

## 2018-12-18 DIAGNOSIS — E1165 Type 2 diabetes mellitus with hyperglycemia: Secondary | ICD-10-CM

## 2018-12-18 DIAGNOSIS — I152 Hypertension secondary to endocrine disorders: Secondary | ICD-10-CM

## 2018-12-18 DIAGNOSIS — E785 Hyperlipidemia, unspecified: Secondary | ICD-10-CM

## 2018-12-18 LAB — POCT GLYCOSYLATED HEMOGLOBIN (HGB A1C): Hemoglobin A1C: 9.2 % — AB (ref 4.0–5.6)

## 2018-12-18 LAB — GLUCOSE, POCT (MANUAL RESULT ENTRY): POC Glucose: 319 mg/dl — AB (ref 70–99)

## 2018-12-18 MED ORDER — ATORVASTATIN CALCIUM 40 MG PO TABS: 40.0000 mg | ORAL_TABLET | Freq: Every day | ORAL | 3 refills | Status: DC

## 2018-12-18 MED ORDER — INSULIN NPH (HUMAN) (ISOPHANE) 100 UNIT/ML ~~LOC~~ SUSP: 20.0000 [IU] | Freq: Two times a day (BID) | SUBCUTANEOUS | 5 refills | Status: DC

## 2018-12-18 MED ORDER — LISINOPRIL 10 MG PO TABS: 10.0000 mg | ORAL_TABLET | Freq: Every day | ORAL | 3 refills | Status: DC

## 2018-12-18 MED ORDER — TRULICITY 0.75 MG/0.5ML ~~LOC~~ SOAJ: 0.7500 mg | SUBCUTANEOUS | 5 refills | Status: DC

## 2018-12-18 MED ORDER — METFORMIN HCL 1000 MG PO TABS: 1000.0000 mg | ORAL_TABLET | Freq: Two times a day (BID) | ORAL | 3 refills | Status: DC

## 2018-12-18 MED ORDER — GLIPIZIDE 5 MG PO TABS: 10.0000 mg | ORAL_TABLET | Freq: Two times a day (BID) | ORAL | 1 refills | Status: DC

## 2018-12-18 NOTE — Assessment & Plan Note (Signed)
Uncontrolled diabetes with hemoglobin A1c of 9.1, better than before.  Patient continues to take glipizide and Metformin as prescribed however he is taking Humulin insulin 18 units twice a day, different than prescribed before due to insurance's nonformulary policy.  Advised to continue Humulin insulin 20 units twice a day and will add Trulicity 2.69 mg weekly.  Diet and nutrition discussed with patient.  Follow-up in 3 months.

## 2018-12-18 NOTE — Progress Notes (Signed)
Lab Results  Component Value Date   HGBA1C 9.9 (A) 08/27/2018   BP Readings from Last 3 Encounters:  12/18/18 121/69  08/27/18 115/76  05/09/18 133/75   Lab Results  Component Value Date   CHOL 152 08/27/2018   HDL 26 (L) 08/27/2018   LDLCALC Comment 08/27/2018   TRIG 521 (H) 08/27/2018   CHOLHDL 5.8 (H) 08/27/2018   Joseph Roberts 58 y.o.   Chief Complaint  Patient presents with  . Diabetes    follow up  . Medication Refill    PEND    HISTORY OF PRESENT ILLNESS: This is a 58 y.o. male here for diabetes follow-up. 1.  Diabetes: Taking glipizide Metformin twice a day.  Also taking Humulin insulin 18 units twice a day. 2.  Hypertension: On lisinopril.  Doing well 3.  Dyslipidemia: On atorvastatin 40 mg daily.  HPI   Prior to Admission medications   Medication Sig Start Date End Date Taking? Authorizing Provider  atorvastatin (LIPITOR) 40 MG tablet Take 1 tablet (40 mg total) by mouth daily. 08/27/18  Yes Yacine Garriga, Ines Bloomer, MD  Insulin Glargine (LANTUS SOLOSTAR) 100 UNIT/ML Solostar Pen Inject 45 Units into the skin daily. Same time every day. 12/01/18  Yes Holland Kotter, Ines Bloomer, MD  lisinopril (PRINIVIL,ZESTRIL) 10 MG tablet Take 1 tablet (10 mg total) by mouth daily. 05/09/18  Yes Horald Pollen, MD  metFORMIN (GLUCOPHAGE) 1000 MG tablet Take 1 tablet (1,000 mg total) by mouth 2 (two) times daily with a meal. 05/09/18  Yes Alexandro Line, Ines Bloomer, MD  Blood Glucose Monitoring Suppl (ONE TOUCH ULTRA MINI) W/DEVICE KIT 1 each by Does not apply route daily. Test fasting daily    [provider]  CETIRIZINE HCL PO Take by mouth daily.    [provider]  glipiZIDE (GLUCOTROL) 5 MG tablet Take 2 tablets (10 mg total) by mouth 2 (two) times daily before a meal. 05/09/18 08/07/18  Ival Pacer, Ines Bloomer, MD  insulin regular (HUMULIN R) 100 units/mL injection Inject 0.1 mLs (10 Units total) into the skin 3 (three) times daily before meals. Patient not taking:  Reported on 12/18/2018 12/01/18   Horald Pollen, MD  Insulin Syringe-Needle U-100 (INSULIN SYRINGE .5CC/31GX5/16") 31G X 5/16" 0.5 ML MISC ADMINISTER INSULIN AS INSTRUCTED 04/18/18   Horald Pollen, MD    No Known Allergies  Patient Active Problem List   Diagnosis Date Noted  . Uncontrolled type 2 diabetes mellitus with hyperglycemia ([place]) 07/27/2017  . Essential hypertension 03/06/2016  . Uncontrolled type 2 diabetes mellitus with complication ([place])   . Dyslipidemia   . Type 2 diabetes mellitus with hyperglycemia ([place]) 03/05/2016  . 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 Dyslipidemia with low high density lipoprotein (HDL) cholesterol with hypertriglyceridemia due to type 2 diabetes mellitus ([place]) 11/03/2013  . Gall bladder polyp 10/24/2013  . GERD (gastroesophageal reflux disease) 10/24/2013  . Uncontrolled type 2 diabetes mellitus without complication, with long-term current use of insulin 10/23/2013    Past Medical History:  Diagnosis Date  . Diabetes mellitus 2011  . High triglycerides     Past Surgical History:  Procedure Laterality Date  . COLONOSCOPY N/A 10/24/2013   Procedure: COLONOSCOPY;  Surgeon: Ladene Artist, MD;  Location: [facility] ENDOSCOPY;  Service: Endoscopy;  Laterality: N/A;. diverticulosis, mild, small internal hemorrhoids    Social History   Socioeconomic History  . Marital status: Married    Spouse name: Not on file  . Number of children: Not on file  . Years  of education: Not on file  . Highest education level: Not on file  Occupational History  . Not on file  Social Needs  . Financial resource strain: Not on file  . Food insecurity    Worry: Not on file    Inability: Not on file  . Transportation needs    Medical: Not on file    Non-medical: Not on file  Tobacco Use  . Smoking status: Current Every Day Smoker    Packs/day: 0.50    Years: 30.00    Pack years: 15.00    Types: Cigarettes  . Smokeless tobacco: Never Used  Substance and  Sexual Activity  . Alcohol use: No  . Drug use: No  . Sexual activity: Not on file  Lifestyle  . Physical activity    Days per week: Not on file    Minutes per session: Not on file  . Stress: Not on file  Relationships  . Social Herbalist on phone: Not on file    Gets together: Not on file    Attends religious service: Not on file    Active member of club or organization: Not on file    Attends meetings of clubs or organizations: Not on file    Relationship status: Not on file  . Intimate partner violence    Fear of current or ex partner: Not on file    Emotionally abused: Not on file    Physically abused: Not on file    Forced sexual activity: Not on file  Other Topics Concern  . Not on file  Social History Narrative  . Not on file    Family History  Problem Relation Age of Onset  . Diabetes Mother   . Diabetes Sister   . Diabetes Brother      Review of Systems  Constitutional: Negative.  Negative for chills and fever.  HENT: Negative.  Negative for congestion and sore throat.   Eyes: Negative.   Respiratory: Negative.  Negative for cough and shortness of breath.   Cardiovascular: Negative.  Negative for chest pain and palpitations.  Gastrointestinal: Negative.  Negative for abdominal pain, diarrhea, nausea and vomiting.  Genitourinary: Negative.  Negative for dysuria and hematuria.  Musculoskeletal: Negative for myalgias.  Skin: Negative.  Negative for rash.  Neurological: Negative.  Negative for dizziness and headaches.  All other systems reviewed and are negative.   Vitals:   12/18/18 1020  BP: 121/69  Pulse: 80  Temp: 98.1 F (36.7 C)   Wt Readings from Last 3 Encounters:  12/18/18 159 lb 3.2 oz (72.2 kg)  08/27/18 159 lb 12.8 oz (72.5 kg)  05/09/18 163 lb 9.6 oz (74.2 kg)     Physical Exam Vitals signs reviewed.  Constitutional:      Appearance: Normal appearance.  HENT:     Head: Normocephalic.  Eyes:     Extraocular Movements:  Extraocular movements intact.     Pupils: Pupils are equal, round, and reactive to light.  Neck:     Musculoskeletal: Normal range of motion.  Cardiovascular:     Rate and Rhythm: Normal rate and regular rhythm.     Pulses: Normal pulses.     Heart sounds: Normal heart sounds.  Pulmonary:     Effort: Pulmonary effort is normal.  Abdominal:     Palpations: Abdomen is soft.     Tenderness: There is no abdominal tenderness.  Musculoskeletal: Normal range of motion.  Skin:    General: Skin  is warm and dry.  Neurological:     General: No focal deficit present.     Mental Status: He is alert and oriented to person, place, and time.  Psychiatric:        Mood and Affect: Mood normal.        Behavior: Behavior normal.      ASSESSMENT & PLAN: Uncontrolled type 2 diabetes mellitus with hyperglycemia (HCC) Uncontrolled diabetes with hemoglobin A1c of 9.1, better than before.  Patient continues to take glipizide and Metformin as prescribed however he is taking Humulin insulin 18 units twice a day, different than prescribed before due to insurance's nonformulary policy.  Advised to continue Humulin insulin 20 units twice a day and will add Trulicity 7.48 mg weekly.  Diet and nutrition discussed with patient.  Follow-up in 3 months.  Derryl was seen today for diabetes and medication refill.  Diagnoses and all orders for this visit:  Hypertension associated with diabetes ([place])  Dyslipidemia with low high density lipoprotein (HDL) cholesterol with hypertriglyceridemia due to type 2 diabetes mellitus (HCC) -     atorvastatin (LIPITOR) 40 MG tablet; Take 1 tablet (40 mg total) by mouth daily.  Essential hypertension -     CBC with Differential/Platelet -     Comprehensive metabolic panel -     lisinopril (ZESTRIL) 10 MG tablet; Take 1 tablet (10 mg total) by mouth daily.  Type 2 diabetes mellitus with hyperglycemia, with long-term current use of insulin (HCC) -     POCT glucose (manual entry)  -     POCT glycosylated hemoglobin (Hb A1C) -     Lipid panel -     Ambulatory referral to Ophthalmology -     metFORMIN (GLUCOPHAGE) 1000 MG tablet; Take 1 tablet (1,000 mg total) by mouth 2 (two) times daily with a meal. -     glipiZIDE (GLUCOTROL) 5 MG tablet; Take 2 tablets (10 mg total) by mouth 2 (two) times daily before a meal. -     Dulaglutide (TRULICITY) 2.70 BE/6.7JQ SOPN; Inject 0.75 mg into the skin once a week. -     insulin NPH Human (NOVOLIN N) 100 UNIT/ML injection; Inject 0.2 mLs (20 Units total) into the skin 2 (two) times daily.  Dyslipidemia associated with type 2 diabetes mellitus ([place])  Uncontrolled type 2 diabetes mellitus with hyperglycemia [facility])    Patient Instructions       If you have lab work done today you will be contacted with your lab results within the next 2 weeks.  If you have not heard from [place] then please contact us. The fastest way to get your results is to register for My Chart.   IF you received an x-ray today, you will receive an invoice from [facility]. Please contact [facility] at [phone] with questions or concerns regarding your invoice.   IF you received labwork today, you will receive an invoice from [place]. Please contact [facility] at [phone] with questions or concerns regarding your invoice.   Our billing staff will not be able to assist you with questions regarding bills from these companies.  You will be contacted with the lab results as soon as they are available. The fastest way to get your results is to activate your My Chart account. Instructions are located on the last page of this paperwork. If you have not heard from [place] regarding the results in 2 weeks, please contact this office.     Diabetes mellitus y nutricin, en adultos Diabetes  Mellitus and Nutrition, Adult Si sufre de diabetes (diabetes mellitus), es muy importante tener hbitos alimenticios saludables debido a que sus niveles de Teacher, adult education sangre (glucosa) se ven afectados en gran medida por lo que come y bebe. [place] alimentos saludables en las cantidades Bedias, aproximadamente a la [employer], [place] ayudar a:  Aeronautical engineer glucemia.  Disminuir el riesgo de sufrir una enfermedad cardaca.  Mejorar la presin arterial.  Science writer o mantener un peso saludable. Todas las personas que sufren de diabetes son diferentes y cada una tiene necesidades diferentes en cuanto a un plan de alimentacin. El mdico puede recomendarle que trabaje con un especialista en dietas y nutricin (nutricionista) para Financial trader plan para usted. Su plan de alimentacin puede variar segn factores como:  Las caloras que necesita.  Los medicamentos que toma.  Su peso.  Sus niveles de glucemia, presin arterial y colesterol.  Su nivel de [place].  Otras afecciones que tenga, como enfermedades cardacas o renales. Cmo me afectan los carbohidratos? Los carbohidratos, o hidratos de carbono, afectan su nivel de glucemia ms que cualquier otro tipo de alimento. La ingesta de carbohidratos naturalmente aumenta la cantidad de [employer]. El recuento de carbohidratos es un mtodo destinado a Catering manager un registro de la cantidad de carbohidratos que se consumen. El recuento de carbohidratos es importante para Theatre manager la glucemia a un nivel saludable, especialmente si utiliza insulina o toma determinados medicamentos por va oral para la diabetes. Es importante conocer la cantidad de carbohidratos que se pueden ingerir en cada comida sin correr Engineer, manufacturing. Esto es Psychologist, forensic. Su nutricionista puede ayudarlo a calcular la cantidad de carbohidratos que debe ingerir en cada comida y en cada refrigerio. Entre los alimentos que contienen carbohidratos, se incluyen:  Pan, cereal, arroz, pastas y galletas.  Papas y maz.  Guisantes, frijoles y lentejas.  Leche y Estate agent.  Lambert Mody y [place].  Postres, como  pasteles, galletas, helado y caramelos. Cmo me afecta el alcohol? El alcohol puede provocar disminuciones sbitas de la glucemia (hipoglucemia), especialmente si utiliza insulina o toma determinados medicamentos por va oral para la diabetes. La hipoglucemia es una afeccin potencialmente mortal. Los sntomas de la hipoglucemia (somnolencia, mareos y confusin) son similares a los sntomas de haber consumido demasiado alcohol. Si el mdico afirma que el alcohol es seguro para usted, [place] estas pautas:  Limite el consumo de alcohol a no ms de 27mdida por da si es mujer y no est e[place] y a 246midas si es hombre. Una medida equivale a 12oz (35559mde cerveza, 5oz (148m22me vino o 1oz (44ml104m bebidas alcohlicas de alta graduacin.  No beba con el estmago vaco.  Mantngase hidratado bebiendo agua, refrescos dietticos o t helado sin azcar.  Tenga en cuenta que los refrescos comunes, los jugos y otras bebida para mezclOptician, dispensingen contener mucha azcar y se deben contar como carbohidratos. Cules son algunos consejos para seguir este plan?  Leer las etiquetas de los alimentos  Comience por leer el tamao de la porcin en la "Informacin nutricional" en las etiquetas de los alimentos envasados y las bebidas. La cantidad de caloras, carbohidratos, grasas y otros nutrientes mencionados en la etiqueta se basan en una porcin del alimento. Muchos alimentos contienen ms de una porcin por envase.  Verifique la cantidad total de gramos (g) de carbohidratos totales en una porcin. Puede calcular la cantidad de porciones de carbohidratos al dividir el total de carbohidratos por 15. Por  ejemplo, si un alimento tiene un total de 30g de carbohidratos, equivale a 2 porciones de carbohidratos.  Verifique la cantidad de gramos (g) de grasas saturadas y grasas trans en una porcin. Escoja alimentos que no contengan grasa o que tengan un bajo contenido.  Verifique la cantidad de miligramos  (mg) de sal (sodio) en una porcin. [place] [employer] de las personas deben limitar la ingesta de sodio total a menos de 2321m por dTraining and development officer  Siempre consulte la informacin nutricional de los alimentos etiquetados como "con bajo contenido de grasa" o "sin grasa". Estos alimentos pueden tener un mayor contenido de aLocation manageragregada o carbohidratos refinados, y deben evitarse.  Hable con su nutricionista para identificar sus objetivos diarios en cuanto a los nutrientes mencionados en la etiqueta. Al ir de compras  Evite comprar alimentos procesados, enlatados o precocinados. Estos alimentos tienden a tSpecial educational needs teachermayor cantidad de gEl[place] sodio y azcar agregada.  Compre en la zona exterior de la tienda de comestibles. Esta zona incluye frutas y verduras frescas, granos a granel, carnes frescas y productos lcteos frescos. Al cocinar  Utilice mtodos de coccin a baja temperatura, como hornear, en lugar de mtodos de coccin a alta temperatura, como frer en abundante aceite.  Cocine con aceites saludables, como el aceite de oSeneca canola o gBay [place]  Evite cocinar con manteca, crema o carnes con alto contenido de grasa. Planificacin de las comidas  Coma las comidas y los refrigerios regularmente, preferentemente a la misma hora todos lDalmatia Evite pasar largos perodos de tiempo sin comer.  Consuma alimentos ricos en fibra, como frutas frescas, verduras, frijoles y cereales integrales. Consulte a su nutricionista sobre cuntas porciones de carbohidratos puede consumir en cada comida.  Consuma entre 4 y 6 onzas (oz) de protenas magras por da, como carnes mCove pollo, pescado, huevos o tofu. Una onza de protena magra equivale a: ? 1 onza de carne, pollo o pescado. ? 1huevo. ?  taza de tofu.  Coma algunos alimentos por da que contengan grasas saludables, como aguacates, frutos secos, semillas y pescado. Estilo de vida  Controle su nivel de glucemia con regularidad.  Haga actividad fsica  habitualmente como se lo haya indicado el mdico. Esto puede incluir lo siguiente: ? 1569mutos semanales de ejercicio de intensidad moderada o alta. Esto podra incluir caminatas dinmicas, ciclismo o gimnasia acutica. ? Realizar ejercicios de elongacin y de fortalecimiento, como yoga o levantamiento de pesas, por lo menos 2veces por semana.  Tome los meTenneco Ince lo haya indicado el mdico.  No consuma ningn producto que contenga nicotina o tabaco, como cigarrillos y ciPsychologist, sport and exerciseSi necesita ayuda para dejar de fumar, consulte al mdHess Corporationon un asesor o instructor en diabetes para identificar estrategias para controlar el estrs y cualquier desafo emocional y social. Preguntas para hacerle al mdico  Es necesario que consulte a unRadio broadcast assistantn el cuidado de la diabetes?  Es necesario que me rena con un nutricionista?  A qu nmero puedo llamar si tengo preguntas?  Cules son los mejores momentos para controlar la glucemia? Dnde encontrar ms informacin:  Asociacin Estadounidense de la Diabetes (American Diabetes Association): diabetes.org  Academia de Nutricin y DiInformation systems managerAcademy of Nutrition and Dietetics): www.eatright.orBorgeriabetes y las Enfermedades Digestivas y Renales (NRenown Rehabilitation Hospitalf Diabetes and Digestive and Kidney Diseases, NIH): wwDesMoinesFuneral.dkesumen  Un plan de alimentacin saludable lo ayudar a coAeronautical engineerlucemia y maTheatre managern estilo de vida saludable.  Trabajar con un especialista en  dietas y nutricin (nutricionista) puede ayudarlo a Insurance claims handler de alimentacin para usted.  Tenga en cuenta que los carbohidratos (hidratos de carbono) y el alcohol tienen efectos inmediatos en sus niveles de glucemia. Es importante contar los carbohidratos que ingiere y consumir alcohol con prudencia. Esta informacin no tiene Marine scientist el consejo del mdico. Asegrese de hacerle al  mdico cualquier pregunta que tenga. Document Released: 05/29/2007 Document Revised: 10/30/2016 Document Reviewed: 06/11/2016 Elsevier Patient Education  2020 Elsevier Inc.      Agustina Caroli, MD [facility]

## 2018-12-18 NOTE — Patient Instructions (Addendum)
   If you have lab work done today you will be contacted with your lab results within the next 2 weeks.  If you have not heard from us then please contact us. The fastest way to get your results is to register for My Chart.   IF you received an x-ray today, you will receive an invoice from [facility]. Please contact [facility] at [phone] with questions or concerns regarding your invoice.   IF you received labwork today, you will receive an invoice from [facility]. Please contact [facility] at [phone] with questions or concerns regarding your invoice.   Our billing staff will not be able to assist you with questions regarding bills from these companies.  You will be contacted with the lab results as soon as they are available. The fastest way to get your results is to activate your My Chart account. Instructions are located on the last page of this paperwork. If you have not heard from us regarding the results in 2 weeks, please contact this office.     Diabetes mellitus y nutricin, en adultos Diabetes Mellitus and Nutrition, Adult Si sufre de diabetes (diabetes mellitus), es muy importante tener hbitos alimenticios saludables debido a que sus niveles de azcar en la sangre (glucosa) se ven afectados en gran medida por lo que come y bebe. [place]er alimentos saludables en las cantidades adecuadas, aproximadamente a la misma hora todos los das, lo ayudar a:  Controlar la glucemia.  Disminuir el riesgo de sufrir una enfermedad cardaca.  Mejorar la presin arterial.  Alcanzar o mantener un peso saludable. Todas las personas que sufren de diabetes son diferentes y cada una tiene necesidades diferentes en cuanto a un plan de alimentacin. El mdico puede recomendarle que trabaje con un especialista en dietas y nutricin (nutricionista) para elaborar el mejor plan para usted. Su plan de alimentacin puede variar segn factores como:  Las caloras que  necesita.  Los medicamentos que toma.  Su peso.  Sus niveles de glucemia, presin arterial y colesterol.  Su nivel de actividad.  Otras afecciones que tenga, como enfermedades cardacas o renales. Cmo me afectan los carbohidratos? Los carbohidratos, o hidratos de carbono, afectan su nivel de glucemia ms que cualquier otro tipo de alimento. La ingesta de carbohidratos naturalmente aumenta la cantidad de glucosa en la sangre. El recuento de carbohidratos es un mtodo destinado a llevar un registro de la cantidad de carbohidratos que se consumen. El recuento de carbohidratos es importante para mantener la glucemia a un nivel saludable, especialmente si utiliza insulina o toma determinados medicamentos por va oral para la diabetes. Es importante conocer la cantidad de carbohidratos que se pueden ingerir en cada comida sin correr ningn riesgo. Esto es diferente en cada persona. Su nutricionista puede ayudarlo a calcular la cantidad de carbohidratos que debe ingerir en cada comida y en cada refrigerio. Entre los alimentos que contienen carbohidratos, se incluyen:  Pan, cereal, arroz, pastas y galletas.  Papas y maz.  Guisantes, frijoles y lentejas.  Leche y yogur.  Frutas y jugo.  Postres, como pasteles, galletas, helado y caramelos. Cmo me afecta el alcohol? El alcohol puede provocar disminuciones sbitas de la glucemia (hipoglucemia), especialmente si utiliza insulina o toma determinados medicamentos por va oral para la diabetes. La hipoglucemia es una afeccin potencialmente mortal. Los sntomas de la hipoglucemia (somnolencia, mareos y confusin) son similares a los sntomas de haber consumido demasiado alcohol. Si el mdico afirma que el alcohol es seguro para usted, siga estas pautas:    Limite el consumo de alcohol a no ms de 1medida por da si es mujer y no est embarazada, y a 2medidas si es hombre. Una medida equivale a 12oz (355ml) de cerveza, 5oz (148ml) de vino o  1oz (44ml) de bebidas alcohlicas de alta graduacin.  No beba con el estmago vaco.  Mantngase hidratado bebiendo agua, refrescos dietticos o t helado sin azcar.  Tenga en cuenta que los refrescos comunes, los jugos y otras bebida para mezclar pueden contener mucha azcar y se deben contar como carbohidratos. Cules son algunos consejos para seguir este plan?  Leer las etiquetas de los alimentos  Comience por leer el tamao de la porcin en la "Informacin nutricional" en las etiquetas de los alimentos envasados y las bebidas. La cantidad de caloras, carbohidratos, grasas y otros nutrientes mencionados en la etiqueta se basan en una porcin del alimento. Muchos alimentos contienen ms de una porcin por envase.  Verifique la cantidad total de gramos (g) de carbohidratos totales en una porcin. Puede calcular la cantidad de porciones de carbohidratos al dividir el total de carbohidratos por 15. Por ejemplo, si un alimento tiene un total de 30g de carbohidratos, equivale a 2 porciones de carbohidratos.  Verifique la cantidad de gramos (g) de grasas saturadas y grasas trans en una porcin. Escoja alimentos que no contengan grasa o que tengan un bajo contenido.  Verifique la cantidad de miligramos (mg) de sal (sodio) en una porcin. La mayora de las personas deben limitar la ingesta de sodio total a menos de 2300mg por da.  Siempre consulte la informacin nutricional de los alimentos etiquetados como "con bajo contenido de grasa" o "sin grasa". Estos alimentos pueden tener un mayor contenido de azcar agregada o carbohidratos refinados, y deben evitarse.  Hable con su nutricionista para identificar sus objetivos diarios en cuanto a los nutrientes mencionados en la etiqueta. Al ir de compras  Evite comprar alimentos procesados, enlatados o precocinados. Estos alimentos tienden a tener una mayor cantidad de grasa, sodio y azcar agregada.  Compre en la zona exterior de la tienda de  comestibles. Esta zona incluye frutas y verduras frescas, granos a granel, carnes frescas y productos lcteos frescos. Al cocinar  Utilice mtodos de coccin a baja temperatura, como hornear, en lugar de mtodos de coccin a alta temperatura, como frer en abundante aceite.  Cocine con aceites saludables, como el aceite de oliva, canola o girasol.  Evite cocinar con manteca, crema o carnes con alto contenido de grasa. Planificacin de las comidas  Coma las comidas y los refrigerios regularmente, preferentemente a la misma hora todos los das. Evite pasar largos perodos de tiempo sin comer.  Consuma alimentos ricos en fibra, como frutas frescas, verduras, frijoles y cereales integrales. Consulte a su nutricionista sobre cuntas porciones de carbohidratos puede consumir en cada comida.  Consuma entre 4 y 6 onzas (oz) de protenas magras por da, como carnes magras, pollo, pescado, huevos o tofu. Una onza de protena magra equivale a: ? 1 onza de carne, pollo o pescado. ? 1huevo. ?  taza de tofu.  Coma algunos alimentos por da que contengan grasas saludables, como aguacates, frutos secos, semillas y pescado. Estilo de vida  Controle su nivel de glucemia con regularidad.  Haga actividad fsica habitualmente como se lo haya indicado el mdico. Esto puede incluir lo siguiente: ? 150minutos semanales de ejercicio de intensidad moderada o alta. Esto podra incluir caminatas dinmicas, ciclismo o gimnasia acutica. ? Realizar ejercicios de elongacin y de fortalecimiento, como yoga o levantamiento   de pesas, por lo menos 2veces por semana.  Tome los medicamentos como se lo haya indicado el mdico.  No consuma ningn producto que contenga nicotina o tabaco, como cigarrillos y cigarrillos electrnicos. Si necesita ayuda para dejar de fumar, consulte al mdico.  Trabaje con un asesor o instructor en diabetes para identificar estrategias para controlar el estrs y cualquier desafo emocional  y social. Preguntas para hacerle al mdico  Es necesario que consulte a un instructor en el cuidado de la diabetes?  Es necesario que me rena con un nutricionista?  A qu nmero puedo llamar si tengo preguntas?  Cules son los mejores momentos para controlar la glucemia? Dnde encontrar ms informacin:  Asociacin Estadounidense de la Diabetes (American Diabetes Association): diabetes.org  Academia de Nutricin y Diettica (Academy of Nutrition and Dietetics): www.eatright.org  Instituto Nacional de la Diabetes y las Enfermedades Digestivas y Renales (National Institute of Diabetes and Digestive and Kidney Diseases, NIH): www.niddk.nih.gov Resumen  Un plan de alimentacin saludable lo ayudar a controlar la glucemia y mantener un estilo de vida saludable.  Trabajar con un especialista en dietas y nutricin (nutricionista) puede ayudarlo a elaborar el mejor plan de alimentacin para usted.  Tenga en cuenta que los carbohidratos (hidratos de carbono) y el alcohol tienen efectos inmediatos en sus niveles de glucemia. Es importante contar los carbohidratos que ingiere y consumir alcohol con prudencia. Esta informacin no tiene como fin reemplazar el consejo del mdico. Asegrese de hacerle al mdico cualquier pregunta que tenga. Document Released: 05/29/2007 Document Revised: 10/30/2016 Document Reviewed: 06/11/2016 Elsevier Patient Education  2020 Elsevier Inc.  

## 2018-12-19 LAB — CBC WITH DIFFERENTIAL/PLATELET
Basophils Absolute: 0.1 10*3/uL (ref 0.0–0.2)
Basos: 1 %
EOS (ABSOLUTE): 0.3 10*3/uL (ref 0.0–0.4)
Eos: 4 %
Hematocrit: 48.3 % (ref 37.5–51.0)
Hemoglobin: 16 g/dL (ref 13.0–17.7)
Immature Grans (Abs): 0 10*3/uL (ref 0.0–0.1)
Immature Granulocytes: 0 %
Lymphocytes Absolute: 2.5 10*3/uL (ref 0.7–3.1)
Lymphs: 31 %
MCH: 29.2 pg (ref 26.6–33.0)
MCHC: 33.1 g/dL (ref 31.5–35.7)
MCV: 88 fL (ref 79–97)
Monocytes Absolute: 0.7 10*3/uL (ref 0.1–0.9)
Monocytes: 9 %
Neutrophils Absolute: 4.6 10*3/uL (ref 1.4–7.0)
Neutrophils: 55 %
Platelets: 241 10*3/uL (ref 150–450)
RBC: 5.48 x10E6/uL (ref 4.14–5.80)
RDW: 12.7 % (ref 11.6–15.4)
WBC: 8.2 10*3/uL (ref 3.4–10.8)

## 2018-12-19 LAB — LIPID PANEL
Chol/HDL Ratio: 4.5 ratio (ref 0.0–5.0)
Cholesterol, Total: 121 mg/dL (ref 100–199)
HDL: 27 mg/dL — ABNORMAL LOW (ref >39)
LDL Chol Calc (NIH): 47 mg/dL (ref 0–99)
Triglycerides: 304 mg/dL — ABNORMAL HIGH (ref 0–149)
VLDL Cholesterol Cal: 47 mg/dL — ABNORMAL HIGH (ref 5–40)

## 2018-12-19 LAB — COMPREHENSIVE METABOLIC PANEL
ALT: 20 IU/L (ref 0–44)
AST: 16 IU/L (ref 0–40)
Albumin/Globulin Ratio: 1.6 (ref 1.2–2.2)
Albumin: 4.2 g/dL (ref 3.8–4.9)
Alkaline Phosphatase: 122 IU/L — ABNORMAL HIGH (ref 39–117)
BUN/Creatinine Ratio: 12 (ref 9–20)
BUN: 9 mg/dL (ref 6–24)
Bilirubin Total: 0.4 mg/dL (ref 0.0–1.2)
CO2: 17 mmol/L — ABNORMAL LOW (ref 20–29)
Calcium: 9.3 mg/dL (ref 8.7–10.2)
Chloride: 105 mmol/L (ref 96–106)
Creatinine, Ser: 0.76 mg/dL (ref 0.76–1.27)
GFR calc Af Amer: 117 mL/min/{1.73_m2} (ref >59)
GFR calc non Af Amer: 101 mL/min/{1.73_m2} (ref >59)
Globulin, Total: 2.7 g/dL (ref 1.5–4.5)
Glucose: 312 mg/dL — ABNORMAL HIGH (ref 65–99)
Potassium: 4 mmol/L (ref 3.5–5.2)
Sodium: 137 mmol/L (ref 134–144)
Total Protein: 6.9 g/dL (ref 6.0–8.5)

## 2019-01-28 ENCOUNTER — Other Ambulatory Visit: Payer: Self-pay | Admitting: Emergency Medicine

## 2019-01-28 DIAGNOSIS — E1165 Type 2 diabetes mellitus with hyperglycemia: Secondary | ICD-10-CM

## 2019-01-28 DIAGNOSIS — Z794 Long term (current) use of insulin: Secondary | ICD-10-CM

## 2019-03-18 ENCOUNTER — Ambulatory Visit: Payer: 59 | Admitting: Emergency Medicine

## 2019-05-22 ENCOUNTER — Other Ambulatory Visit: Payer: Self-pay | Admitting: Nurse Practitioner

## 2019-05-22 ENCOUNTER — Encounter: Payer: Self-pay | Admitting: Nurse Practitioner

## 2019-05-22 ENCOUNTER — Ambulatory Visit: Payer: Self-pay | Attending: Nurse Practitioner | Admitting: Nurse Practitioner

## 2019-05-22 DIAGNOSIS — Z1211 Encounter for screening for malignant neoplasm of colon: Secondary | ICD-10-CM

## 2019-05-22 DIAGNOSIS — E1169 Type 2 diabetes mellitus with other specified complication: Secondary | ICD-10-CM

## 2019-05-22 DIAGNOSIS — I10 Essential (primary) hypertension: Secondary | ICD-10-CM

## 2019-05-22 DIAGNOSIS — F17200 Nicotine dependence, unspecified, uncomplicated: Secondary | ICD-10-CM

## 2019-05-22 DIAGNOSIS — Z794 Long term (current) use of insulin: Secondary | ICD-10-CM

## 2019-05-22 DIAGNOSIS — Z130 Encounter for screening for diseases of the blood and blood-forming organs and certain disorders involving the immune mechanism: Secondary | ICD-10-CM

## 2019-05-22 DIAGNOSIS — E782 Mixed hyperlipidemia: Secondary | ICD-10-CM

## 2019-05-22 DIAGNOSIS — E1165 Type 2 diabetes mellitus with hyperglycemia: Secondary | ICD-10-CM

## 2019-05-22 DIAGNOSIS — R972 Elevated prostate specific antigen [PSA]: Secondary | ICD-10-CM

## 2019-05-22 MED ORDER — BD PEN NEEDLE MINI U/F 31G X 5 MM MISC: 3 refills | Status: DC

## 2019-05-22 MED ORDER — TRUEPLUS LANCETS 28G MISC: 3 refills | Status: DC

## 2019-05-22 MED ORDER — METFORMIN HCL 1000 MG PO TABS: 1000.0000 mg | ORAL_TABLET | Freq: Two times a day (BID) | ORAL | 3 refills | Status: DC

## 2019-05-22 MED ORDER — "INSULIN SYRINGE 31G X 5/16"" 0.5 ML MISC": 0 refills | Status: DC

## 2019-05-22 MED ORDER — TRUE METRIX BLOOD GLUCOSE TEST VI STRP: ORAL_STRIP | 12 refills | Status: DC

## 2019-05-22 MED ORDER — TRUE METRIX METER W/DEVICE KIT: PACK | 0 refills | Status: DC

## 2019-05-22 MED ORDER — TRULICITY 0.75 MG/0.5ML ~~LOC~~ SOAJ: SUBCUTANEOUS | 3 refills | Status: DC

## 2019-05-22 MED ORDER — INSULIN REGULAR HUMAN 100 UNIT/ML IJ SOLN: 20.0000 [IU] | Freq: Three times a day (TID) | INTRAMUSCULAR | 2 refills | Status: DC

## 2019-05-22 MED ORDER — LISINOPRIL 10 MG PO TABS: 10.0000 mg | ORAL_TABLET | Freq: Every day | ORAL | 0 refills | Status: DC

## 2019-05-22 MED ORDER — GLIPIZIDE 5 MG PO TABS: 10.0000 mg | ORAL_TABLET | Freq: Two times a day (BID) | ORAL | 1 refills | Status: DC

## 2019-05-22 MED ORDER — ATORVASTATIN CALCIUM 40 MG PO TABS: 40.0000 mg | ORAL_TABLET | Freq: Every day | ORAL | 3 refills | Status: DC

## 2019-05-22 MED FILL — metFORMIN HCL 1000 MG TABS: 1000 | 30 days supply | Qty: 60 | Fill #0

## 2019-05-22 MED FILL — ATORVASTATIN CALCIUM 40 MG: 40 | 30 days supply | Qty: 30 | Fill #0

## 2019-05-22 MED FILL — !HUMULIN R 100 UNITS/ML VIA: 100 | 33 days supply | Qty: 20 | Fill #0

## 2019-05-22 MED FILL — TRUEPLUS SYR 0.5ML 31GX5/16: 31G X 5/16" | 33 days supply | Qty: 100 | Fill #0

## 2019-05-22 MED FILL — TRUE METRIX GLUCOSE TEST ST: 33 days supply | Qty: 100 | Fill #0

## 2019-05-22 MED FILL — TRULICITY 0.75 MG/0.5 ML PE: 0.75 | 28 days supply | Qty: 2 | Fill #0

## 2019-05-22 MED FILL — LISINOPRIL 10 MG TABS: 10 | 30 days supply | Qty: 30 | Fill #0

## 2019-05-22 MED FILL — glipiZIDE 5 MG TABS: 5 | 30 days supply | Qty: 120 | Fill #0

## 2019-05-22 MED FILL — !TRUE METRIX BLOOD GLUCOSE: 30 days supply | Qty: 1 | Fill #0

## 2019-05-22 MED FILL — TRUEplus LANCETS 28G MISC: 33 days supply | Qty: 100 | Fill #0

## 2019-05-22 NOTE — Progress Notes (Signed)
Virtual Visit via Telephone Note Due to national recommendations of social distancing due to [place] 19, telehealth visit is felt to be most appropriate for this patient at this time.  I discussed the limitations, risks, security and privacy concerns of performing an evaluation and management service by telephone and the availability of in person appointments. I also discussed with the patient that there may be a patient responsible charge related to this service. The patient expressed understanding and agreed to proceed.    I connected with Chancy Hurter on 05/22/19  at   1:50 PM EDT  EDT by telephone and verified that I am speaking with the correct person using two identifiers.   Consent I discussed the limitations, risks, security and privacy concerns of performing an evaluation and management service by telephone and the availability of in person appointments. I also discussed with the patient that there may be a patient responsible charge related to this service. The patient expressed understanding and agreed to proceed.   Location of Patient: Private residence   Location of Provider: [place] and [place] participating in Telemedicine visit: Geryl Rankins FNP-BC [place]  Spanish Interpreter [place] [id number]   History of Present Illness: Telemedicine visit for: Establish care  has a past medical history of Diabetes mellitus (2011), High triglycerides, and Hypertension.  He was previously a patient of Dr. Mitchel Honour for with last office visit 12/18/2018. He is currently unemployed and lost his insurance so can no longer afford to see Dr. Mitchel Honour.     DM TYPE 2 Not well controlled or at goal of less than 7.  Current medications include Metformin 1000 mg twice daily,glipizide 10 mg twice daily and novel in a.m. 20 units twice daily. He was unaware he was supposed to be taking trulicity which was ordered in November.  He is now aware after discussion and will pick up from pharmacy . He denies any hypoglycemic or hyperglycemic symptoms.  Lab Results  Component Value Date   HGBA1C 9.2 (A) 12/18/2018    ESSENTIAL HYPERTENSION Well-controlled with lisinopril 10 mg daily.  He does not monitor his blood pressure at home as he does not have a device. Denies chest pain, shortness of breath, palpitations, lightheadedness, dizziness, headaches or BLE edema.  BP Readings from Last 3 Encounters:  12/18/18 121/69  08/27/18 115/76  05/09/18 133/75    Dyslipidemia LDL is at goal of less than 70 with atorvastatin 40 mg daily.  He denies any statin intolerance or myalgias. Lab Results  Component Value Date   LDLCALC 47 12/18/2018      Past Medical History:  Diagnosis Date  . Diabetes mellitus 2011  . High triglycerides   . Hypertension     Past Surgical History:  Procedure Laterality Date  . COLONOSCOPY N/A 10/24/2013   Procedure: COLONOSCOPY;  Surgeon: Ladene Artist, MD;  Location: [facility] ENDOSCOPY;  Service: Endoscopy;  Laterality: N/A;. diverticulosis, mild, small internal hemorrhoids    Family History  Problem Relation Age of Onset  . Diabetes Mother   . Diabetes Sister   . Diabetes Brother     Social History   Socioeconomic History  . Marital status: Married    Spouse name: Not on file  . Number of children: Not on file  . Years of education: Not on file  . Highest education level: Not on file  Occupational History  . Not on file  Tobacco Use  . Smoking status: Current Every Day  Smoker    Packs/day: 0.50    Years: 30.00    Pack years: 15.00    Types: Cigarettes  . Smokeless tobacco: Never Used  Substance and Sexual Activity  . Alcohol use: No  . Drug use: No  . Sexual activity: Not on file  Other Topics Concern  . Not on file  Social History Narrative  . Not on file   Social Determinants of Health   Financial Resource Strain:   . Difficulty of Paying Living Expenses:   Food  Insecurity:   . Worried About Charity fundraiser in the Last Year:   . Arboriculturist in the Last Year:   Transportation Needs:   . Film/video editor (Medical):   Marland Kitchen Lack of Transportation (Non-Medical):   Physical Activity:   . Days of Exercise per Week:   . Minutes of Exercise per Session:   Stress:   . Feeling of Stress :   Social Connections:   . Frequency of Communication with Friends and Family:   . Frequency of Social Gatherings with Friends and Family:   . Attends Religious Services:   . Active Member of Clubs or Organizations:   . Attends Archivist Meetings:   Marland Kitchen Marital Status:      Observations/Objective: Awake, alert and oriented x 3   Review of Systems  Constitutional: Negative for fever, malaise/fatigue and weight loss.  HENT: Negative.  Negative for nosebleeds.   Eyes: Negative.  Negative for blurred vision, double vision and photophobia.  Respiratory: Negative.  Negative for cough and shortness of breath.   Cardiovascular: Negative.  Negative for chest pain, palpitations and leg swelling.  Gastrointestinal: Negative.  Negative for heartburn, nausea and vomiting.  Musculoskeletal: Negative.  Negative for myalgias.  Neurological: Negative.  Negative for dizziness, focal weakness, seizures and headaches.  Psychiatric/Behavioral: Negative.  Negative for suicidal ideas.    Assessment and Plan: Magnum was seen today for establish care.  Diagnoses and all orders for this visit:  Tobacco dependence  Type 2 diabetes mellitus with hyperglycemia, with long-term current use of insulin (HCC) -     metFORMIN (GLUCOPHAGE) 1000 MG tablet; Take 1 tablet (1,000 mg total) by mouth 2 (two) times daily with a meal. -     Dulaglutide (TRULICITY) 8.65 HQ/4.6NG SOPN; ADMINISTER 0.75 MG UNDER THE SKIN 1 TIME A WEEK -     glipiZIDE (GLUCOTROL) 5 MG tablet; Take 2 tablets (10 mg total) by mouth 2 (two) times daily before a meal. -     Hemoglobin A1c;  Future  Essential hypertension -     lisinopril (ZESTRIL) 10 MG tablet; Take 1 tablet (10 mg total) by mouth daily. -     CMP14+EGFR; Future  Dyslipidemia with low high density lipoprotein (HDL) cholesterol with hypertriglyceridemia due to type 2 diabetes mellitus (HCC) -     atorvastatin (LIPITOR) 40 MG tablet; Take 1 tablet (40 mg total) by mouth daily. -     Lipid panel; Future  Elevated PSA -     PSA; Future  Colon cancer screening -     Fecal occult blood, imunochemical; Future  Screening for deficiency anemia -     CBC; Future  Other orders -     Blood Glucose Monitoring Suppl (TRUE METRIX METER) w/Device KIT; Use as instructed. Check blood glucose level by fingerstick three times per day. -     glucose blood (TRUE METRIX BLOOD GLUCOSE TEST) test strip; Use as instructed. Check blood  glucose level by fingerstick three times per day. -     TRUEplus Lancets 28G MISC; Use as instructed. Check blood glucose level by fingerstick three times per day. -     Insulin Pen Needle (B-D UF III MINI PEN NEEDLES) 31G X 5 MM MISC; Use as instructed. Inject into the skin once nightly. -     insulin regular (HUMULIN R) 100 units/mL injection; Inject 0.2 mLs (20 Units total) into the skin 3 (three) times daily before meals. -     Insulin Syringe-Needle U-100 (INSULIN SYRINGE .5CC/31GX5/16") 31G X 5/16" 0.5 ML MISC; ADMINISTER INSULIN AS INSTRUCTED     Follow Up Instructions Return in about 3 months (around 08/22/2019).     I discussed the assessment and treatment plan with the patient. The patient was provided an opportunity to ask questions and all were answered. The patient agreed with the plan and demonstrated an understanding of the instructions.   The patient was advised to call back or seek an in-person evaluation if the symptoms worsen or if the condition fails to improve as anticipated.  I provided 16 minutes of non-face-to-face time during this encounter including median intraservice  time, reviewing previous notes, labs, imaging, medications and explaining diagnosis and management.  Gildardo Pounds, FNP-BC

## 2019-05-26 ENCOUNTER — Ambulatory Visit: Payer: Self-pay | Attending: Family Medicine

## 2019-05-26 ENCOUNTER — Other Ambulatory Visit: Payer: Self-pay

## 2019-05-26 DIAGNOSIS — E1165 Type 2 diabetes mellitus with hyperglycemia: Secondary | ICD-10-CM

## 2019-05-26 DIAGNOSIS — Z794 Long term (current) use of insulin: Secondary | ICD-10-CM

## 2019-05-26 DIAGNOSIS — Z130 Encounter for screening for diseases of the blood and blood-forming organs and certain disorders involving the immune mechanism: Secondary | ICD-10-CM

## 2019-05-26 DIAGNOSIS — Z1211 Encounter for screening for malignant neoplasm of colon: Secondary | ICD-10-CM

## 2019-05-26 DIAGNOSIS — I10 Essential (primary) hypertension: Secondary | ICD-10-CM

## 2019-05-26 DIAGNOSIS — E1169 Type 2 diabetes mellitus with other specified complication: Secondary | ICD-10-CM

## 2019-05-26 DIAGNOSIS — R972 Elevated prostate specific antigen [PSA]: Secondary | ICD-10-CM

## 2019-05-26 DIAGNOSIS — E782 Mixed hyperlipidemia: Secondary | ICD-10-CM

## 2019-05-27 ENCOUNTER — Other Ambulatory Visit: Payer: Self-pay | Admitting: Nurse Practitioner

## 2019-05-27 DIAGNOSIS — R972 Elevated prostate specific antigen [PSA]: Secondary | ICD-10-CM

## 2019-05-27 LAB — CBC
Hematocrit: 48.8 % (ref 37.5–51.0)
Hemoglobin: 17.2 g/dL (ref 13.0–17.7)
MCH: 30.1 pg (ref 26.6–33.0)
MCHC: 35.2 g/dL (ref 31.5–35.7)
MCV: 86 fL (ref 79–97)
Platelets: 289 10*3/uL (ref 150–450)
RBC: 5.71 x10E6/uL (ref 4.14–5.80)
RDW: 12.8 % (ref 11.6–15.4)
WBC: 10.5 10*3/uL (ref 3.4–10.8)

## 2019-05-27 LAB — CMP14+EGFR
ALT: 22 IU/L (ref 0–44)
AST: 22 IU/L (ref 0–40)
Albumin/Globulin Ratio: 1.5 (ref 1.2–2.2)
Albumin: 4.6 g/dL (ref 3.8–4.9)
Alkaline Phosphatase: 141 IU/L — ABNORMAL HIGH (ref 39–117)
BUN/Creatinine Ratio: 19 (ref 9–20)
BUN: 18 mg/dL (ref 6–24)
Bilirubin Total: 0.6 mg/dL (ref 0.0–1.2)
CO2: 18 mmol/L — ABNORMAL LOW (ref 20–29)
Calcium: 9.7 mg/dL (ref 8.7–10.2)
Chloride: 105 mmol/L (ref 96–106)
Creatinine, Ser: 0.95 mg/dL (ref 0.76–1.27)
GFR calc Af Amer: 102 mL/min/{1.73_m2} (ref >59)
GFR calc non Af Amer: 88 mL/min/{1.73_m2} (ref >59)
Globulin, Total: 3 g/dL (ref 1.5–4.5)
Glucose: 144 mg/dL — ABNORMAL HIGH (ref 65–99)
Potassium: 4.4 mmol/L (ref 3.5–5.2)
Sodium: 138 mmol/L (ref 134–144)
Total Protein: 7.6 g/dL (ref 6.0–8.5)

## 2019-05-27 LAB — PSA: Prostate Specific Ag, Serum: 6.2 ng/mL — ABNORMAL HIGH (ref 0.0–4.0)

## 2019-05-27 LAB — LIPID PANEL
Chol/HDL Ratio: 3.4 ratio (ref 0.0–5.0)
Cholesterol, Total: 127 mg/dL (ref 100–199)
HDL: 37 mg/dL — ABNORMAL LOW (ref >39)
LDL Chol Calc (NIH): 70 mg/dL (ref 0–99)
Triglycerides: 107 mg/dL (ref 0–149)
VLDL Cholesterol Cal: 20 mg/dL (ref 5–40)

## 2019-05-27 LAB — HEMOGLOBIN A1C
Est. average glucose Bld gHb Est-mCnc: 148 mg/dL
Hgb A1c MFr Bld: 6.8 % — ABNORMAL HIGH (ref 4.8–5.6)

## 2019-05-28 LAB — FECAL OCCULT BLOOD, IMMUNOCHEMICAL: Fecal Occult Bld: NEGATIVE

## 2019-06-22 ENCOUNTER — Ambulatory Visit: Payer: Self-pay | Attending: Family Medicine

## 2019-06-22 ENCOUNTER — Other Ambulatory Visit: Payer: Self-pay

## 2019-06-22 ENCOUNTER — Ambulatory Visit: Payer: Self-pay

## 2019-06-22 DIAGNOSIS — R972 Elevated prostate specific antigen [PSA]: Secondary | ICD-10-CM

## 2019-06-23 LAB — URINALYSIS, ROUTINE W REFLEX MICROSCOPIC
Bilirubin, UA: NEGATIVE
Ketones, UA: NEGATIVE
Leukocytes,UA: NEGATIVE
Nitrite, UA: NEGATIVE
RBC, UA: NEGATIVE
Specific Gravity, UA: 1.023 (ref 1.005–1.030)
Urobilinogen, Ur: 0.2 mg/dL (ref 0.2–1.0)
pH, UA: 5.5 (ref 5.0–7.5)

## 2019-06-23 LAB — MICROSCOPIC EXAMINATION
Bacteria, UA: NONE SEEN
Casts: NONE SEEN /lpf
Epithelial Cells (non renal): NONE SEEN /hpf (ref 0–10)
WBC, UA: NONE SEEN /hpf (ref 0–5)

## 2019-06-24 LAB — URINE CYTOLOGY ANCILLARY ONLY
Bacterial Vaginitis-Urine: NEGATIVE
Candida Urine: NEGATIVE
Chlamydia: NEGATIVE
Comment: NEGATIVE
Comment: NEGATIVE
Comment: NORMAL
Neisseria Gonorrhea: NEGATIVE
Trichomonas: NEGATIVE

## 2019-07-10 ENCOUNTER — Ambulatory Visit: Payer: Self-pay | Admitting: Nurse Practitioner

## 2019-07-17 ENCOUNTER — Other Ambulatory Visit: Payer: Self-pay

## 2019-07-17 ENCOUNTER — Encounter: Payer: Self-pay | Admitting: Nurse Practitioner

## 2019-07-17 ENCOUNTER — Ambulatory Visit: Payer: Self-pay | Attending: Nurse Practitioner | Admitting: Nurse Practitioner

## 2019-07-17 VITALS — BP 119/72 | HR 64 | Temp 97.7°F | Ht 65.0 in | Wt 161.0 lb

## 2019-07-17 DIAGNOSIS — E1165 Type 2 diabetes mellitus with hyperglycemia: Secondary | ICD-10-CM

## 2019-07-17 DIAGNOSIS — R972 Elevated prostate specific antigen [PSA]: Secondary | ICD-10-CM

## 2019-07-17 DIAGNOSIS — Z794 Long term (current) use of insulin: Secondary | ICD-10-CM

## 2019-07-17 DIAGNOSIS — I10 Essential (primary) hypertension: Secondary | ICD-10-CM

## 2019-07-17 DIAGNOSIS — F17200 Nicotine dependence, unspecified, uncomplicated: Secondary | ICD-10-CM

## 2019-07-17 LAB — GLUCOSE, POCT (MANUAL RESULT ENTRY): POC Glucose: 115 mg/dl — AB (ref 70–99)

## 2019-07-17 NOTE — Progress Notes (Signed)
Assessment & Plan:  Joseph Roberts was seen today for blood pressure check.  Diagnoses and all orders for this visit:  Essential hypertension -     CMP14+EGFR Continue all antihypertensives as prescribed.  Remember to bring in your blood pressure log with you for your follow up appointment.  DASH/Mediterranean Diets are healthier choices for HTN.    Type 2 diabetes mellitus with hyperglycemia, with long-term current use of insulin (HCC) -     Glucose (CBG) Continue blood sugar control as discussed in office today, low carbohydrate diet, and regular physical exercise as tolerated, 150 minutes per week (30 min each day, 5 days per week, or 50 min 3 days per week). Keep blood sugar logs with fasting goal of 90-130 mg/dl, post prandial (after you eat) less than 180.  For Hypoglycemia: BS <60 and Hyperglycemia BS >400; contact the clinic ASAP. Annual eye exams and foot exams are recommended.   Tobacco dependence Joseph Roberts was counseled on the dangers of tobacco use, and was advised to quit. Reviewed strategies to maximize success, including removing cigarettes and smoking materials from environment, stress management and support of family/friends as well as pharmacological alternatives including: Wellbutrin, Chantix, Nicotine patch, Nicotine gum or lozenges. Smoking cessation support: smoking cessation hotline: 1-800-QUIT-NOW.  Smoking cessation classes are also available through [facility]. Call [phone] or visit our website at [email or web address]   A total of 3 minutes was spent on counseling for smoking cessation and Joseph Roberts is not ready to quit.   Elevated PSA -     PSA May need referral to Urology    Patient has been counseled on age-appropriate routine health concerns for screening and prevention. These are reviewed and          up-to-date. Referrals have been placed accordingly. Immunizations are up-to-date or declined.    Subjective:   Chief Complaint  Patient  presents with  . Blood Pressure Check    Pt. is here for blood pressure check.    HPI Joseph Roberts 59 y.o. male presents to office today for BP f/u.  Essential Hypertension Well controlled. He does not monitor his blood pressure at home. Taking lisinopril 10 mg daily as prescribed. Denies chest pain, shortness of breath, palpitations, lightheadedness, dizziness, headaches or BLE edema.  BP Readings from Last 3 Encounters:  07/17/19 119/72  12/18/18 121/69  08/27/18 115/76    Elevated PSA He has seen [facility] in the past. Last appt 07-2017. Will need to refer. PSA pending.   Review of Systems  Constitutional: Negative for fever, malaise/fatigue and weight loss.  HENT: Negative.  Negative for nosebleeds.   Eyes: Negative.  Negative for blurred vision, double vision and photophobia.  Respiratory: Negative.  Negative for cough and shortness of breath.   Cardiovascular: Negative.  Negative for chest pain, palpitations and leg swelling.  Gastrointestinal: Negative.  Negative for heartburn, nausea and vomiting.  Musculoskeletal: Negative.  Negative for myalgias.  Neurological: Negative.  Negative for dizziness, focal weakness, seizures and headaches.  Psychiatric/Behavioral: Negative.  Negative for suicidal ideas.    Past Medical History:  Diagnosis Date  . Diabetes mellitus 2011  . High triglycerides   . Hypertension     Past Surgical History:  Procedure Laterality Date  . COLONOSCOPY N/A 10/24/2013   Procedure: COLONOSCOPY;  Surgeon: Ladene Artist, MD;  Location: [facility] ENDOSCOPY;  Service: Endoscopy;  Laterality: N/A;. diverticulosis, mild, small internal hemorrhoids    Family History  Problem Relation Age of Onset  .  Diabetes Mother   . Diabetes Sister   . Diabetes Brother     Social History Reviewed with no changes to be made today.   Outpatient Medications Prior to Visit  Medication Sig Dispense Refill  . atorvastatin (LIPITOR) 40 MG tablet Take 1  tablet (40 mg total) by mouth daily. 90 tablet 3  . Blood Glucose Monitoring Suppl (ONE TOUCH ULTRA MINI) W/DEVICE KIT 1 each by Does not apply route daily. Test fasting daily    . Blood Glucose Monitoring Suppl (TRUE METRIX METER) w/Device KIT Use as instructed. Check blood glucose level by fingerstick three times per day. 1 kit 0  . CETIRIZINE HCL PO Take by mouth daily.    . Dulaglutide (TRULICITY) 9.98 PJ/8.2NK SOPN ADMINISTER 0.75 MG UNDER THE SKIN 1 TIME A WEEK 3 pen 3  . glipiZIDE (GLUCOTROL) 5 MG tablet Take 2 tablets (10 mg total) by mouth 2 (two) times daily before a meal. 360 tablet 1  . glucose blood (TRUE METRIX BLOOD GLUCOSE TEST) test strip Use as instructed. Check blood glucose level by fingerstick three times per day. 100 each 12  . Insulin Pen Needle (B-D UF III MINI PEN NEEDLES) 31G X 5 MM MISC Use as instructed. Inject into the skin once nightly. 100 each 3  . insulin regular (HUMULIN R) 100 units/mL injection Inject 0.2 mLs (20 Units total) into the skin 3 (three) times daily before meals. 60 mL 2  . Insulin Syringe-Needle U-100 (INSULIN SYRINGE .5CC/31GX5/16") 31G X 5/16" 0.5 ML MISC ADMINISTER INSULIN AS INSTRUCTED 100 each 0  . lisinopril (ZESTRIL) 10 MG tablet Take 1 tablet (10 mg total) by mouth daily. 90 tablet 0  . metFORMIN (GLUCOPHAGE) 1000 MG tablet Take 1 tablet (1,000 mg total) by mouth 2 (two) times daily with a meal. 180 tablet 3  . TRUEplus Lancets 28G MISC Use as instructed. Check blood glucose level by fingerstick three times per day. 100 each 3   No facility-administered medications prior to visit.    No Known Allergies     Objective:    BP 119/72 (BP Location: Left Arm, Patient Position: Sitting, Cuff Size: Normal)   Pulse 64   Temp 97.7 F (36.5 C) (Temporal)   Ht _0  (1.651 m)   Wt 161 lb (73 kg)   SpO2 100%   BMI 26.79 kg/m  Wt Readings from Last 3 Encounters:  07/17/19 161 lb (73 kg)  12/18/18 159 lb 3.2 oz (72.2 kg)  08/27/18 159 lb  12.8 oz (72.5 kg)    Physical Exam Vitals and nursing note reviewed.  Constitutional:      Appearance: He is well-developed.  HENT:     Head: Normocephalic and atraumatic.  Cardiovascular:     Rate and Rhythm: Normal rate and regular rhythm.     Heart sounds: Normal heart sounds. No murmur. No friction rub. No gallop.   Pulmonary:     Effort: Pulmonary effort is normal. No tachypnea or respiratory distress.     Breath sounds: Normal breath sounds. No decreased breath sounds, wheezing, rhonchi or rales.  Chest:     Chest wall: No tenderness.  Abdominal:     General: Bowel sounds are normal.     Palpations: Abdomen is soft.  Musculoskeletal:        General: Normal range of motion.     Cervical back: Normal range of motion.  Skin:    General: Skin is warm and dry.  Neurological:     Mental  Status: He is alert and oriented to person, place, and time.     Coordination: Coordination normal.  Psychiatric:        Behavior: Behavior normal. Behavior is cooperative.        Thought Content: Thought content normal.        Judgment: Judgment normal.          Patient has been counseled extensively about nutrition and exercise as well as the importance of adherence with medications and regular follow-up. The patient was given clear instructions to go to ER or return to medical center if symptoms don't improve, worsen or new problems develop. The patient verbalized understanding.   Follow-up: Return in about 3 months (around 10/17/2019).   Gildardo Pounds, FNP-BC [facility] and [facility], [place]   07/17/2019, 9:07 AM

## 2019-07-18 ENCOUNTER — Other Ambulatory Visit: Payer: Self-pay | Admitting: Nurse Practitioner

## 2019-07-18 DIAGNOSIS — R972 Elevated prostate specific antigen [PSA]: Secondary | ICD-10-CM

## 2019-07-18 LAB — CMP14+EGFR
ALT: 16 IU/L (ref 0–44)
AST: 15 IU/L (ref 0–40)
Albumin/Globulin Ratio: 1.4 (ref 1.2–2.2)
Albumin: 4.1 g/dL (ref 3.8–4.9)
Alkaline Phosphatase: 110 IU/L (ref 39–117)
BUN/Creatinine Ratio: 13 (ref 9–20)
BUN: 11 mg/dL (ref 6–24)
Bilirubin Total: 0.6 mg/dL (ref 0.0–1.2)
CO2: 21 mmol/L (ref 20–29)
Calcium: 9.4 mg/dL (ref 8.7–10.2)
Chloride: 107 mmol/L — ABNORMAL HIGH (ref 96–106)
Creatinine, Ser: 0.86 mg/dL (ref 0.76–1.27)
GFR calc Af Amer: 110 mL/min/{1.73_m2} (ref >59)
GFR calc non Af Amer: 96 mL/min/{1.73_m2} (ref >59)
Globulin, Total: 2.9 g/dL (ref 1.5–4.5)
Glucose: 92 mg/dL (ref 65–99)
Potassium: 4.2 mmol/L (ref 3.5–5.2)
Sodium: 141 mmol/L (ref 134–144)
Total Protein: 7 g/dL (ref 6.0–8.5)

## 2019-07-18 LAB — PSA: Prostate Specific Ag, Serum: 6.9 ng/mL — ABNORMAL HIGH (ref 0.0–4.0)

## 2019-08-24 ENCOUNTER — Ambulatory Visit: Payer: Self-pay | Admitting: Nurse Practitioner

## 2019-09-11 MED FILL — LISINOPRIL 10 MG TABS: 10 | 30 days supply | Qty: 30 | Fill #1

## 2019-09-11 MED FILL — ATORVASTATIN CALCIUM 40 MG: 40 | 30 days supply | Qty: 30 | Fill #2

## 2019-09-11 MED FILL — TRULICITY 0.75 MG/0.5 ML PE: 0.75 | 28 days supply | Qty: 2 | Fill #1

## 2019-09-11 MED FILL — glipiZIDE 5 MG TABS: 5 | 30 days supply | Qty: 120 | Fill #2

## 2019-09-11 MED FILL — metFORMIN HCL 1000 MG TABS: 1000 | 30 days supply | Qty: 60 | Fill #2

## 2019-09-16 ENCOUNTER — Other Ambulatory Visit: Payer: Self-pay

## 2019-09-16 ENCOUNTER — Ambulatory Visit: Payer: Self-pay

## 2019-10-19 ENCOUNTER — Ambulatory Visit: Payer: Self-pay | Attending: Nurse Practitioner | Admitting: Nurse Practitioner

## 2019-10-19 ENCOUNTER — Other Ambulatory Visit: Payer: Self-pay | Admitting: Nurse Practitioner

## 2019-10-19 ENCOUNTER — Encounter: Payer: Self-pay | Admitting: Nurse Practitioner

## 2019-10-19 ENCOUNTER — Other Ambulatory Visit: Payer: Self-pay

## 2019-10-19 DIAGNOSIS — E1165 Type 2 diabetes mellitus with hyperglycemia: Secondary | ICD-10-CM

## 2019-10-19 DIAGNOSIS — E782 Mixed hyperlipidemia: Secondary | ICD-10-CM

## 2019-10-19 DIAGNOSIS — E1169 Type 2 diabetes mellitus with other specified complication: Secondary | ICD-10-CM

## 2019-10-19 DIAGNOSIS — R972 Elevated prostate specific antigen [PSA]: Secondary | ICD-10-CM

## 2019-10-19 DIAGNOSIS — I10 Essential (primary) hypertension: Secondary | ICD-10-CM

## 2019-10-19 DIAGNOSIS — Z794 Long term (current) use of insulin: Secondary | ICD-10-CM

## 2019-10-19 MED ORDER — LISINOPRIL 5 MG PO TABS: 5.0000 mg | ORAL_TABLET | Freq: Every day | ORAL | 0 refills | Status: DC

## 2019-10-19 MED ORDER — TRULICITY 0.75 MG/0.5ML ~~LOC~~ SOAJ: 0.7500 mg | SUBCUTANEOUS | 0 refills | Status: DC

## 2019-10-19 MED ORDER — TERAZOSIN HCL 1 MG PO CAPS: 1.0000 mg | ORAL_CAPSULE | Freq: Every day | ORAL | 1 refills | Status: DC

## 2019-10-19 MED ORDER — INSULIN REGULAR HUMAN 100 UNIT/ML IJ SOLN: 20.0000 [IU] | Freq: Three times a day (TID) | INTRAMUSCULAR | 2 refills | Status: DC

## 2019-10-19 MED ORDER — GLIPIZIDE 5 MG PO TABS: 10.0000 mg | ORAL_TABLET | Freq: Two times a day (BID) | ORAL | 1 refills | Status: DC

## 2019-10-19 MED FILL — TRULICITY 0.75 MG/0.5 ML PE: 0.75 | 28 days supply | Qty: 2 | Fill #0

## 2019-10-19 MED FILL — ?HUMULIN R 100 UNITS/ML VIA: 100 | 33 days supply | Qty: 20 | Fill #0

## 2019-10-19 MED FILL — LISINOPRIL 5 MG TABLET: 5 | 30 days supply | Qty: 30 | Fill #0

## 2019-10-19 MED FILL — ?GLIPIZIDE 5MG TABLET: 5 | 30 days supply | Qty: 120 | Fill #0

## 2019-10-19 NOTE — Progress Notes (Signed)
Virtual Visit via Telephone Note Due to national recommendations of social distancing due to COVID 19, telehealth visit is felt to be most appropriate for this patient at this time.  I discussed the limitations, risks, security and privacy concerns of performing an evaluation and management service by telephone and the availability of in person appointments. I also discussed with the patient that there may be a patient responsible charge related to this service. The patient expressed understanding and agreed to proceed.    I connected with Joseph Roberts on 10/19/19  at  10:30 AM EDT  EDT by telephone and verified that I am speaking with the correct person using two identifiers.   Consent I discussed the limitations, risks, security and privacy concerns of performing an evaluation and management service by telephone and the availability of in person appointments. I also discussed with the patient that there may be a patient responsible charge related to this service. The patient expressed understanding and agreed to proceed.   Location of Patient: Private Residence   Location of Provider: [facility]    Persons participating in Telemedicine visit: Bertram Denver FNP-BC [facility] CMA Federico Maiorino  Spanish Interpreter ID# [id number]   History of Present Illness: Telemedicine visit for: Follow  up  Requesting a refill of Hytrin that he had been prescribed in the past by another PCP. He has symptoms of BPH including urinary hesitancy. There is no nocturia.   DM TYPE 2 Well controlled. Blood glucose level this morning postprandial was 144. Monitoring his blood glucose levels 3 times per week. Currently taking glipizide 10 mg BID, metformin 1000 mg BID, humulin 20 units TID,  trulicity 0.75 mg weekly.  LDL at goal with atorvastatin 40 mg daily.  Lab Results  Component Value Date   HGBA1C 6.8 (H) 05/26/2019   Lab Results  Component Value Date    LDLCALC 70 05/26/2019   Blood pressure is well controlled on renal dose lisinopril 5mg  daily. Denies chest pain, shortness of breath, palpitations, lightheadedness, dizziness, headaches or BLE edema.  BP Readings from Last 3 Encounters:  07/17/19 119/72  12/18/18 121/69  08/27/18 115/76    Past Medical History:  Diagnosis Date  . Diabetes mellitus 2011  . High triglycerides   . Hypertension     Past Surgical History:  Procedure Laterality Date  . COLONOSCOPY N/A 10/24/2013   Procedure: COLONOSCOPY;  Surgeon: 10/26/2013, MD;  Location: [facility] ENDOSCOPY;  Service: Endoscopy;  Laterality: N/A;. diverticulosis, mild, small internal hemorrhoids    Family History  Problem Relation Age of Onset  . Diabetes Mother   . Diabetes Sister   . Diabetes Brother     Social History   Socioeconomic History  . Marital status: Widowed    Spouse name: Not on file  . Number of children: Not on file  . Years of education: Not on file  . Highest education level: Not on file  Occupational History  . Not on file  Tobacco Use  . Smoking status: Current Every Day Smoker    Packs/day: 0.50    Years: 30.00    Pack years: 15.00    Types: Cigarettes  . Smokeless tobacco: Never Used  Substance and Sexual Activity  . Alcohol use: No  . Drug use: No  . Sexual activity: Not on file  Other Topics Concern  . Not on file  Social History Narrative  . Not on file   Social Determinants of Health   Financial Resource  Strain:   . Difficulty of Paying Living Expenses: Not on file  Food Insecurity:   . Worried About Programme researcher, broadcasting/film/video in the Last Year: Not on file  . Ran Out of Food in the Last Year: Not on file  Transportation Needs:   . Lack of Transportation (Medical): Not on file  . Lack of Transportation (Non-Medical): Not on file  Physical Activity:   . Days of Exercise per Week: Not on file  . Minutes of Exercise per Session: Not on file  Stress:   . Feeling of Stress : Not on file   Social Connections:   . Frequency of Communication with Friends and Family: Not on file  . Frequency of Social Gatherings with Friends and Family: Not on file  . Attends Religious Services: Not on file  . Active Member of Clubs or Organizations: Not on file  . Attends Banker Meetings: Not on file  . Marital Status: Not on file     Observations/Objective: Awake, alert and oriented x 3   Review of Systems  Constitutional: Negative for fever, malaise/fatigue and weight loss.  HENT: Negative.  Negative for nosebleeds.   Eyes: Negative.  Negative for blurred vision, double vision and photophobia.  Respiratory: Negative.  Negative for cough and shortness of breath.   Cardiovascular: Negative.  Negative for chest pain, palpitations and leg swelling.  Gastrointestinal: Negative.  Negative for heartburn, nausea and vomiting.  Musculoskeletal: Negative.  Negative for myalgias.  Neurological: Negative.  Negative for dizziness, focal weakness, seizures and headaches.  Psychiatric/Behavioral: Negative.  Negative for suicidal ideas.    Assessment and Plan: Eleuterio was seen today for follow-up.  Diagnoses and all orders for this visit:  Type 2 diabetes mellitus with hyperglycemia, with long-term current use of insulin (HCC) -     insulin regular (HUMULIN R) 100 units/mL injection; Inject 0.2 mLs (20 Units total) into the skin 3 (three) times daily before meals. -     glipiZIDE (GLUCOTROL) 5 MG tablet; Take 2 tablets (10 mg total) by mouth 2 (two) times daily before a meal. -     Dulaglutide (TRULICITY) 0.75 MG/0.5ML SOPN; Inject 0.5 mLs (0.75 mg total) into the skin once a week. Continue blood sugar control as discussed in office today, low carbohydrate diet, and regular physical exercise as tolerated, 150 minutes per week (30 min each day, 5 days per week, or 50 min 3 days per week). Keep blood sugar logs with fasting goal of 90-130 mg/dl, post prandial (after you eat) less than 180.   For Hypoglycemia: BS <60 and Hyperglycemia BS >400; contact the clinic ASAP. Annual eye exams and foot exams are recommended.   Essential hypertension -     lisinopril (ZESTRIL) 5 MG tablet; Take 1 tablet (5 mg total) by mouth daily. Continue all antihypertensives as prescribed.  Remember to bring in your blood pressure log with you for your follow up appointment.  DASH/Mediterranean Diets are healthier choices for HTN.    Dyslipidemia with low high density lipoprotein (HDL) cholesterol with hypertriglyceridemia due to type 2 diabetes mellitus (HCC) INSTRUCTIONS: Work on a low fat, heart healthy diet and participate in regular aerobic exercise program by working out at least 150 minutes per week; 5 days a week-30 minutes per day. Avoid red meat/beef/steak,  fried foods. junk foods, sodas, sugary drinks, unhealthy snacking, alcohol and smoking.  Drink at least 80 oz of water per day and monitor your carbohydrate intake daily.   Elevated PSA -  terazosin (HYTRIN) 1 MG capsule; Take 1 capsule (1 mg total) by mouth daily for 90 doses.     Follow Up Instructions Return in about 3 months (around 01/19/2020) for labs in september.     I discussed the assessment and treatment plan with the patient. The patient was provided an opportunity to ask questions and all were answered. The patient agreed with the plan and demonstrated an understanding of the instructions.   The patient was advised to call back or seek an in-person evaluation if the symptoms worsen or if the condition fails to improve as anticipated.  I provided 17 minutes of non-face-to-face time during this encounter including median intraservice time, reviewing previous notes, labs, imaging, medications and explaining diagnosis and management.  Claiborne Rigg, FNP-BC

## 2019-10-20 MED FILL — TERAZOSIN 1 MG CAPSULE: 1 | 30 days supply | Qty: 30 | Fill #0

## 2019-10-25 ENCOUNTER — Encounter: Payer: Self-pay | Admitting: Nurse Practitioner

## 2019-11-17 ENCOUNTER — Other Ambulatory Visit: Payer: Self-pay | Admitting: Nurse Practitioner

## 2019-11-17 ENCOUNTER — Ambulatory Visit: Payer: Self-pay | Attending: Nurse Practitioner

## 2019-11-17 ENCOUNTER — Other Ambulatory Visit: Payer: Self-pay

## 2019-11-17 DIAGNOSIS — E1165 Type 2 diabetes mellitus with hyperglycemia: Secondary | ICD-10-CM

## 2019-11-17 DIAGNOSIS — R8290 Unspecified abnormal findings in urine: Secondary | ICD-10-CM

## 2019-11-17 DIAGNOSIS — E1169 Type 2 diabetes mellitus with other specified complication: Secondary | ICD-10-CM

## 2019-11-17 DIAGNOSIS — E782 Mixed hyperlipidemia: Secondary | ICD-10-CM

## 2019-11-17 MED FILL — metFORMIN HCL 1000 MG TABS: 1000 | 30 days supply | Qty: 60 | Fill #3

## 2019-11-18 LAB — URINALYSIS, COMPLETE
Bilirubin, UA: NEGATIVE
Glucose, UA: NEGATIVE
Leukocytes,UA: NEGATIVE
Nitrite, UA: NEGATIVE
RBC, UA: NEGATIVE
Specific Gravity, UA: >=1.03 — AB (ref 1.005–1.030)
Urobilinogen, Ur: 1 mg/dL (ref 0.2–1.0)
pH, UA: 5 (ref 5.0–7.5)

## 2019-11-18 LAB — MICROSCOPIC EXAMINATION
Bacteria, UA: NONE SEEN
WBC, UA: NONE SEEN /hpf (ref 0–5)

## 2019-11-18 LAB — LIPID PANEL
Chol/HDL Ratio: 3.3 ratio (ref 0.0–5.0)
Cholesterol, Total: 106 mg/dL (ref 100–199)
HDL: 32 mg/dL — ABNORMAL LOW (ref >39)
LDL Chol Calc (NIH): 50 mg/dL (ref 0–99)
Triglycerides: 135 mg/dL (ref 0–149)
VLDL Cholesterol Cal: 24 mg/dL (ref 5–40)

## 2019-11-18 LAB — HEMOGLOBIN A1C
Est. average glucose Bld gHb Est-mCnc: 206 mg/dL
Hgb A1c MFr Bld: 8.8 % — ABNORMAL HIGH (ref 4.8–5.6)

## 2019-11-18 LAB — BASIC METABOLIC PANEL
BUN/Creatinine Ratio: 16 (ref 9–20)
BUN: 17 mg/dL (ref 6–24)
CO2: 21 mmol/L (ref 20–29)
Calcium: 10.5 mg/dL — ABNORMAL HIGH (ref 8.7–10.2)
Chloride: 102 mmol/L (ref 96–106)
Creatinine, Ser: 1.04 mg/dL (ref 0.76–1.27)
GFR calc Af Amer: 91 mL/min/{1.73_m2} (ref >59)
GFR calc non Af Amer: 79 mL/min/{1.73_m2} (ref >59)
Glucose: 161 mg/dL — ABNORMAL HIGH (ref 65–99)
Potassium: 4.5 mmol/L (ref 3.5–5.2)
Sodium: 136 mmol/L (ref 134–144)

## 2019-11-23 ENCOUNTER — Other Ambulatory Visit: Payer: Self-pay | Admitting: Nurse Practitioner

## 2019-11-23 DIAGNOSIS — N059 Unspecified nephritic syndrome with unspecified morphologic changes: Secondary | ICD-10-CM

## 2019-12-01 ENCOUNTER — Other Ambulatory Visit: Payer: Self-pay | Admitting: Nurse Practitioner

## 2019-12-01 ENCOUNTER — Other Ambulatory Visit: Payer: Self-pay

## 2019-12-01 ENCOUNTER — Encounter: Payer: Self-pay | Admitting: Nurse Practitioner

## 2019-12-01 ENCOUNTER — Ambulatory Visit: Payer: Self-pay | Attending: Nurse Practitioner | Admitting: Nurse Practitioner

## 2019-12-01 VITALS — BP 109/66 | HR 62 | Temp 96.4°F | Ht 65.0 in | Wt 163.0 lb

## 2019-12-01 DIAGNOSIS — Z794 Long term (current) use of insulin: Secondary | ICD-10-CM

## 2019-12-01 DIAGNOSIS — E1165 Type 2 diabetes mellitus with hyperglycemia: Secondary | ICD-10-CM

## 2019-12-01 DIAGNOSIS — Z23 Encounter for immunization: Secondary | ICD-10-CM

## 2019-12-01 DIAGNOSIS — E785 Hyperlipidemia, unspecified: Secondary | ICD-10-CM

## 2019-12-01 DIAGNOSIS — K824 Cholesterolosis of gallbladder: Secondary | ICD-10-CM

## 2019-12-01 DIAGNOSIS — G8929 Other chronic pain: Secondary | ICD-10-CM

## 2019-12-01 DIAGNOSIS — M5442 Lumbago with sciatica, left side: Secondary | ICD-10-CM

## 2019-12-01 LAB — GLUCOSE, POCT (MANUAL RESULT ENTRY): POC Glucose: 180 mg/dl — AB (ref 70–99)

## 2019-12-01 MED ORDER — TRUEPLUS LANCETS 28G MISC: 3 refills | Status: DC

## 2019-12-01 MED ORDER — TRULICITY 0.75 MG/0.5ML ~~LOC~~ SOAJ: 0.7500 mg | SUBCUTANEOUS | 0 refills | Status: AC

## 2019-12-01 MED ORDER — GLIPIZIDE 5 MG PO TABS: 10.0000 mg | ORAL_TABLET | Freq: Two times a day (BID) | ORAL | 1 refills | Status: DC

## 2019-12-01 MED ORDER — TRUE METRIX BLOOD GLUCOSE TEST VI STRP: ORAL_STRIP | 12 refills | Status: DC

## 2019-12-01 MED ORDER — MELOXICAM 7.5 MG PO TABS: 7.5000 mg | ORAL_TABLET | Freq: Every day | ORAL | 2 refills | Status: DC

## 2019-12-01 MED ORDER — BD PEN NEEDLE MINI U/F 31G X 5 MM MISC: 3 refills | Status: DC

## 2019-12-01 MED ORDER — INSULIN LISPRO (1 UNIT DIAL) 100 UNIT/ML (KWIKPEN): 20.0000 [IU] | PEN_INJECTOR | Freq: Three times a day (TID) | SUBCUTANEOUS | 1 refills | Status: DC

## 2019-12-01 MED ORDER — METHOCARBAMOL 500 MG PO TABS: 500.0000 mg | ORAL_TABLET | Freq: Three times a day (TID) | ORAL | 2 refills | Status: AC | PRN

## 2019-12-01 MED FILL — TRULICITY 0.75 MG/0.5 ML PE: 0.75 | 28 days supply | Qty: 2 | Fill #0

## 2019-12-01 MED FILL — TRUEplus LANCETS 28G MISC: 33 days supply | Qty: 100 | Fill #0

## 2019-12-01 MED FILL — MELOXICAM 7.5 MG TABLET: 7.5 | 30 days supply | Qty: 30 | Fill #0

## 2019-12-01 MED FILL — TRUE METRIX TEST STRIP: 30 days supply | Qty: 100 | Fill #0

## 2019-12-01 MED FILL — glipiZIDE 5 MG TABS: 5 | 30 days supply | Qty: 120 | Fill #0

## 2019-12-01 MED FILL — TRUEPLUS 5-BEVEL PEN NEEDLE: 31G X 5 MM | 25 days supply | Qty: 100 | Fill #0

## 2019-12-01 MED FILL — ?HUMALOG 100 UNITS/ML KWIKP: 100 | 25 days supply | Qty: 15 | Fill #0

## 2019-12-01 MED FILL — ?METHOCARBAMOL 500 MG TABLE: 500 | 20 days supply | Qty: 60 | Fill #0

## 2019-12-01 NOTE — Progress Notes (Signed)
Assessment & Plan:  Joseph Roberts was seen today for blood pressure check.  Diagnoses and all orders for this visit:  Type 2 diabetes mellitus with hyperglycemia, with long-term current use of insulin (HCC) -     Glucose (CBG) -     Dulaglutide (TRULICITY) 5.62 ZH/0.8MV SOPN; Inject 0.75 mg into the skin once a week. -     glipiZIDE (GLUCOTROL) 5 MG tablet; Take 2 tablets (10 mg total) by mouth 2 (two) times daily before a meal. -     glucose blood (TRUE METRIX BLOOD GLUCOSE TEST) test strip; Use as instructed. Check blood glucose level by fingerstick three times per day. -     TRUEplus Lancets 28G MISC; Use as instructed. Check blood glucose level by fingerstick three times per day. -     insulin lispro (HUMALOG KWIKPEN) 100 UNIT/ML KwikPen; Inject 20 Units into the skin 3 (three) times daily. -     Insulin Pen Needle (B-D UF III MINI PEN NEEDLES) 31G X 5 MM MISC; Use as instructed. Inject into the skin 4 times daily Continue blood sugar control as discussed in office today, low carbohydrate diet, and regular physical exercise as tolerated, 150 minutes per week (30 min each day, 5 days per week, or 50 min 3 days per week). Keep blood sugar logs with fasting goal of 90-130 mg/dl, post prandial (after you eat) less than 180.  For Hypoglycemia: BS <60 and Hyperglycemia BS >400; contact the clinic ASAP. Annual eye exams and foot exams are recommended.   Essential hypertension Continue all antihypertensives as prescribed.  Remember to bring in your blood pressure log with you for your follow up appointment.  DASH/Mediterranean Diets are healthier choices for HTN.   Chronic bilateral low back pain with left-sided sciatica -     methocarbamol (ROBAXIN) 500 MG tablet; Take 1 tablet (500 mg total) by mouth every 8 (eight) hours as needed for muscle spasms. -     DG Lumbar Spine Complete; Future -     meloxicam (MOBIC) 7.5 MG tablet; Take 1 tablet (7.5 mg total) by mouth daily. Work on losing weight to  help reduce back pain. May alternate with heat and ice application for pain relief. May also alternate with acetaminophen as prescribed for back pain. Other alternatives include massage, acupuncture and water aerobics.  You must stay active and avoid a sedentary lifestyle.   Gallbladder polyp noted on previous imaging -     US Abdomen Limited RUQ; Future  Need for immunization against influenza -     Flu Vaccine QUAD 6+ mos PF IM (Fluarix Quad PF)    Patient has been counseled on age-appropriate routine health concerns for screening and prevention. These are reviewed and up-to-date. Referrals have been placed accordingly. Immunizations are up-to-date or declined.    Subjective:   Chief Complaint  Patient presents with  . Blood Pressure Check    Pt. is here for blood pressure check.    HPI Joseph Roberts 59 y.o. male presents to office today for HTN.  has a past medical history of Diabetes mellitus (2011), High triglycerides, and Hypertension.  Blood pressure is well controlled with renal dose lisinopril 5 mg daily. Denies chest pain, shortness of breath, palpitations, lightheadedness, dizziness, headaches or BLE edema.   BP Readings from Last 3 Encounters:  12/01/19 109/66  07/17/19 119/72  12/18/18 121/69   DM TYPE 2 Monitoring his blood glucose levels 3x per week and not daily. Currently taking metformin 1000 mg BID,  Glipizide 10 mg BID and Humalog 20 units TID. Joseph Roberts has not been taking trulicity 6.81 mg. LDL at goal with atorvastatin 40 mg daily.  Lab Results  Component Value Date   HGBA1C 8.8 (H) 11/17/2019    Lab Results  Component Value Date   LDLCALC 50 11/17/2019    Low Back Pain Chronic.   Worse with prolonged sitting, standing and walking. There is left sided sciatica. 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   Review of Systems  Constitutional: Negative for fever, malaise/fatigue and weight loss.    HENT: Negative.  Negative for nosebleeds.   Eyes: Negative.  Negative for blurred vision, double vision and photophobia.  Respiratory: Negative.  Negative for cough and shortness of breath.   Cardiovascular: Negative.  Negative for chest pain, palpitations and leg swelling.  Gastrointestinal: Negative.  Negative for heartburn, nausea and vomiting.  Musculoskeletal: Positive for back pain and myalgias.  Neurological: Positive for sensory change. Negative for dizziness, focal weakness, seizures and headaches.  Psychiatric/Behavioral: Negative.  Negative for suicidal ideas.      Past Medical History:  Diagnosis Date  . Diabetes mellitus 2011  . High triglycerides   . Hypertension     Past Surgical History:  Procedure Laterality Date  . COLONOSCOPY N/A 10/24/2013   Procedure: COLONOSCOPY;  Surgeon: Ladene Artist, MD;  Location: [facility] ENDOSCOPY;  Service: Endoscopy;  Laterality: N/A;. diverticulosis, mild, small internal hemorrhoids    Family History  Problem Relation Age of Onset  . Diabetes Mother   . Diabetes Sister   . Diabetes Brother     Social History Reviewed with no changes to be made today.   Outpatient Medications Prior to Visit  Medication Sig Dispense Refill  . atorvastatin (LIPITOR) 40 MG tablet Take 1 tablet (40 mg total) by mouth daily. 90 tablet 3  . Blood Glucose Monitoring Suppl (ONE TOUCH ULTRA MINI) W/DEVICE KIT 1 each by Does not apply route daily. Test fasting daily    . Blood Glucose Monitoring Suppl (TRUE METRIX METER) w/Device KIT Use as instructed. Check blood glucose level by fingerstick three times per day. 1 kit 0  . CETIRIZINE HCL PO Take by mouth daily.    Marland Kitchen lisinopril (ZESTRIL) 5 MG tablet Take 1 tablet (5 mg total) by mouth daily. 90 tablet 0  . metFORMIN (GLUCOPHAGE) 1000 MG tablet Take 1 tablet (1,000 mg total) by mouth 2 (two) times daily with a meal. 180 tablet 3  . terazosin (HYTRIN) 1 MG capsule Take 1 capsule (1 mg total) by mouth daily for  90 doses. 90 capsule 1  . Dulaglutide (TRULICITY) 2.75 TZ/0.0FV SOPN Inject 0.5 mLs (0.75 mg total) into the skin once a week. 6 mL 0  . glipiZIDE (GLUCOTROL) 5 MG tablet Take 2 tablets (10 mg total) by mouth 2 (two) times daily before a meal. 360 tablet 1  . glucose blood (TRUE METRIX BLOOD GLUCOSE TEST) test strip Use as instructed. Check blood glucose level by fingerstick three times per day. 100 each 12  . Insulin Pen Needle (B-D UF III MINI PEN NEEDLES) 31G X 5 MM MISC Use as instructed. Inject into the skin once nightly. 100 each 3  . insulin regular (HUMULIN R) 100 units/mL injection Inject 0.2 mLs (20 Units total) into the skin 3 (three) times daily before meals. 60 mL 2  . Insulin Syringe-Needle U-100 (INSULIN SYRINGE .5CC/31GX5/16") 31G X 5/16" 0.5 ML MISC ADMINISTER  INSULIN AS INSTRUCTED 100 each 0  . TRUEplus Lancets 28G MISC Use as instructed. Check blood glucose level by fingerstick three times per day. 100 each 3   No facility-administered medications prior to visit.    No Known Allergies     Objective:    BP 109/66 (BP Location: Left Arm, Patient Position: Sitting, Cuff Size: Normal)   Pulse 62   Temp (!) 96.4 F (35.8 C) (Temporal)   Ht '5\' 5"'  (1.651 m)   Wt 163 lb (73.9 kg)   SpO2 99%   BMI 27.12 kg/m  Wt Readings from Last 3 Encounters:  12/01/19 163 lb (73.9 kg)  07/17/19 161 lb (73 kg)  12/18/18 159 lb 3.2 oz (72.2 kg)    Physical Exam Vitals and nursing note reviewed.  Constitutional:      Appearance: Joseph Roberts is well-developed.  HENT:     Head: Normocephalic and atraumatic.  Cardiovascular:     Rate and Rhythm: Normal rate and regular rhythm.     Heart sounds: Normal heart sounds. No murmur heard.  No friction rub. No gallop.   Pulmonary:     Effort: Pulmonary effort is normal. No tachypnea or respiratory distress.     Breath sounds: Normal breath sounds. No decreased breath sounds, wheezing, rhonchi or rales.  Chest:     Chest wall: No tenderness.    Abdominal:     General: Bowel sounds are normal.     Palpations: Abdomen is soft.  Musculoskeletal:        General: Normal range of motion.     Cervical back: Normal range of motion.  Skin:    General: Skin is warm and dry.  Neurological:     Mental Status: Joseph Roberts is alert and oriented to person, place, and time.     Coordination: Coordination normal.  Psychiatric:        Behavior: Behavior normal. Behavior is cooperative.        Thought Content: Thought content normal.        Judgment: Judgment normal.          Patient has been counseled extensively about nutrition and exercise as well as the importance of adherence with medications and regular follow-up. The patient was given clear instructions to go to ER or return to medical center if symptoms don't improve, worsen or new problems develop. The patient verbalized understanding.   Follow-up: Return in about 4 weeks (around 12/29/2019) for meter check and back pain. Bring meter.   Gildardo Pounds, FNP-BC [facility] and [facility], [place]   12/01/2019, 7:11 PM

## 2019-12-04 ENCOUNTER — Ambulatory Visit (HOSPITAL_COMMUNITY)
Admission: RE | Admit: 2019-12-04 | Discharge: 2019-12-04 | Disposition: A | Discharge status: Home / Self Care | Payer: Self-pay | Source: Ambulatory Visit | Attending: Nurse Practitioner | Admitting: Nurse Practitioner

## 2019-12-04 DIAGNOSIS — M5442 Lumbago with sciatica, left side: Secondary | ICD-10-CM

## 2019-12-04 DIAGNOSIS — K824 Cholesterolosis of gallbladder: Secondary | ICD-10-CM | POA: Insufficient documentation

## 2019-12-04 DIAGNOSIS — G8929 Other chronic pain: Secondary | ICD-10-CM

## 2020-01-04 ENCOUNTER — Telehealth (INDEPENDENT_AMBULATORY_CARE_PROVIDER_SITE_OTHER): Payer: Self-pay | Admitting: Nurse Practitioner

## 2020-01-04 NOTE — Telephone Encounter (Signed)
Copied from CRM [id number]. Topic: General - Other >> Jan 04, 2020  4:22 PM Marylen Ponto wrote: Reason for CRM: Terri with [facility] called regarding referral they received. Terri requests call back. Cb# [phone] Options# 3

## 2020-01-11 ENCOUNTER — Telehealth: Payer: Self-pay | Admitting: Nurse Practitioner

## 2020-01-11 NOTE — Telephone Encounter (Signed)
I call the Pt, to inform him that he has an appty with [facility] on 01/19/20 @ 8am and he need to bring the copayment of $50.00 at the time of the visit, Pt can't hear me well enought

## 2020-01-12 NOTE — Telephone Encounter (Signed)
They call me  from [facility]  to let me know  that   Joseph [id number] Patient has an appointment  01-19-2020 @ 8:30am  at  [facility]

## 2020-01-12 NOTE — Telephone Encounter (Signed)
Called pt and notified of appt details. Pt needs to arrive at 8am (appt is 8:30). Pt aware.

## 2020-01-15 ENCOUNTER — Other Ambulatory Visit: Payer: Self-pay | Admitting: Nurse Practitioner

## 2020-01-15 MED FILL — METFORMIN HCL 1000 MG TABS: 1000 | 30 days supply | Qty: 60 | Fill #4

## 2020-01-15 MED FILL — ?GLIPIZIDE 5MG TABLET: 5 | 30 days supply | Qty: 120 | Fill #1

## 2020-01-15 MED FILL — ?TRULICITY 0.75 MG/0.5ML PE: 0.75 | 28 days supply | Qty: 2 | Fill #0

## 2020-01-15 MED FILL — ?ATORVASTATIN 40MG TABLET: 40 | 30 days supply | Qty: 30 | Fill #3

## 2020-01-15 MED FILL — TERAZOSIN 1 MG CAPSULE: 1 | 30 days supply | Qty: 30 | Fill #1

## 2020-01-15 MED FILL — METHOCARBAMOL 500 MG TABS: 500 | 20 days supply | Qty: 60 | Fill #1

## 2020-01-19 ENCOUNTER — Other Ambulatory Visit: Payer: Self-pay | Admitting: Internal Medicine

## 2020-01-19 MED FILL — LISINOPRIL 10 MG TABS: 10 | 30 days supply | Qty: 30 | Fill #0

## 2020-02-24 MED FILL — ?GLIPIZIDE 5MG TABLET: 5 | 30 days supply | Qty: 120 | Fill #2

## 2020-02-24 MED FILL — METFORMIN HCL 1000 MG TABS: 1000 | 30 days supply | Qty: 60 | Fill #5

## 2020-02-24 MED FILL — !TRULICITY 0.75 MG/0.5 ML P: 0.75 | 28 days supply | Qty: 2 | Fill #1

## 2020-02-24 MED FILL — METHOCARBAMOL 500 MG TABS: 500 | 20 days supply | Qty: 60 | Fill #2

## 2020-02-24 MED FILL — TERAZOSIN 1 MG CAPSULE: 1 | 30 days supply | Qty: 30 | Fill #2

## 2020-02-24 MED FILL — ?ATORVASTATIN 40MG TABLET: 40 | 30 days supply | Qty: 30 | Fill #4

## 2020-02-24 MED FILL — LISINOPRIL 10 MG TABS: 10 | 30 days supply | Qty: 30 | Fill #1

## 2020-03-06 ENCOUNTER — Other Ambulatory Visit: Payer: Self-pay | Admitting: Nurse Practitioner

## 2020-04-01 MED FILL — ?GLIPIZIDE 5MG TABLET: 5 | 30 days supply | Qty: 120 | Fill #3

## 2020-04-01 MED FILL — TRULICITY 0.75 MG/0.5 ML PE: 0.75 | 28 days supply | Qty: 2 | Fill #2

## 2020-04-01 MED FILL — LISINOPRIL 10 MG TABS: 10 | 30 days supply | Qty: 30 | Fill #2

## 2020-04-01 MED FILL — METFORMIN HCL 1000 MG TABS: 1000 | 30 days supply | Qty: 60 | Fill #6

## 2020-04-01 MED FILL — ?ATORVASTATIN 40MG TABLET: 40 | 30 days supply | Qty: 30 | Fill #5

## 2020-05-13 MED FILL — ?METFORMIN HCL 1000 MG TAB: 1000 | 30 days supply | Qty: 60 | Fill #7

## 2020-05-13 MED FILL — glipiZIDE 5 MG TABS: 5 | 30 days supply | Qty: 120 | Fill #4

## 2020-05-13 MED FILL — ?TRULICITY 0.75 MG/0.5ML PE: 0.75 | 28 days supply | Qty: 2 | Fill #3

## 2020-05-13 MED FILL — ?ATORVASTATIN 40MG TABLET: 40 | 30 days supply | Qty: 30 | Fill #6

## 2020-05-13 MED FILL — LISINOPRIL 10 MG TABS: 10 | 30 days supply | Qty: 30 | Fill #3

## 2020-06-30 ENCOUNTER — Other Ambulatory Visit: Payer: Self-pay

## 2020-08-18 ENCOUNTER — Other Ambulatory Visit: Payer: Self-pay

## 2020-08-18 ENCOUNTER — Ambulatory Visit (INDEPENDENT_AMBULATORY_CARE_PROVIDER_SITE_OTHER): Payer: Self-pay | Admitting: Primary Care

## 2020-08-18 VITALS — BP 121/77 | HR 93 | Temp 97.5°F | Resp 16 | Wt 154.0 lb

## 2020-08-18 DIAGNOSIS — E782 Mixed hyperlipidemia: Secondary | ICD-10-CM

## 2020-08-18 DIAGNOSIS — E1169 Type 2 diabetes mellitus with other specified complication: Secondary | ICD-10-CM

## 2020-08-18 DIAGNOSIS — I10 Essential (primary) hypertension: Secondary | ICD-10-CM

## 2020-08-18 DIAGNOSIS — E1165 Type 2 diabetes mellitus with hyperglycemia: Secondary | ICD-10-CM

## 2020-08-18 DIAGNOSIS — Z794 Long term (current) use of insulin: Secondary | ICD-10-CM

## 2020-08-18 DIAGNOSIS — R059 Cough, unspecified: Secondary | ICD-10-CM

## 2020-08-18 LAB — POCT GLYCOSYLATED HEMOGLOBIN (HGB A1C): Hemoglobin A1C: 10.4 % — AB (ref 4.0–5.6)

## 2020-08-18 LAB — GLUCOSE, POCT (MANUAL RESULT ENTRY): POC Glucose: 328 mg/dl — AB (ref 70–99)

## 2020-08-18 NOTE — Progress Notes (Signed)
Establish care  Not currently taking any medication for 2 mos.   Cough and mucous x 3 day  - white phlegm

## 2020-08-19 ENCOUNTER — Other Ambulatory Visit: Payer: Self-pay

## 2020-08-19 MED ORDER — DULAGLUTIDE 0.75 MG/0.5ML ~~LOC~~ SOAJ
0.7500 mg | SUBCUTANEOUS | 6 refills | Status: DC
  Filled 2020-08-19: qty 2, 28d supply, fill #0
  Filled 2020-09-21: qty 2, 28d supply, fill #1
  Filled 2020-10-26: qty 2, 28d supply, fill #2

## 2020-08-19 MED ORDER — ATORVASTATIN CALCIUM 40 MG PO TABS
40.0000 mg | ORAL_TABLET | Freq: Every day | ORAL | 1 refills | Status: DC
  Filled 2020-08-19: qty 30, 30d supply, fill #0
  Filled 2020-09-21: qty 30, 30d supply, fill #1
  Filled 2021-01-02: qty 30, 30d supply, fill #2

## 2020-08-19 MED ORDER — METFORMIN HCL 1000 MG PO TABS
1000.0000 mg | ORAL_TABLET | Freq: Two times a day (BID) | ORAL | 1 refills | Status: DC
  Filled 2020-08-19: qty 60, 30d supply, fill #0
  Filled 2020-09-21: qty 60, 30d supply, fill #1
  Filled 2020-10-26: qty 60, 30d supply, fill #2

## 2020-08-19 MED ORDER — LISINOPRIL 10 MG PO TABS
10.0000 mg | ORAL_TABLET | Freq: Every day | ORAL | 1 refills | Status: DC
  Filled 2020-08-19: qty 30, 30d supply, fill #0
  Filled 2020-09-21: qty 30, 30d supply, fill #1
  Filled 2020-10-26: qty 30, 30d supply, fill #2

## 2020-08-19 MED ORDER — INSULIN LISPRO 100 UNIT/ML IJ SOLN
12.0000 [IU] | Freq: Three times a day (TID) | INTRAMUSCULAR | 3 refills | Status: DC
  Filled 2020-08-19: qty 10, 27d supply, fill #0
  Filled 2020-09-21: qty 10, 27d supply, fill #1

## 2020-08-28 NOTE — Progress Notes (Signed)
New Patient Office Visit  Subjective:  Patient ID: Joseph Roberts, male    DOB: 1960/03/13  Age: 60 y.o. MRN: [id number]  CC:  Chief Complaint  Patient presents with   Establish Care   Medication Refill   Cough    HPI Joseph Roberts presents for re-establishment of care. She is requesting medication refills and having a lingering cough  Past Medical History:  Diagnosis Date   Diabetes mellitus 2011   High triglycerides    Hypertension     Past Surgical History:  Procedure Laterality Date   COLONOSCOPY N/A 10/24/2013   Procedure: COLONOSCOPY;  Surgeon: Ladene Artist, MD;  Location: [facility];  Service: Endoscopy;  Laterality: N/A;. diverticulosis, mild, small internal hemorrhoids    Family History  Problem Relation Age of Onset   Diabetes Mother    Diabetes Sister    Diabetes Brother     Social History   Socioeconomic History   Marital status: Widowed    Spouse name: Not on file   Number of children: Not on file   Years of education: Not on file   Highest education level: Not on file  Occupational History   Not on file  Tobacco Use   Smoking status: Every Day    Packs/day: 0.50    Years: 30.00    Pack years: 15.00    Types: Cigarettes   Smokeless tobacco: Never  Substance and Sexual Activity   Alcohol use: No   Drug use: No   Sexual activity: Not on file  Other Topics Concern   Not on file  Social History Narrative   Not on file   Social Determinants of Health   Financial Resource Strain: Not on file  Food Insecurity: Not on file  Transportation Needs: Not on file  Physical Activity: Not on file  Stress: Not on file  Social Connections: Not on file  Intimate Partner Violence: Not on file    ROS Review of Systems  HENT:  Positive for congestion.   Respiratory:  Positive for cough.   All other systems reviewed and are negative.  Objective:   Today's Vitals: BP 121/77 (BP Location: Left Arm, Patient Position: Sitting,  Cuff Size: Normal)   Pulse 93   Temp (!) 97.5 F (36.4 C)   Resp 16   Wt 154 lb (69.9 kg)   SpO2 99%   BMI 25.63 kg/m   Physical Exam Vitals reviewed.  Constitutional:      Appearance: Normal appearance.  HENT:     Head: Normocephalic.     Right Ear: Tympanic membrane normal.     Left Ear: Tympanic membrane normal.     Nose: Nose normal.  Eyes:     Extraocular Movements: Extraocular movements intact.     Pupils: Pupils are equal, round, and reactive to light.  Cardiovascular:     Rate and Rhythm: Normal rate and regular rhythm.  Pulmonary:     Effort: Pulmonary effort is normal.     Breath sounds: Normal breath sounds.  Abdominal:     General: Bowel sounds are normal.     Palpations: Abdomen is soft.  Musculoskeletal:        General: Normal range of motion.     Cervical back: Normal range of motion and neck supple.  Skin:    General: Skin is warm and dry.  Neurological:     Mental Status: He is alert and oriented to person, place, and time.  Psychiatric:  Mood and Affect: Mood normal.        Behavior: Behavior normal.        Thought Content: Thought content normal.        Judgment: Judgment normal.   Assessment & Plan:   [name] was seen today for establish care, medication refill and cough.  Diagnoses and all orders for this visit:  Type 2 diabetes mellitus with hyperglycemia, with long-term current use of insulin ([place]) Uncontrolled medication adjusted  Discussed  co- morbidities with uncontrol diabetes  Complications -diabetic retinopathy, (close your eyes ? What do you see nothing) nephropathy decrease in kidney function- can lead to dialysis-on a machine 3 days a week to filter your kidney, neuropathy- numbness and tinging in your hands and feet,  increase risk of heart attack and stroke, and amputation due to decrease wound healing and circulation. Decrease your risk by taking medication daily as prescribed, monitor carbohydrates- foods that are high in  carbohydrates are the following rice, potatoes, breads, sugars, and pastas.  Reduction in the intake (eating) will assist in lowering your blood sugars. Exercise daily at least 30 minutes daily.  -     Glucose (CBG) -     HgB A1c 10.4  -     metFORMIN (GLUCOPHAGE) 1000 MG tablet; Take 1 tablet (1,000 mg total) by mouth 2 (two) times daily with a meal.  Essential hypertension Counseled on blood pressure goal of less than 130/80, Goal meet low-sodium, DASH diet, medication compliance, 150 minutes of moderate intensity exercise per week. Discussed medication compliance, adverse effects.   Dyslipidemia with low high density lipoprotein (HDL) cholesterol with hypertriglyceridemia due to type 2 diabetes mellitus ([place]) Work on eating a low fat, heart healthy diet and participate in regular aerobic exercise program to control as well. Exercise at least  30 minutes per day-5 days per week. Avoid red meat. No fried foods. No junk foods, sodas, sugary foods or drinks, unhealthy snacking, alcohol or smoking.  -     atorvastatin (LIPITOR) 40 MG tablet; Take 1 tablet (40 mg total) by mouth daily.  Cough Follow up maybe an ACE intolerance or seasonal humidity and elevated high tempeture if continues consider d/c lisinopril  Other orders -     lisinopril (ZESTRIL) 10 MG tablet; Take 1 tablet (10 mg total) by mouth daily. -     Dulaglutide 0.75 MG/0.5ML SOPN; INJECT 0.75 MG INTO THE SKIN ONCE A WEEK -     insulin lispro (HUMALOG) 100 UNIT/ML injection; Inject 0.12 mLs (12 Units total) into the skin 3 (three) times daily with meals. Tome insulina si 201-250 dan 4 unidades, 251-300 dan 6 unidades, 301-350 dan 8 unidades, 351-400 dan 10 unidades, > 400 dan 12 unidades y llame al M.D.  Protocolo de hipoglucemia discutido.     Outpatient Encounter Medications as of 08/18/2020  Medication Sig   insulin lispro (HUMALOG) 100 UNIT/ML injection Inject 0.12 mLs (12 Units total) into the skin 3 (three) times daily with  meals. Tome insulina si 201-250 dan 4 unidades, 251-300 dan 6 unidades, 301-350 dan 8 unidades, 351-400 dan 10 unidades, > 400 dan 12 unidades y llame al M.D.  Protocolo de hipoglucemia discutido.   atorvastatin (LIPITOR) 40 MG tablet Take 1 tablet (40 mg total) by mouth daily.   Blood Glucose Monitoring Suppl (ONE TOUCH ULTRA MINI) W/DEVICE KIT 1 each by Does not apply route daily. Test fasting daily   Blood Glucose Monitoring Suppl (TRUE METRIX METER) w/Device KIT Use as instructed. Check blood  glucose level by fingerstick three times per day.   CETIRIZINE HCL PO Take by mouth daily.   Dulaglutide 0.75 MG/0.5ML SOPN INJECT 0.75 MG INTO THE SKIN ONCE A WEEK   glipiZIDE (GLUCOTROL) 5 MG tablet Take 2 tablets (10 mg total) by mouth 2 (two) times daily before a meal.   glipiZIDE (GLUCOTROL) 5 MG tablet TAKE 2 TABLETS (10 MG TOTAL) BY MOUTH 2 (TWO) TIMES DAILY BEFORE A MEAL.   glucose blood (TRUE METRIX BLOOD GLUCOSE TEST) test strip Use as instructed. Check blood glucose level by fingerstick three times per day.   Insulin Pen Needle (B-D UF III MINI PEN NEEDLES) 31G X 5 MM MISC Use as instructed. Inject into the skin 4 times daily   lisinopril (ZESTRIL) 10 MG tablet Take 1 tablet (10 mg total) by mouth daily.   meloxicam (MOBIC) 7.5 MG tablet Take 1 tablet (7.5 mg total) by mouth daily.   metFORMIN (GLUCOPHAGE) 1000 MG tablet Take 1 tablet (1,000 mg total) by mouth 2 (two) times daily with a meal.   methocarbamol (ROBAXIN) 500 MG tablet TAKE 1 TABLET (500 MG TOTAL) BY MOUTH EVERY 8 (EIGHT) HOURS AS NEEDED FOR MUSCLE SPASMS.   terazosin (HYTRIN) 1 MG capsule Take 1 capsule (1 mg total) by mouth daily for 90 doses.   terazosin (HYTRIN) 1 MG capsule TAKE 1 CAPSULE (1 MG TOTAL) BY MOUTH DIARRIAMENTE   TRUEplus Lancets 28G MISC Use as instructed. Check blood glucose level by fingerstick three times per day.   [DISCONTINUED] atorvastatin (LIPITOR) 40 MG tablet Take 1 tablet (40 mg total) by mouth daily.    [DISCONTINUED] atorvastatin (LIPITOR) 40 MG tablet TAKE 1 TABLET (40 MG TOTAL) BY MOUTH DAILY.   [DISCONTINUED] Dulaglutide 0.75 MG/0.5ML SOPN INJECT 0.75 MG INTO THE SKIN ONCE A WEEK   [DISCONTINUED] insulin lispro (HUMALOG KWIKPEN) 100 UNIT/ML KwikPen Inject 20 Units into the skin 3 (three) times daily.   [DISCONTINUED] lisinopril (ZESTRIL) 10 MG tablet TAKE 1 TABLET BY MOUTH DAILY   [DISCONTINUED] lisinopril (ZESTRIL) 5 MG tablet Take 1 tablet (5 mg total) by mouth daily.   [DISCONTINUED] metFORMIN (GLUCOPHAGE) 1000 MG tablet Take 1 tablet (1,000 mg total) by mouth 2 (two) times daily with a meal.   [DISCONTINUED] metFORMIN (GLUCOPHAGE) 1000 MG tablet TAKE 1 TABLET (1,000 MG TOTAL) BY MOUTH 2 (TWO) TIMES DAILY WITH A MEAL.   No facility-administered encounter medications on file as of 08/18/2020.    Follow-up: No follow-ups on file.   Kerin Perna, NP

## 2020-08-29 ENCOUNTER — Other Ambulatory Visit: Payer: Self-pay | Admitting: Pharmacist

## 2020-08-29 ENCOUNTER — Other Ambulatory Visit (INDEPENDENT_AMBULATORY_CARE_PROVIDER_SITE_OTHER): Payer: Self-pay | Admitting: *Deleted

## 2020-08-29 ENCOUNTER — Other Ambulatory Visit: Payer: Self-pay

## 2020-08-29 DIAGNOSIS — E1165 Type 2 diabetes mellitus with hyperglycemia: Secondary | ICD-10-CM

## 2020-08-29 DIAGNOSIS — Z794 Long term (current) use of insulin: Secondary | ICD-10-CM

## 2020-08-29 MED ORDER — "INSULIN SYRINGE-NEEDLE U-100 31G X 5/16"" 0.3 ML MISC"
2 refills | Status: DC
  Filled 2020-08-29: qty 100, 33d supply, fill #0
  Filled 2020-09-21 – 2021-07-07 (×2): qty 100, 30d supply, fill #0

## 2020-08-29 MED ORDER — TRUEPLUS PEN NEEDLES 31G X 5 MM MISC
3 refills | Status: DC
  Filled 2020-08-29 – 2021-07-07 (×3): qty 100, 25d supply, fill #0

## 2020-09-06 ENCOUNTER — Other Ambulatory Visit: Payer: Self-pay

## 2020-09-21 ENCOUNTER — Other Ambulatory Visit: Payer: Self-pay

## 2020-10-26 ENCOUNTER — Other Ambulatory Visit: Payer: Self-pay

## 2020-11-21 ENCOUNTER — Ambulatory Visit (INDEPENDENT_AMBULATORY_CARE_PROVIDER_SITE_OTHER): Payer: Self-pay | Admitting: Primary Care

## 2020-11-21 ENCOUNTER — Other Ambulatory Visit: Payer: Self-pay

## 2020-11-21 ENCOUNTER — Encounter (INDEPENDENT_AMBULATORY_CARE_PROVIDER_SITE_OTHER): Payer: Self-pay | Admitting: Primary Care

## 2020-11-21 VITALS — BP 112/76 | HR 92 | Temp 98.1°F | Ht 65.0 in | Wt 146.8 lb

## 2020-11-21 DIAGNOSIS — E1165 Type 2 diabetes mellitus with hyperglycemia: Secondary | ICD-10-CM

## 2020-11-21 DIAGNOSIS — M5442 Lumbago with sciatica, left side: Secondary | ICD-10-CM

## 2020-11-21 DIAGNOSIS — I10 Essential (primary) hypertension: Secondary | ICD-10-CM

## 2020-11-21 DIAGNOSIS — Z760 Encounter for issue of repeat prescription: Secondary | ICD-10-CM

## 2020-11-21 DIAGNOSIS — G8929 Other chronic pain: Secondary | ICD-10-CM

## 2020-11-21 DIAGNOSIS — Z794 Long term (current) use of insulin: Secondary | ICD-10-CM

## 2020-11-21 LAB — POCT URINALYSIS DIP (CLINITEK)
Bilirubin, UA: NEGATIVE
Glucose, UA: >=1000 mg/dL — AB
Ketones, POC UA: NEGATIVE mg/dL
Leukocytes, UA: NEGATIVE
Nitrite, UA: NEGATIVE
POC PROTEIN,UA: 30 — AB
Spec Grav, UA: <=1.005 — AB (ref 1.010–1.025)
Urobilinogen, UA: 0.2 E.U./dL
pH, UA: 5 (ref 5.0–8.0)

## 2020-11-21 LAB — POCT GLYCOSYLATED HEMOGLOBIN (HGB A1C): Hemoglobin A1C: 12.2 % — AB (ref 4.0–5.6)

## 2020-11-21 LAB — GLUCOSE, POCT (MANUAL RESULT ENTRY): POC Glucose: 447 mg/dl — AB (ref 70–99)

## 2020-11-21 MED ORDER — TRULICITY 1.5 MG/0.5ML ~~LOC~~ SOAJ
1.5000 mg | SUBCUTANEOUS | 4 refills | Status: DC
  Filled 2020-11-21: qty 2, 28d supply, fill #0

## 2020-11-21 MED ORDER — DULAGLUTIDE 0.75 MG/0.5ML ~~LOC~~ SOAJ
0.7500 mg | SUBCUTANEOUS | 6 refills | Status: DC
  Filled 2020-11-21: qty 4, 56d supply, fill #0

## 2020-11-21 MED ORDER — LISINOPRIL 5 MG PO TABS
5.0000 mg | ORAL_TABLET | Freq: Every day | ORAL | 1 refills | Status: DC
  Filled 2020-11-21: qty 30, 30d supply, fill #0
  Filled 2021-01-19: qty 30, 30d supply, fill #1

## 2020-11-21 MED ORDER — INSULIN LISPRO 100 UNIT/ML IJ SOLN
12.0000 [IU] | Freq: Three times a day (TID) | INTRAMUSCULAR | 3 refills | Status: DC
  Filled 2020-11-21: qty 10, 28d supply, fill #0

## 2020-11-21 MED ORDER — MELOXICAM 7.5 MG PO TABS
7.5000 mg | ORAL_TABLET | Freq: Every day | ORAL | 2 refills | Status: DC
  Filled 2020-11-21: qty 30, 30d supply, fill #0
  Filled 2021-01-02: qty 30, 30d supply, fill #1

## 2020-11-21 MED ORDER — GLIPIZIDE 10 MG PO TABS
10.0000 mg | ORAL_TABLET | Freq: Two times a day (BID) | ORAL | 1 refills | Status: DC
  Filled 2020-11-21: qty 120, 60d supply, fill #0
  Filled 2021-01-19: qty 60, 30d supply, fill #1

## 2020-11-21 MED ORDER — METFORMIN HCL 1000 MG PO TABS
1000.0000 mg | ORAL_TABLET | Freq: Two times a day (BID) | ORAL | 1 refills | Status: DC
  Filled 2020-11-21: qty 60, 30d supply, fill #0
  Filled 2021-01-02: qty 60, 30d supply, fill #1

## 2020-11-21 NOTE — Addendum Note (Signed)
Addended by: Gwinda Passe on: 11/21/2020 03:56 PM   Modules accepted: Orders

## 2020-11-21 NOTE — Patient Instructions (Signed)
Dulaglutide Injection Qu es este medicamento? La DULAGLUTIDA controla los niveles de azcar en la sangre en personas con diabetes tipo 2. Se [place] con cambios en el estilo de vida tales como dieta y ejercicio fsico. Podra disminuir el riesgo de problemas que necesiten tratamiento en el hospital. Estos problemas incluyen ataque cardiaco o accidente cerebrovascular. Este medicamento puede ser utilizado para otros usos; si tiene alguna pregunta consulte con su proveedor de atencin mdica o con su farmacutico. MARCAS COMUNES: Trulicity Qu le debo informar a mi profesional de la salud antes de tomar este medicamento? Necesitan saber si usted presenta alguno de los [employer] o situaciones: tumores endocrinos (neoplasia endcrina mltiple tipo II) o si alguien en su familiar tuvo esos tumores enfermedad ocular, problemas de la visin antecedentes de pancreatitis enfermedad renal enfermedad heptica problemas estomacales o intestinales cncer de tiroides o si alguien en su familia tuvo cncer de tiroides una reaccin alrgica o inusual a la dulaglutida, a otros medicamentos, alimentos, colorantes o conservantes si est embarazada o buscando quedar embarazada si est amamantando a un beb Cmo debo utilizar este medicamento? Este medicamento se inyecta debajo de la piel. Le ensearn cmo prepararlo y administrarlo. selo segn las instrucciones en [place]. NO presione repetidamente el inyector. Siga usndolo a menos que su proveedor de Optometrist indique dejar de [place]. Si [place] este medicamento con insulina, debe [employer] medicamento y la insulina por separado. No los mezcle. No se aplique una inyeccin al lado de la otra. Cambie (rote) los sitios de inyeccin con cada inyeccin. Este frmaco viene con INSTRUCCIONES DE USO. Pdale a su farmacutico que le indique cmo usar Coca-Cola. Lea la informacin atentamente. Hable con su farmacutico  o su proveedor de atencin mdica si tiene alguna pregunta. Es importante que deseche las agujas y las jeringas usadas en un recipiente resistente a los pinchazos. No las deseche en la basura. Si no tiene un recipiente resistente a los pinchazos, llame a su farmacutico o proveedor de atencin de la salud para obtenerlo. Su farmacutico le dar una Gua del medicamento especial (MedGuide, nombre en ingls) con cada receta y en cada ocasin que la vuelva a surtir. Asegrese de leer esta informacin cada vez cuidadosamente. Hable con su proveedor de atencin mdica sobre el uso de este medicamento en nios. Puede requerir atencin especial. Sobredosis: Pngase en contacto inmediatamente con un centro toxicolgico o una sala de urgencia si usted cree que haya tomado demasiado medicamento. ATENCIN: [employer] es solo para usted. No comparta este medicamento con nadie. Qu sucede si me olvido de una dosis? Si olvida una dosis, adminstrela lo antes posible, a menos que hayan pasado ms de 3 das. Si han pasado ms de 3 das desde el momento de administracin habitual, omita la dosis que se olvid. Administre la prxima dosis a la hora habitual. Qu puede interactuar con este medicamento? otros medicamentos para la diabetes Muchos medicamentos pueden causar Astronomer de [name]. Estos incluyen: bebidas con alcohol medicamentos antivirales para el VIH o SIDA aspirina y medicamentos tipo aspirina ciertos medicamentos para la presin arterial, enfermedad cardiaca y frecuencia cardiaca irregular cromo diurticos hormonas femeninas, tales como estrgenos o progestinas, pldoras anticonceptivas fenofibrato gemfibrozil isoniazida lanreotida hormonas masculinas o esteroides anablicos IMAO, tales como Carbex, Eldepryl, Marplan, Nardil y [place]arn[place] medicamentos para alergias, asma, resfros o tos medicamentos para la depresin, ansiedad o trastornos psicticos medicamentos para Sports coach de peso  niacina [place],  medicamentos para el dolor y la inflamacin, tales como ibuprofeno o naproxeno octreotida pasireotida pentamidina fenitona probenecid antibiticos del grupo de las quinolonas, tales como ciprofloxacino, levofloxacino y ofloxacino algunos suplementos dietticos a base de hierbas medicamentos esteroideos, tales como la prednisona o la cortisona sulfametoxasol; trimetoprima hormonas tiroideas Algunos medicamentos pueden ocultar los sntomas de advertencia de niveles bajos de azcar en la sangre (hipoglucemia). Es posible que deba monitorear ms atentamente su nivel de azcar en la sangre si est tomando uno de estos medicamentos. Estos medicamentos incluyen: betabloqueadores, que con frecuencia se usan para la presin arterial alta o problemas cardiacos (algunos ejemplos son atenolol, metoprolol y propranolol) clonidina guanetidina reserpina Puede ser que esta lista no menciona todas las posibles interacciones. Informe a su profesional de [employer]	[place] de [employer] productos a base de hierbas, medicamentos de [place] o suplementos nutritivos que est tomando. Si usted fuma, consume bebidas alcohlicas o si utiliza drogas ilegales, indqueselo tambin a su profesional de [employer]	[place]. Algunas sustancias pueden interactuar con su medicamento. A qu debo estar atento al usar Coca-Cola? Visite a su proveedor de atencin mdica para que revise regularmente su evolucin. Consulte con su proveedor de atencin mdica si tiene diarrea grave, nuseas y vmitos, o sudoracin intensa. La prdida de demasiado lquido corporal puede hacer que sea peligroso usar Coca-Cola. Se monitorizar una prueba llamada Hemoglobina A1C (A1C). Es un anlisis de sangre sencillo. Mide su control del nivel de azcar en la sangre durante los ltimos 2 a 3 meses. Se le realizar esta prueba cada 3 a 6 meses. Aprenda a revisar su nivel de azcar en la sangre. Conozca los sntomas del nivel bajo y alto de Location manager  en la sangre y cmo controlarlos. Siempre lleve con usted una fuente rpida de azcar por si tiene sntomas de nivel bajo de azcar [employer]. Algunos ejemplos incluyen caramelos duros de azcar o tabletas de glucosa. Asegrese de que otras personas sepan que usted se puede ahogar si come o bebe cuando presenta sntomas graves de [place] bajo de azcar en la sangre, como convulsiones o prdida del conocimiento. Debe obtener ayuda mdica de inmediato. Informe a su proveedor de atencin mdica si tiene niveles altos de [name]. Es posible que deba cambiar la dosis de su medicamento. Si est enfermo o hace ms ejercicio que lo habitual, es posible que necesite cambiar la dosis de su medicamento. No saltee comidas. Pregunte a su proveedor de atencin mdica si debe evitar el alcohol. Muchos productos de venta libre para la tos y el resfro contienen azcar o alcohol. Estos pueden afectar los niveles de [name]. Los inyectores nunca deben compartirse. Incluso si se cambia la aguja, al compartir se pueden contagiar virus como la hepatitis o el VIH. Use un brazalete o una cadena de identificacin mdica. Lleve consigo una tarjeta que describa su afeccin. Incluya en la tarjeta una lista de los medicamentos y las dosis que [place]. Qu efectos secundarios puedo tener al [employer] este medicamento? Efectos secundarios que debe informar a su mdico o a Barrister's clerk de la salud tan pronto como sea posible: Chief of Staff (erupcin cutnea, comezn/picazn o urticaria; hinchazn de la cara, los labios o la lengua) cambios en la visin diarrea que contina o es grave infeccin (fiebre, escalofros, tos, Social research officer, government de Investment banker, operational, Social research officer, government o dificultad para Garment/textile technologist) lesin renal (dificultad para orinar o cambios en la cantidad de orina) niveles bajos de azcar en la sangre (ansiedad; confusin; mareos; aumento del apetito;  debilidad o cansancio inusuales; aumento de la sudoracin; temblores; piel fra y  sudorosa; irritabilidad; dolor de cabeza; visin borrosa; frecuencia cardiaca rpida; prdida del conocimiento) bulto o hinchazn en el cuello problemas para respirar problemas para tragar dolor o malestar estomacal inusual vmito Efectos secundarios que generalmente no requieren atencin mdica (infrmelos a su mdico o a su profesional de la salud si persisten o si son molestos): falta o prdida del apetito nuseas dolor, enrojecimiento o Marketing executive de la inyeccin Puede ser que esta lista no menciona todos los posibles efectos secundarios. Comunquese a su mdico por asesoramiento mdico [employer]. Usted puede informar los efectos secundarios a la FDA por telfono al 1-800-FDA-1088. Dnde debo guardar mi medicina? Mantenga fuera del alcance de nios y Neurosurgeon. Refrigeracin (preferido): Guarde los inyectores sin abrir en el refrigerador a una temperatura de [place] 2 y 8 grados Celsius (entre 36 y 46 grados Fahrenheit). Mantenga en el envase original hasta que est listo para usarlo. No congele ni utilice si el medicamento ha [employer] Lee. Proteja de Statistician. Deseche todo el medicamento que no haya utilizado despus de la fecha de vencimiento en la etiqueta. Temperatura ambiente: Probation officer se puede guardar a Marketing executive ambiente inferior a 30 grados Celsius (86 grados Fahrenheit) por hasta un total de [address], si fuera necesario. Proteja de Statistician. Evite exponer al calor extremo. Si se almacena a Marketing executive ambiente, deseche todo el medicamento sin usar despus de [address] o despus de la fecha de vencimiento, lo que suceda primero. Para desechar el medicamento que ya no necesite o que est vencido: Lleve el medicamento a un programa de recuperacin de medicamentos. Consulte con su farmacia o con una entidad reguladora para encontrar un lugar donde llevarlo. Si no puede Government social research officer, pregntele a Film/video editor o proveedor de atencin mdica cmo  desecharlo de IT consultant. ATENCIN: Este folleto es un resumen. Puede ser que no cubra toda la posible informacin. Si usted tiene preguntas acerca de esta medicina, consulte con su mdico, su farmacutico o su profesional de Radiographer, therapeutic.  2022 Elsevier/Gold Standard (2020-05-30 00:00:00)

## 2020-11-21 NOTE — Progress Notes (Addendum)
Subjective:  Patient ID: Joseph Roberts, male    DOB: 1960/09/13  Age: 60 y.o. MRN: [id number]  CC: Diabetes   HPI [name]  is a 60 year old Hispanic male Caren Griffins interpreter [id number])presents for follow-up of diabetes. Patient does not check blood sugar at home. Out of DM medication since Saturday. Explain what A1C measure - either diet uncontrol and noncompliant with medication  Compliant with meds - No Checking CBGs? No  Fasting avg -   Postprandial average -  Exercising regularly? - Yes Watching carbohydrate intake? - No Neuropathy ? - No Hypoglycemic events - No  - Recovers with :   Pertinent ROS:  Polyuria - Yes  Polydipsia - No Vision problems - No  Medications as noted below. Taking them regularly without complication/adverse reaction being reported today.   History Ebert has a past medical history of Diabetes mellitus (2011), High triglycerides, and Hypertension.   He has a past surgical history that includes Colonoscopy (N/A, 10/24/2013).   His family history includes Diabetes in his brother, mother, and sister.He reports that he has been smoking cigarettes. He has a 15.00 pack-year smoking history. He has never used smokeless tobacco. He reports that he does not drink alcohol and does not use drugs.  Current Outpatient Medications on File Prior to Visit  Medication Sig Dispense Refill   atorvastatin (LIPITOR) 40 MG tablet Take 1 tablet (40 mg total) by mouth daily. 90 tablet 1   Blood Glucose Monitoring Suppl (ONE TOUCH ULTRA MINI) W/DEVICE KIT 1 each by Does not apply route daily. Test fasting daily     Blood Glucose Monitoring Suppl (TRUE METRIX METER) w/Device KIT Use as instructed. Check blood glucose level by fingerstick three times per day. 1 kit 0   CETIRIZINE HCL PO Take by mouth daily.     Dulaglutide 0.75 MG/0.5ML SOPN INJECT 0.75 MG INTO THE SKIN ONCE A WEEK 4 mL 6   glipiZIDE (GLUCOTROL) 5 MG tablet Take 2 tablets (10 mg total) by  mouth 2 (two) times daily before a meal. 360 tablet 1   glipiZIDE (GLUCOTROL) 5 MG tablet TAKE 2 TABLETS (10 MG TOTAL) BY MOUTH 2 (TWO) TIMES DAILY BEFORE A MEAL. 360 tablet 1   glucose blood (TRUE METRIX BLOOD GLUCOSE TEST) test strip Use as instructed. Check blood glucose level by fingerstick three times per day. 100 each 12   insulin lispro (HUMALOG) 100 UNIT/ML injection Inject 0.12 mLs (12 Units total) into the skin 3 (three) times daily with meals. Tome insulina si 201-250 dan 4 unidades, 251-300 dan 6 unidades, 301-350 dan 8 unidades, 351-400 dan 10 unidades, > 400 dan 12 unidades y llame al M.D.  Protocolo de hipoglucemia discutido. 10 mL 3   Insulin Pen Needle (TRUEPLUS PEN NEEDLES) 31G X 5 MM MISC Use as directed to inject insuin into the skin 4 times daily. 200 each 3   Insulin Syringe-Needle U-100 (TRUEPLUS INSULIN SYRINGE) 31G X 5/16" 0.3 ML MISC Use to inject Humalog TID. 100 each 2   lisinopril (ZESTRIL) 10 MG tablet Take 1 tablet (10 mg total) by mouth daily. 90 tablet 1   meloxicam (MOBIC) 7.5 MG tablet Take 1 tablet (7.5 mg total) by mouth daily. 30 tablet 2   metFORMIN (GLUCOPHAGE) 1000 MG tablet Take 1 tablet (1,000 mg total) by mouth 2 (two) times daily with a meal. 180 tablet 1   methocarbamol (ROBAXIN) 500 MG tablet TAKE 1 TABLET (500 MG TOTAL) BY MOUTH EVERY 8 (EIGHT) HOURS AS NEEDED  FOR MUSCLE SPASMS. 60 tablet 2   terazosin (HYTRIN) 1 MG capsule Take 1 capsule (1 mg total) by mouth daily for 90 doses. 90 capsule 1   terazosin (HYTRIN) 1 MG capsule TAKE 1 CAPSULE (1 MG TOTAL) BY MOUTH DIARRIAMENTE 90 capsule 1   TRUEplus Lancets 28G MISC Use as instructed. Check blood glucose level by fingerstick three times per day. 100 each 3   No current facility-administered medications on file prior to visit.    ROS Comprehensive ROS pertinent positive and negative noted in HPI  Objective:  BP 112/76 (BP Location: Right Arm, Patient Position: Sitting, Cuff Size: Normal)   Pulse  92   Temp 98.1 F (36.7 C) (Temporal)   Ht '5\' 5"'  (1.651 m)   Wt 146 lb 12.8 oz (66.6 kg)   SpO2 95%   BMI 24.43 kg/m   BP Readings from Last 3 Encounters:  11/21/20 112/76  08/18/20 121/77  12/01/19 109/66    Wt Readings from Last 3 Encounters:  11/21/20 146 lb 12.8 oz (66.6 kg)  08/18/20 154 lb (69.9 kg)  12/01/19 163 lb (73.9 kg)    Physical Exam Vitals reviewed.  Constitutional:      Appearance: Normal appearance.  HENT:     Head: Normocephalic.     Right Ear: Tympanic membrane and external ear normal.     Left Ear: Tympanic membrane and external ear normal.     Nose: Nose normal.  Eyes:     Extraocular Movements: Extraocular movements intact.  Cardiovascular:     Rate and Rhythm: Normal rate and regular rhythm.  Pulmonary:     Effort: Pulmonary effort is normal.     Breath sounds: Normal breath sounds.  Abdominal:     General: Bowel sounds are normal. There is distension.     Palpations: Abdomen is soft.  Musculoskeletal:        General: Normal range of motion.     Cervical back: Normal range of motion.  Skin:    General: Skin is warm and dry.  Neurological:     Mental Status: He is alert and oriented to person, place, and time.  Psychiatric:        Mood and Affect: Mood normal.        Behavior: Behavior normal.        Thought Content: Thought content normal.   Lab Results  Component Value Date   HGBA1C 10.4 (A) 08/18/2020   HGBA1C 8.8 (H) 11/17/2019   HGBA1C 6.8 (H) 05/26/2019    Lab Results  Component Value Date   WBC 10.5 05/26/2019   HGB 17.2 05/26/2019   HCT 48.8 05/26/2019   PLT 289 05/26/2019   GLUCOSE 161 (H) 11/17/2019   CHOL 106 11/17/2019   TRIG 135 11/17/2019   HDL 32 (L) 11/17/2019   LDLCALC 50 11/17/2019   ALT 16 07/17/2019   AST 15 07/17/2019   NA 136 11/17/2019   K 4.5 11/17/2019   CL 102 11/17/2019   CREATININE 1.04 11/17/2019   BUN 17 11/17/2019   CO2 21 11/17/2019   TSH 1.976 04/06/2013   HGBA1C 10.4 (A) 08/18/2020    MICROALBUR 34.9 10/17/2015     Assessment & Plan:   Dewight was seen today for diabetes.  Diagnoses and all orders for this visit:  Type 2 diabetes mellitus with hyperglycemia, with long-term current use of insulin (HCC) -     HgB A1c 12.2 -     Glucose (CBG) Discussed  co- morbidities with  uncontrol diabetes  Complications -diabetic retinopathy, (close your eyes ? What do you see nothing) nephropathy decrease in kidney function- can lead to dialysis-on a machine 3 days a week to filter your kidney, neuropathy- numbness and tinging in your hands and feet,  increase risk of heart attack and stroke, and amputation due to decrease wound healing and circulation. Decrease your risk by taking medication daily as prescribed, monitor carbohydrates- foods that are high in carbohydrates are the following rice, potatoes, breads, sugars, and pastas.  Reduction in the intake (eating) will assist in lowering your blood sugars. Exercise daily at least 30 minutes daily.   Essential hypertension Blood pressure is at goal of less than 130/80, low-sodium, DASH diet, medication compliance, 150 minutes of moderate intensity exercise per week. Discussed medication compliance, adverse effects.   Medication refill  Chronic bilateral low back pain with left-sided sciatica Ray[place] was seen today for diabetes.  Diagnoses and all orders for this visit:  Type 2 diabetes mellitus with hyperglycemia, with long-term current use of insulin (HCC) -     HgB A1c -     Glucose (CBG) -     POCT URINALYSIS DIP (CLINITEK) -     metFORMIN (GLUCOPHAGE) 1000 MG tablet; Take 1 tablet (1,000 mg total) by mouth 2 (two) times daily with a meal. -     Dulaglutide (TRULICITY) 1.5 GT/3.6IW SOPN; Inject 1.5 mg into the skin once a week.  Essential hypertension  Medication refill  Chronic bilateral low back pain with left-sided sciatica -     meloxicam (MOBIC) 7.5 MG tablet; Take 1 tablet (7.5 mg total) by mouth daily.  Other  orders -     Discontinue: Dulaglutide 0.75 MG/0.5ML SOPN; INJECT 0.75 MG INTO THE SKIN ONCE A WEEK -     glipiZIDE (GLUCOTROL) 10 MG tablet; Take 1 tablet (10 mg total) by mouth 2 (two) times daily before a meal. -     insulin lispro (HUMALOG) 100 UNIT/ML injection; Inject 0.12 mLs (12 Units total) into the skin 3 (three) times daily with meals. Tome insulina si 201-250 dan 4 unidades, 251-300 dan 6 unidades, 301-350 dan 8 unidades, 351-400 dan 10 unidades, > 400 dan 12 unidades y llame al M.D.  Protocolo de hipoglucemia discutido. -     lisinopril (ZESTRIL) 5 MG tablet; Take 1 tablet (5 mg total) by mouth daily.   I am having Chancy Hurter maintain his ONE TOUCH ULTRA MINI, CETIRIZINE HCL PO, True Metrix Meter, terazosin, glipiZIDE, True Metrix Blood Glucose Test, TRUEplus Lancets 28G, meloxicam, lisinopril, methocarbamol, glipiZIDE, terazosin, atorvastatin, Dulaglutide, metFORMIN, insulin lispro, TRUEplus Pen Needles, and Insulin Syringe-Needle U-100.  No orders of the defined types were placed in this encounter.    Follow-up:   No follow-ups on file.  The above assessment and management plan was discussed with the patient. The patient verbalized understanding of and has agreed to the management plan. Patient is aware to call the clinic if symptoms fail to improve or worsen. Patient is aware when to return to the clinic for a follow-up visit. Patient educated on when it is appropriate to go to the emergency department.   Juluis Mire, NP-C

## 2020-12-19 ENCOUNTER — Other Ambulatory Visit (INDEPENDENT_AMBULATORY_CARE_PROVIDER_SITE_OTHER): Payer: Self-pay | Admitting: Primary Care

## 2020-12-19 ENCOUNTER — Telehealth: Payer: Self-pay | Admitting: Pharmacist

## 2020-12-19 ENCOUNTER — Other Ambulatory Visit: Payer: Self-pay

## 2020-12-19 ENCOUNTER — Ambulatory Visit: Payer: Self-pay | Attending: Family Medicine | Admitting: Pharmacist

## 2020-12-19 ENCOUNTER — Encounter: Payer: Self-pay | Admitting: Pharmacist

## 2020-12-19 DIAGNOSIS — E1165 Type 2 diabetes mellitus with hyperglycemia: Secondary | ICD-10-CM

## 2020-12-19 DIAGNOSIS — K648 Other hemorrhoids: Secondary | ICD-10-CM

## 2020-12-19 DIAGNOSIS — Z794 Long term (current) use of insulin: Secondary | ICD-10-CM

## 2020-12-19 DIAGNOSIS — K921 Melena: Secondary | ICD-10-CM

## 2020-12-19 MED ORDER — LANTUS SOLOSTAR 100 UNIT/ML ~~LOC~~ SOPN
10.0000 [IU] | PEN_INJECTOR | Freq: Every day | SUBCUTANEOUS | 2 refills | Status: DC
  Filled 2020-12-19: qty 3, 30d supply, fill #0
  Filled 2021-01-19: qty 3, 30d supply, fill #1

## 2020-12-19 NOTE — Telephone Encounter (Signed)
Saw pt today and he endorses hematochezia, constipation. Of note, he a colonoscopy in 2015 that did reveal small internal hemorrhoids but no findings concerning for malignancy. In addition, he has a hx of elevated PSA and urinary hesitancy. He does not have insurance but I got him scheduled with Mikle Bosworth for next week. Can you submit GI and Urology referrals for him?

## 2020-12-19 NOTE — Progress Notes (Signed)
    S:     No chief complaint on file.   Patient arrives in good spirits.  Presents for diabetes evaluation, education, and management. Patient was referred and last seen by Primary Care Provider on 11/21/2020.    Today, he has multiple complaints. Endorses constipation and hematochezia that started ~1 month ago. Denies any abdominal pain or cramping. He has not tried anything over the counter for relief. Reports that he sees blood when he wipes. Also endorses urinary hesitancy that has been occurring for >1 year. Has had Urology referrals submitted in the past that were denied due to lack of insurance. Last PSA 07/17/2019 6.9 ng/ml. He does not wish to discuss diabetes today because of these concerns.   Family/Social History:  -Fhx: DM  -Tobacco: 0.5 PPD every day smoker -Alcohol: denies use   Insurance coverage/medication affordability: none  Medication adherence reported.  Current diabetes medications include: glipizide 10 mg BID, metformin 1000 mg BID, Trulicity 1.5 mg weekly, Humalog sliding scale  Patient denies hypoglycemic events.  Patient reported dietary habits:  - Claims to follow a diabetic diet - Limits carbs and sugar-sweetened beverages   Patient-reported exercise habits: none   Patient reports nocturia (nighttime urination).  Patient denies neuropathy (nerve pain). Patient denies visual changes. Patient reports self foot exams.     O:  Physical Exam  ROS  Lab Results  Component Value Date   HGBA1C 12.2 (A) 11/21/2020   There were no vitals filed for this visit.  Lipid Panel     Component Value Date/Time   CHOL 106 11/17/2019 0840   TRIG 135 11/17/2019 0840   HDL 32 (L) 11/17/2019 0840   CHOLHDL 3.3 11/17/2019 0840   CHOLHDL 4.7 03/07/2016 1046   VLDL 61 (H) 03/07/2016 1046   LDLCALC 50 11/17/2019 0840    Home fasting blood sugars: reports that they are low but no ranges given. No meter with him today.   Clinical Atherosclerotic Cardiovascular  Disease (ASCVD): No  The ASCVD Risk score (Arnett DK, et al., 2019) failed to calculate for the following reasons:   The valid total cholesterol range is 130 to 320 mg/dL    A/P: Diabetes longstanding currently uncontrolled. Patient is able to verbalize appropriate hypoglycemia management plan. Medication adherence appears appropriate. He does not have insurance but can establish with our pharmacy for patient assistance. Will d/c sliding scale and start Lantus. Will message PCP about other concerns. Pt encouraged to make an orange card appointment with Mikle Bosworth so we can get referrals submitted for him.  -Discontinued sliding scale insulin. -Start Lantus 10 units daily.  -Continue metformin and glipizide doses for now.  -Extensively discussed pathophysiology of diabetes, recommended lifestyle interventions, dietary effects on blood sugar control -Counseled on s/sx of and management of hypoglycemia -Next A1C anticipated 02/2021.    Written patient instructions provided.  Total time in face to face counseling 30 minutes.   Follow up Pharmacist Clinic Visit in 1 month.   Butch Penny, PharmD, Patsy Baltimore, CPP Clinical Pharmacist [facility] & [facility] [phone]

## 2020-12-27 ENCOUNTER — Ambulatory Visit: Payer: Self-pay

## 2021-01-02 ENCOUNTER — Other Ambulatory Visit: Payer: Self-pay

## 2021-01-02 ENCOUNTER — Other Ambulatory Visit (INDEPENDENT_AMBULATORY_CARE_PROVIDER_SITE_OTHER): Payer: Self-pay | Admitting: Primary Care

## 2021-01-02 DIAGNOSIS — E1165 Type 2 diabetes mellitus with hyperglycemia: Secondary | ICD-10-CM

## 2021-01-02 DIAGNOSIS — Z794 Long term (current) use of insulin: Secondary | ICD-10-CM

## 2021-01-02 MED ORDER — TRULICITY 1.5 MG/0.5ML ~~LOC~~ SOAJ
1.5000 mg | SUBCUTANEOUS | 4 refills | Status: DC
  Filled 2021-01-02: qty 2, 28d supply, fill #0

## 2021-01-04 ENCOUNTER — Other Ambulatory Visit: Payer: Self-pay

## 2021-01-19 ENCOUNTER — Other Ambulatory Visit: Payer: Self-pay

## 2021-01-19 ENCOUNTER — Ambulatory Visit: Payer: Self-pay | Attending: Nurse Practitioner | Admitting: Pharmacist

## 2021-01-19 ENCOUNTER — Other Ambulatory Visit: Payer: Self-pay | Admitting: Pharmacist

## 2021-01-19 DIAGNOSIS — E785 Hyperlipidemia, unspecified: Secondary | ICD-10-CM

## 2021-01-19 DIAGNOSIS — Z794 Long term (current) use of insulin: Secondary | ICD-10-CM

## 2021-01-19 DIAGNOSIS — E1165 Type 2 diabetes mellitus with hyperglycemia: Secondary | ICD-10-CM

## 2021-01-19 MED ORDER — TRULICITY 3 MG/0.5ML ~~LOC~~ SOAJ
3.0000 mg | SUBCUTANEOUS | 2 refills | Status: DC
  Filled 2021-01-19: qty 2, 28d supply, fill #0

## 2021-01-19 MED ORDER — METFORMIN HCL ER 500 MG PO TB24
1000.0000 mg | ORAL_TABLET | Freq: Two times a day (BID) | ORAL | 2 refills | Status: DC
  Filled 2021-01-19: qty 120, 30d supply, fill #0

## 2021-01-19 NOTE — Progress Notes (Signed)
    S:    PCP: Zelda   Patient arrives in good spirits.  Presents for diabetes evaluation, education, and management. Patient was referred and last seen by Primary Care Provider Bertram [place] on 11/21/2020. Patient was last seen by CPP on 12/19/2020 at which time Lantus was started, sliding scale insulin was stopped, and patient was referred to [facility] for orange card.   Patient reports Diabetes was diagnosed 10 years ago.   Family/Social History:  -Current smoker -Mother, sister, and brother have diabetes  Insurance coverage/medication affordability: self pay  Medication adherence reported missing only one day per week .   Current diabetes medications include:  -Trulicity 1.5 mg weekly -Lantus 10 units daily -Metformin 1000 mg twice daily -Glipizide 10 mg twice daily  Current hypertension medications include:  -Lisinopril 5 mg daily  Current hyperlipidemia medications include:  -Atorvastatin 40 mg daily  Patient denies hypoglycemic events.  Patient reported dietary habits: Eats 3 meals/day Breakfast:milk, sandwih, toast Lunch:Soup Dinner:Cereal with milk Drinks:water, juice, occasional soda  Diet overall seems very poor and rich in carbohydrates. Patient was given counseling on proper diet.  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  Patient denies neuropathy (nerve pain). Patient reports some visual changes of blurred vision that has lessened Patient reports self foot exams.     O:   Lab Results  Component Value Date   HGBA1C 12.2 (A) 11/21/2020   There were no vitals filed for this visit.  Lipid Panel     Component Value Date/Time   CHOL 106 11/17/2019 0840   TRIG 135 11/17/2019 0840   HDL 32 (L) 11/17/2019 0840   CHOLHDL 3.3 11/17/2019 0840   CHOLHDL 4.7 03/07/2016 1046   VLDL 61 (H) 03/07/2016 1046   LDLCALC 50 11/17/2019 0840   POC BG In clinic today is 158. Patient  does report a late night snack, however current reading is not likely reflective.  Patient reports home readings averaging 130 with the highest reading seen of 160  Clinical Atherosclerotic Cardiovascular Disease (ASCVD): No  The ASCVD Risk score (Arnett DK, et al., 2019) failed to calculate for the following reasons:   The valid total cholesterol range is 130 to 320 mg/dL   A/P: Diabetes longstanding, currently reported by patient to be controlled, but fasting slightly elevated in clinic. Patient is able to verbalize appropriate hypoglycemia management plan. Medication adherence appears adequate.  -Increase Trulicity to 3 mg weekly to help with diet -Lantus 10 units daily -Change Metformin to 1000 mg XR twice daily -Continue Glipizide 10 mg twice daily. Patient is instructed to stop taking glipizide altogether if patient experiences symptoms of hypoglycemia. Patient voiced understanding of the plan. -Extensively discussed pathophysiology of diabetes, recommended lifestyle interventions, dietary effects on blood sugar control -Counseled on s/sx of and management of hypoglycemia -Next A1C anticipated 02/20/2021.   ASCVD risk - primary prevention in patient with diabetes. Last LDL is controlled. ASCVD risk score cannot be calculated. High intensity statin indicated -Continued atorvastatin 40 mg.  -Lipid panel and CMP 02/20/2021   Written patient instructions provided.  Total time in face to face counseling 35 minutes.  Follow up Pharmacist Clinic Visit on 02/02/2021 and PCP on 02/20/2021.    Thank you for allowing pharmacy to participate in this patient's care.  Enos Fling, PharmD PGY1 Pharmacy Resident 01/19/2021 10:56 AM

## 2021-01-20 LAB — CMP14+EGFR
ALT: 28 IU/L (ref 0–44)
AST: 17 IU/L (ref 0–40)
Albumin/Globulin Ratio: 1.4 (ref 1.2–2.2)
Albumin: 4.1 g/dL (ref 3.8–4.9)
Alkaline Phosphatase: 131 IU/L — ABNORMAL HIGH (ref 44–121)
BUN/Creatinine Ratio: 17 (ref 9–20)
BUN: 13 mg/dL (ref 6–24)
Bilirubin Total: 0.4 mg/dL (ref 0.0–1.2)
CO2: 20 mmol/L (ref 20–29)
Calcium: 9.5 mg/dL (ref 8.7–10.2)
Chloride: 105 mmol/L (ref 96–106)
Creatinine, Ser: 0.77 mg/dL (ref 0.76–1.27)
Globulin, Total: 3 g/dL (ref 1.5–4.5)
Glucose: 128 mg/dL — ABNORMAL HIGH (ref 70–99)
Potassium: 4.1 mmol/L (ref 3.5–5.2)
Sodium: 138 mmol/L (ref 134–144)
Total Protein: 7.1 g/dL (ref 6.0–8.5)
eGFR: 103 mL/min/{1.73_m2} (ref >59)

## 2021-01-20 LAB — LIPID PANEL
Chol/HDL Ratio: 2.9 ratio (ref 0.0–5.0)
Cholesterol, Total: 100 mg/dL (ref 100–199)
HDL: 34 mg/dL — ABNORMAL LOW (ref >39)
LDL Chol Calc (NIH): 47 mg/dL (ref 0–99)
Triglycerides: 96 mg/dL (ref 0–149)
VLDL Cholesterol Cal: 19 mg/dL (ref 5–40)

## 2021-01-23 ENCOUNTER — Other Ambulatory Visit: Payer: Self-pay

## 2021-01-25 ENCOUNTER — Telehealth (INDEPENDENT_AMBULATORY_CARE_PROVIDER_SITE_OTHER): Payer: Self-pay

## 2021-01-25 ENCOUNTER — Other Ambulatory Visit: Payer: Self-pay

## 2021-01-25 NOTE — Telephone Encounter (Signed)
-----   Message from Grayce Sessions, NP sent at 01/20/2021 11:22 AM EST ----- Labs are  normal. Make sure you are drinking at least 48 oz of water per day. Work on eating a low fat, heart healthy diet and participate in regular aerobic exercise program to control as well. Exercise at least  30 minutes per day-5 days per week. Avoid red meat. No fried foods. No junk foods, sodas, sugary foods or drinks, unhealthy snacking, alcohol or smoking.

## 2021-01-25 NOTE — Telephone Encounter (Signed)
Call placed to patient with pacific interpreter [phone]) patient is aware of normal labs. Maryjean Morn, CMA

## 2021-02-02 ENCOUNTER — Ambulatory Visit: Payer: Self-pay | Attending: Nurse Practitioner | Admitting: Pharmacist

## 2021-02-02 ENCOUNTER — Other Ambulatory Visit: Payer: Self-pay

## 2021-02-02 DIAGNOSIS — E1165 Type 2 diabetes mellitus with hyperglycemia: Secondary | ICD-10-CM

## 2021-02-02 DIAGNOSIS — Z794 Long term (current) use of insulin: Secondary | ICD-10-CM

## 2021-02-02 NOTE — Progress Notes (Signed)
    S:    PCP: Joseph Roberts   Patient arrives in good spirits.  Presents for diabetes evaluation, education, and management. Patient was referred and last seen by Primary Care Provider Bertram [place] on 11/21/2020. Patient was last seen by CPP on 01/19/2021 at which time Trulicity dose was increased. We also changed metformin to XR.   Patient reports Diabetes was diagnosed 10 years ago.   Family/Social History:  -Current smoker -Mother, sister, and brother have diabetes  Insurance coverage/medication affordability: self pay  Medication adherence reported missing only one day per week .   Current diabetes medications include:  -Trulicity 3mg  weekly -Lantus 10 units daily -Metformin 500 mg XR, two tablets (1000 mg) twice daily -Glipizide 10 mg twice daily  Current hypertension medications include:  -Lisinopril 5 mg daily  Current hyperlipidemia medications include:  -Atorvastatin 40 mg daily  Patient denies hypoglycemic events.  Patient reported dietary habits: Eats 3 meals/day Breakfast:milk, sandwih, toast Lunch:Soup Dinner:Cereal with milk Drinks:water, juice, occasional soda  Diet overall seems very poor and rich in carbohydrates. Patient was given counseling on proper diet.  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  Patient denies neuropathy (nerve pain). Patient reports some visual changes of blurred vision that has lessened Patient reports self foot exams.     O:   Lab Results  Component Value Date   HGBA1C 12.2 (A) 11/21/2020   There were no vitals filed for this visit.  Lipid Panel     Component Value Date/Time   CHOL 100 01/19/2021 0921   TRIG 96 01/19/2021 0921   HDL 34 (L) 01/19/2021 0921   CHOLHDL 2.9 01/19/2021 0921   CHOLHDL 4.7 03/07/2016 1046   VLDL 61 (H) 03/07/2016 1046   LDLCALC 47 01/19/2021 0921   No meter today.  Patient reports home readings averaging 130  with the highest reading seen in the 170s. He tells me he only sees readings >150 after he eats.   Clinical Atherosclerotic Cardiovascular Disease (ASCVD): No  The ASCVD Risk score (Arnett DK, et al., 2019) failed to calculate for the following reasons:   The valid total cholesterol range is 130 to 320 mg/dL   A/P: Diabetes longstanding, currently reported by patient to be controlled, but fasting slightly elevated in clinic. Patient is able to verbalize appropriate hypoglycemia management plan. Medication adherence appears adequate.  -Continue Trulicity 3 mg weekly to help with diet -Lantus 10 units daily -Continue Metformin 1000 mg XR twice daily -Continue Glipizide 10 mg twice daily. Patient is instructed to stop taking glipizide altogether if patient experiences symptoms of hypoglycemia. Patient voiced understanding of the plan. -Extensively discussed pathophysiology of diabetes, recommended lifestyle interventions, dietary effects on blood sugar control -Counseled on s/sx of and management of hypoglycemia -Next A1C anticipated 02/20/2021.   ASCVD risk - primary prevention in patient with diabetes. Last LDL is controlled. ASCVD risk score cannot be calculated. High intensity statin indicated -Continued atorvastatin 40 mg.   Written patient instructions provided.  Total time in face to face counseling 35 minutes.  Follow up PCP clinic visit on 02/20/2021.    Thank you for allowing pharmacy to participate in this patient's care.  02/22/2021, PharmD, Butch Penny, CPP Clinical Pharmacist [facility] [phone]

## 2021-02-02 NOTE — Patient Instructions (Signed)
Thank you for coming to see me today. Please do the following:  Continue all medications.  Continue checking blood sugars at home.  Continue making the lifestyle changes we've discussed together during our visit. Diet and exercise play a significant role in improving your blood sugars.  Follow-up with Marcelino Duster next time.    Hypoglycemia or low blood sugar:   Low blood sugar can happen quickly and may become an emergency if not treated right away.   While this shouldn't happen often, it can be brought upon if you skip a meal or do not eat enough. Also, if your insulin or other diabetes medications are dosed too high, this can cause your blood sugar to go to low.   Warning signs of low blood sugar include: Feeling shaky or dizzy Feeling weak or tired  Excessive hunger Feeling anxious or upset  Sweating even when you aren't exercising  What to do if I experience low blood sugar? Check your blood sugar with your meter. If lower than 70, proceed to step 2.  Treat with 3-4 glucose tablets or 3 packets of regular sugar. If these aren't around, you can try hard candy. Yet another option would be to drink 4 ounces of fruit juice or 6 ounces of REGULAR soda.  Re-check your sugar in 15 minutes. If it is still below 70, do what you did in step 2 again. If has come back up, go ahead and eat a snack or small meal at this time.

## 2021-02-06 ENCOUNTER — Other Ambulatory Visit: Payer: Self-pay

## 2021-02-06 ENCOUNTER — Ambulatory Visit: Payer: Self-pay | Attending: Nurse Practitioner

## 2021-02-20 ENCOUNTER — Other Ambulatory Visit: Payer: Self-pay

## 2021-02-20 ENCOUNTER — Ambulatory Visit (INDEPENDENT_AMBULATORY_CARE_PROVIDER_SITE_OTHER): Payer: Self-pay | Admitting: Primary Care

## 2021-02-20 ENCOUNTER — Encounter (INDEPENDENT_AMBULATORY_CARE_PROVIDER_SITE_OTHER): Payer: Self-pay | Admitting: Primary Care

## 2021-02-20 VITALS — BP 124/83 | HR 77 | Temp 97.9°F | Ht 63.0 in | Wt 159.2 lb

## 2021-02-20 DIAGNOSIS — M238X2 Other internal derangements of left knee: Secondary | ICD-10-CM

## 2021-02-20 DIAGNOSIS — E119 Type 2 diabetes mellitus without complications: Secondary | ICD-10-CM

## 2021-02-20 DIAGNOSIS — I10 Essential (primary) hypertension: Secondary | ICD-10-CM

## 2021-02-20 DIAGNOSIS — E1169 Type 2 diabetes mellitus with other specified complication: Secondary | ICD-10-CM

## 2021-02-20 DIAGNOSIS — Z794 Long term (current) use of insulin: Secondary | ICD-10-CM

## 2021-02-20 DIAGNOSIS — E782 Mixed hyperlipidemia: Secondary | ICD-10-CM

## 2021-02-20 DIAGNOSIS — M238X1 Other internal derangements of right knee: Secondary | ICD-10-CM

## 2021-02-20 DIAGNOSIS — Z760 Encounter for issue of repeat prescription: Secondary | ICD-10-CM

## 2021-02-20 DIAGNOSIS — E1165 Type 2 diabetes mellitus with hyperglycemia: Secondary | ICD-10-CM

## 2021-02-20 LAB — POCT GLYCOSYLATED HEMOGLOBIN (HGB A1C): Hemoglobin A1C: 8.4 % — AB (ref 4.0–5.6)

## 2021-02-20 MED ORDER — BASAGLAR KWIKPEN 100 UNIT/ML ~~LOC~~ SOPN
10.0000 [IU] | PEN_INJECTOR | Freq: Every day | SUBCUTANEOUS | 2 refills | Status: DC
  Filled 2021-02-20 – 2021-03-31 (×2): qty 3, 28d supply, fill #0
  Filled 2021-05-04 (×2): qty 3, 28d supply, fill #1

## 2021-02-20 MED ORDER — LISINOPRIL 5 MG PO TABS
5.0000 mg | ORAL_TABLET | Freq: Every day | ORAL | 1 refills | Status: DC
  Filled 2021-02-20 – 2021-03-31 (×2): qty 30, 30d supply, fill #0
  Filled 2021-05-04: qty 30, 30d supply, fill #1
  Filled 2021-07-07: qty 30, 30d supply, fill #2

## 2021-02-20 MED ORDER — TRULICITY 3 MG/0.5ML ~~LOC~~ SOAJ
3.0000 mg | SUBCUTANEOUS | 2 refills | Status: DC
  Filled 2021-02-20 – 2021-03-31 (×2): qty 2, 28d supply, fill #0
  Filled 2021-05-04: qty 2, 28d supply, fill #1

## 2021-02-20 MED ORDER — ATORVASTATIN CALCIUM 40 MG PO TABS
40.0000 mg | ORAL_TABLET | Freq: Every day | ORAL | 1 refills | Status: DC
  Filled 2021-02-20 – 2021-03-31 (×2): qty 30, 30d supply, fill #0
  Filled 2021-05-04: qty 30, 30d supply, fill #1
  Filled 2021-07-07: qty 30, 30d supply, fill #2

## 2021-02-20 MED ORDER — METFORMIN HCL ER 500 MG PO TB24
1000.0000 mg | ORAL_TABLET | Freq: Two times a day (BID) | ORAL | 2 refills | Status: DC
  Filled 2021-02-20 – 2021-03-31 (×2): qty 120, 30d supply, fill #0
  Filled 2021-05-04: qty 120, 30d supply, fill #1

## 2021-02-20 MED ORDER — GLIPIZIDE 10 MG PO TABS
10.0000 mg | ORAL_TABLET | Freq: Two times a day (BID) | ORAL | 1 refills | Status: DC
  Filled 2021-02-20 – 2021-03-31 (×2): qty 60, 30d supply, fill #0
  Filled 2021-05-04: qty 60, 30d supply, fill #1

## 2021-02-20 NOTE — Patient Instructions (Signed)
Gripe en los adultos Influenza, Adult A la gripe tambin se la conoce como "influenza". Es una infeccin en los pulmones, la nariz y la garganta (vas respiratorias). Se transmite fcilmente de persona a persona (es contagiosa). La gripe causa sntomas que son como los de un resfro, junto con fiebre alta y dolores corporales. Cules son las causas? La causa de esta afeccin es el virus de la influenza. Puede contraer el virus de las siguientes maneras: Al inhalar gotitas que quedan en el aire despus de que una persona infectada con gripe tosi o estornud. Al tocar algo que est contaminado con el virus y luego llevarse la mano a la boca, la nariz o los ojos. Qu incrementa el riesgo? Hay ciertas cosas que lo pueden hacer ms propenso a tener gripe. Estas incluyen lo siguiente: No lavarse las manos con frecuencia. Tener contacto cercano con muchas personas durante la temporada de resfro y gripe. Tocarse la boca, los ojos o la nariz sin antes lavarse las manos. No recibir la vacuna antigripal todos los aos. Puede correr un mayor riesgo de tener gripe, y problemas graves, como una infeccin pulmonar (neumona), si usted: Es mayor de 65 aos de edad. Est embarazada. Tiene debilitado el sistema que combate las defensas (sistema inmunitario) debido a una enfermedad o a que toma determinados medicamentos. Tiene una afeccin a largo plazo (crnica), como las siguientes: Enfermedad cardaca, renal o pulmonar. Diabetes. Asma. Tiene un trastorno heptico. Tiene mucho sobrepeso (obesidad mrbida). Tiene anemia. Cules son los signos o sntomas? Los sntomas normalmente comienzan de repente y duran entre 4 y 14 das. Pueden incluir los siguientes: Fiebre y escalofros. Dolores de cabeza, dolores en el cuerpo o dolores musculares. Dolor de garganta. Tos. Secrecin o congestin nasal. Molestias en el pecho. No querer comer tanto como lo hace normalmente. Sensacin de debilidad o  cansancio. Mareos. Malestar estomacal o vmitos. Cmo se trata? Si la gripe se detecta de forma temprana, puede recibir tratamiento con medicamentos antivirales. Esto puede ayudar a reducir la gravedad y la duracin de la enfermedad. Se los administrarn por boca o a travs de un tubo (catter) intravenoso. Cuidarse en su hogar puede ayudar a que mejoren los sntomas. El mdico puede recomendarle lo siguiente: Tomar medicamentos de venta libre. Beber abundante lquido. La gripe suele desaparecer sola. Si tiene sntomas muy graves u otros problemas, puede recibir tratamiento en un hospital. Siga estas instrucciones en su casa:   Actividad Descanse todo lo que sea necesario. Duerma lo suficiente. Qudese en su casa y no concurra al trabajo o a la escuela, como se lo haya indicado el mdico. No salga de su casa hasta que no haya tenido fiebre por 24 horas sin tomar medicamentos. Salga de su casa solamente para ir al mdico. Comida y bebida Tome una SRO (solucin de rehidratacin oral). Es una bebida que se vende en farmacias y tiendas. Beba suficiente lquido como para mantener la orina de color amarillo plido. En la medida en que pueda, beba lquidos transparentes en pequeas cantidades. Los lquidos transparentes son, por ejemplo: Agua. Trocitos de hielo. Jugo de frutas mezclado con agua. Bebidas deportivas de bajas caloras. Coma alimentos suaves que sean fciles de digerir. En la medida que pueda, consuma pequeas cantidades. Estos alimentos incluyen: Bananas. Pur de manzana. Arroz. Carnes magras. Tostadas. Galletas. No coma ni beba lo siguiente: Lquidos con alto contenido de azcar o cafena. Alcohol. Alimentos condimentados o con alto contenido de grasa. Indicaciones generales Use los medicamentos de venta libre y los recetados solamente   como se lo haya indicado el mdico. Use un humidificador de aire fro para que el aire de su casa est ms hmedo. Esto puede facilitar  la respiracin. Cuando utilice un humidificador de vapor fro, lmpielo a diario. Vace el agua y cmbiela por agua limpia. Al toser o estornudar, cbrase la boca y la nariz. Lvese las manos frecuentemente con agua y jabn y durante al menos 20 segundos. Esto tambin es importante despus de toser o estornudar. Si no dispone de agua y jabn, use desinfectante para manos con alcohol. Cumpla con todas las visitas de seguimiento. Cmo se previene?  Colquese la vacuna antigripal todos los aos. Puede colocarse la vacuna contra la gripe a fines de verano, en otoo o en invierno. Pregntele al mdico cundo debe aplicarse la vacuna contra la gripe. Evite el contacto con personas que estn enfermas durante el otoo y el invierno. Es la temporada del resfro y la gripe. Comunquese con un mdico si: Tiene sntomas nuevos. Tiene los siguientes sntomas: Dolor de pecho. Materia fecal lquida (diarrea). Fiebre. La tos empeora. Empieza a tener ms mucosidad. Tiene malestar estomacal. Vomita. Solicite ayuda de inmediato si: Le falta el aire. Tiene dificultad para respirar. La piel o las uas se ponen de un color azulado. Presenta dolor muy intenso o rigidez en el cuello. Tiene dolor de cabeza repentino. Le duele la cara o el odo de forma repentina. No puede comer ni beber sin vomitar. Estos sntomas pueden representar un problema grave que constituye una emergencia. Solicite atencin mdica de inmediato. Comunquese con el servicio de emergencias de su localidad (911 en los [place]). No espere a ver si los sntomas desaparecen. No conduzca por sus propios medios hasta el hospital. Resumen A la gripe tambin se la conoce como "influenza". Es una infeccin en los pulmones, la nariz y la garganta. Se transmite fcilmente de una persona a otra. Use los medicamentos de venta libre y los recetados solamente como se lo haya indicado el mdico. Aplicarse la vacuna contra la gripe todos los  aos es la mejor manera de no contagiarse la gripe. Esta informacin no tiene como fin reemplazar el consejo del mdico. Asegrese de hacerle al mdico cualquier pregunta que tenga. Document Revised: 12/17/2019 Document Reviewed: 12/17/2019 Elsevier Patient Education  2022 Elsevier Inc.  
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
## 2021-03-06 ENCOUNTER — Other Ambulatory Visit: Payer: Self-pay

## 2021-03-14 ENCOUNTER — Other Ambulatory Visit: Payer: Self-pay

## 2021-03-17 ENCOUNTER — Other Ambulatory Visit: Payer: Self-pay

## 2021-03-28 ENCOUNTER — Other Ambulatory Visit: Payer: Self-pay

## 2021-03-31 ENCOUNTER — Other Ambulatory Visit: Payer: Self-pay

## 2021-04-04 ENCOUNTER — Other Ambulatory Visit: Payer: Self-pay

## 2021-04-24 ENCOUNTER — Other Ambulatory Visit: Payer: Self-pay

## 2021-05-03 ENCOUNTER — Other Ambulatory Visit: Payer: Self-pay

## 2021-05-04 ENCOUNTER — Other Ambulatory Visit: Payer: Self-pay

## 2021-05-22 ENCOUNTER — Ambulatory Visit (INDEPENDENT_AMBULATORY_CARE_PROVIDER_SITE_OTHER): Payer: Self-pay | Admitting: Primary Care

## 2021-05-31 ENCOUNTER — Other Ambulatory Visit: Payer: Self-pay

## 2021-06-08 ENCOUNTER — Encounter (INDEPENDENT_AMBULATORY_CARE_PROVIDER_SITE_OTHER): Payer: Self-pay | Admitting: Primary Care

## 2021-06-08 ENCOUNTER — Other Ambulatory Visit: Payer: Self-pay

## 2021-06-08 ENCOUNTER — Ambulatory Visit (INDEPENDENT_AMBULATORY_CARE_PROVIDER_SITE_OTHER): Payer: Self-pay | Admitting: Primary Care

## 2021-06-08 VITALS — BP 108/77 | HR 85 | Temp 98.0°F | Ht 63.0 in | Wt 152.4 lb

## 2021-06-08 DIAGNOSIS — Z794 Long term (current) use of insulin: Secondary | ICD-10-CM

## 2021-06-08 DIAGNOSIS — E1165 Type 2 diabetes mellitus with hyperglycemia: Secondary | ICD-10-CM

## 2021-06-08 DIAGNOSIS — Z760 Encounter for issue of repeat prescription: Secondary | ICD-10-CM

## 2021-06-08 LAB — POCT GLYCOSYLATED HEMOGLOBIN (HGB A1C): Hemoglobin A1C: 10.6 % — AB (ref 4.0–5.6)

## 2021-06-08 MED ORDER — TRULICITY 3 MG/0.5ML ~~LOC~~ SOAJ
3.0000 mg | SUBCUTANEOUS | 3 refills | Status: DC
  Filled 2021-06-08: qty 2, 28d supply, fill #0
  Filled 2021-07-07: qty 2, 28d supply, fill #1
  Filled 2021-08-03: qty 2, 28d supply, fill #2
  Filled 2021-08-31: qty 2, 28d supply, fill #3

## 2021-06-08 MED ORDER — GLIPIZIDE 10 MG PO TABS
10.0000 mg | ORAL_TABLET | Freq: Two times a day (BID) | ORAL | 1 refills | Status: DC
  Filled 2021-06-08: qty 60, 30d supply, fill #0
  Filled 2021-07-07: qty 60, 30d supply, fill #1

## 2021-06-08 MED ORDER — BASAGLAR KWIKPEN 100 UNIT/ML ~~LOC~~ SOPN
10.0000 [IU] | PEN_INJECTOR | Freq: Two times a day (BID) | SUBCUTANEOUS | 3 refills | Status: DC
  Filled 2021-06-08: qty 3, 15d supply, fill #0
  Filled 2021-07-07: qty 3, 15d supply, fill #1
  Filled 2021-08-03: qty 6, 30d supply, fill #2

## 2021-06-08 MED ORDER — METFORMIN HCL ER 500 MG PO TB24
1000.0000 mg | ORAL_TABLET | Freq: Two times a day (BID) | ORAL | 2 refills | Status: DC
  Filled 2021-06-08: qty 120, 30d supply, fill #0
  Filled 2021-07-07: qty 120, 30d supply, fill #1

## 2021-06-08 NOTE — Patient Instructions (Signed)
Estado hiperosmolar hipergluc?mico ?Hyperglycemic Hyperosmolar State ?El [place] hiperosmolar hipergluc?mico es una afecci?n grave que implica un aumento extremo del nivel de az?car (glucosa) en la sangre. Esto produce una deshidrataci?n extrema, lo que puede poner en peligro la vida. Esta afecci?n es resultado de la diabetes no controlada o no diagnosticada. Es m?s frecuente en las personas que tienen diabetes tipo 2 (diabetes mellitus tipo 2). ?Determinadas hormonas, llamadas insulina y glucag?n, controlan el nivel de glucosa que hay en la sangre. La insulina baja la glucemia, mientras que el glucag?n la [place]. Los niveles de glucosa que est?n muy altos (hiperglucemia) puede surgir por no tener suficiente insulina en el torrente sangu?neo o porque el cuerpo no responde de forma normal a la insulina. En condiciones normales, el cuerpo elimina el exceso de glucosa a trav?s de la [place]. Si no se bebe la cantidad suficiente de l?quido o si se beben l?quidos con az?car, el cuerpo no puede eliminar el exceso de glucosa. Esto puede provocar un estado hiperosmolar hipergluc?mico. ??Cu?les son las causas? ?Esta afecci?n puede ser causada por lo siguiente: ?Una infecci?n. ?Medicamentos que causan deshidrataci?n o p?rdida de l?quidos corporales. ?Medicamentos que modifican la forma en que el cuerpo produce insulina y procesa la glucosa. ?Ciertas enfermedades, como infarto de miocardio o accidente cerebrovascular. ?No tomar los medicamentos para la diabetes. ?Nueva aparici?n o diagn?stico de diabetes. ??Qu? incrementa el riesgo? ?Los siguientes factores pueden hacer que sea m?s propenso a Aeronautical engineer afecci?n: ?Edad avanzada. ?Control deficiente de la diabetes. ?Imposibilidad de comer o beber normalmente. ?Insuficiencia card?aca. ?Una infecci?n. ?Cirug?a. ?Enfermedad. ??Cu?les son los signos o s?ntomas? ?Los s?ntomas de esta afecci?n incluyen: ?Somnolencia o confusi?n. ?Debilidad. ?Boca seca y sed extrema o aumento de  la sed. ?Necesidad de Geographical information systems officer con mayor frecuencia que lo habitual. ?Piel seca y caliente que no transpira incluso con altas temperaturas. ?Fiebre alta. ?Problemas en la visi?n o p?rdida de la visi?n. ?Ver, o?r, percibir sabores, percibir olores o sentir cosas que no son reales (alucinaciones). ??C?mo se diagnostica? ?Esta afecci?n se diagnostica en funci?n de sus antecedentes m?dicos, sus s?ntomas y un an?lisis de sangre para medirle el nivel de glucemia. ??C?mo se trata? ?Esta afecci?n se trata en el hospital. Los objetivos del tratamiento son los siguientes: ?Corregir la deshidrataci?n reponiendo los l?quidos perdidos. Los l?quidos se administrar?n a trav?s de una v?a intravenosa. ?Mejorar los [employer] de az?car en la sangre mediante el uso de [place] u otros medicamentos, seg?n sea necesario. ?Tratar la causa de la hiperglucemia, por ejemplo, una infecci?n, una enfermedad o diabetes reci?n diagnosticada. ?Siga estas instrucciones en su casa: ?Instrucciones generales ?Use los medicamentos de venta libre y los recetados solamente como se lo haya indicado el m?dico. ?Contr?lese la glucemia con la frecuencia que le haya indicado el m?dico. Comun?quese con el m?dico si la glucemia es de 300 a 400 mg/dl o m?s, especialmente si se mantiene as? durante m?s de algunas horas. ?No consuma ning?n producto que contenga nicotina o tabaco. Estos productos incluyen cigarrillos, tabaco para mascar y aparatos de vapeo, como los cigarrillos electr?nicos. Si necesita ayuda para dejar de fumar, consulte al m?dico. ?Si bebe alcohol: ?Limite la cantidad que bebe a lo siguiente: ?De 0 a 1 medida por d?a para las mujeres que no est?n embarazadas. ?De 0 a 2 medidas por d?a para los hombres. ?Sepa cu?nta cantidad de alcohol hay en las bebidas. En los [address], una medida equivale a una botella de cerveza de 12 oz (355 ml), un vaso de vino de 5 oz (  148 ml) o un vaso de una bebida alcoh?lica de alta graduaci?n de 1? oz (44  ml). ?Aprenda a manejar el estr?s. Si necesita ayuda para lograrlo, consulte a su m?dico. ?Haga actividad f?sica habitualmente como se lo haya indicado el m?dico. ?Cumpla con todas las visitas de seguimiento. Esto es importante. ?Comida y bebida ?Mantenga un peso saludable. ?Mant?ngase hidratado, especialmente si hace actividad f?sica, si est? enfermo o si la temperatura es alta. ?Beba suficiente l?quido como para mantener la orina de color amarillo p?lido. ?Si usted tiene diabetes: ?  ?Conozca los signos y los s?ntomas de hiperglucemia, que incluyen aumento de la sed, Geographical information systems officer con m?s frecuencia de lo habitual y problemas visuales o p?rdida de la visi?n. ?Siga el plan de control de la diabetes como se lo haya indicado el m?dico. Aseg?rese de hacer lo siguiente: ?Apl?quese la insulina y tome los medicamentos como se lo hayan indicado. ?Siga su plan de ejercicio. ?Siga su plan de comidas. Coma a horario y no se saltee ninguna comida. ?Controle su nivel de glucemia con la frecuencia que le hayan indicado. Contr?lese la glucemia antes y despu?s de hacer actividad f?sica. Si hace ejercicio durante m?s tiempo o de [employer] de lo habitual, contr?lese la glucemia con mayor frecuencia. ?Siga el plan para los d?as de enfermedad cuando no pueda comer ni beber normalmente. [place] este plan por adelantado con el m?dico. ?Comparta su plan de control de la diabetes con sus compa?eros de trabajo y de Production designer, theatre/television/film, y con las personas con las que [place]. ?Contr?lese las cetonas en la orina cuando est? enfermo y con la frecuencia que le haya indicado el m?dico. ?Lleve una tarjeta de alerta m?dica o use un brazalete o medalla de alerta m?dica. ?Comun?quese con un m?dico si: ?El nivel de glucemia se mantiene entre 300 y 400 mg/dl. ?Presenta fiebre. ?No puede comer ni beber. ?Sigue teniendo s?ntomas de hiperglucemia. ?Solicite ayuda de inmediato si: ?El monitor de glucemia indica un nivel ?alto? incluso cuando est? tomando  medicamentos o se est? administrando insulina. ?No puede permanecer despierto. ?Se siente confundido. ?Siente debilidad. ?Est? muy deshidratado. Los s?ntomas de deshidrataci?n intensa incluyen lo siguiente: ?Mucha sed. ?Sequedad en la boca. ?Respiraci?n r?pida. ?Presenta alg?n otro s?ntoma de estado hiperosmolar hipergluc?mico. ?Estos s?ntomas pueden representar un problema grave que constituye Radio broadcast assistant. No espere a ver si los s?ntomas desaparecen. Solicite atenci?n m?dica de inmediato. Comun?quese con el servicio de emergencias de su localidad (911 en los [place]). No conduzca por sus propios medios [employer]. ?Resumen ?El [place] hiperosmolar hipergluc?mico es una afecci?n grave que implica un aumento extremo del nivel de az?car (glucosa) en la sangre. Esto produce una deshidrataci?n extrema, lo que puede poner en peligro la vida. ?Esta afecci?n es resultado de la diabetes no controlada o no diagnosticada. Es m?s frecuente en las personas que tienen diabetes tipo 2 (diabetes mellitus tipo 2). ?Esta afecci?n se trata en el hospital. El tratamiento puede incluir la administraci?n de l?quidos por v?a intravenosa, insulina y otros medicamentos. ?Siga el plan de control de la diabetes y contr?lese la glucemia con la frecuencia que le haya indicado el m?dico. ?Aseg?rese de [employer] signos y los s?ntomas de hiperglucemia, que incluyen aumento de la sed, Geographical information systems officer con m?s frecuencia de lo habitual y problemas visuales o p?rdida de la visi?n. ?Esta informaci?n no tiene Theme park manager el consejo del m?dico. Aseg?rese de hacerle al m?dico cualquier pregunta que tenga. ?Document Revised: 12/22/2019 Document Reviewed: 12/22/2019 ?Elsevier Patient Education ? 2022 Elsevier  Inc. ? ?

## 2021-06-08 NOTE — Progress Notes (Signed)
? ?Subjective:  ?Patient ID: Joseph Roberts, male    DOB: [birth year]  Age: 61 y.o. MRN: [id number] ? ?CC: Diabetes ? ? 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 Patient does not check blood sugar at home ? ?Compliant with meds - Yes ?Checking CBGs? No ? Fasting avg -  ? Postprandial average -  ?Exercising regularly? - Yes ?Watching carbohydrate intake? - Yes ?Neuropathy ? - No ?Hypoglycemic events - No ? - Recovers with :  ? ?Pertinent ROS:  ?Polyuria - No ?Polydipsia - No ?Vision problems - No ? ?Medications as noted below. Taking them regularly without complication/adverse reaction being reported today.  ? ?History ?Joseph Roberts has a past medical history of Diabetes mellitus (2011), High triglycerides, and Hypertension.  ? ?Joseph Roberts has a past surgical history that includes Colonoscopy (N/A, 10/24/2013).  ? ?His family history includes Diabetes in his brother, mother, and sister.Joseph Roberts reports that Joseph Roberts has been smoking cigarettes. Joseph Roberts has a 15.00 pack-year smoking history. Joseph Roberts has never used smokeless tobacco. Joseph Roberts reports that Joseph Roberts does not drink alcohol and does not use drugs. ? ?Current Outpatient Medications on File Prior to Visit  ?Medication Sig Dispense Refill  ? atorvastatin (LIPITOR) 40 MG tablet Take 1 tablet (40 mg total) by mouth daily. 90 tablet 1  ? Blood Glucose Monitoring Suppl (ONE TOUCH ULTRA MINI) W/DEVICE KIT 1 each by Does not apply route daily. Test fasting daily    ? Blood Glucose Monitoring Suppl (TRUE METRIX METER) w/Device KIT Use as instructed. Check blood glucose level by fingerstick three times per day. 1 kit 0  ? CETIRIZINE HCL PO Take by mouth daily.    ? glucose blood (TRUE METRIX BLOOD GLUCOSE TEST) test strip Use as instructed. Check blood glucose level by fingerstick three times per day. 100 each 12  ? Insulin Pen Needle (TRUEPLUS PEN NEEDLES) 31G X 5 MM MISC Use as directed to inject insuin into the skin 4 times  daily. 200 each 3  ? Insulin Syringe-Needle U-100 (TRUEPLUS INSULIN SYRINGE) 31G X 5/16" 0.3 ML MISC Use to inject Humalog TID. 100 each 2  ? lisinopril (ZESTRIL) 5 MG tablet Take 1 tablet (5 mg total) by mouth daily. 90 tablet 1  ? TRUEplus Lancets 28G MISC Use as instructed. Check blood glucose level by fingerstick three times per day. 100 each 3  ? meloxicam (MOBIC) 7.5 MG tablet Take 1 tablet (7.5 mg total) by mouth daily. 30 tablet 2  ? ?No current facility-administered medications on file prior to visit.  ? ? ?ROS ?Comprehensive ROS Pertinent positive and negative noted in HPI   ? ?Objective:  ?BP 108/77 (BP Location: Right Arm, Patient Position: Sitting, Cuff Size: Normal)   Pulse 85   Temp 98 ?F (36.7 ?C) (Oral)   Ht _0  (1.6 m)   Wt 152 lb 6.4 oz (69.1 kg)   SpO2 97%   BMI 27.00 kg/m?  ? ?BP Readings from Last 3 Encounters:  ?06/08/21 108/77  ?02/20/21 124/83  ?11/21/20 112/76  ? ? ?Wt Readings from Last 3 Encounters:  ?06/08/21 152 lb 6.4 oz (69.1 kg)  ?02/20/21 159 lb 3.2 oz (72.2 kg)  ?11/21/20 146 lb 12.8 oz (66.6 kg)  ? ? ?Physical Exam ?Vitals reviewed.  ?Constitutional:   ?   Appearance: Normal appearance.  ?HENT:  ?   Head: Normocephalic.  ?   Right Ear: Tympanic membrane and external ear normal.  ?   Left  Ear: Tympanic membrane and external ear normal.  ?   Nose: Nose normal.  ?Eyes:  ?   Extraocular Movements: Extraocular movements intact.  ?   Conjunctiva/sclera: Conjunctivae normal.  ?   Pupils: Pupils are equal, round, and reactive to light.  ?Cardiovascular:  ?   Rate and Rhythm: Normal rate and regular rhythm.  ?Pulmonary:  ?   Effort: Pulmonary effort is normal.  ?   Breath sounds: Normal breath sounds.  ?Abdominal:  ?   General: Bowel sounds are normal. There is distension.  ?   Palpations: Abdomen is soft.  ?Musculoskeletal:     ?   General: Normal range of motion.  ?   Cervical back: Normal range of motion.  ?Skin: ?   General: Skin is warm and dry.  ?Neurological:  ?   Mental  Status: Joseph Roberts is alert and oriented to person, place, and time.  ?Psychiatric:     ?   Mood and Affect: Mood normal.     ?   Behavior: Behavior normal.     ?   Thought Content: Thought content normal.     ?   Judgment: Judgment normal.  ? ?Lab Results  ?Component Value Date  ? HGBA1C 10.6 (A) 06/08/2021  ? HGBA1C 8.4 (A) 02/20/2021  ? HGBA1C 12.2 (A) 11/21/2020  ? ? ?Lab Results  ?Component Value Date  ? WBC 10.5 05/26/2019  ? HGB 17.2 05/26/2019  ? HCT 48.8 05/26/2019  ? PLT 289 05/26/2019  ? GLUCOSE 128 (H) 01/19/2021  ? CHOL 100 01/19/2021  ? TRIG 96 01/19/2021  ? HDL 34 (L) 01/19/2021  ? [place] 47 01/19/2021  ? ALT 28 01/19/2021  ? AST 17 01/19/2021  ? NA 138 01/19/2021  ? K 4.1 01/19/2021  ? CL 105 01/19/2021  ? CREATININE 0.77 01/19/2021  ? BUN 13 01/19/2021  ? CO2 20 01/19/2021  ? TSH 1.976 04/06/2013  ? HGBA1C 10.6 (A) 06/08/2021  ? MICROALBUR 34.9 10/17/2015  ? ? ? ?Assessment & Plan:  ? ?Joseph Roberts was seen today for diabetes. ? ?Diagnoses and all orders for this visit: ? ?Type 2 diabetes mellitus with hyperglycemia, with long-term current use of insulin ([place]) ?-     HgB A1c 10.6 ?Discussed  co- morbidities with uncontrol diabetes  Complications -diabetic retinopathy, (close your eyes ? What do you see nothing) nephropathy decrease in kidney function- can lead to dialysis-on a machine 3 days a week to filter your kidney, neuropathy- numbness and tinging in your hands and feet,  increase risk of heart attack and stroke, and amputation due to decrease wound healing and circulation. Decrease your risk by taking medication daily as prescribed, monitor carbohydrates- foods that are high in carbohydrates are the following rice, potatoes, breads, sugars, and pastas.  Reduction in the intake (eating) will assist in lowering your blood sugars. Exercise daily at least 30 minutes daily.  ? 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 I am also having him maintain his ONE TOUCH ULTRA MINI, CETIRIZINE HCL PO,  True Metrix Meter, True Metrix Blood Glucose Test, TRUEplus Lancets 28G, TRUEplus Pen Needles, Insulin Syringe-Needle U-100, meloxicam, atorvastatin, lisinopril, Trulicity, glipiZIDE, and metFORMIN. ? ?Meds ordered this encounter  ?Medications  ? Insulin Glargine (BASAGLAR KWIKPEN) 100 UNIT/ML  ?  Sig: Inject 10 Units into the skin 2 (two) times daily.  ?  Dispense:  3 mL  ?  Refill:  3  ? Dulaglutide (TRULICITY) 3 ON/6.2XB SOPN  ?  Sig: Inject 3 mg as  directed once a week.  ?  Dispense:  2 mL  ?  Refill:  3  ? glipiZIDE (GLUCOTROL) 10 MG tablet  ?  Sig: Take 1 tablet (10 mg total) by mouth 2 (two) times daily before a meal.  ?  Dispense:  120 tablet  ?  Refill:  1  ? metFORMIN (GLUCOPHAGE-XR) 500 MG 24 hr tablet  ?  Sig: Take 2 tablets (1,000 mg total) by mouth 2 (two) times daily.  ?  Dispense:  120 tablet  ?  Refill:  2  ? ? ? ?Follow-up:  ? ?Return in about 3 months (around 09/07/2021) for DM. ? ?The above assessment and management plan was discussed with the patient. The patient verbalized understanding of and has agreed to the management plan. Patient is aware to call the clinic if symptoms fail to improve or worsen. Patient is aware when to return to the clinic for a follow-up visit. Patient educated on when it is appropriate to go to the emergency department.  ? ?Juluis Mire, NP-C ? ?  ?

## 2021-06-09 ENCOUNTER — Other Ambulatory Visit: Payer: Self-pay

## 2021-07-07 ENCOUNTER — Ambulatory Visit: Payer: Self-pay | Attending: Nurse Practitioner | Admitting: Pharmacist

## 2021-07-07 ENCOUNTER — Other Ambulatory Visit: Payer: Self-pay

## 2021-07-07 ENCOUNTER — Other Ambulatory Visit (INDEPENDENT_AMBULATORY_CARE_PROVIDER_SITE_OTHER): Payer: Self-pay | Admitting: Primary Care

## 2021-07-07 ENCOUNTER — Telehealth: Payer: Self-pay | Admitting: Pharmacist

## 2021-07-07 DIAGNOSIS — E1165 Type 2 diabetes mellitus with hyperglycemia: Secondary | ICD-10-CM

## 2021-07-07 DIAGNOSIS — R972 Elevated prostate specific antigen [PSA]: Secondary | ICD-10-CM

## 2021-07-07 DIAGNOSIS — Z794 Long term (current) use of insulin: Secondary | ICD-10-CM

## 2021-07-07 MED ORDER — INSULIN PEN NEEDLE 31G X 5 MM MISC
3 refills | Status: DC
  Filled 2021-07-07: qty 100, 25d supply, fill #0

## 2021-07-07 NOTE — Progress Notes (Signed)
? ? ?  S:    ?PCP: Zelda  ? ?Patient arrives in good spirits.  Presents for diabetes evaluation, education, and management. Patient was referred and last seen by Primary Care Provider Juluis Mire on 06/08/21. A1c was elevated at that appointment. ? ?Patient reports Diabetes was diagnosed 10 years ago. He tells me today that he believes his last A1c was elevated d/t dietary indiscretion. Since that visit, he is trying to do a better job in regards to his diet. Of note, he presents today with a CC of dysuria and polyuria that started a couple of months ago. Endorses associated abdominal pain and incomplete bladder emptying. Denies the presence of hematuria. He does have a history of elevated PSA.   ? ?Family/Social History:  ?-Current smoker ?-Mother, sister, and brother have diabetes ? ?Insurance coverage/medication affordability: self pay ? ?Medication adherence reported. ?Current diabetes medications include:  ?-Trulicity 3mg  weekly ?-Lantus 10 units BID ?-Metformin 500 mg XR, two tablets (1000 mg) twice daily ?-Glipizide 10 mg twice daily ? ?Patient denies hypoglycemic events. ? ?Patient reported dietary habits: Eats 3 meals/day ?Breakfast:milk, sandwih, toast ?Lunch:Soup ?Dinner:Cereal with milk ?Drinks:water, juice, occasional soda ?Diet overall seems very poor and rich in carbohydrates. Patient was given counseling on proper diet. ? ?Patient-reported exercise habits: none outside of work ?  ?Patient denies neuropathy (nerve pain). ?Patient denies visual changes.  ?Patient reports self foot exams.  ?  ? ?O:  ? ?Lab Results  ?Component Value Date  ? HGBA1C 10.6 (A) 06/08/2021  ? ?There were no vitals filed for this visit. ? ?Lipid Panel  ?   ?Component Value Date/Time  ? CHOL 100 01/19/2021 0921  ? TRIG 96 01/19/2021 0921  ? HDL 34 (L) 01/19/2021 0921  ? CHOLHDL 2.9 01/19/2021 0921  ? CHOLHDL 4.7 03/07/2016 1046  ? VLDL 61 (H) 03/07/2016 1046  ? LDLCALC 47 01/19/2021 0921  ? ?No meter today.  ?Patient reports  home readings ? ?Clinical Atherosclerotic Cardiovascular Disease (ASCVD): No  ?The ASCVD Risk score (Arnett DK, et al., 2019) failed to calculate for the following reasons: ?  The valid total cholesterol range is 130 to 320 mg/dL  ? ?A/P: ?Diabetes longstanding.  Patient is able to verbalize appropriate hypoglycemia management plan. Medication adherence appears adequate. We have worked with him before and his A1c improved from 12.2 to 8.4%. It is now up to 10.6% and he attributes this to dietary indiscretion. Will condense and increase insulin dose.  ?-Continue Trulicity 3 mg weekly to help with diet ?-Increase Basaglar to 24 units daily ?-Continue Metformin 1000 mg XR twice daily ?-Continue Glipizide 10 mg twice daily. Patient is instructed to stop taking glipizide altogether if patient experiences symptoms of hypoglycemia. Patient voiced understanding of the plan. ?-Extensively discussed pathophysiology of diabetes, recommended lifestyle interventions, dietary effects on blood sugar control ?-Counseled on s/sx of and management of hypoglycemia ?-Next A1C anticipated 09/2021.  ? ?Written patient instructions provided.  Total time in face to face counseling 35 minutes.  Follow up clinic visit with me in 1 month.   ? ?Thank you for allowing pharmacy to participate in this patient's care. ? ?Benard Halsted, PharmD, BCACP, CPP ?Clinical Pharmacist ?[place] ?[phone] ? ? ? ?

## 2021-07-07 NOTE — Telephone Encounter (Signed)
Saw pt today for DM management, however, he presents with urinary urgency, incomplete voiding of his bladder, pyuria, and dysuria. Ordering UA and PSA today, but he has been referred to Urology in the past for an elevated PSA. He was unable to be seen d/t not being approved for CAFA. He tells me today he completed the application for and received approval for CAFA.  ? ?Including Alinda Sierras on this thread as well. Can we submit a referral to Urology for him?  ?

## 2021-07-07 NOTE — Telephone Encounter (Signed)
Please place urology referral 

## 2021-07-08 LAB — URINALYSIS, ROUTINE W REFLEX MICROSCOPIC
Bilirubin, UA: NEGATIVE
Ketones, UA: NEGATIVE
Leukocytes,UA: NEGATIVE
Nitrite, UA: NEGATIVE
RBC, UA: NEGATIVE
Specific Gravity, UA: >=1.03 — AB (ref 1.005–1.030)
Urobilinogen, Ur: 0.2 mg/dL (ref 0.2–1.0)
pH, UA: 5.5 (ref 5.0–7.5)

## 2021-07-08 LAB — MICROSCOPIC EXAMINATION
Bacteria, UA: NONE SEEN
Casts: NONE SEEN /lpf
Epithelial Cells (non renal): NONE SEEN /hpf (ref 0–10)
RBC, Urine: NONE SEEN /hpf (ref 0–2)
WBC, UA: NONE SEEN /hpf (ref 0–5)

## 2021-07-08 LAB — PSA: Prostate Specific Ag, Serum: 12.3 ng/mL — ABNORMAL HIGH (ref 0.0–4.0)

## 2021-07-10 ENCOUNTER — Other Ambulatory Visit (INDEPENDENT_AMBULATORY_CARE_PROVIDER_SITE_OTHER): Payer: Self-pay | Admitting: Primary Care

## 2021-07-10 DIAGNOSIS — R972 Elevated prostate specific antigen [PSA]: Secondary | ICD-10-CM

## 2021-07-21 ENCOUNTER — Other Ambulatory Visit: Payer: Self-pay

## 2021-07-25 ENCOUNTER — Other Ambulatory Visit: Payer: Self-pay

## 2021-08-03 ENCOUNTER — Other Ambulatory Visit: Payer: Self-pay

## 2021-08-11 ENCOUNTER — Other Ambulatory Visit: Payer: Self-pay

## 2021-08-11 ENCOUNTER — Ambulatory Visit: Payer: Self-pay | Attending: Nurse Practitioner | Admitting: Pharmacist

## 2021-08-11 DIAGNOSIS — E1165 Type 2 diabetes mellitus with hyperglycemia: Secondary | ICD-10-CM

## 2021-08-11 DIAGNOSIS — Z794 Long term (current) use of insulin: Secondary | ICD-10-CM

## 2021-08-11 DIAGNOSIS — Z760 Encounter for issue of repeat prescription: Secondary | ICD-10-CM

## 2021-08-11 MED ORDER — TRUE METRIX BLOOD GLUCOSE TEST VI STRP
ORAL_STRIP | 12 refills | Status: DC
  Filled 2021-08-11: qty 100, 34d supply, fill #0
  Filled 2021-10-02: qty 100, 34d supply, fill #1
  Filled 2022-02-27: qty 100, 34d supply, fill #2
  Filled 2022-03-30: qty 100, 34d supply, fill #3

## 2021-08-11 MED ORDER — BASAGLAR KWIKPEN 100 UNIT/ML ~~LOC~~ SOPN
26.0000 [IU] | PEN_INJECTOR | Freq: Every day | SUBCUTANEOUS | 2 refills | Status: DC
  Filled 2021-08-11 – 2021-08-31 (×2): qty 9, 34d supply, fill #0

## 2021-08-11 NOTE — Progress Notes (Signed)
    S:    PCP: Zelda   Presents for diabetes evaluation, education, and management. Patient was referred and last seen by Primary Care Provider Gwinda Passe on 06/08/21. A1c was elevated at that appointment. Patient reports Diabetes was diagnosed 10 years ago. He tells me today that he believes his last A1c was elevated d/t dietary indiscretion. Since that visit, he is trying to do a better job in regards to his diet.   Today patient arrives in good spirits with a cc of coughing x 2 weeks with yellow mucus. He has tried some cough syrup over the counter with no relief. No increased work or trouble breathing noted during appointment. At previous appointment, patient complaint of dysuria and polyuria which has now resolved. Denies abdominal pain or hematuria.   Family/Social History:  -Current smoker -Mother, sister, and brother have diabetes  Medication adherence reported. Current diabetes medications include:  -Trulicity 3mg  weekly on Tuesdays -Basaglar 24 units daily -Metformin 500 mg XR, two tablets (1000 mg) twice daily -Glipizide 10 mg twice daily  Reports fasting home blood glucoses 140-150s. Patient denies hypoglycemic events.   Patient reported dietary habits: Eats 3 meals/day Breakfast:milk, sandwih, toast Lunch:Soup Dinner:Cereal with milk Drinks:water, juice, occasional soda Diet overall seems very poor and rich in carbohydrates.   Patient-reported exercise habits: none outside of work  O:   Lab Results  Component Value Date   HGBA1C 10.6 (A) 06/08/2021   There were no vitals filed for this visit.  Lipid Panel     Component Value Date/Time   CHOL 100 01/19/2021 0921   TRIG 96 01/19/2021 0921   HDL 34 (L) 01/19/2021 0921   CHOLHDL 2.9 01/19/2021 0921   CHOLHDL 4.7 03/07/2016 1046   VLDL 61 (H) 03/07/2016 1046   LDLCALC 47 01/19/2021 0921   No meter today.   Clinical Atherosclerotic Cardiovascular Disease (ASCVD): No  The ASCVD Risk score (Arnett DK, et  al., 2019) failed to calculate for the following reasons:   The valid total cholesterol range is 130 to 320 mg/dL   A/P: Diabetes longstanding.  Patient is able to verbalize appropriate hypoglycemia management plan. Medication adherence appears adequate. We have worked with him before and his A1c improved from 12.2 to 8.4%. It is now up to 10.6% and he attributes this to dietary indiscretion.  -Continue Trulicity 3 mg weekly to help with diet and metformin twice daily -Increase Basaglar to 26 units daily -Stop Glipizide 10 mg twice daily  -Extensively discussed pathophysiology of diabetes, recommended lifestyle interventions, reinforced dietary effects on blood sugar control -Counseled on s/sx of and management of hypoglycemia -Next A1C anticipated 09/2021.  -Encouraged patient to follow up for urology appointment and eye exam (last eye exam ~5 years). Gave patient CAFA form to complete with instructions to mail in once completed.   Written patient instructions provided. Total time in face to face counseling 30 minutes. Next PCP visit 09/2021.  Thank you for allowing pharmacy to participate in this patient's care.  10/2021, PharmD PGY1 Acute Care Resident  08/11/2021,8:22 AM

## 2021-08-14 ENCOUNTER — Other Ambulatory Visit: Payer: Self-pay

## 2021-08-24 ENCOUNTER — Other Ambulatory Visit (HOSPITAL_COMMUNITY): Payer: Self-pay

## 2021-08-25 ENCOUNTER — Ambulatory Visit: Payer: Self-pay | Attending: Nurse Practitioner

## 2021-08-25 ENCOUNTER — Other Ambulatory Visit: Payer: Self-pay

## 2021-08-25 ENCOUNTER — Other Ambulatory Visit: Payer: Self-pay | Admitting: Nurse Practitioner

## 2021-08-25 ENCOUNTER — Other Ambulatory Visit (HOSPITAL_COMMUNITY): Payer: Self-pay

## 2021-08-25 DIAGNOSIS — R339 Retention of urine, unspecified: Secondary | ICD-10-CM

## 2021-08-25 MED ORDER — TAMSULOSIN HCL 0.4 MG PO CAPS
ORAL_CAPSULE | ORAL | 0 refills | Status: DC
  Filled 2021-08-25: qty 90, 90d supply, fill #0

## 2021-08-25 NOTE — Progress Notes (Signed)
no

## 2021-08-26 LAB — BASIC METABOLIC PANEL
BUN/Creatinine Ratio: 21 (ref 10–24)
BUN: 18 mg/dL (ref 8–27)
CO2: 17 mmol/L — ABNORMAL LOW (ref 20–29)
Calcium: 9.7 mg/dL (ref 8.6–10.2)
Chloride: 104 mmol/L (ref 96–106)
Creatinine, Ser: 0.87 mg/dL (ref 0.76–1.27)
Glucose: 328 mg/dL — ABNORMAL HIGH (ref 70–99)
Potassium: 4.5 mmol/L (ref 3.5–5.2)
Sodium: 138 mmol/L (ref 134–144)
eGFR: 99 mL/min/{1.73_m2} (ref >59)

## 2021-08-31 ENCOUNTER — Other Ambulatory Visit: Payer: Self-pay

## 2021-09-04 ENCOUNTER — Other Ambulatory Visit: Payer: Self-pay

## 2021-09-12 ENCOUNTER — Other Ambulatory Visit: Payer: Self-pay

## 2021-09-12 ENCOUNTER — Ambulatory Visit (INDEPENDENT_AMBULATORY_CARE_PROVIDER_SITE_OTHER): Payer: Self-pay | Admitting: Primary Care

## 2021-09-12 ENCOUNTER — Encounter (INDEPENDENT_AMBULATORY_CARE_PROVIDER_SITE_OTHER): Payer: Self-pay | Admitting: Primary Care

## 2021-09-12 VITALS — BP 130/81 | HR 67 | Temp 98.1°F | Ht 63.0 in | Wt 155.2 lb

## 2021-09-12 DIAGNOSIS — E1165 Type 2 diabetes mellitus with hyperglycemia: Secondary | ICD-10-CM

## 2021-09-12 DIAGNOSIS — Z760 Encounter for issue of repeat prescription: Secondary | ICD-10-CM

## 2021-09-12 DIAGNOSIS — I10 Essential (primary) hypertension: Secondary | ICD-10-CM

## 2021-09-12 DIAGNOSIS — Z7189 Other specified counseling: Secondary | ICD-10-CM

## 2021-09-12 DIAGNOSIS — Z794 Long term (current) use of insulin: Secondary | ICD-10-CM

## 2021-09-12 DIAGNOSIS — E1169 Type 2 diabetes mellitus with other specified complication: Secondary | ICD-10-CM

## 2021-09-12 DIAGNOSIS — E782 Mixed hyperlipidemia: Secondary | ICD-10-CM

## 2021-09-12 LAB — POCT GLYCOSYLATED HEMOGLOBIN (HGB A1C): Hemoglobin A1C: 13.3 % — AB (ref 4.0–5.6)

## 2021-09-12 MED ORDER — LISINOPRIL 5 MG PO TABS
5.0000 mg | ORAL_TABLET | Freq: Every day | ORAL | 1 refills | Status: DC
  Filled 2021-09-12: qty 90, 90d supply, fill #0
  Filled 2022-02-27: qty 90, 90d supply, fill #1

## 2021-09-12 MED ORDER — TRULICITY 3 MG/0.5ML ~~LOC~~ SOAJ
3.0000 mg | SUBCUTANEOUS | 3 refills | Status: DC
  Filled 2021-09-12: qty 2, 28d supply, fill #0

## 2021-09-12 MED ORDER — METFORMIN HCL ER 500 MG PO TB24
1000.0000 mg | ORAL_TABLET | Freq: Two times a day (BID) | ORAL | 2 refills | Status: DC
  Filled 2021-09-12: qty 120, 30d supply, fill #0

## 2021-09-12 MED ORDER — BASAGLAR KWIKPEN 100 UNIT/ML ~~LOC~~ SOPN
14.0000 [IU] | PEN_INJECTOR | Freq: Two times a day (BID) | SUBCUTANEOUS | 2 refills | Status: DC
  Filled 2021-09-12: qty 16, 57d supply, fill #0
  Filled 2021-10-02: qty 15, 54d supply, fill #0

## 2021-09-12 NOTE — Progress Notes (Signed)
Subjective:  Patient ID: Joseph Roberts, male    DOB: 22-Sep-1960  Age: 61 y.o. 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 Patient does check blood sugar at home  Compliant with meds - Yes ( Truilicity was out )  Checking CBGs? Yes  Fasting avg - 200-300  Postprandial average -  Exercising regularly? - Yes Watching carbohydrate intake? - No Neuropathy ? - No Hypoglycemic events - No  - Recovers with :   Pertinent ROS:  Polyuria - No Polydipsia - No Vision problems - No  Medications as noted below. Taking them regularly without complication/adverse reaction being reported today.   [name] has a past medical history of Diabetes mellitus (2011), High triglycerides, and Hypertension.   He has a past surgical history that includes Colonoscopy (N/A, 10/24/2013).   His family history includes Diabetes in his brother, mother, and sister.He reports that he has been smoking cigarettes. He has a 15.00 pack-year smoking history. He has never used smokeless tobacco. He reports that he does not drink alcohol and does not use drugs.  Current Outpatient Medications on File Prior to Visit  Medication Sig Dispense Refill   atorvastatin (LIPITOR) 40 MG tablet Take 1 tablet (40 mg total) by mouth daily. 90 tablet 1   Blood Glucose Monitoring Suppl (ONE TOUCH ULTRA MINI) W/DEVICE KIT 1 each by Does not apply route daily. Test fasting daily     Blood Glucose Monitoring Suppl (TRUE METRIX METER) w/Device KIT Use as instructed. Check blood glucose level by fingerstick three times per day. 1 kit 0   CETIRIZINE HCL PO Take by mouth daily.     glucose blood (TRUE METRIX BLOOD GLUCOSE TEST) test strip Use as instructed. Check blood glucose level by fingerstick three times per day. 100 each 12   Insulin Pen Needle 31G X 5 MM MISC use as directed to inject insulin 4 times daily 200 each 3   Insulin  Syringe-Needle U-100 (TRUEPLUS INSULIN SYRINGE) 31G X 5/16" 0.3 ML MISC Use to inject Humalog TID. 100 each 2   meloxicam (MOBIC) 7.5 MG tablet Take 1 tablet (7.5 mg total) by mouth daily. 30 tablet 2   tamsulosin (FLOMAX) 0.4 MG CAPS capsule Take 1 capsule by mouth once daily 90 capsule 0   TRUEplus Lancets 28G MISC Use as instructed. Check blood glucose level by fingerstick three times per day. 100 each 3   No current facility-administered medications on file prior to visit.    ROS Comprehensive ROS Pertinent positive and negative noted in HPI    Objective:  BP 130/81   Pulse 67   Temp 98.1 F (36.7 C) (Oral)   Ht '5\' 3"'  (1.6 m)   Wt 155 lb 3.2 oz (70.4 kg)   SpO2 97%   BMI 27.49 kg/m   BP Readings from Last 3 Encounters:  09/12/21 130/81  06/08/21 108/77  02/20/21 124/83    Wt Readings from Last 3 Encounters:  09/12/21 155 lb 3.2 oz (70.4 kg)  06/08/21 152 lb 6.4 oz (69.1 kg)  02/20/21 159 lb 3.2 oz (72.2 kg)    Physical Exam Vitals reviewed.  Constitutional:      Appearance: Normal appearance.  HENT:     Right Ear: External ear normal.     Left Ear: External ear normal.     Nose: Nose normal.  Eyes:     Extraocular Movements: Extraocular movements intact.  Cardiovascular:     Rate  and Rhythm: Normal rate and regular rhythm.  Pulmonary:     Effort: Pulmonary effort is normal.     Breath sounds: Normal breath sounds.  Abdominal:     General: Bowel sounds are normal. There is distension.     Palpations: Abdomen is soft.  Musculoskeletal:        General: Normal range of motion.     Cervical back: Normal range of motion.  Skin:    General: Skin is warm and dry.  Neurological:     Mental Status: He is alert and oriented to person, place, and time.  Psychiatric:        Mood and Affect: Mood normal.        Behavior: Behavior normal.   Lab Results  Component Value Date   HGBA1C 13.3 (A) 09/12/2021   HGBA1C 10.6 (A) 06/08/2021   HGBA1C 8.4 (A) 02/20/2021     Lab Results  Component Value Date   WBC 10.5 05/26/2019   HGB 17.2 05/26/2019   HCT 48.8 05/26/2019   PLT 289 05/26/2019   GLUCOSE 328 (H) 08/25/2021   CHOL 100 01/19/2021   TRIG 96 01/19/2021   HDL 34 (L) 01/19/2021   LDLCALC 47 01/19/2021   ALT 28 01/19/2021   AST 17 01/19/2021   NA 138 08/25/2021   K 4.5 08/25/2021   CL 104 08/25/2021   CREATININE 0.87 08/25/2021   BUN 18 08/25/2021   CO2 17 (L) 08/25/2021   TSH 1.976 04/06/2013   HGBA1C 13.3 (A) 09/12/2021   MICROALBUR 34.9 10/17/2015     Assessment & Plan:  Joseph Roberts was seen today for diabetes.  Diagnoses and all orders for this visit:  Type 2 diabetes mellitus with hyperglycemia, with long-term current use of insulin ([place]) Monitor foods that are high in carbohydrates are the following rice, potatoes, breads, sugars, and pastas.  Reduction in the intake (eating) will assist in lowering your blood sugars. ( 4 Tortillas ) agreed to 2 Tortillas daily and monitoring amount of rice)  -     HgB A1c 10.3 ( increase Basaglar  to 14 units BID -     Dulaglutide (TRULICITY) 3 CL/2.7NT SOPN; Inject 3 mg as directed once a week. -     metFORMIN (GLUCOPHAGE-XR) 500 MG 24 hr tablet; Take 2 tablets (1,000 mg total) by mouth 2 (two) times daily. Recommended a Diabetic eye exam   Medication refill -     Dulaglutide (TRULICITY) 3 ZG/0.1VC SOPN; Inject 3 mg as directed once a week. -     Insulin Glargine (BASAGLAR KWIKPEN) 100 UNIT/ML; Inject 14 Units into the skin 2 (two) times daily. -     metFORMIN (GLUCOPHAGE-XR) 500 MG 24 hr tablet; Take 2 tablets (1,000 mg total) by mouth 2 (two) times daily. -     lisinopril (ZESTRIL) 5 MG tablet; Take 1 tablet (5 mg total) by mouth daily.  Dyslipidemia with low high density lipoprotein (HDL) cholesterol with hypertriglyceridemia due to type 2 diabetes mellitus ([place]) -     Lipid Panel  Encounter for diabetes education Type 2 diabetes, the body either doesn't produce enough insulin, or it  resists insulin. Symptoms include increased thirst, frequent urination, hunger, fatigue, and blurred vision. In some cases, there may be no symptoms. Treatments include diet, exercise, medication, and insulin therapy.discussed  co- morbidities with uncontrol diabetes  Complications -diabetic retinopathy, (close your eyes ? What do you see nothing) nephropathy decrease in kidney function- can lead to dialysis-on a machine 3 days a  week to filter your kidney, neuropathy- numbness and tinging in your hands and feet,  increase risk of heart attack and stroke, and amputation due to decrease wound healing and circulation. Decrease your risk by taking medication daily as prescribed, monitor carbohydrates- foods that are high in carbohydrates are the following rice, potatoes, breads, sugars, and pastas.  Reduction in the intake (eating) will assist in lowering your blood sugars. Exercise daily at least 30 minutes daily.   Essential hypertension -     lisinopril (ZESTRIL) 5 MG tablet; Take 1 tablet (5 mg total) by mouth daily.      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 I am also having him maintain his ONE TOUCH ULTRA MINI, CETIRIZINE HCL PO, True Metrix Meter, TRUEplus Lancets 28G, Insulin Syringe-Needle U-100, meloxicam, atorvastatin, Insulin Pen Needle, True Metrix Blood Glucose Test, tamsulosin, Trulicity, metFORMIN, and lisinopril.  Meds ordered this encounter  Medications   Dulaglutide (TRULICITY) 3 GE/3.6OQ SOPN    Sig: Inject 3 mg as directed once a week.    Dispense:  2 mL    Refill:  3    Order Specific Question:   Supervising Provider    Answer:   Tresa Garter [[id number]]   Insulin Glargine (BASAGLAR KWIKPEN) 100 UNIT/ML    Sig: Inject 14 Units into the skin 2 (two) times daily.    Dispense:  16 mL    Refill:  2    Order Specific Question:   Supervising Provider    Answer:   Tresa Garter [[id number]]   metFORMIN (GLUCOPHAGE-XR) 500 MG 24 hr tablet    Sig:  Take 2 tablets (1,000 mg total) by mouth 2 (two) times daily.    Dispense:  120 tablet    Refill:  2    Order Specific Question:   Supervising Provider    Answer:   Tresa Garter [[id number]]   lisinopril (ZESTRIL) 5 MG tablet    Sig: Take 1 tablet (5 mg total) by mouth daily.    Dispense:  90 tablet    Refill:  1    Order Specific Question:   Supervising Provider    Answer:   Tresa Garter [id number]     Follow-up:   Return in about 3 months (around 12/13/2021) for DM.  The above assessment and management plan was discussed with the patient. The patient verbalized understanding of and has agreed to the management plan. Patient is aware to call the clinic if symptoms fail to improve or worsen. Patient is aware when to return to the clinic for a follow-up visit. Patient educated on when it is appropriate to go to the emergency department.   Juluis Mire, NP-C

## 2021-09-12 NOTE — Patient Instructions (Signed)
Diabetes mellitus y nutricin, en adultos Diabetes Mellitus and Nutrition, Adult Si sufre de diabetes, o diabetes mellitus, es muy importante tener hbitos alimenticios saludables debido a que sus niveles de azcar en la sangre (glucosa) se ven afectados en gran medida por lo que come y bebe. Comer alimentos saludables en las cantidades correctas, aproximadamente a la misma hora todos los das, lo ayudar a: Controlar su glucemia. Disminuir el riesgo de sufrir una enfermedad cardaca. Mejorar la presin arterial. Alcanzar o mantener un peso saludable. Qu puede afectar mi plan de alimentacin? Todas las personas que sufren de diabetes son diferentes y cada una tiene necesidades diferentes en cuanto a un plan de alimentacin. El mdico puede recomendarle que trabaje con un nutricionista para elaborar el mejor plan para usted. Su plan de alimentacin puede variar segn factores como: Las caloras que necesita. Los medicamentos que toma. Su peso. Sus niveles de glucemia, presin arterial y colesterol. Su nivel de actividad. Otras afecciones que tenga, como enfermedades cardacas o renales. Cmo me afectan los carbohidratos? Los carbohidratos, o hidratos de carbono, afectan su nivel de glucemia ms que cualquier otro tipo de alimento. La ingesta de carbohidratos aumenta la cantidad de glucosa en la sangre. Es importante conocer la cantidad de carbohidratos que se pueden ingerir en cada comida sin correr ningn riesgo. Esto es diferente en cada persona. Su nutricionista puede ayudarlo a calcular la cantidad de carbohidratos que debe ingerir en cada comida y en cada refrigerio. Cmo me afecta el alcohol? El alcohol puede provocar una disminucin de la glucemia (hipoglucemia), especialmente si usa insulina o toma determinados medicamentos por va oral para la diabetes. La hipoglucemia es una afeccin potencialmente mortal. Los sntomas de la hipoglucemia, como somnolencia, mareos y confusin, son  similares a los sntomas de haber consumido demasiado alcohol. No beba alcohol si: Su mdico le indica no hacerlo. Est embarazada, puede estar embarazada o est tratando de quedar embarazada. Si bebe alcohol: Limite la cantidad que bebe a lo siguiente: De 0 a 1 medida por da para las mujeres. De 0 a 2 medidas por da para los hombres. Sepa cunta cantidad de alcohol hay en las bebidas que toma. En los [place], una medida equivale a una botella de cerveza de 12 oz (355 ml), un vaso de vino de 5 oz (148 ml) o un vaso de una bebida alcohlica de alta graduacin de 1 oz (44 ml). Mantngase hidratado bebiendo agua, refrescos dietticos o t helado sin azcar. Tenga en cuenta que los refrescos comunes, los jugos y otras bebidas para mezclar pueden contener mucha azcar y se deben contar como carbohidratos. Consejos para seguir este plan  Leer las etiquetas de los alimentos Comience por leer el tamao de la porcin en la etiqueta de Informacin nutricional de los alimentos envasados y las bebidas. La cantidad de caloras, carbohidratos, grasas y otros nutrientes detallados en la etiqueta se basan en una porcin del alimento. Muchos alimentos contienen ms de una porcin por envase. Verifique la cantidad total de gramos (g) de carbohidratos totales en una porcin. Verifique la cantidad de gramos de grasas saturadas y grasas trans en una porcin. Escoja alimentos que no contengan estas grasas o que su contenido de estas sea bajo. Verifique la cantidad de miligramos (mg) de sal (sodio) en una porcin. La mayora de las personas deben limitar la ingesta de sodio total a menos de 2300 mg por da. Siempre consulte la informacin nutricional de los alimentos etiquetados como "con bajo contenido de grasa" o "sin grasa".   Estos alimentos pueden tener un mayor contenido de azcar agregada o carbohidratos refinados, y deben evitarse. Hable con su nutricionista para identificar sus objetivos diarios en  cuanto a los nutrientes mencionados en la etiqueta. Al ir de compras Evite comprar alimentos procesados, enlatados o precocidos. Estos alimentos tienden a tener una mayor cantidad de grasa, sodio y azcar agregada. Compre en la zona exterior de la tienda de comestibles. Esta es la zona donde se encuentran con mayor frecuencia las frutas y las verduras frescas, los cereales a granel, las carnes frescas y los productos lcteos frescos. Al cocinar Use mtodos de coccin a baja temperatura, como hornear, en lugar de mtodos de coccin a alta temperatura, como frer en abundante aceite. Cocine con aceites saludables, como el aceite de oliva, canola o girasol. Evite cocinar con manteca, crema o carnes con alto contenido de grasa. Planificacin de las comidas Coma las comidas y los refrigerios regularmente, preferentemente a la misma hora todos los das. Evite pasar largos perodos de tiempo sin comer. Consuma alimentos ricos en fibra, como frutas frescas, verduras, frijoles y cereales integrales. Consuma entre 4 y 6 onzas (entre 112 y 168 g) de protenas magras por da, como carnes magras, pollo, pescado, huevos o tofu. Una onza (oz) (28 g) de protena magra equivale a: 1 onza (28 g) de carne, pollo o pescado. 1 huevo.  taza (62 g) de tofu. Coma algunos alimentos por da que contengan grasas saludables, como aguacates, frutos secos, semillas y pescado. Qu alimentos debo comer? Frutas Bayas. Manzanas. Naranjas. Duraznos. Damascos. Ciruelas. Uvas. Mangos. Papayas. Granadas. Kiwi. Cerezas. Verduras Verduras de hoja verde, que incluyen lechuga, espinaca, col rizada, acelga, hojas de berza, hojas de mostaza y repollo. Remolachas. Coliflor. Brcoli. Zanahorias. Judas verdes. Tomates. Pimientos. Cebollas. Pepinos. Coles de Bruselas. Granos Granos integrales, como panes, galletas, tortillas, cereales y pastas de salvado o integrales. Avena sin azcar. Quinua. Arroz integral o salvaje. Carnes y otras  protenas Frutos de mar. Carne de ave sin piel. Cortes magros de ave y carne de res. Tofu. Frutos secos. Semillas. Lcteos Productos lcteos sin grasa o con bajo contenido de grasa, como leche, yogur y queso. Es posible que los productos detallados arriba no constituyan una lista completa de los alimentos y las bebidas que puede tomar. Consulte a un nutricionista para obtener ms informacin. Qu alimentos debo evitar? Frutas Frutas enlatadas al almbar. Verduras Verduras enlatadas. Verduras congeladas con mantequilla o salsa de crema. Granos Productos elaborados con harina y harina blanca refinada, como panes, pastas, bocadillos y cereales. Evite todos los alimentos procesados. Carnes y otras protenas Cortes de carne con alto contenido de grasa. Carne de ave con piel. Carnes empanizadas o fritas. Carne procesada. Evite las grasas saturadas. Lcteos Yogur, queso o leche enteros. Bebidas Bebidas azucaradas, como gaseosas o t helado. Es posible que los productos que se enumeran ms arriba no constituyan una lista completa de los alimentos y las bebidas que debe evitar. Consulte a un nutricionista para obtener ms informacin. Preguntas para hacerle al mdico Debo consultar con un especialista certificado en atencin y educacin sobre la diabetes? Es necesario que me rena con un nutricionista? A qu nmero puedo llamar si tengo preguntas? Cules son los mejores momentos para controlar la glucemia? Dnde encontrar ms informacin: American Diabetes Association (Asociacin Estadounidense de la Diabetes): diabetes.org Academy of Nutrition and Dietetics (Academia de Nutricin y Diettica): eatright.org National Institute of Diabetes and Digestive and Kidney Diseases (Instituto Nacional de la Diabetes y las Enfermedades Digestivas y Renales): niddk.nih.gov Association of Diabetes   Care & Education Specialists (Asociacin de Especialistas en Atencin y Educacin sobre la Diabetes):  diabeteseducator.org Resumen Es importante tener hbitos alimenticios saludables debido a que sus niveles de azcar en la sangre (glucosa) se ven afectados en gran medida por lo que come y bebe. Es importante consumir alcohol con prudencia. Un plan de comidas saludable lo ayudar a controlar la glucosa en sangre y a reducir el riesgo de enfermedades cardacas. El mdico puede recomendarle que trabaje con un nutricionista para elaborar el mejor plan para usted. Esta informacin no tiene como fin reemplazar el consejo del mdico. Asegrese de hacerle al mdico cualquier pregunta que tenga. Document Revised: 10/28/2019 Document Reviewed: 10/28/2019 Elsevier Patient Education  2023 Elsevier Inc.  

## 2021-09-13 ENCOUNTER — Other Ambulatory Visit (INDEPENDENT_AMBULATORY_CARE_PROVIDER_SITE_OTHER): Payer: Self-pay | Admitting: Primary Care

## 2021-09-13 LAB — LIPID PANEL
Chol/HDL Ratio: 5.3 ratio — ABNORMAL HIGH (ref 0.0–5.0)
Cholesterol, Total: 159 mg/dL (ref 100–199)
HDL: 30 mg/dL — ABNORMAL LOW (ref >39)
LDL Chol Calc (NIH): 65 mg/dL (ref 0–99)
Triglycerides: 409 mg/dL — ABNORMAL HIGH (ref 0–149)
VLDL Cholesterol Cal: 64 mg/dL — ABNORMAL HIGH (ref 5–40)

## 2021-09-22 ENCOUNTER — Other Ambulatory Visit: Payer: Self-pay

## 2021-09-29 ENCOUNTER — Other Ambulatory Visit: Payer: Self-pay

## 2021-09-29 ENCOUNTER — Other Ambulatory Visit (INDEPENDENT_AMBULATORY_CARE_PROVIDER_SITE_OTHER): Payer: Self-pay

## 2021-09-29 ENCOUNTER — Telehealth (INDEPENDENT_AMBULATORY_CARE_PROVIDER_SITE_OTHER): Payer: Self-pay

## 2021-09-29 DIAGNOSIS — E1169 Type 2 diabetes mellitus with other specified complication: Secondary | ICD-10-CM

## 2021-09-29 MED ORDER — GEMFIBROZIL 600 MG PO TABS
600.0000 mg | ORAL_TABLET | Freq: Two times a day (BID) | ORAL | 1 refills | Status: DC
  Filled 2021-09-29: qty 180, 90d supply, fill #0
  Filled 2022-02-12: qty 180, 90d supply, fill #1

## 2021-09-29 NOTE — Telephone Encounter (Signed)
-----   Message from Grayce Sessions, NP sent at 09/13/2021  2:19 PM EDT ----- Your cholesterol is high, Increase risk of heart attack and/or stroke.  To reduce your Cholesterol , Remember - more fruits and vegetables, more fish, and limit red meat and dairy products. More soy, nuts, beans, barley, lentils, oats and plant sterol ester enriched margarine instead of butter. I also encourage eliminating sugar and processed food. New script sent for gemfibrozil 600mg  bid

## 2021-09-29 NOTE — Telephone Encounter (Signed)
Call placed to patient with assistance of pacific interpreter [phone]) he is aware of results, diet advise and medication sent in. Maryjean Morn, CMA

## 2021-10-02 ENCOUNTER — Other Ambulatory Visit: Payer: Self-pay

## 2021-11-14 ENCOUNTER — Emergency Department (HOSPITAL_BASED_OUTPATIENT_CLINIC_OR_DEPARTMENT_OTHER)
Admission: EM | Admit: 2021-11-14 | Discharge: 2021-11-14 | Disposition: A | Discharge status: Home / Self Care | Payer: Self-pay | Attending: Emergency Medicine | Admitting: Emergency Medicine

## 2021-11-14 ENCOUNTER — Other Ambulatory Visit: Payer: Self-pay

## 2021-11-14 ENCOUNTER — Encounter (HOSPITAL_BASED_OUTPATIENT_CLINIC_OR_DEPARTMENT_OTHER): Payer: Self-pay

## 2021-11-14 DIAGNOSIS — Z794 Long term (current) use of insulin: Secondary | ICD-10-CM | POA: Insufficient documentation

## 2021-11-14 DIAGNOSIS — E119 Type 2 diabetes mellitus without complications: Secondary | ICD-10-CM | POA: Insufficient documentation

## 2021-11-14 DIAGNOSIS — L0231 Cutaneous abscess of buttock: Secondary | ICD-10-CM | POA: Insufficient documentation

## 2021-11-14 DIAGNOSIS — Z79899 Other long term (current) drug therapy: Secondary | ICD-10-CM | POA: Insufficient documentation

## 2021-11-14 DIAGNOSIS — Z7984 Long term (current) use of oral hypoglycemic drugs: Secondary | ICD-10-CM | POA: Insufficient documentation

## 2021-11-14 MED ORDER — DOXYCYCLINE HYCLATE 100 MG PO TABS
100.0000 mg | ORAL_TABLET | Freq: Once | ORAL | Status: AC
  Administered 2021-11-14: 100 mg via ORAL
  Filled 2021-11-14: qty 1

## 2021-11-14 MED ORDER — ACETAMINOPHEN 500 MG PO TABS
1000.0000 mg | ORAL_TABLET | Freq: Once | ORAL | Status: AC
  Administered 2021-11-14: 1000 mg via ORAL
  Filled 2021-11-14: qty 2

## 2021-11-14 MED ORDER — LIDOCAINE-EPINEPHRINE (PF) 2 %-1:200000 IJ SOLN
10.0000 mL | Freq: Once | INTRAMUSCULAR | Status: AC
  Administered 2021-11-14: 10 mL
  Filled 2021-11-14: qty 20

## 2021-11-14 MED ORDER — DOXYCYCLINE HYCLATE 100 MG PO TABS
100.0000 mg | ORAL_TABLET | Freq: Two times a day (BID) | ORAL | 0 refills | Status: DC
  Filled 2021-11-14: qty 14, 7d supply, fill #0

## 2021-11-14 NOTE — ED Triage Notes (Signed)
Pt presents to the ED with friend. Abscess noted to right buttocks that was first noticed one week ago. Area red and firm to touch. Pt A&Ox4 at time of triage. VSS. Denies fevers or chills at home.

## 2021-11-14 NOTE — ED Provider Notes (Signed)
[facility] EMERG[facility] DEPT Provider Note   CSN: [id number] Arrival date & time: 11/14/21  1946     History  Chief Complaint  Patient presents with   Wound Check    Joseph Roberts is a 61 y.o. male.  Patient with history of diabetes presents to the emergency department for evaluation of right gluteal pain and abscess over the past 1 week.  Symptoms have worsened and the pain was more severe today, prompting emergency department visit.  No fevers, vomiting.  No abdominal pain.  No treatments prior to arrival.  Area has been draining somewhat.  Patient reports compliance with diabetic medications but has not been checking sugars at home.       Home Medications Prior to Admission medications   Medication Sig Start Date End Date Taking? Authorizing Provider  Blood Glucose Monitoring Suppl (ONE TOUCH ULTRA MINI) W/DEVICE KIT 1 each by Does not apply route daily. Test fasting daily    [provider]  Blood Glucose Monitoring Suppl (TRUE METRIX METER) w/Device KIT Use as instructed. Check blood glucose level by fingerstick three times per day. 05/22/19   Gildardo Pounds, NP  CETIRIZINE HCL PO Take by mouth daily.    [provider]  Dulaglutide (TRULICITY) 3 ID/7.8EU SOPN Inject 3 mg as directed once a week. 09/12/21   Kerin Perna, NP  gemfibrozil (LOPID) 600 MG tablet Take 1 tablet (600 mg total) by mouth 2 (two) times daily before a meal. 09/29/21   Kerin Perna, NP  glucose blood (TRUE METRIX BLOOD GLUCOSE TEST) test strip Use as instructed. Check blood glucose level by fingerstick three times per day. 08/11/21   Charlott Rakes, MD  Insulin Glargine (BASAGLAR KWIKPEN) 100 UNIT/ML Inject 14 Units into the skin 2 (two) times daily. 09/12/21   Kerin Perna, NP  Insulin Pen Needle 31G X 5 MM MISC use as directed to inject insulin 4 times daily 08/29/20   Kerin Perna, NP  Insulin Syringe-Needle U-100 (TRUEPLUS INSULIN SYRINGE)  31G X 5/16" 0.3 ML MISC Use to inject Humalog TID. 08/29/20   Charlott Rakes, MD  lisinopril (ZESTRIL) 5 MG tablet Take 1 tablet (5 mg total) by mouth daily. 09/12/21   Kerin Perna, NP  meloxicam (MOBIC) 7.5 MG tablet Take 1 tablet (7.5 mg total) by mouth daily. 11/21/20   Kerin Perna, NP  metFORMIN (GLUCOPHAGE-XR) 500 MG 24 hr tablet Take 2 tablets (1,000 mg total) by mouth 2 (two) times daily. 09/12/21   Kerin Perna, NP  tamsulosin (FLOMAX) 0.4 MG CAPS capsule Take 1 capsule by mouth once daily 08/25/21     TRUEplus Lancets 28G MISC Use as instructed. Check blood glucose level by fingerstick three times per day. 12/01/19   Gildardo Pounds, NP      Allergies    Patient has no known allergies.    Review of Systems   Review of Systems  Physical Exam Updated Vital Signs BP 126/84 (BP Location: Right Arm)   Pulse (!) 103   Temp 98.7 F (37.1 C) (Oral)   Resp 18   Ht '5\' 4"'  (1.626 m)   Wt 72 kg   SpO2 99%   BMI 27.25 kg/m   Physical Exam Vitals and nursing note reviewed.  Constitutional:      Appearance: He is well-developed.  HENT:     Head: Normocephalic and atraumatic.  Eyes:     Conjunctiva/sclera: Conjunctivae normal.  Pulmonary:     Effort: No  respiratory distress.  Musculoskeletal:     Cervical back: Normal range of motion and neck supple.     Comments: Patient with area of induration with central erythema, right buttocks.  Gentle palpation of the area expresses a small amount of purulence from a sinus tract centrally.  There is several centimeters of surrounding cellulitis.  Skin:    General: Skin is warm and dry.  Neurological:     Mental Status: He is alert.     ED Results / Procedures / Treatments   Labs (all labs ordered are listed, but only abnormal results are displayed) Labs Reviewed - No data to display  EKG None  Radiology No results found.  Procedures .Marland KitchenIncision and Drainage  Date/Time: 11/14/2021 11:18 PM  Performed by:  Carlisle Cater, PA-C Authorized by: Carlisle Cater, PA-C   Consent:    Consent obtained:  Verbal   Consent given by:  Patient   Risks discussed:  Bleeding, pain, incomplete drainage and infection Universal protocol:    Patient identity confirmed:  Verbally with patient Location:    Type:  Abscess   Size:  4cm   Location:  Lower extremity   Lower extremity location:  Buttock   Buttock location:  R buttock Pre-procedure details:    Skin preparation:  Povidone-iodine Sedation:    Sedation type:  None Anesthesia:    Anesthesia method:  Local infiltration   Local anesthetic:  Lidocaine 2% WITH epi Procedure type:    Complexity:  Complex Procedure details:    Incision types:  Stab incision   Wound management:  Probed and deloculated   Drainage:  Purulent   Drainage amount:  Moderate   Wound treatment:  Drain placed   Packing materials:  1/2 in iodoform gauze Post-procedure details:    Procedure completion:  Tolerated well, no immediate complications     Medications Ordered in ED Medications  lidocaine-EPINEPHrine (XYLOCAINE W/EPI) 2 %-1:200000 (PF) injection 10 mL (has no administration in time range)  doxycycline (VIBRA-TABS) tablet 100 mg (has no administration in time range)  acetaminophen (TYLENOL) tablet 1,000 mg (has no administration in time range)    ED Course/ Medical Decision Making/ A&P    Patient seen and examined. History obtained directly from patient.   Labs/EKG: None ordered.  Imaging: None ordered.  Medications/Fluids: Ordered: Lidocaine 2%, doxycycline, Tylenol for headache.  Most recent vital signs reviewed and are as follows: BP 126/84 (BP Location: Right Arm)   Pulse (!) 103   Temp 98.7 F (37.1 C) (Oral)   Resp 18   Ht '5\' 4"'  (1.626 m)   Wt 72 kg   SpO2 99%   BMI 27.25 kg/m   Initial impression: Buttocks abscess with mild surrounding cellulitis.   11:19 PM Reassessment performed. Patient stable.  Tolerated procedure well.  Reviewed  pertinent lab work and imaging with patient at bedside. Questions answered.   Plan: Discharge to home.   Prescriptions written for: Doxycycline  Other home care instructions discussed:   The patient verbalized understanding and stated agreement with this plan.   ED return instructions discussed: The patient was urged to return to the Emergency Department urgently with worsening pain, swelling, expanding erythema especially if it streaks away from the affected area, fever, or if they have any other concerns.   The patient was instructed to remove packing at home in 48 hours and return to the Emergency Department or go to their PCP at that time for a recheck if their symptoms are not improving.  Follow-up  instructions discussed: Patient encouraged to follow-up with their PCP as needed.                           Medical Decision Making Risk OTC drugs. Prescription drug management.   Right buttocks cutaneous abscess with mild surrounding cellulitis.  No signs of sepsis.  No concern for necrotizing fasciitis.        Final Clinical Impression(s) / ED Diagnoses Final diagnoses:  Abscess of right buttock    Rx / DC Orders ED Discharge Orders          Ordered    doxycycline (VIBRAMYCIN) 100 MG capsule  2 times daily        11/14/21 2313              Carlisle Cater, PA-C 11/14/21 2321    Fransico Meadow, MD 11/15/21 [phone]

## 2021-11-14 NOTE — Discharge Instructions (Signed)
Please read and follow all provided instructions.  Your diagnoses today include:  1. Abscess of right buttock     Tests performed today include: Vital signs. See below for your results today.   Medications prescribed:  Doxycycline - antibiotic  You have been prescribed an antibiotic medicine: take the entire course of medicine even if you are feeling better. Stopping early can cause the antibiotic not to work.  Take any prescribed medications only as directed.   Home care instructions:  Follow any educational materials contained in this packet Remove the packing from the wound, by gently pulling on it.  Please do this on Thursday afternoon.  If it falls out sooner by accident, it is no problem.  Follow-up instructions: Return to the Emergency Department in 48 hours for a recheck if your symptoms are not significantly improved.  Please follow-up with your primary care provider in the next 1 week for further evaluation of your symptoms.   Return instructions:  Return to the Emergency Department if you have: Fever Worsening symptoms Worsening pain Worsening swelling Redness of the skin that moves away from the affected area, especially if it streaks away from the affected area  Any other emergent concerns   Your vital signs today were: BP 126/84 (BP Location: Right Arm)   Pulse (!) 103   Temp 98.7 F (37.1 C) (Oral)   Resp 18   Ht 5\' 4"  (1.626 m)   Wt 72 kg   SpO2 99%   BMI 27.25 kg/m  If your blood pressure (BP) was elevated above 135/85 this visit, please have this repeated by your doctor within one month. --------------

## 2021-11-15 ENCOUNTER — Other Ambulatory Visit: Payer: Self-pay

## 2021-12-14 ENCOUNTER — Ambulatory Visit (INDEPENDENT_AMBULATORY_CARE_PROVIDER_SITE_OTHER): Payer: Self-pay | Admitting: Primary Care

## 2021-12-21 ENCOUNTER — Other Ambulatory Visit: Payer: Self-pay

## 2021-12-21 MED ORDER — TAMSULOSIN HCL 0.4 MG PO CAPS
0.4000 mg | ORAL_CAPSULE | Freq: Every day | ORAL | 3 refills | Status: DC
  Filled 2021-12-21: qty 90, 90d supply, fill #0

## 2021-12-29 ENCOUNTER — Emergency Department (HOSPITAL_BASED_OUTPATIENT_CLINIC_OR_DEPARTMENT_OTHER)
Admission: EM | Admit: 2021-12-29 | Discharge: 2021-12-30 | Disposition: A | Discharge status: Home / Self Care | Payer: Self-pay | Attending: Emergency Medicine | Admitting: Emergency Medicine

## 2021-12-29 ENCOUNTER — Encounter (HOSPITAL_BASED_OUTPATIENT_CLINIC_OR_DEPARTMENT_OTHER): Payer: Self-pay | Admitting: *Deleted

## 2021-12-29 ENCOUNTER — Other Ambulatory Visit: Payer: Self-pay

## 2021-12-29 DIAGNOSIS — M7122 Synovial cyst of popliteal space [Baker], left knee: Secondary | ICD-10-CM | POA: Insufficient documentation

## 2021-12-29 DIAGNOSIS — Z7984 Long term (current) use of oral hypoglycemic drugs: Secondary | ICD-10-CM | POA: Insufficient documentation

## 2021-12-29 DIAGNOSIS — N3000 Acute cystitis without hematuria: Secondary | ICD-10-CM | POA: Insufficient documentation

## 2021-12-29 DIAGNOSIS — Z760 Encounter for issue of repeat prescription: Secondary | ICD-10-CM

## 2021-12-29 DIAGNOSIS — Z794 Long term (current) use of insulin: Secondary | ICD-10-CM | POA: Insufficient documentation

## 2021-12-29 DIAGNOSIS — R739 Hyperglycemia, unspecified: Secondary | ICD-10-CM

## 2021-12-29 DIAGNOSIS — L0291 Cutaneous abscess, unspecified: Secondary | ICD-10-CM

## 2021-12-29 DIAGNOSIS — E1165 Type 2 diabetes mellitus with hyperglycemia: Secondary | ICD-10-CM | POA: Insufficient documentation

## 2021-12-29 DIAGNOSIS — Z79899 Other long term (current) drug therapy: Secondary | ICD-10-CM | POA: Insufficient documentation

## 2021-12-29 DIAGNOSIS — I10 Essential (primary) hypertension: Secondary | ICD-10-CM | POA: Insufficient documentation

## 2021-12-29 LAB — URINALYSIS, ROUTINE W REFLEX MICROSCOPIC
Bilirubin Urine: NEGATIVE
Glucose, UA: >1000 mg/dL — AB
Ketones, ur: NEGATIVE mg/dL
Nitrite: NEGATIVE
Specific Gravity, Urine: 1.03 (ref 1.005–1.030)
pH: 5.5 (ref 5.0–8.0)

## 2021-12-29 LAB — CBC
HCT: 36.5 % — ABNORMAL LOW (ref 39.0–52.0)
Hemoglobin: 12.9 g/dL — ABNORMAL LOW (ref 13.0–17.0)
MCH: 29.7 pg (ref 26.0–34.0)
MCHC: 35.3 g/dL (ref 30.0–36.0)
MCV: 84.1 fL (ref 80.0–100.0)
Platelets: 274 10*3/uL (ref 150–400)
RBC: 4.34 MIL/uL (ref 4.22–5.81)
RDW: 12.2 % (ref 11.5–15.5)
WBC: 9.2 10*3/uL (ref 4.0–10.5)
nRBC: 0 % (ref 0.0–0.2)

## 2021-12-29 LAB — BASIC METABOLIC PANEL
Anion gap: 14 (ref 5–15)
BUN: 29 mg/dL — ABNORMAL HIGH (ref 6–20)
CO2: 17 mmol/L — ABNORMAL LOW (ref 22–32)
Calcium: 10.2 mg/dL (ref 8.9–10.3)
Chloride: 98 mmol/L (ref 98–111)
Creatinine, Ser: 1.18 mg/dL (ref 0.61–1.24)
GFR, Estimated: >60 mL/min (ref >60)
Glucose, Bld: 598 mg/dL (ref 70–99)
Potassium: 4.3 mmol/L (ref 3.5–5.1)
Sodium: 129 mmol/L — ABNORMAL LOW (ref 135–145)

## 2021-12-29 LAB — CBG MONITORING, ED
Glucose-Capillary: 552 mg/dL (ref 70–99)
Glucose-Capillary: 432 mg/dL — ABNORMAL HIGH (ref 70–99)

## 2021-12-29 MED ORDER — SODIUM CHLORIDE 0.9 % IV SOLN
1.0000 g | Freq: Once | INTRAVENOUS | Status: AC
  Administered 2021-12-29: 1 g via INTRAVENOUS
  Filled 2021-12-29: qty 10

## 2021-12-29 MED ORDER — DOXYCYCLINE HYCLATE 100 MG PO TABS
100.0000 mg | ORAL_TABLET | Freq: Once | ORAL | Status: AC
  Administered 2021-12-29: 100 mg via ORAL
  Filled 2021-12-29: qty 1

## 2021-12-29 MED ORDER — SODIUM CHLORIDE 0.9 % IV BOLUS
1000.0000 mL | Freq: Once | INTRAVENOUS | Status: AC
  Administered 2021-12-29: 1000 mL via INTRAVENOUS

## 2021-12-29 MED ORDER — METFORMIN HCL ER 500 MG PO TB24
1000.0000 mg | ORAL_TABLET | Freq: Two times a day (BID) | ORAL | 2 refills | Status: DC
  Filled 2021-12-29: qty 120, 30d supply, fill #0

## 2021-12-29 MED ORDER — INSULIN ASPART 100 UNIT/ML IJ SOLN
6.0000 [IU] | INTRAMUSCULAR | Status: AC
  Administered 2021-12-29: 6 [IU] via SUBCUTANEOUS

## 2021-12-29 MED ORDER — LIDOCAINE HCL (PF) 1 % IJ SOLN
30.0000 mL | Freq: Once | INTRAMUSCULAR | Status: AC
  Administered 2021-12-29: 30 mL via INTRADERMAL
  Filled 2021-12-29: qty 30

## 2021-12-29 NOTE — ED Notes (Signed)
Pt urinated 50cc of urine.

## 2021-12-29 NOTE — ED Notes (Signed)
ED Provider at bedside. 

## 2021-12-29 NOTE — ED Triage Notes (Addendum)
Pt has red, raised, warm and painful area behind left knee.  Pt states that this began Saturday before last and has increased.  Not draining.  Pt denies any fever or chills.  Pt has not had this before. Pt reports that his CBG has been WNL, this am he reports it was 180.  CBG in triage 552.

## 2021-12-29 NOTE — ED Notes (Signed)
CRITICAL VALUE STICKER  CRITICAL VALUE: [phone]  RECEIVER (on-site recipient of call):I. Eliette Drumwright  DATE & TIME NOTIFIED: 12/29/21 1637  MESSENGER (representative from lab):Lab  MD NOTIFIED: Sharlett Iles  TIME OF NOTIFICATION:1637  RESPONSE:

## 2021-12-29 NOTE — ED Provider Notes (Signed)
[facility] EM[facility] DEPT Provider Note   CSN: [id number] Arrival date & time: 12/29/21  1538     History {Add pertinent medical, surgical, social history, OB history to HPI:1} Chief Complaint  Patient presents with   Cellulitis   Hyperglycemia    Joseph Roberts is a 61 y.o. male.  Saturday before last swelling in back of knee There was a foley bag attached ot his leg, says it was too tight against his leg Says that it does hurt - no pain medications Denies fevers, chills No abx for it yet Takes insulin for his DM (metformin and once a week injection but unsure). Says glucose at home was 180. No thirst, SOB, or increased urination.  Says he had the catheter for urinary retention but was removed yesterday. Is having pain with urination. Some retention.    Past Medical History:  Diagnosis Date   Diabetes mellitus 2011   High triglycerides    Hypertension       Home Medications Prior to Admission medications   Medication Sig Start Date End Date Taking? Authorizing Provider  Blood Glucose Monitoring Suppl (ONE TOUCH ULTRA MINI) W/DEVICE KIT 1 each by Does not apply route daily. Test fasting daily    [provider]  Blood Glucose Monitoring Suppl (TRUE METRIX METER) w/Device KIT Use as instructed. Check blood glucose level by fingerstick three times per day. 05/22/19   Gildardo Pounds, NP  CETIRIZINE HCL PO Take by mouth daily.    [provider]  doxycycline (VIBRA-TABS) 100 MG tablet Take 1 tablet (100 mg total) by mouth 2 (two) times daily. 11/14/21   Carlisle Cater, PA-C  Dulaglutide (TRULICITY) 3 JG/2.8ZM SOPN Inject 3 mg as directed once a week. 09/12/21   Kerin Perna, NP  gemfibrozil (LOPID) 600 MG tablet Take 1 tablet (600 mg total) by mouth 2 (two) times daily before a meal. 09/29/21   Kerin Perna, NP  glucose blood (TRUE METRIX BLOOD GLUCOSE TEST) test strip Use as instructed. Check blood glucose level by  fingerstick three times per day. 08/11/21   Charlott Rakes, MD  Insulin Glargine (BASAGLAR KWIKPEN) 100 UNIT/ML Inject 14 Units into the skin 2 (two) times daily. 09/12/21   Kerin Perna, NP  Insulin Pen Needle 31G X 5 MM MISC use as directed to inject insulin 4 times daily 08/29/20   Kerin Perna, NP  Insulin Syringe-Needle U-100 (TRUEPLUS INSULIN SYRINGE) 31G X 5/16" 0.3 ML MISC Use to inject Humalog TID. 08/29/20   Charlott Rakes, MD  lisinopril (ZESTRIL) 5 MG tablet Take 1 tablet (5 mg total) by mouth daily. 09/12/21   Kerin Perna, NP  meloxicam (MOBIC) 7.5 MG tablet Take 1 tablet (7.5 mg total) by mouth daily. 11/21/20   Kerin Perna, NP  metFORMIN (GLUCOPHAGE-XR) 500 MG 24 hr tablet Take 2 tablets (1,000 mg total) by mouth 2 (two) times daily. 09/12/21   Kerin Perna, NP  tamsulosin (FLOMAX) 0.4 MG CAPS capsule Take 1 capsule by mouth once daily 08/25/21     tamsulosin (FLOMAX) 0.4 MG CAPS capsule Take 1 capsule (0.4 mg total) by mouth daily. 12/21/21   Josph Macho, MD  TRUEplus Lancets 28G MISC Use as instructed. Check blood glucose level by fingerstick three times per day. 12/01/19   Gildardo Pounds, NP      Allergies    Patient has no known allergies.    Review of Systems   Review of Systems  Physical Exam Updated  Vital Signs BP 120/84 (BP Location: Right Arm)   Pulse 78   Temp 97.8 F (36.6 C)   Resp 16   SpO2 100%  Physical Exam  ED Results / Procedures / Treatments   Labs (all labs ordered are listed, but only abnormal results are displayed) Labs Reviewed  BASIC METABOLIC PANEL - Abnormal; Notable for the following components:      Result Value   Sodium 129 (*)    CO2 17 (*)    Glucose, Bld 598 (*)    BUN 29 (*)    All other components within normal limits  CBC - Abnormal; Notable for the following components:   Hemoglobin 12.9 (*)    HCT 36.5 (*)    All other components within normal limits  URINALYSIS, ROUTINE W REFLEX  MICROSCOPIC - Abnormal; Notable for the following components:   APPearance HAZY (*)    Glucose, UA >1,000 (*)    Hgb urine dipstick LARGE (*)    Protein, ur TRACE (*)    Leukocytes,Ua MODERATE (*)    Bacteria, UA MANY (*)    All other components within normal limits  CBG MONITORING, ED - Abnormal; Notable for the following components:   Glucose-Capillary 552 (*)    All other components within normal limits    EKG None  Radiology No results found.  Procedures Procedures  {Document cardiac monitor, telemetry assessment procedure when appropriate:1}  Medications Ordered in ED Medications - No data to display  ED Course/ Medical Decision Making/ A&P                           Medical Decision Making Amount and/or Complexity of Data Reviewed Labs: ordered.   ***  {Document critical care time when appropriate:1} {Document review of labs and clinical decision tools ie heart score, Chads2Vasc2 etc:1}  {Document your independent review of radiology images, and any outside records:1} {Document your discussion with family members, caretakers, and with consultants:1} {Document social determinants of health affecting pt's care:1} {Document your decision making why or why not admission, treatments were needed:1} Final Clinical Impression(s) / ED Diagnoses Final diagnoses:  None    Rx / DC Orders ED Discharge Orders     None

## 2021-12-30 ENCOUNTER — Other Ambulatory Visit: Payer: Self-pay

## 2021-12-30 LAB — CBG MONITORING, ED: Glucose-Capillary: 348 mg/dL — ABNORMAL HIGH (ref 70–99)

## 2021-12-30 MED ORDER — METFORMIN HCL ER 500 MG PO TB24: 1000.0000 mg | ORAL_TABLET | Freq: Two times a day (BID) | ORAL | 2 refills | Status: DC

## 2021-12-30 MED ORDER — SULFAMETHOXAZOLE-TRIMETHOPRIM 800-160 MG PO TABS: 1.0000 | ORAL_TABLET | Freq: Two times a day (BID) | ORAL | 0 refills | Status: AC

## 2021-12-30 NOTE — ED Notes (Signed)
Pt verbalizes understanding of discharge instructions. Opportunity for questioning and answers were provided. Pt discharged from ED to home with son.    

## 2021-12-30 NOTE — Discharge Instructions (Signed)
[name] lo atendieron en el departamento de emergencias por un absceso en la pierna y [name] infeccin del tracto urinario.  En urgencias le drenaron el absceso y le dieron antibiticos. Su nivel de azcar en sangre tambin fue tratado porque estaba elevado.  En casa, tome los antibiticos que le hemos recetado para tratar la infeccin del tracto urinario y [name].  Haga un seguimiento con su mdico de Midwife en 2-3 das con respecto a su visita y su nivel de [name].  Regrese inmediatamente al departamento de emergencias si experimenta alguno de los siguientes sntomas: fiebre, sangrado del absceso, dolor en el costado o cualquier otro sntoma preocupante.  Gracias por visitar nuestro Departamento de Emergencias. Fue un placer atenderte hoy.  ----  Today you were seen in the emergency department for your leg abscess and urinary tract infection.    In the emergency department your abscess was drained and you were given antibiotics. Your blood sugar was also treated because it was elevated.    At home, please take the antibiotics we have prescribed you to treat your urinary tract infection and abscess.    Follow-up with your primary doctor in 2-3 days regarding your visit and blood sugar.  Return immediately to the emergency department if you experience any of the following: fevers, bleeding from your abscess, flank pain, or any other concerning symptoms.    Thank you for visiting our Emergency Department. It was a pleasure taking care of you today.

## 2022-01-01 ENCOUNTER — Other Ambulatory Visit: Payer: Self-pay

## 2022-01-02 LAB — URINE CULTURE: Culture: 50000 — AB

## 2022-01-03 ENCOUNTER — Telehealth (HOSPITAL_BASED_OUTPATIENT_CLINIC_OR_DEPARTMENT_OTHER): Payer: Self-pay | Admitting: *Deleted

## 2022-01-03 NOTE — Progress Notes (Signed)
ED Antimicrobial Stewardship Positive Culture Follow Up   Joseph Roberts is an 61 y.o. male who presented to [facility] on 12/29/2021 with a chief complaint of  Chief Complaint  Patient presents with   Cellulitis   Hyperglycemia    Recent Results (from the past 720 hour(s))  Urine Culture     Status: Abnormal   Collection Time: 12/29/21  6:00 PM   Specimen: Urine, Clean Catch  Result Value Ref Range Status   Specimen Description   Final    URINE, CLEAN CATCH Performed at [facility], [address], [place], [place] [address]    Special Requests   Final    NONE Performed at [facility], [address], [place], [place] [address]    Culture (A)  Final    50,000 COLONIES/mL KLEBSIELLA OXYTOCA 80,000 COLONIES/mL ESCHERICHIA COLI    Report Status 01/02/2022 FINAL  Final   Organism ID, Bacteria ESCHERICHIA COLI (A)  Final   Organism ID, Bacteria KLEBSIELLA OXYTOCA (A)  Final      Susceptibility   Escherichia coli - MIC*    AMPICILLIN >=32 RESISTANT Resistant     CEFAZOLIN 16 SENSITIVE Sensitive     CEFEPIME <=0.12 SENSITIVE Sensitive     CEFTRIAXONE <=0.25 SENSITIVE Sensitive     CIPROFLOXACIN >=4 RESISTANT Resistant     GENTAMICIN <=1 SENSITIVE Sensitive     IMIPENEM <=0.25 SENSITIVE Sensitive     NITROFURANTOIN <=16 SENSITIVE Sensitive     TRIMETH/SULFA >=320 RESISTANT Resistant     AMPICILLIN/SULBACTAM >=32 RESISTANT Resistant     PIP/TAZO <=4 SENSITIVE Sensitive     * 80,000 COLONIES/mL ESCHERICHIA COLI   Klebsiella oxytoca - MIC*    AMPICILLIN >=32 RESISTANT Resistant     CEFAZOLIN 8 SENSITIVE Sensitive     CEFEPIME <=0.12 SENSITIVE Sensitive     CEFTRIAXONE <=0.25 SENSITIVE Sensitive     CIPROFLOXACIN <=0.25 SENSITIVE Sensitive     GENTAMICIN >=16 RESISTANT Resistant     IMIPENEM 0.5 SENSITIVE Sensitive     NITROFURANTOIN 32 SENSITIVE Sensitive     TRIMETH/SULFA >=320 RESISTANT Resistant     AMPICILLIN/SULBACTAM  16 INTERMEDIATE Intermediate     PIP/TAZO <=4 SENSITIVE Sensitive     * 50,000 COLONIES/mL KLEBSIELLA OXYTOCA    [x]  Treated with Bactrim, organisms resistant to prescribed antimicrobial.   Patient's chief complaint was a skin abscess which was drained in the ED.  Also was having urinary symptoms.   New antibiotic prescription: Continue bactrim for cellulitis.  Add cefadroxil 1g po BID x 7 days for UTI  ED Provider: Lacretia Leigh, MD   Candie Mile 01/03/2022, 9:07 AM Clinical Pharmacist Monday - Friday phone -  [phone] Saturday - Sunday phone - [phone]

## 2022-01-03 NOTE — Telephone Encounter (Signed)
Post ED Visit - Positive Culture Follow-up: Unsuccessful Patient Follow-up  Culture assessed and recommendations reviewed by:  []  Elenor Quinones, Pharm.D. []  Heide Guile, Pharm.D., BCPS AQ-ID []  Parks Neptune, Pharm.D., BCPS []  Alycia Rossetti, Pharm.D., BCPS []  [place], Pharm.D., BCPS, AAHIVP []  Legrand Como, Pharm.D., BCPS, AAHIVP []  Wynell Balloon, PharmD []  Vincenza Hews, PharmD, BCPS  Positive urine culture  [x]  Patient discharged without antimicrobial prescription and treatment is now indicated []  Organism is resistant to prescribed ED discharge antimicrobial []  Patient with positive blood cultures  Continue TMP/SMZ and add Cefadroxil 1g PO BID x 7 days, Lacretia Leigh, MD  Unable to contact patient after 3 attempts, letter will be sent to address on file  Ardeen Fillers 01/03/2022, 9:28 AM

## 2022-01-04 ENCOUNTER — Ambulatory Visit (INDEPENDENT_AMBULATORY_CARE_PROVIDER_SITE_OTHER): Payer: Self-pay | Admitting: Primary Care

## 2022-01-04 ENCOUNTER — Other Ambulatory Visit: Payer: Self-pay

## 2022-01-04 ENCOUNTER — Encounter (INDEPENDENT_AMBULATORY_CARE_PROVIDER_SITE_OTHER): Payer: Self-pay | Admitting: Primary Care

## 2022-01-04 VITALS — BP 127/77 | HR 82 | Resp 16 | Wt 156.4 lb

## 2022-01-04 DIAGNOSIS — E1165 Type 2 diabetes mellitus with hyperglycemia: Secondary | ICD-10-CM

## 2022-01-04 DIAGNOSIS — Z760 Encounter for issue of repeat prescription: Secondary | ICD-10-CM

## 2022-01-04 DIAGNOSIS — Z09 Encounter for follow-up examination after completed treatment for conditions other than malignant neoplasm: Secondary | ICD-10-CM

## 2022-01-04 DIAGNOSIS — Z794 Long term (current) use of insulin: Secondary | ICD-10-CM

## 2022-01-04 LAB — POCT GLYCOSYLATED HEMOGLOBIN (HGB A1C): HbA1c, POC (controlled diabetic range): 13 % — AB (ref 0.0–7.0)

## 2022-01-04 MED ORDER — BASAGLAR KWIKPEN 100 UNIT/ML ~~LOC~~ SOPN
14.0000 [IU] | PEN_INJECTOR | Freq: Two times a day (BID) | SUBCUTANEOUS | 2 refills | Status: DC
  Filled 2022-01-04: qty 15, 54d supply, fill #0

## 2022-01-04 MED ORDER — METFORMIN HCL ER 500 MG PO TB24
1000.0000 mg | ORAL_TABLET | Freq: Two times a day (BID) | ORAL | 2 refills | Status: DC
  Filled 2022-01-04 – 2022-02-27 (×2): qty 120, 30d supply, fill #0
  Filled 2022-03-30: qty 120, 30d supply, fill #1
  Filled 2022-07-16: qty 120, 30d supply, fill #2

## 2022-01-04 MED ORDER — TRULICITY 3 MG/0.5ML ~~LOC~~ SOAJ
3.0000 mg | SUBCUTANEOUS | 3 refills | Status: DC
  Filled 2022-01-04 – 2022-07-16 (×3): qty 2, 28d supply, fill #0
  Filled 2022-08-30 (×2): qty 2, 28d supply, fill #1
  Filled 2022-10-18: qty 2, 28d supply, fill #2

## 2022-01-04 NOTE — Patient Instructions (Signed)
Diabetes mellitus y nutricin, en adultos Diabetes Mellitus and Nutrition, Adult Si sufre de diabetes, o diabetes mellitus, es muy importante tener hbitos alimenticios saludables debido a que sus niveles de azcar en la sangre (glucosa) se ven afectados en gran medida por lo que come y bebe. Comer alimentos saludables en las cantidades correctas, aproximadamente a la misma hora todos los das, lo ayudar a: Controlar su glucemia. Disminuir el riesgo de sufrir una enfermedad cardaca. Mejorar la presin arterial. Alcanzar o mantener un peso saludable. Qu puede afectar mi plan de alimentacin? Todas las personas que sufren de diabetes son diferentes y cada una tiene necesidades diferentes en cuanto a un plan de alimentacin. El mdico puede recomendarle que trabaje con un nutricionista para elaborar el mejor plan para usted. Su plan de alimentacin puede variar segn factores como: Las caloras que necesita. Los medicamentos que toma. Su peso. Sus niveles de glucemia, presin arterial y colesterol. Su nivel de actividad. Otras afecciones que tenga, como enfermedades cardacas o renales. Cmo me afectan los carbohidratos? Los carbohidratos, o hidratos de carbono, afectan su nivel de glucemia ms que cualquier otro tipo de alimento. La ingesta de carbohidratos aumenta la cantidad de glucosa en la sangre. Es importante conocer la cantidad de carbohidratos que se pueden ingerir en cada comida sin correr ningn riesgo. Esto es diferente en cada persona. Su nutricionista puede ayudarlo a calcular la cantidad de carbohidratos que debe ingerir en cada comida y en cada refrigerio. Cmo me afecta el alcohol? El alcohol puede provocar una disminucin de la glucemia (hipoglucemia), especialmente si usa insulina o toma determinados medicamentos por va oral para la diabetes. La hipoglucemia es una afeccin potencialmente mortal. Los sntomas de la hipoglucemia, como somnolencia, mareos y confusin, son  similares a los sntomas de haber consumido demasiado alcohol. No beba alcohol si: Su mdico le indica no hacerlo. Est embarazada, puede estar embarazada o est tratando de quedar embarazada. Si bebe alcohol: Limite la cantidad que bebe a lo siguiente: De 0 a 1 medida por da para las mujeres. De 0 a 2 medidas por da para los hombres. Sepa cunta cantidad de alcohol hay en las bebidas que toma. En los [place], una medida equivale a una botella de cerveza de 12 oz (355 ml), un vaso de vino de 5 oz (148 ml) o un vaso de una bebida alcohlica de alta graduacin de 1 oz (44 ml). Mantngase hidratado bebiendo agua, refrescos dietticos o t helado sin azcar. Tenga en cuenta que los refrescos comunes, los jugos y otras bebidas para mezclar pueden contener mucha azcar y se deben contar como carbohidratos. Consejos para seguir este plan  Leer las etiquetas de los alimentos Comience por leer el tamao de la porcin en la etiqueta de Informacin nutricional de los alimentos envasados y las bebidas. La cantidad de caloras, carbohidratos, grasas y otros nutrientes detallados en la etiqueta se basan en una porcin del alimento. Muchos alimentos contienen ms de una porcin por envase. Verifique la cantidad total de gramos (g) de carbohidratos totales en una porcin. Verifique la cantidad de gramos de grasas saturadas y grasas trans en una porcin. Escoja alimentos que no contengan estas grasas o que su contenido de estas sea bajo. Verifique la cantidad de miligramos (mg) de sal (sodio) en una porcin. La mayora de las personas deben limitar la ingesta de sodio total a menos de 2300 mg por da. Siempre consulte la informacin nutricional de los alimentos etiquetados como "con bajo contenido de grasa" o "sin grasa".   Estos alimentos pueden tener un mayor contenido de azcar agregada o carbohidratos refinados, y deben evitarse. Hable con su nutricionista para identificar sus objetivos diarios en  cuanto a los nutrientes mencionados en la etiqueta. Al ir de compras Evite comprar alimentos procesados, enlatados o precocidos. Estos alimentos tienden a tener una mayor cantidad de grasa, sodio y azcar agregada. Compre en la zona exterior de la tienda de comestibles. Esta es la zona donde se encuentran con mayor frecuencia las frutas y las verduras frescas, los cereales a granel, las carnes frescas y los productos lcteos frescos. Al cocinar Use mtodos de coccin a baja temperatura, como hornear, en lugar de mtodos de coccin a alta temperatura, como frer en abundante aceite. Cocine con aceites saludables, como el aceite de oliva, canola o girasol. Evite cocinar con manteca, crema o carnes con alto contenido de grasa. Planificacin de las comidas Coma las comidas y los refrigerios regularmente, preferentemente a la misma hora todos los das. Evite pasar largos perodos de tiempo sin comer. Consuma alimentos ricos en fibra, como frutas frescas, verduras, frijoles y cereales integrales. Consuma entre 4 y 6 onzas (entre 112 y 168 g) de protenas magras por da, como carnes magras, pollo, pescado, huevos o tofu. Una onza (oz) (28 g) de protena magra equivale a: 1 onza (28 g) de carne, pollo o pescado. 1 huevo.  taza (62 g) de tofu. Coma algunos alimentos por da que contengan grasas saludables, como aguacates, frutos secos, semillas y pescado. Qu alimentos debo comer? Frutas Bayas. Manzanas. Naranjas. Duraznos. Damascos. Ciruelas. Uvas. Mangos. Papayas. Granadas. Kiwi. Cerezas. Verduras Verduras de hoja verde, que incluyen lechuga, espinaca, col rizada, acelga, hojas de berza, hojas de mostaza y repollo. Remolachas. Coliflor. Brcoli. Zanahorias. Judas verdes. Tomates. Pimientos. Cebollas. Pepinos. Coles de Bruselas. Granos Granos integrales, como panes, galletas, tortillas, cereales y pastas de salvado o integrales. Avena sin azcar. Quinua. Arroz integral o salvaje. Carnes y otras  protenas Frutos de mar. Carne de ave sin piel. Cortes magros de ave y carne de res. Tofu. Frutos secos. Semillas. Lcteos Productos lcteos sin grasa o con bajo contenido de grasa, como leche, yogur y queso. Es posible que los productos detallados arriba no constituyan una lista completa de los alimentos y las bebidas que puede tomar. Consulte a un nutricionista para obtener ms informacin. Qu alimentos debo evitar? Frutas Frutas enlatadas al almbar. Verduras Verduras enlatadas. Verduras congeladas con mantequilla o salsa de crema. Granos Productos elaborados con harina y harina blanca refinada, como panes, pastas, bocadillos y cereales. Evite todos los alimentos procesados. Carnes y otras protenas Cortes de carne con alto contenido de grasa. Carne de ave con piel. Carnes empanizadas o fritas. Carne procesada. Evite las grasas saturadas. Lcteos Yogur, queso o leche enteros. Bebidas Bebidas azucaradas, como gaseosas o t helado. Es posible que los productos que se enumeran ms arriba no constituyan una lista completa de los alimentos y las bebidas que debe evitar. Consulte a un nutricionista para obtener ms informacin. Preguntas para hacerle al mdico Debo consultar con un especialista certificado en atencin y educacin sobre la diabetes? Es necesario que me rena con un nutricionista? A qu nmero puedo llamar si tengo preguntas? Cules son los mejores momentos para controlar la glucemia? Dnde encontrar ms informacin: American Diabetes Association (Asociacin Estadounidense de la Diabetes): diabetes.org Academy of Nutrition and Dietetics (Academia de Nutricin y Diettica): eatright.org National Institute of Diabetes and Digestive and Kidney Diseases (Instituto Nacional de la Diabetes y las Enfermedades Digestivas y Renales): niddk.nih.gov Association of Diabetes   Care & Education Specialists (Asociacin de Especialistas en Atencin y Educacin sobre la Diabetes):  diabeteseducator.org Resumen Es importante tener hbitos alimenticios saludables debido a que sus niveles de azcar en la sangre (glucosa) se ven afectados en gran medida por lo que come y bebe. Es importante consumir alcohol con prudencia. Un plan de comidas saludable lo ayudar a controlar la glucosa en sangre y a reducir el riesgo de enfermedades cardacas. El mdico puede recomendarle que trabaje con un nutricionista para elaborar el mejor plan para usted. Esta informacin no tiene como fin reemplazar el consejo del mdico. Asegrese de hacerle al mdico cualquier pregunta que tenga. Document Revised: 10/28/2019 Document Reviewed: 10/28/2019 Elsevier Patient Education  2023 Elsevier Inc.  

## 2022-01-04 NOTE — Progress Notes (Addendum)
[facility]   Subjective:   [name]( interpreter Leandro Reasoner [phone]) presents for hospital follow up. Patient presented to the emergency room swelling, redness, and pain behind his left knee. Appropriately 2 weeks ago  urinary retention and had to have a Foley bag placed around his left leg. Thr f/c was removed but the pain remained. Admit date to the hospital was 12/29/21, patient was discharged from the hospital on 12/30/21, patient was admitted for: Hyperglycemia, Abscess and Acute cystitis without hematuria. Denies polyuria, phagia, dipsia or vision changes. Patient has No headache, No chest pain, No abdominal pain - No Nausea, No new weakness tingling or numbness, No Cough - shortness of breath  Past Medical History:  Diagnosis Date   Diabetes mellitus 2011   High triglycerides    Hypertension      No Known Allergies  Current Outpatient Medications on File Prior to Visit  Medication Sig Dispense Refill   Blood Glucose Monitoring Suppl (ONE TOUCH ULTRA MINI) W/DEVICE KIT 1 each by Does not apply route daily. Test fasting daily     Blood Glucose Monitoring Suppl (TRUE METRIX METER) w/Device KIT Use as instructed. Check blood glucose level by fingerstick three times per day. 1 kit 0   CETIRIZINE HCL PO Take by mouth daily.     Dulaglutide (TRULICITY) 3 JY/7.8GN SOPN Inject 3 mg as directed once a week. 2 mL 3   gemfibrozil (LOPID) 600 MG tablet Take 1 tablet (600 mg total) by mouth 2 (two) times daily before a meal. 180 tablet 1   glucose blood (TRUE METRIX BLOOD GLUCOSE TEST) test strip Use as instructed. Check blood glucose level by fingerstick three times per day. 100 each 12   Insulin Glargine (BASAGLAR KWIKPEN) 100 UNIT/ML Inject 14 Units into the skin 2 (two) times daily. 16 mL 2   Insulin Pen Needle 31G X 5 MM MISC use as directed to inject insulin 4 times daily 200 each 3   Insulin Syringe-Needle U-100 (TRUEPLUS INSULIN  SYRINGE) 31G X 5/16" 0.3 ML MISC Use to inject Humalog TID. 100 each 2   lisinopril (ZESTRIL) 5 MG tablet Take 1 tablet (5 mg total) by mouth daily. 90 tablet 1   meloxicam (MOBIC) 7.5 MG tablet Take 1 tablet (7.5 mg total) by mouth daily. 30 tablet 2   metFORMIN (GLUCOPHAGE-XR) 500 MG 24 hr tablet Take 2 tablets (1,000 mg total) by mouth 2 (two) times daily. 120 tablet 2   sulfamethoxazole-trimethoprim (BACTRIM DS) 800-160 MG tablet Take 1 tablet by mouth 2 (two) times daily for 7 days. 14 tablet 0   tamsulosin (FLOMAX) 0.4 MG CAPS capsule Take 1 capsule by mouth once daily 90 capsule 0   tamsulosin (FLOMAX) 0.4 MG CAPS capsule Take 1 capsule (0.4 mg total) by mouth daily. 90 capsule 3   TRUEplus Lancets 28G MISC Use as instructed. Check blood glucose level by fingerstick three times per day. 100 each 3   No current facility-administered medications on file prior to visit.     Review of System: Comprehensive ROS Pertinent positive and negative noted in HPI   Objective:  BP 127/77   Pulse 82   Resp 16   Wt 156 lb 6.4 oz (70.9 kg)   SpO2 97%   BMI 26.85 kg/m   Filed Weights   01/04/22 1400  Weight: 156 lb 6.4 oz (70.9 kg)    Physical Exam: General Appearance: Well nourished, in no apparent distress. Eyes: PERRLA,  EOMs, conjunctiva no swelling or erythema Sinuses: No Frontal/maxillary tenderness ENT/Mouth: Ext aud canals clear, TMs without erythema, bulging erythematous. Hearing normal.  Neck: Supple, thyroid normal.  Respiratory: Respiratory effort normal, BS equal bilaterally without rales, rhonchi, wheezing or stridor.  Cardio: RRR with no MRGs. Brisk peripheral pulses without edema.  Abdomen: Soft, + BS.  Non tender, no guarding, rebound, hernias, masses. Lymphatics: Non tender without lymphadenopathy.  Musculoskeletal: Full ROM, 5/5 strength, normal gait.  Skin: Warm, dry without rashes, lesions, ecchymosis.  Neuro: Cranial nerves intact. Normal muscle tone, no cerebellar  symptoms. Sensation intact.  Psych: Awake and oriented X 3, normal affect, Insight and Judgment appropriate.    Assessment:  Sair was seen today for hospitalization follow-up and diabetes.  Diagnoses and all orders for this visit:  Type 2 diabetes mellitus with hyperglycemia, with long-term current use of insulin (HCC) -     POCT glycosylated hemoglobin (Hb A1C) 13.0 Non compliant last medication pick up July 23 Discussed  co- morbidities with uncontrol diabetes  Complications -diabetic retinopathy, (close your eyes ? What do you see nothing) nephropathy decrease in kidney function- can lead to dialysis-on a machine 3 days a week to filter your kidney, neuropathy- numbness and tinging in your hands and feet,  increase risk of heart attack and stroke, and amputation due to decrease wound healing and circulation. Decrease your risk by taking medication daily as prescribed, monitor carbohydrates- foods that are high in carbohydrates are the following rice, potatoes, breads, sugars, and pastas.  Reduction in the intake (eating) will assist in lowering your blood sugars. Exercise daily at least 30 minutes daily.   Hospital discharge follow-up F/U 3 months and clinical pharmacist.    This note has been created with Dragon speech recognition software and smart phrase technology. Any transcriptional errors are unintentional.   Kerin Perna, NP 01/04/2022, 2:12 PM

## 2022-01-05 ENCOUNTER — Other Ambulatory Visit: Payer: Self-pay

## 2022-01-06 ENCOUNTER — Other Ambulatory Visit: Payer: Self-pay

## 2022-01-06 ENCOUNTER — Emergency Department (HOSPITAL_COMMUNITY)
Admission: EM | Admit: 2022-01-06 | Discharge: 2022-01-07 | Disposition: A | Discharge status: Home / Self Care | Payer: No Typology Code available for payment source | Attending: Emergency Medicine | Admitting: Emergency Medicine

## 2022-01-06 ENCOUNTER — Encounter (HOSPITAL_COMMUNITY): Payer: Self-pay | Admitting: Emergency Medicine

## 2022-01-06 DIAGNOSIS — Z794 Long term (current) use of insulin: Secondary | ICD-10-CM | POA: Insufficient documentation

## 2022-01-06 DIAGNOSIS — I10 Essential (primary) hypertension: Secondary | ICD-10-CM | POA: Insufficient documentation

## 2022-01-06 DIAGNOSIS — N179 Acute kidney failure, unspecified: Secondary | ICD-10-CM | POA: Insufficient documentation

## 2022-01-06 DIAGNOSIS — R339 Retention of urine, unspecified: Secondary | ICD-10-CM

## 2022-01-06 DIAGNOSIS — Z7984 Long term (current) use of oral hypoglycemic drugs: Secondary | ICD-10-CM | POA: Insufficient documentation

## 2022-01-06 DIAGNOSIS — N3001 Acute cystitis with hematuria: Secondary | ICD-10-CM | POA: Insufficient documentation

## 2022-01-06 DIAGNOSIS — E119 Type 2 diabetes mellitus without complications: Secondary | ICD-10-CM | POA: Insufficient documentation

## 2022-01-06 LAB — CBC WITH DIFFERENTIAL/PLATELET
Abs Immature Granulocytes: 0.05 10*3/uL (ref 0.00–0.07)
Basophils Absolute: 0.1 10*3/uL (ref 0.0–0.1)
Basophils Relative: 1 %
Eosinophils Absolute: 0.2 10*3/uL (ref 0.0–0.5)
Eosinophils Relative: 3 %
HCT: 31.6 % — ABNORMAL LOW (ref 39.0–52.0)
Hemoglobin: 10.6 g/dL — ABNORMAL LOW (ref 13.0–17.0)
Immature Granulocytes: 1 %
Lymphocytes Relative: 21 %
Lymphs Abs: 1.6 10*3/uL (ref 0.7–4.0)
MCH: 29.2 pg (ref 26.0–34.0)
MCHC: 33.5 g/dL (ref 30.0–36.0)
MCV: 87.1 fL (ref 80.0–100.0)
Monocytes Absolute: 0.7 10*3/uL (ref 0.1–1.0)
Monocytes Relative: 10 %
Neutro Abs: 4.8 10*3/uL (ref 1.7–7.7)
Neutrophils Relative %: 64 %
Platelets: 343 10*3/uL (ref 150–400)
RBC: 3.63 MIL/uL — ABNORMAL LOW (ref 4.22–5.81)
RDW: 13.1 % (ref 11.5–15.5)
WBC: 7.4 10*3/uL (ref 4.0–10.5)
nRBC: 0 % (ref 0.0–0.2)

## 2022-01-06 LAB — URINALYSIS, ROUTINE W REFLEX MICROSCOPIC
Bilirubin Urine: NEGATIVE
Glucose, UA: >=500 mg/dL — AB
Ketones, ur: NEGATIVE mg/dL
Nitrite: NEGATIVE
Protein, ur: 100 mg/dL — AB
RBC / HPF: >50 RBC/hpf — ABNORMAL HIGH (ref 0–5)
Specific Gravity, Urine: 1.015 (ref 1.005–1.030)
WBC, UA: >50 WBC/hpf — ABNORMAL HIGH (ref 0–5)
pH: 5 (ref 5.0–8.0)

## 2022-01-06 LAB — BASIC METABOLIC PANEL
Anion gap: 10 (ref 5–15)
BUN: 25 mg/dL — ABNORMAL HIGH (ref 6–20)
CO2: 17 mmol/L — ABNORMAL LOW (ref 22–32)
Calcium: 9 mg/dL (ref 8.9–10.3)
Chloride: 109 mmol/L (ref 98–111)
Creatinine, Ser: 1.32 mg/dL — ABNORMAL HIGH (ref 0.61–1.24)
GFR, Estimated: >60 mL/min (ref >60)
Glucose, Bld: 279 mg/dL — ABNORMAL HIGH (ref 70–99)
Potassium: 4.8 mmol/L (ref 3.5–5.1)
Sodium: 136 mmol/L (ref 135–145)

## 2022-01-06 NOTE — ED Triage Notes (Signed)
Pt reported to ED with c/o urinating in small amounts and blood in urine that he noticed shortly prior to arrival. States he has recently had a foley catheter for urinary retention.

## 2022-01-06 NOTE — ED Provider Triage Note (Cosign Needed Addendum)
Emergency Medicine Provider Triage Evaluation Note  Joseph Roberts , a 60 y.o. male  was evaluated in triage.  Pt complains of urinary retention.  States that he has developed pain in the suprapubic area with difficulty urinating.  States that he can only passed a small amount.  States that he did notice some "strings of blood" in the urine prior.  No flank or back pain.  No fever or vomiting.  Patient states that about 3 weeks ago he had a catheter placed at the urologist which was removed the following week.  He took a medication for an infection.  Spanish video interpreter needed/utilized.  9:34 PM Bladder scan = 960cc, foley catheter order placed.   Review of Systems  Positive: Urinary retention, suprapubic pain Negative: Flank pain, fever, vomiting  Physical Exam  BP 135/83 (BP Location: Right Arm)   Pulse 82   Temp 98 F (36.7 C) (Oral)   Resp 17   SpO2 96%  Gen:   Awake, no distress   Resp:  Normal effort  MSK:   Moves extremities without difficulty  Other:    Medical Decision Making  Medically screening exam initiated at 9:20 PM.  Appropriate orders placed.  Joseph Roberts was informed that the remainder of the evaluation will be completed by another provider, this initial triage assessment does not replace that evaluation, and the importance of remaining in the ED until their evaluation is complete.     Carlisle Cater, PA-C 01/06/22 2121    Carlisle Cater, PA-C 01/06/22 2135

## 2022-01-07 MED ORDER — CIPROFLOXACIN HCL 500 MG PO TABS
500.0000 mg | ORAL_TABLET | Freq: Two times a day (BID) | ORAL | 0 refills | Status: DC
  Filled 2022-01-07: qty 20, 10d supply, fill #0

## 2022-01-07 MED ORDER — CIPROFLOXACIN HCL 500 MG PO TABS
500.0000 mg | ORAL_TABLET | Freq: Once | ORAL | Status: AC
  Administered 2022-01-07: 500 mg via ORAL
  Filled 2022-01-07: qty 1

## 2022-01-07 MED ORDER — CIPROFLOXACIN HCL 500 MG PO TABS: 500.0000 mg | ORAL_TABLET | Freq: Two times a day (BID) | ORAL | 0 refills | Status: AC

## 2022-01-07 NOTE — Discharge Instructions (Signed)
You have been seen in the ED today for urinary retention. WE have placed a foley catheter. You should keep this in until you see urology. Please call Dr. Barbee Cough to schedule your appointment time. I have also started you on antibiotics for a urinary tract infection. I have given you the first dose here. Please pick this up from the pharmacy and take the next dose tonight before bed.

## 2022-01-07 NOTE — ED Provider Notes (Signed)
[place] [facility] EMERGENCY DEPARTMENT Provider Note   CSN: [id number] Arrival date & time: 01/06/22  2043     History PMH: HLD, GERD, DM2, HTN Chief Complaint  Patient presents with   Urinary Retention    Joseph Roberts is a 61 y.o. male.  Patient presents to the ED department with difficulty urinating.  He states that 3 weeks ago he was seen by urologist for urinary retention.  He was treated for an associated urinary tract infection with Bactrim which she completed the course for about 2 days ago.  He says that the urologist took out his catheter about two weeks ago and ever since his catheter was out, he has only been able to urinate small amounts.  He has noticed some strands of blood as well.  He says he is also noticed some suprapubic pressure that has been getting worse.  He denies any fevers, chills, chest pain, shortness of breath, nausea, vomiting, flank pain, testicular pain, scrotal swelling, or other skin changes  HPI     Home Medications Prior to Admission medications   Medication Sig Start Date End Date Taking? Authorizing Provider  ciprofloxacin (CIPRO) 500 MG tablet Take 1 tablet (500 mg total) by mouth every 12 (twelve) hours for 10 days. 01/07/22 01/17/22 Yes Paris Hohn, Adora Fridge, PA-C  Blood Glucose Monitoring Suppl (ONE TOUCH ULTRA MINI) W/DEVICE KIT 1 each by Does not apply route daily. Test fasting daily    [provider]  Blood Glucose Monitoring Suppl (TRUE METRIX METER) w/Device KIT Use as instructed. Check blood glucose level by fingerstick three times per day. 05/22/19   Gildardo Pounds, NP  CETIRIZINE HCL PO Take by mouth daily.    [provider]  Dulaglutide (TRULICITY) 3 SP/2.3RA SOPN Inject 3 mg as directed once a week. 01/04/22   Kerin Perna, NP  gemfibrozil (LOPID) 600 MG tablet Take 1 tablet (600 mg total) by mouth 2 (two) times daily before a meal. 09/29/21   Kerin Perna, NP  glucose blood (TRUE  METRIX BLOOD GLUCOSE TEST) test strip Use as instructed. Check blood glucose level by fingerstick three times per day. 08/11/21   Charlott Rakes, MD  Insulin Glargine (BASAGLAR KWIKPEN) 100 UNIT/ML Inject 14 Units into the skin 2 (two) times daily. 01/04/22   Kerin Perna, NP  Insulin Pen Needle 31G X 5 MM MISC use as directed to inject insulin 4 times daily 08/29/20   Kerin Perna, NP  Insulin Syringe-Needle U-100 (TRUEPLUS INSULIN SYRINGE) 31G X 5/16" 0.3 ML MISC Use to inject Humalog TID. 08/29/20   Charlott Rakes, MD  lisinopril (ZESTRIL) 5 MG tablet Take 1 tablet (5 mg total) by mouth daily. 09/12/21   Kerin Perna, NP  meloxicam (MOBIC) 7.5 MG tablet Take 1 tablet (7.5 mg total) by mouth daily. 11/21/20   Kerin Perna, NP  metFORMIN (GLUCOPHAGE-XR) 500 MG 24 hr tablet Take 2 tablets (1,000 mg total) by mouth 2 (two) times daily. 01/04/22   Kerin Perna, NP  tamsulosin (FLOMAX) 0.4 MG CAPS capsule Take 1 capsule by mouth once daily 08/25/21     tamsulosin (FLOMAX) 0.4 MG CAPS capsule Take 1 capsule (0.4 mg total) by mouth daily. 12/21/21   Josph Macho, MD  TRUEplus Lancets 28G MISC Use as instructed. Check blood glucose level by fingerstick three times per day. 12/01/19   Gildardo Pounds, NP      Allergies    Patient has no known allergies.  Review of Systems   Review of Systems  Genitourinary:  Positive for difficulty urinating and hematuria.  All other systems reviewed and are negative.   Physical Exam Updated Vital Signs BP (!) 143/97   Pulse 61   Temp (!) 97.5 F (36.4 C)   Resp 15   SpO2 100%  Physical Exam Vitals and nursing note reviewed.  Constitutional:      General: He is not in acute distress.    Appearance: Normal appearance. He is well-developed. He is not ill-appearing, toxic-appearing or diaphoretic.  HENT:     Head: Normocephalic and atraumatic.     Nose: No nasal deformity.     Mouth/Throat:     Lips: Pink. No lesions.   Eyes:     General: Gaze aligned appropriately. No scleral icterus.       Right eye: No discharge.        Left eye: No discharge.     Conjunctiva/sclera: Conjunctivae normal.     Right eye: Right conjunctiva is not injected. No exudate or hemorrhage.    Left eye: Left conjunctiva is not injected. No exudate or hemorrhage. Pulmonary:     Effort: Pulmonary effort is normal. No respiratory distress.     Breath sounds: No stridor.  Abdominal:     General: Abdomen is flat. There is no distension.     Palpations: Abdomen is soft. There is no mass.     Tenderness: There is no abdominal tenderness. There is no right CVA tenderness, left CVA tenderness, guarding or rebound.     Hernia: No hernia is present.  Musculoskeletal:     Right lower leg: No edema.     Left lower leg: No edema.  Skin:    General: Skin is warm and dry.  Neurological:     Mental Status: He is alert and oriented to person, place, and time.  Psychiatric:        Mood and Affect: Mood normal.        Speech: Speech normal.        Behavior: Behavior normal. Behavior is cooperative.     ED Results / Procedures / Treatments   Labs (all labs ordered are listed, but only abnormal results are displayed) Labs Reviewed  CBC WITH DIFFERENTIAL/PLATELET - Abnormal; Notable for the following components:      Result Value   RBC 3.63 (*)    Hemoglobin 10.6 (*)    HCT 31.6 (*)    All other components within normal limits  BASIC METABOLIC PANEL - Abnormal; Notable for the following components:   CO2 17 (*)    Glucose, Bld 279 (*)    BUN 25 (*)    Creatinine, Ser 1.32 (*)    All other components within normal limits  URINALYSIS, ROUTINE W REFLEX MICROSCOPIC - Abnormal; Notable for the following components:   APPearance CLOUDY (*)    Glucose, UA >=500 (*)    Hgb urine dipstick LARGE (*)    Protein, ur 100 (*)    Leukocytes,Ua LARGE (*)    RBC / HPF >50 (*)    WBC, UA >50 (*)    Bacteria, UA MANY (*)    All other  components within normal limits  URINE CULTURE    EKG None  Radiology No results found.  Procedures Procedures   Medications Ordered in ED Medications  ciprofloxacin (CIPRO) tablet 500 mg (has no administration in time range)    ED Course/ Medical Decision Making/ A&P  Medical Decision Making Amount and/or Complexity of Data Reviewed Labs: ordered.  Risk Prescription drug management.    MDM  This is a 61 y.o. male who presents to the ED with urinary retention The differential of this patient includes but is not limited to UTI, urinary obstruction, prostate concern, etc.   Initial Impression  Well appearing, no acute distress Stable vitals  I personally ordered, reviewed, and interpreted all laboratory work and imaging and agree with radiologist interpretation. Results interpreted below:  No leukocytosis, Hgb stable from prior, Creat 1.32 which is a very minimal elevation from prior lab 2 weeks ago. Glucose 279, but CO2 is chronically around 17 and there is no anion gap. Urine notable for large amt of RBC and WBC  Assessment/Plan:  He was initially seen by triage provider and had a bladder scan notable for 900 cc. He had a foley catheter placed with immediate relief of symptoms.  Labs revealed a very mild AKI likely caused by urinary retention. He is tolerating PO and obstruction has been bypassed with catheter so I do not feel that he needs admission for this. Urine also consistent with UTI. I have sent off a culture since this is the second time this has happened. Will treat with Cipro. He should follow up with Urology outpatient to have his catheter removed.    Charting Requirements Additional history is obtained from:  Independent historian External Records from outside source obtained and reviewed including: Prior creat value Social Determinants of Health:   Language barrier Pertinant PMH that complicates patient's illness: n/a  Patient  Care Problems that were addressed during this visit: - Urinary retention: Acute illness with complication - UTI: Acute illness with complication Medications given in ED: Cipro Reevaluation of the patient after these medicines showed that the patient stayed the same I have reviewed home medications and made changes accordingly.  Critical Care Interventions: n/a Consultations: n/a Disposition: discharge  Portions of this note were generated with Dragon dictation software. Dictation errors may occur despite best attempts at proofreading.    Final Clinical Impression(s) / ED Diagnoses Final diagnoses:  Urinary retention  Acute cystitis with hematuria    Rx / DC Orders ED Discharge Orders          Ordered    ciprofloxacin (CIPRO) 500 MG tablet  Every 12 hours        01/07/22 1159              Altie Savard, Cherlyn Roberts 01/07/22 1209    Lacretia Leigh, MD 01/08/22 [id number]

## 2022-01-07 NOTE — ED Notes (Signed)
Using interpreter, pt verbalized understanding of urology follow-up and prescriptions.

## 2022-01-08 ENCOUNTER — Other Ambulatory Visit: Payer: Self-pay

## 2022-01-09 LAB — URINE CULTURE: Culture: >=100000 — AB

## 2022-01-10 ENCOUNTER — Telehealth (HOSPITAL_BASED_OUTPATIENT_CLINIC_OR_DEPARTMENT_OTHER): Payer: Self-pay

## 2022-01-10 NOTE — Telephone Encounter (Signed)
Post ED Visit - Positive Culture Follow-up  Culture report reviewed by antimicrobial stewardship pharmacist: [facility] [x]  , Pharm.D. []  Georga Hacking, Pharm.D., BCPS AQ-ID []  , Pharm.D., BCPS []  Celedonio Miyamoto, .D., BCPS []  [place], .D., BCPS, AAHIVP []  Georgina Pillion, Pharm.D., BCPS, AAHIVP []  [address], PharmD, BCPS []  , PharmD, BCPS []  [place], PharmD, BCPS []  [place], PharmD []  , PharmD, BCPS []  Estella Husk, PharmD  Pharmacy Team []  Lysle Pearl, PharmD []  , PharmD []  Phillips Climes, PharmD []  , Rph []  Agapito Games) , PharmD []  Verlan Friends, PharmD []  , PharmD []  Mervyn Gay, PharmD []  , PharmD []  Vinnie Level, PharmD []  [facility], PharmD []  , PharmD []  Len Childs, PharmD   Positive urine culture Treated with Ciprofloxacin, organism sensitive to the same and no further patient follow-up is required at this time.  01/10/2022, 9:26 AM

## 2022-01-22 ENCOUNTER — Other Ambulatory Visit: Payer: Self-pay

## 2022-01-22 MED ORDER — OXYBUTYNIN CHLORIDE ER 5 MG PO TB24
5.0000 mg | ORAL_TABLET | Freq: Every day | ORAL | 11 refills | Status: DC
  Filled 2022-01-22: qty 30, 30d supply, fill #0
  Filled 2022-02-27: qty 30, 30d supply, fill #1

## 2022-01-22 MED ORDER — CEPHALEXIN 500 MG PO CAPS
500.0000 mg | ORAL_CAPSULE | Freq: Two times a day (BID) | ORAL | 0 refills | Status: DC
  Filled 2022-01-22: qty 10, 5d supply, fill #0

## 2022-02-01 ENCOUNTER — Other Ambulatory Visit: Payer: Self-pay

## 2022-02-01 MED ORDER — AMOXICILLIN-POT CLAVULANATE 500-125 MG PO TABS
1.0000 | ORAL_TABLET | Freq: Two times a day (BID) | ORAL | 0 refills | Status: DC
  Filled 2022-02-01: qty 10, 5d supply, fill #0

## 2022-02-07 ENCOUNTER — Other Ambulatory Visit: Payer: Self-pay

## 2022-02-12 ENCOUNTER — Ambulatory Visit: Payer: Self-pay | Attending: Primary Care | Admitting: Pharmacist

## 2022-02-12 ENCOUNTER — Other Ambulatory Visit: Payer: Self-pay

## 2022-02-12 DIAGNOSIS — Z794 Long term (current) use of insulin: Secondary | ICD-10-CM

## 2022-02-12 DIAGNOSIS — E1165 Type 2 diabetes mellitus with hyperglycemia: Secondary | ICD-10-CM

## 2022-02-12 DIAGNOSIS — Z760 Encounter for issue of repeat prescription: Secondary | ICD-10-CM

## 2022-02-12 MED ORDER — BASAGLAR KWIKPEN 100 UNIT/ML ~~LOC~~ SOPN
48.0000 [IU] | PEN_INJECTOR | Freq: Every day | SUBCUTANEOUS | 2 refills | Status: DC
  Filled 2022-02-12 – 2022-02-27 (×3): qty 15, 31d supply, fill #0
  Filled 2022-03-30: qty 15, 31d supply, fill #1
  Filled 2022-07-16: qty 15, 31d supply, fill #2

## 2022-02-12 NOTE — Progress Notes (Signed)
S:    No chief complaint on file.  61 y.o. male who presents for diabetes evaluation, education, and management. PMH is significant for T2DM, HTN, HLD, GERD, and urinary incontinence. Patient was referred and last seen by Primary Care Provider, Gwinda Passe, NP, on 01/04/2022. Last seen by pharmacy clinic in June 2023.   At last visit with PCP, A1c was elevated at 13% and it was noted that he had not picked up his medications since July 2023.   Today, patient arrives in good spirits and presents with the assistance of an online interpretor. Of note, patient endorses taking Basaglar 24 units BID (prescribed as 14 units BID). Denies GI side effects related to Trulicity.   Patient reports Diabetes was diagnosed >10 years ago.   Family/Social History:  -Current smoker -Mother, sister, and brother have diabetes  Current diabetes medications include: metformin 1000 mg BID, Basaglar 24 units BID, Trulicity 3 mg weekly (Sunday's) Current hypertension medications include: lisinopril 5 mg daily Current hyperlipidemia medications include: gemfibrozil 600 mg BID  Patient reports adherence to taking all medications as prescribed.   Insurance coverage: uninsured  Patient denies hypoglycemic events. Lowest reading was 79 last Wednesday but patient was not symptomatic.   Reported home fasting blood sugars: 160s, this morning was 138 mg/dL; 1 reading >030 ~2 weeks ago  Reported 2 hour post-meal/random blood sugars: doesn't check.  Patient reports nocturia (nighttime urination).  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 Small change since last visit. Patient reports self foot exams.   O:  7 day average blood glucose: did not bring to visit    Lab Results  Component Value Date   HGBA1C 13.0 (A) 01/04/2022   There were no vitals filed for this visit.  Lipid Panel     Component Value Date/Time   CHOL 159 09/12/2021 0923   TRIG 409 (H)  09/12/2021 0923   HDL 30 (L) 09/12/2021 0923   CHOLHDL 5.3 (H) 09/12/2021 0923   CHOLHDL 4.7 03/07/2016 1046   VLDL 61 (H) 03/07/2016 1046   LDLCALC 65 09/12/2021 0923    Clinical Atherosclerotic Cardiovascular Disease (ASCVD): No  The 10-year ASCVD risk score (Arnett DK, et al., 2019) is: 35.4%   Values used to calculate the score:     Age: 61 years     Sex: Male     Is Non-Hispanic African American: No     Diabetic: Yes     Tobacco smoker: Yes     Systolic Blood Pressure: 136 mmHg     Is BP treated: Yes     HDL Cholesterol: 30 mg/dL     Total Cholesterol: 159 mg/dL   A/P: Diabetes longstanding currently uncontrolled based on A1c. Patient is able to verbalize appropriate hypoglycemia management plan. Medication adherence appears appropriate. -Consolidate Basaglar (insulin glargine) from 24 units BID to 48 units ONCE daily. -Continued GLP-1 Trulicity 3 mg weekly. Patient receives through [facility] and has the medication shipped to his home. -Defer initiation of  SGLT2-I given recent UTI and urinary incontinence.  -Continued metformin 1000 mg BID.  -Extensively discussed pathophysiology of diabetes, recommended lifestyle interventions, dietary effects on blood sugar control.  -Counseled on s/sx of and management of hypoglycemia.  -Next A1c anticipated February 2024.   ASCVD risk - primary prevention in patient with diabetes. Last LDL is at goal of <70 mg/dL (SPQ=33 on 0/09/6224). ASCVD risk factors include T2DM, HLD, current smoker and 10-year ASCVD risk score of 35.4%. Moderate intensity  statin indicated. Patient previously on atorvastatin 40 mg daily but was discontinued by PCP on 09/12/2021. No statin intolerance documented. Will assess at follow up if patient should restart atorvastatin.  -Continue gemfibrozil 600 mg BID (last triglyceride 409 on 09/12/2021).   Written patient instructions provided. Patient verbalized understanding of treatment plan.  Total time in face to face  counseling 25 minutes.    Follow-up:  Pharmacist 03/23/2022. PCP clinic visit in February 2024.  Valeda Malm, Pharm.D. PGY-2 Ambulatory Care Pharmacy Resident 02/12/2022 10:33 AM

## 2022-02-13 ENCOUNTER — Other Ambulatory Visit: Payer: Self-pay

## 2022-02-13 MED ORDER — LEVOFLOXACIN 750 MG PO TABS
750.0000 mg | ORAL_TABLET | Freq: Once | ORAL | 0 refills | Status: AC
  Filled 2022-02-13: qty 1, 1d supply, fill #0

## 2022-02-14 ENCOUNTER — Other Ambulatory Visit: Payer: Self-pay

## 2022-02-20 ENCOUNTER — Other Ambulatory Visit: Payer: Self-pay

## 2022-02-27 ENCOUNTER — Other Ambulatory Visit: Payer: Self-pay

## 2022-02-27 MED ORDER — TRAMADOL HCL 50 MG PO TABS
ORAL_TABLET | ORAL | 0 refills | Status: DC
  Filled 2022-02-27: qty 10, 2d supply, fill #0

## 2022-02-28 ENCOUNTER — Inpatient Hospital Stay (HOSPITAL_COMMUNITY)
Admission: EM | Admit: 2022-02-28 | Discharge: 2022-03-07 | DRG: 663 | Disposition: A | Discharge status: Home / Self Care | Payer: Self-pay | Attending: Internal Medicine | Admitting: Internal Medicine

## 2022-02-28 ENCOUNTER — Other Ambulatory Visit: Payer: Self-pay

## 2022-02-28 ENCOUNTER — Encounter (HOSPITAL_COMMUNITY): Payer: Self-pay

## 2022-02-28 DIAGNOSIS — Z79899 Other long term (current) drug therapy: Secondary | ICD-10-CM

## 2022-02-28 DIAGNOSIS — N421 Congestion and hemorrhage of prostate: Secondary | ICD-10-CM | POA: Diagnosis present

## 2022-02-28 DIAGNOSIS — E785 Hyperlipidemia, unspecified: Secondary | ICD-10-CM | POA: Diagnosis present

## 2022-02-28 DIAGNOSIS — Z794 Long term (current) use of insulin: Secondary | ICD-10-CM

## 2022-02-28 DIAGNOSIS — E781 Pure hyperglyceridemia: Secondary | ICD-10-CM | POA: Diagnosis present

## 2022-02-28 DIAGNOSIS — Y738 Miscellaneous gastroenterology and urology devices associated with adverse incidents, not elsewhere classified: Secondary | ICD-10-CM | POA: Diagnosis present

## 2022-02-28 DIAGNOSIS — D62 Acute posthemorrhagic anemia: Secondary | ICD-10-CM | POA: Diagnosis present

## 2022-02-28 DIAGNOSIS — N390 Urinary tract infection, site not specified: Secondary | ICD-10-CM | POA: Diagnosis present

## 2022-02-28 DIAGNOSIS — R310 Gross hematuria: Secondary | ICD-10-CM

## 2022-02-28 DIAGNOSIS — E1165 Type 2 diabetes mellitus with hyperglycemia: Secondary | ICD-10-CM | POA: Diagnosis present

## 2022-02-28 DIAGNOSIS — Z1152 Encounter for screening for COVID-19: Secondary | ICD-10-CM

## 2022-02-28 DIAGNOSIS — Z791 Long term (current) use of non-steroidal anti-inflammatories (NSAID): Secondary | ICD-10-CM

## 2022-02-28 DIAGNOSIS — I10 Essential (primary) hypertension: Secondary | ICD-10-CM | POA: Diagnosis present

## 2022-02-28 DIAGNOSIS — R319 Hematuria, unspecified: Principal | ICD-10-CM

## 2022-02-28 DIAGNOSIS — E871 Hypo-osmolality and hyponatremia: Secondary | ICD-10-CM | POA: Diagnosis present

## 2022-02-28 DIAGNOSIS — Z7984 Long term (current) use of oral hypoglycemic drugs: Secondary | ICD-10-CM

## 2022-02-28 DIAGNOSIS — F17210 Nicotine dependence, cigarettes, uncomplicated: Secondary | ICD-10-CM | POA: Diagnosis present

## 2022-02-28 DIAGNOSIS — B961 Klebsiella pneumoniae [K. pneumoniae] as the cause of diseases classified elsewhere: Secondary | ICD-10-CM | POA: Diagnosis present

## 2022-02-28 DIAGNOSIS — D649 Anemia, unspecified: Secondary | ICD-10-CM | POA: Insufficient documentation

## 2022-02-28 DIAGNOSIS — N179 Acute kidney failure, unspecified: Secondary | ICD-10-CM | POA: Diagnosis present

## 2022-02-28 DIAGNOSIS — Z833 Family history of diabetes mellitus: Secondary | ICD-10-CM

## 2022-02-28 DIAGNOSIS — Z7985 Long-term (current) use of injectable non-insulin antidiabetic drugs: Secondary | ICD-10-CM

## 2022-02-28 DIAGNOSIS — K219 Gastro-esophageal reflux disease without esophagitis: Secondary | ICD-10-CM | POA: Diagnosis present

## 2022-02-28 DIAGNOSIS — N400 Enlarged prostate without lower urinary tract symptoms: Secondary | ICD-10-CM | POA: Insufficient documentation

## 2022-02-28 DIAGNOSIS — T83091A Other mechanical complication of indwelling urethral catheter, initial encounter: Principal | ICD-10-CM | POA: Diagnosis present

## 2022-02-28 LAB — CBC WITH DIFFERENTIAL/PLATELET
Abs Immature Granulocytes: 0.21 10*3/uL — ABNORMAL HIGH (ref 0.00–0.07)
Basophils Absolute: 0.1 10*3/uL (ref 0.0–0.1)
Basophils Relative: 0 %
Eosinophils Absolute: 0 10*3/uL (ref 0.0–0.5)
Eosinophils Relative: 0 %
HCT: 31.7 % — ABNORMAL LOW (ref 39.0–52.0)
Hemoglobin: 10.5 g/dL — ABNORMAL LOW (ref 13.0–17.0)
Immature Granulocytes: 1 %
Lymphocytes Relative: 5 %
Lymphs Abs: 1.3 10*3/uL (ref 0.7–4.0)
MCH: 29.5 pg (ref 26.0–34.0)
MCHC: 33.1 g/dL (ref 30.0–36.0)
MCV: 89 fL (ref 80.0–100.0)
Monocytes Absolute: 1.1 10*3/uL — ABNORMAL HIGH (ref 0.1–1.0)
Monocytes Relative: 4 %
Neutro Abs: 22.9 10*3/uL — ABNORMAL HIGH (ref 1.7–7.7)
Neutrophils Relative %: 90 %
Platelets: 427 10*3/uL — ABNORMAL HIGH (ref 150–400)
RBC: 3.56 MIL/uL — ABNORMAL LOW (ref 4.22–5.81)
RDW: 14.6 % (ref 11.5–15.5)
WBC: 25.7 10*3/uL — ABNORMAL HIGH (ref 4.0–10.5)
nRBC: 0 % (ref 0.0–0.2)

## 2022-02-28 LAB — URINALYSIS, ROUTINE W REFLEX MICROSCOPIC

## 2022-02-28 LAB — URINALYSIS, MICROSCOPIC (REFLEX)
RBC / HPF: >50 RBC/hpf (ref 0–5)
Squamous Epithelial / HPF: NONE SEEN /HPF (ref 0–5)
WBC, UA: >50 WBC/hpf (ref 0–5)

## 2022-02-28 LAB — COMPREHENSIVE METABOLIC PANEL
ALT: 19 U/L (ref 0–44)
AST: 17 U/L (ref 15–41)
Albumin: 3.1 g/dL — ABNORMAL LOW (ref 3.5–5.0)
Alkaline Phosphatase: 120 U/L (ref 38–126)
Anion gap: 8 (ref 5–15)
BUN: 26 mg/dL — ABNORMAL HIGH (ref 6–20)
CO2: 18 mmol/L — ABNORMAL LOW (ref 22–32)
Calcium: 9 mg/dL (ref 8.9–10.3)
Chloride: 105 mmol/L (ref 98–111)
Creatinine, Ser: 1.36 mg/dL — ABNORMAL HIGH (ref 0.61–1.24)
GFR, Estimated: 60 mL/min — ABNORMAL LOW (ref >60)
Glucose, Bld: 178 mg/dL — ABNORMAL HIGH (ref 70–99)
Potassium: 4.3 mmol/L (ref 3.5–5.1)
Sodium: 131 mmol/L — ABNORMAL LOW (ref 135–145)
Total Bilirubin: 0.6 mg/dL (ref 0.3–1.2)
Total Protein: 7 g/dL (ref 6.5–8.1)

## 2022-02-28 LAB — LACTIC ACID, PLASMA
Lactic Acid, Venous: 1.7 mmol/L (ref 0.5–1.9)
Lactic Acid, Venous: 1 mmol/L (ref 0.5–1.9)

## 2022-02-28 MED ORDER — HYDROCODONE-ACETAMINOPHEN 5-325 MG PO TABS
1.0000 | ORAL_TABLET | Freq: Once | ORAL | Status: AC
  Administered 2022-02-28: 1 via ORAL
  Filled 2022-02-28: qty 1

## 2022-02-28 MED ORDER — ONDANSETRON 8 MG PO TBDP
8.0000 mg | ORAL_TABLET | Freq: Once | ORAL | Status: AC
  Administered 2022-02-28: 8 mg via ORAL
  Filled 2022-02-28: qty 1

## 2022-02-28 NOTE — ED Provider Notes (Signed)
[facility] DEPT Provider Note   CSN: [id number] Arrival date & time: 02/28/22  1717     History  Chief Complaint  Patient presents with   catheter clogged    Joseph Roberts is a 61 y.o. male who presents with concern for gross hematuria x 5 days.  Patient with history of prostatomegaly requiring Foley catheter for urinary retention.  States he had a cystoscopy 3 weeks ago which was normal, also states had a colonoscopy 1 week ago which was normal.  States that 5 days ago began to have gross blood in the urine with some clots.  Was seen his urologist yesterday with catheter replacement and irrigation.  No antibiotics were prescribed.  Patient has appointment tomorrow at 230 to follow-up with his urologist as well.  Endorses lower abdominal pain when his urine bag does not appear to be draining, resolution of pain when draining well but passing large clots per patient.  Endorses feeling weak since bleeding has been ongoing x 5 days.  Denies fevers at home.  I have reviewed his medical records; he has history of GERD, dyslipidemia, DMT2, HTN. No anticoagulation per patient.   HPI     Home Medications Prior to Admission medications   Medication Sig Start Date End Date Taking? Authorizing Provider  Blood Glucose Monitoring Suppl (ONE TOUCH ULTRA MINI) W/DEVICE KIT 1 each by Does not apply route daily. Test fasting daily    [provider]  Blood Glucose Monitoring Suppl (TRUE METRIX METER) w/Device KIT Use as instructed. Check blood glucose level by fingerstick three times per day. 05/22/19   Gildardo Pounds, NP  CETIRIZINE HCL PO Take by mouth daily.    [provider]  Dulaglutide (TRULICITY) 3 SE/8.3TD SOPN Inject 3 mg as directed once a week. 01/04/22   Kerin Perna, NP  gemfibrozil (LOPID) 600 MG tablet Take 1 tablet (600 mg total) by mouth 2 (two) times daily before a meal. 09/29/21   Kerin Perna, NP  glucose blood  (TRUE METRIX BLOOD GLUCOSE TEST) test strip Use as instructed. Check blood glucose level by fingerstick three times per day. 08/11/21   Charlott Rakes, MD  Insulin Glargine (BASAGLAR KWIKPEN) 100 UNIT/ML Inject 48 Units into the skin daily. 02/12/22   Charlott Rakes, MD  Insulin Pen Needle 31G X 5 MM MISC use as directed to inject insulin 4 times daily 08/29/20   Kerin Perna, NP  Insulin Syringe-Needle U-100 (TRUEPLUS INSULIN SYRINGE) 31G X 5/16" 0.3 ML MISC Use to inject Humalog TID. 08/29/20   Charlott Rakes, MD  lisinopril (ZESTRIL) 5 MG tablet Take 1 tablet (5 mg total) by mouth daily. 09/12/21   Kerin Perna, NP  meloxicam (MOBIC) 7.5 MG tablet Take 1 tablet (7.5 mg total) by mouth daily. 11/21/20   Kerin Perna, NP  metFORMIN (GLUCOPHAGE-XR) 500 MG 24 hr tablet Take 2 tablets (1,000 mg total) by mouth 2 (two) times daily. 01/04/22   Kerin Perna, NP  oxybutynin (DITROPAN-XL) 5 MG 24 hr tablet Take 1 tablet (5 mg total) by mouth daily. 01/22/22     tamsulosin (FLOMAX) 0.4 MG CAPS capsule Take 1 capsule by mouth once daily 08/25/21     tamsulosin (FLOMAX) 0.4 MG CAPS capsule Take 1 capsule (0.4 mg total) by mouth daily. 12/21/21   Josph Macho, MD  traMADol (ULTRAM) 50 MG tablet take 1-2 tablets by mouth every 6 hours as needed 02/27/22     TRUEplus Lancets 28G MISC  Use as instructed. Check blood glucose level by fingerstick three times per day. 12/01/19   Gildardo Pounds, NP      Allergies    Patient has no known allergies.    Review of Systems   Review of Systems  Constitutional:  Positive for fatigue.  Gastrointestinal:  Positive for abdominal pain. Negative for constipation.  Genitourinary:  Positive for hematuria, penile pain and testicular pain. Negative for decreased urine volume, penile discharge, penile swelling, scrotal swelling and urgency.    Physical Exam Updated Vital Signs BP 104/74   Pulse 72   Temp 98.3 F (36.8 C) (Oral)   Resp 16    Ht _0  (1.626 m)   Wt 74.4 kg   SpO2 98%   BMI 28.15 kg/m  Physical Exam Vitals and nursing note reviewed. Exam conducted with a chaperone present Optometrist).  Constitutional:      Appearance: He is not ill-appearing or toxic-appearing.  HENT:     Head: Normocephalic and atraumatic.     Nose: Nose normal.     Mouth/Throat:     Mouth: Mucous membranes are moist.     Pharynx: No oropharyngeal exudate or posterior oropharyngeal erythema.  Eyes:     General:        Right eye: No discharge.        Left eye: No discharge.     Extraocular Movements: Extraocular movements intact.     Conjunctiva/sclera: Conjunctivae normal.     Pupils: Pupils are equal, round, and reactive to light.  Cardiovascular:     Rate and Rhythm: Normal rate and regular rhythm.     Pulses: Normal pulses.     Heart sounds: Normal heart sounds. No murmur heard. Pulmonary:     Effort: Pulmonary effort is normal. No respiratory distress.     Breath sounds: Normal breath sounds. No wheezing or rales.  Abdominal:     General: Bowel sounds are normal. There is no distension.     Palpations: Abdomen is soft.     Tenderness: There is no abdominal tenderness. There is no guarding or rebound.  Genitourinary:    Penis: Normal.      Testes:        Right: Tenderness present. Mass, swelling, testicular hydrocele or varicocele not present. Right testis is descended. Cremasteric reflex is present.         Left: Mass, tenderness, swelling, testicular hydrocele or varicocele not present. Left testis is descended. Cremasteric reflex is present.      Epididymis:     Right: Normal.     Left: Normal.     Comments: Some purulence on the foley catheter, frank hematuria with passage of small clots per foley on exam. Irrigated by RN (please see her documentation) with flushing of small clots. Foley draining well at this time, abdominal pain resolved.  Musculoskeletal:        General: No deformity.     Cervical back: Neck supple.   Lymphadenopathy:     Cervical: No cervical adenopathy.  Skin:    General: Skin is warm and dry.     Capillary Refill: Capillary refill takes less than 2 seconds.  Neurological:     General: No focal deficit present.     Mental Status: He is alert and oriented to person, place, and time. Mental status is at baseline.  Psychiatric:        Mood and Affect: Mood normal.     ED Results / Procedures / Treatments  Labs (all labs ordered are listed, but only abnormal results are displayed) Labs Reviewed  CBC WITH DIFFERENTIAL/PLATELET - Abnormal; Notable for the following components:      Result Value   WBC 25.7 (*)    RBC 3.56 (*)    Hemoglobin 10.5 (*)    HCT 31.7 (*)    Platelets 427 (*)    Neutro Abs 22.9 (*)    Monocytes Absolute 1.1 (*)    Abs Immature Granulocytes 0.21 (*)    All other components within normal limits  COMPREHENSIVE METABOLIC PANEL - Abnormal; Notable for the following components:   Sodium 131 (*)    CO2 18 (*)    Glucose, Bld 178 (*)    BUN 26 (*)    Creatinine, Ser 1.36 (*)    Albumin 3.1 (*)    GFR, Estimated 60 (*)    All other components within normal limits  URINALYSIS, ROUTINE W REFLEX MICROSCOPIC - Abnormal; Notable for the following components:   Color, Urine RED (*)    APPearance TURBID (*)    Glucose, UA   (*)    Value: TEST NOT REPORTED DUE TO COLOR INTERFERENCE OF URINE PIGMENT   Hgb urine dipstick   (*)    Value: TEST NOT REPORTED DUE TO COLOR INTERFERENCE OF URINE PIGMENT   Bilirubin Urine   (*)    Value: TEST NOT REPORTED DUE TO COLOR INTERFERENCE OF URINE PIGMENT   Ketones, ur   (*)    Value: TEST NOT REPORTED DUE TO COLOR INTERFERENCE OF URINE PIGMENT   Protein, ur   (*)    Value: TEST NOT REPORTED DUE TO COLOR INTERFERENCE OF URINE PIGMENT   Nitrite   (*)    Value: TEST NOT REPORTED DUE TO COLOR INTERFERENCE OF URINE PIGMENT   Leukocytes,Ua   (*)    Value: TEST NOT REPORTED DUE TO COLOR INTERFERENCE OF URINE PIGMENT   All  other components within normal limits  URINALYSIS, MICROSCOPIC (REFLEX) - Abnormal; Notable for the following components:   Bacteria, UA MANY (*)    All other components within normal limits  URINE CULTURE  LACTIC ACID, PLASMA  LACTIC ACID, PLASMA  PROTIME-INR    EKG None  Radiology CT ABDOMEN PELVIS W CONTRAST  Result Date: 03/01/2022 CLINICAL DATA:  Gross macroscopic hematuria EXAM: CT ABDOMEN AND PELVIS WITH CONTRAST TECHNIQUE: Multidetector CT imaging of the abdomen and pelvis was performed using the standard protocol following bolus administration of intravenous contrast. RADIATION DOSE REDUCTION: This exam was performed according to the departmental dose-optimization program which includes automated exposure control, adjustment of the mA and/or kV according to patient size and/or use of iterative reconstruction technique. CONTRAST:  162m OMNIPAQUE IOHEXOL 300 MG/ML  SOLN COMPARISON:  CT 10/23/2013 FINDINGS: Lower chest: Bibasilar atelectasis/scarring.  No acute abnormality. Hepatobiliary: No suspicious liver lesion. Decompressed gallbladder. No biliary dilation. Pancreas: Unremarkable. No pancreatic ductal dilatation or surrounding inflammatory changes. Spleen: Unremarkable. Adrenals/Urinary Tract: Normal adrenal glands. No urinary calculi or hydronephrosis. Heterogenous density fluid within the bladder compatible with blood clots. Foley catheter and gas within the bladder. Mild bladder wall thickening. Stomach/Bowel: Colonic diverticulosis without diverticulitis. There is similar focal concentric thickening of the proximal sigmoid colon wall. This is not substantially changed from 2015. Mildly prominent loops of small bowel in the central abdomen with fecalization of small bowel contents (circa series 4/image 41). No definite discrete transition point though earlier partial small bowel obstruction is difficult to exclude. The small bowel in this area  measures up to 2.6 cm in diameter. Normal  appendix. Unremarkable stomach. Vascular/Lymphatic: There are again a few prominent lymph nodes about the area of sigmoid colon wall thickening. For example 7 mm lymph node posterior to the sigmoid colon (2/57). Unremarkable aorta. Reproductive: Enlarged prostate. Other: Trace free fluid in the pelvis.  No free intraperitoneal air. Musculoskeletal: No acute fracture. IMPRESSION: 1. Heterogenous fluid within the bladder compatible with blood clots. 2. Mild bladder wall thickening may be due to obstructive uropathy given enlargement of the prostate versus cystitis. 3. Mildly prominent loops of small bowel in the central abdomen with fecalization of small bowel contents (circa series 4/image 41). No definite discrete transition point though earlier partial small bowel obstruction is difficult to exclude. 4. Similar focal wall thickening of the proximal sigmoid colon compared to 2015. Given long-term stability this is unlikely neoplastic and may be a chronic normal finding or due to mild diverticulitis. Electronically Signed   By: Placido Sou M.D.   On: 03/01/2022 02:35    Procedures Procedures    Medications Ordered in ED Medications  ondansetron (ZOFRAN-ODT) disintegrating tablet 8 mg (8 mg Oral Given 02/28/22 2029)  HYDROcodone-acetaminophen (NORCO/VICODIN) 5-325 MG per tablet 1 tablet (1 tablet Oral Given 02/28/22 2029)  lactated ringers bolus 1,000 mL (0 mLs Intravenous Stopped 03/01/22 0156)  piperacillin-tazobactam (ZOSYN) IVPB 3.375 g (0 g Intravenous Stopped 03/01/22 0156)  fentaNYL (SUBLIMAZE) injection 50 mcg (50 mcg Intravenous Given 03/01/22 0041)  iohexol (OMNIPAQUE) 300 MG/ML solution 100 mL (100 mLs Intravenous Contrast Given 03/01/22 0137)  sodium chloride (PF) 0.9 % injection (  Given 03/01/22 0156)    ED Course/ Medical Decision Making/ A&P Clinical Course as of 03/01/22 0517  Thu Mar 01, 2022  0408 Consult to Dr. Junious Silk, urologist, who agrees with plan for medical admission  at this time.  As patient has received antibiotics and fluids and catheter is currently draining, no further intervention warranted in the ED this evening.  He will plan to round on the patient in the morning.  I appreciate his collaboration in the care of this patient. [RS]    Clinical Course User Index [RS] Emeline Darling, PA-C                           Medical Decision Making [phone] with history of Foley catheter for prostatomegaly and urinary retention who presents with concern for passage of large blood clots and intermittent clogging of his Foley catheter x 5 days.  Tachycardic on intake, vitals otherwise normal.  Cardiopulmonary and abdominal exams are as above, mild suprapubic tenderness palpation but no CVAT.  GU exam as above as well.  Differential diagnosis for hematuria includes but is limited to malignancy though this felt less likely and report of normal cystoscopy 3 weeks ago, urinary tract infection, nephro/ureterolithiasis, ureteral trauma, prostatitis, glomerulonephritis, coagulopathy.  Amount and/or Complexity of Data Reviewed Labs: ordered.    Details: CBC with leukocytosis of 25,000, hemoglobin of 10.5 near patient's baseline from 1 month ago.  CMP with hyponatremia of 131, creatinine of 1.36 with mild elevation of BUN to 26, increased from patient's baseline creatinine of 1.1.  Urine very red in color, turbid, many bacteria greater than 50 RBCs, rest of UA unable to be tested secondary to frank hematuria.  Lactic acid is normal.  Urine culture pending. Radiology: ordered.    Details: CT with blood in the bladder, some mildly prominent loops of small bowel, visualized by  this provider.   Risk Prescription drug management. Decision regarding hospitalization.    Patient will require admission to the hospital for gross hematuria in context of likely UTI. Consult to urology as above. Patient symptomatically improved after draining bladder. Resting comfortably at this time.    Vishnu and his family voiced understanding of his medical evaluation and treatment plan. Each of their questions answered to their expressed satisfaction. He is amenable to plan for admission at this time.   This chart was dictated using voice recognition software, Dragon. Despite the best efforts of this provider to proofread and correct errors, errors may still occur which can change documentation meaning.   Final Clinical Impression(s) / ED Diagnoses Final diagnoses:  Hematuria, unspecified type    Rx / DC Orders ED Discharge Orders     None         Aura Dials 03/01/22 0518    Molpus, Jenny Reichmann, MD 03/01/22 [phone]

## 2022-02-28 NOTE — ED Triage Notes (Signed)
Pt states that his catheter has been clogged since Friday with blood clots. It has been taking a while to drain. Pt also complains of prostate pain.

## 2022-02-28 NOTE — ED Provider Triage Note (Signed)
Emergency Medicine Provider Triage Evaluation Note  Joseph Roberts , a 61 y.o. male  was evaluated in triage.  Pt complains of pain with urination.  He has had Foley bag in place for urinary retention for about 1 month.  He states since Friday he has had hematuria, difficulty urinating despite having a Foley bag, and pain with urination.   Review of Systems  Positive: As above Negative: As above  Physical Exam  BP 112/77 (BP Location: Left Arm)   Pulse (!) 126   Temp 99.3 F (37.4 C) (Oral)   Resp 18   SpO2 99%  Gen:   Awake, no distress   Resp:  Normal effort  MSK:   Moves extremities without difficulty  Other:  Abdomen soft nontender.  Medical Decision Making  Medically screening exam initiated at 8:01 PM.  Appropriate orders placed.  Zeshan Sena was informed that the remainder of the evaluation will be completed by another provider, this initial triage assessment does not replace that evaluation, and the importance of remaining in the ED until their evaluation is complete.     Marita Kansas, PA-C 02/28/22 2002

## 2022-02-28 NOTE — ED Provider Notes (Incomplete)
[facility] DEPT Provider Note   CSN: [id number] Arrival date & time: 02/28/22  1717     History {Add pertinent medical, surgical, social history, OB history to HPI:1} Chief Complaint  Patient presents with  . catheter clogged    Joseph Roberts is a 61 y.o. male.  HPI     Home Medications Prior to Admission medications   Medication Sig Start Date End Date Taking? Authorizing Provider  Blood Glucose Monitoring Suppl (ONE TOUCH ULTRA MINI) W/DEVICE KIT 1 each by Does not apply route daily. Test fasting daily    [provider]  Blood Glucose Monitoring Suppl (TRUE METRIX METER) w/Device KIT Use as instructed. Check blood glucose level by fingerstick three times per day. 05/22/19   Gildardo Pounds, NP  CETIRIZINE HCL PO Take by mouth daily.    [provider]  Dulaglutide (TRULICITY) 3 SW/5.4OE SOPN Inject 3 mg as directed once a week. 01/04/22   Kerin Perna, NP  gemfibrozil (LOPID) 600 MG tablet Take 1 tablet (600 mg total) by mouth 2 (two) times daily before a meal. 09/29/21   Kerin Perna, NP  glucose blood (TRUE METRIX BLOOD GLUCOSE TEST) test strip Use as instructed. Check blood glucose level by fingerstick three times per day. 08/11/21   Charlott Rakes, MD  Insulin Glargine (BASAGLAR KWIKPEN) 100 UNIT/ML Inject 48 Units into the skin daily. 02/12/22   Charlott Rakes, MD  Insulin Pen Needle 31G X 5 MM MISC use as directed to inject insulin 4 times daily 08/29/20   Kerin Perna, NP  Insulin Syringe-Needle U-100 (TRUEPLUS INSULIN SYRINGE) 31G X 5/16" 0.3 ML MISC Use to inject Humalog TID. 08/29/20   Charlott Rakes, MD  lisinopril (ZESTRIL) 5 MG tablet Take 1 tablet (5 mg total) by mouth daily. 09/12/21   Kerin Perna, NP  meloxicam (MOBIC) 7.5 MG tablet Take 1 tablet (7.5 mg total) by mouth daily. 11/21/20   Kerin Perna, NP  metFORMIN (GLUCOPHAGE-XR) 500 MG 24 hr tablet Take 2 tablets (1,000 mg  total) by mouth 2 (two) times daily. 01/04/22   Kerin Perna, NP  oxybutynin (DITROPAN-XL) 5 MG 24 hr tablet Take 1 tablet (5 mg total) by mouth daily. 01/22/22     tamsulosin (FLOMAX) 0.4 MG CAPS capsule Take 1 capsule by mouth once daily 08/25/21     tamsulosin (FLOMAX) 0.4 MG CAPS capsule Take 1 capsule (0.4 mg total) by mouth daily. 12/21/21   Josph Macho, MD  traMADol (ULTRAM) 50 MG tablet take 1-2 tablets by mouth every 6 hours as needed 02/27/22     TRUEplus Lancets 28G MISC Use as instructed. Check blood glucose level by fingerstick three times per day. 12/01/19   Gildardo Pounds, NP      Allergies    Patient has no known allergies.    Review of Systems   Review of Systems  Physical Exam Updated Vital Signs BP 96/61   Pulse 85   Temp 99.3 F (37.4 C) (Oral)   Resp 16   Ht 5' 4" (1.626 m)   Wt 74.4 kg   SpO2 99%   BMI 28.15 kg/m  Physical Exam  ED Results / Procedures / Treatments   Labs (all labs ordered are listed, but only abnormal results are displayed) Labs Reviewed  CBC WITH DIFFERENTIAL/PLATELET - Abnormal; Notable for the following components:      Result Value   WBC 25.7 (*)    RBC 3.56 (*)  Hemoglobin 10.5 (*)    HCT 31.7 (*)    Platelets 427 (*)    Neutro Abs 22.9 (*)    Monocytes Absolute 1.1 (*)    Abs Immature Granulocytes 0.21 (*)    All other components within normal limits  COMPREHENSIVE METABOLIC PANEL - Abnormal; Notable for the following components:   Sodium 131 (*)    CO2 18 (*)    Glucose, Bld 178 (*)    BUN 26 (*)    Creatinine, Ser 1.36 (*)    Albumin 3.1 (*)    GFR, Estimated 60 (*)    All other components within normal limits  URINALYSIS, ROUTINE W REFLEX MICROSCOPIC - Abnormal; Notable for the following components:   Color, Urine RED (*)    APPearance TURBID (*)    Glucose, UA   (*)    Value: TEST NOT REPORTED DUE TO COLOR INTERFERENCE OF URINE PIGMENT   Hgb urine dipstick   (*)    Value: TEST NOT REPORTED DUE  TO COLOR INTERFERENCE OF URINE PIGMENT   Bilirubin Urine   (*)    Value: TEST NOT REPORTED DUE TO COLOR INTERFERENCE OF URINE PIGMENT   Ketones, ur   (*)    Value: TEST NOT REPORTED DUE TO COLOR INTERFERENCE OF URINE PIGMENT   Protein, ur   (*)    Value: TEST NOT REPORTED DUE TO COLOR INTERFERENCE OF URINE PIGMENT   Nitrite   (*)    Value: TEST NOT REPORTED DUE TO COLOR INTERFERENCE OF URINE PIGMENT   Leukocytes,Ua   (*)    Value: TEST NOT REPORTED DUE TO COLOR INTERFERENCE OF URINE PIGMENT   All other components within normal limits  URINALYSIS, MICROSCOPIC (REFLEX) - Abnormal; Notable for the following components:   Bacteria, UA MANY (*)    All other components within normal limits  URINE CULTURE  LACTIC ACID, PLASMA  LACTIC ACID, PLASMA    EKG None  Radiology No results found.  Procedures Procedures  {Document cardiac monitor, telemetry assessment procedure when appropriate:1}  Medications Ordered in ED Medications  ondansetron (ZOFRAN-ODT) disintegrating tablet 8 mg (8 mg Oral Given 02/28/22 2029)  HYDROcodone-acetaminophen (NORCO/VICODIN) 5-325 MG per tablet 1 tablet (1 tablet Oral Given 02/28/22 2029)    ED Course/ Medical Decision Making/ A&P                           Medical Decision Making  ***  {Document critical care time when appropriate:1} {Document review of labs and clinical decision tools ie heart score, Chads2Vasc2 etc:1}  {Document your independent review of radiology images, and any outside records:1} {Document your discussion with family members, caretakers, and with consultants:1} {Document social determinants of health affecting pt's care:1} {Document your decision making why or why not admission, treatments were needed:1} Final Clinical Impression(s) / ED Diagnoses Final diagnoses:  None    Rx / DC Orders ED Discharge Orders     None

## 2022-03-01 ENCOUNTER — Encounter (HOSPITAL_COMMUNITY): Payer: Self-pay

## 2022-03-01 ENCOUNTER — Emergency Department (HOSPITAL_COMMUNITY): Payer: Self-pay

## 2022-03-01 DIAGNOSIS — N390 Urinary tract infection, site not specified: Secondary | ICD-10-CM | POA: Insufficient documentation

## 2022-03-01 DIAGNOSIS — N400 Enlarged prostate without lower urinary tract symptoms: Secondary | ICD-10-CM | POA: Insufficient documentation

## 2022-03-01 DIAGNOSIS — T83091A Other mechanical complication of indwelling urethral catheter, initial encounter: Secondary | ICD-10-CM | POA: Insufficient documentation

## 2022-03-01 DIAGNOSIS — D649 Anemia, unspecified: Secondary | ICD-10-CM | POA: Insufficient documentation

## 2022-03-01 DIAGNOSIS — R310 Gross hematuria: Secondary | ICD-10-CM

## 2022-03-01 DIAGNOSIS — E871 Hypo-osmolality and hyponatremia: Secondary | ICD-10-CM | POA: Insufficient documentation

## 2022-03-01 LAB — URINE CULTURE: Culture: <10000 — AB

## 2022-03-01 LAB — HIV ANTIBODY (ROUTINE TESTING W REFLEX): HIV Screen 4th Generation wRfx: NONREACTIVE

## 2022-03-01 LAB — GLUCOSE, CAPILLARY
Glucose-Capillary: 174 mg/dL — ABNORMAL HIGH (ref 70–99)
Glucose-Capillary: 125 mg/dL — ABNORMAL HIGH (ref 70–99)

## 2022-03-01 LAB — IRON AND TIBC
Iron: 19 ug/dL — ABNORMAL LOW (ref 45–182)
Saturation Ratios: 5 % — ABNORMAL LOW (ref 17.9–39.5)
TIBC: 378 ug/dL (ref 250–450)
UIBC: 359 ug/dL

## 2022-03-01 LAB — PROTIME-INR
INR: 1.1 (ref 0.8–1.2)
Prothrombin Time: 14 seconds (ref 11.4–15.2)

## 2022-03-01 LAB — HEMOGLOBIN AND HEMATOCRIT, BLOOD
HCT: 28.8 % — ABNORMAL LOW (ref 39.0–52.0)
HCT: 25.1 % — ABNORMAL LOW (ref 39.0–52.0)
Hemoglobin: 9.3 g/dL — ABNORMAL LOW (ref 13.0–17.0)
Hemoglobin: 8.2 g/dL — ABNORMAL LOW (ref 13.0–17.0)

## 2022-03-01 LAB — CBG MONITORING, ED: Glucose-Capillary: 119 mg/dL — ABNORMAL HIGH (ref 70–99)

## 2022-03-01 MED ORDER — LACTATED RINGERS IV BOLUS
1000.0000 mL | Freq: Once | INTRAVENOUS | Status: AC
  Administered 2022-03-01: 1000 mL via INTRAVENOUS

## 2022-03-01 MED ORDER — CHLORHEXIDINE GLUCONATE CLOTH 2 % EX PADS: 6.0000 | MEDICATED_PAD | Freq: Every day | CUTANEOUS | Status: DC

## 2022-03-01 MED ORDER — OXYCODONE HCL 5 MG PO TABS
5.0000 mg | ORAL_TABLET | ORAL | Status: DC | PRN
  Administered 2022-03-01 – 2022-03-07 (×5): 5 mg via ORAL
  Filled 2022-03-01 (×5): qty 1

## 2022-03-01 MED ORDER — FENTANYL CITRATE PF 50 MCG/ML IJ SOSY
50.0000 ug | PREFILLED_SYRINGE | Freq: Once | INTRAMUSCULAR | Status: AC
  Administered 2022-03-01: 50 ug via INTRAVENOUS
  Filled 2022-03-01: qty 1

## 2022-03-01 MED ORDER — FINASTERIDE 5 MG PO TABS
5.0000 mg | ORAL_TABLET | Freq: Every day | ORAL | Status: DC
  Administered 2022-03-01 – 2022-03-07 (×6): 5 mg via ORAL
  Filled 2022-03-01 (×7): qty 1

## 2022-03-01 MED ORDER — ACETAMINOPHEN 325 MG PO TABS
650.0000 mg | ORAL_TABLET | Freq: Four times a day (QID) | ORAL | Status: DC | PRN
  Administered 2022-03-02 – 2022-03-04 (×3): 650 mg via ORAL
  Filled 2022-03-01 (×3): qty 2

## 2022-03-01 MED ORDER — INSULIN ASPART 100 UNIT/ML IJ SOLN
0.0000 [IU] | Freq: Every day | INTRAMUSCULAR | Status: DC
  Administered 2022-03-04: 3 [IU] via SUBCUTANEOUS
  Administered 2022-03-06: 2 [IU] via SUBCUTANEOUS
  Filled 2022-03-01: qty 0.05

## 2022-03-01 MED ORDER — MORPHINE SULFATE (PF) 2 MG/ML IV SOLN
2.0000 mg | INTRAVENOUS | Status: DC | PRN
  Administered 2022-03-04: 2 mg via INTRAVENOUS
  Filled 2022-03-01: qty 1

## 2022-03-01 MED ORDER — IOHEXOL 300 MG/ML  SOLN
100.0000 mL | Freq: Once | INTRAMUSCULAR | Status: AC | PRN
  Administered 2022-03-01: 100 mL via INTRAVENOUS

## 2022-03-01 MED ORDER — PIPERACILLIN-TAZOBACTAM 3.375 G IVPB 30 MIN
3.3750 g | Freq: Once | INTRAVENOUS | Status: AC
  Administered 2022-03-01: 3.375 g via INTRAVENOUS
  Filled 2022-03-01: qty 50

## 2022-03-01 MED ORDER — ACETAMINOPHEN 650 MG RE SUPP: 650.0000 mg | Freq: Four times a day (QID) | RECTAL | Status: DC | PRN

## 2022-03-01 MED ORDER — SODIUM CHLORIDE (PF) 0.9 % IJ SOLN
INTRAMUSCULAR | Status: AC
  Filled 2022-03-01: qty 50

## 2022-03-01 MED ORDER — BICALUTAMIDE 50 MG PO TABS
50.0000 mg | ORAL_TABLET | Freq: Every day | ORAL | Status: DC
  Administered 2022-03-01 – 2022-03-07 (×6): 50 mg via ORAL
  Filled 2022-03-01 (×7): qty 1

## 2022-03-01 MED ORDER — CHLORHEXIDINE GLUCONATE CLOTH 2 % EX PADS
6.0000 | MEDICATED_PAD | Freq: Every day | CUTANEOUS | Status: DC
  Administered 2022-03-02 – 2022-03-06 (×4): 6 via TOPICAL

## 2022-03-01 MED ORDER — INSULIN ASPART 100 UNIT/ML IJ SOLN
0.0000 [IU] | Freq: Three times a day (TID) | INTRAMUSCULAR | Status: DC
  Administered 2022-03-01: 3 [IU] via SUBCUTANEOUS
  Administered 2022-03-02 (×2): 2 [IU] via SUBCUTANEOUS
  Administered 2022-03-02 – 2022-03-03 (×2): 3 [IU] via SUBCUTANEOUS
  Administered 2022-03-03: 5 [IU] via SUBCUTANEOUS
  Administered 2022-03-03: 3 [IU] via SUBCUTANEOUS
  Administered 2022-03-04: 11 [IU] via SUBCUTANEOUS
  Administered 2022-03-04 (×2): 2 [IU] via SUBCUTANEOUS
  Administered 2022-03-05: 5 [IU] via SUBCUTANEOUS
  Administered 2022-03-05: 3 [IU] via SUBCUTANEOUS
  Administered 2022-03-05: 11 [IU] via SUBCUTANEOUS
  Administered 2022-03-06: 5 [IU] via SUBCUTANEOUS
  Administered 2022-03-06 – 2022-03-07 (×3): 3 [IU] via SUBCUTANEOUS
  Filled 2022-03-01: qty 0.15

## 2022-03-01 MED ORDER — SODIUM CHLORIDE 0.9 % IV SOLN
2.0000 g | INTRAVENOUS | Status: DC
  Administered 2022-03-01 – 2022-03-07 (×7): 2 g via INTRAVENOUS
  Filled 2022-03-01 (×7): qty 20

## 2022-03-01 MED ORDER — SODIUM CHLORIDE 0.9 % IV SOLN: INTRAVENOUS | Status: DC

## 2022-03-01 NOTE — Consult Note (Signed)
Urology Consult   Physician requesting consult: Cherylann Ratel, DO  Reason for consult: clogged catheter  History of Present Illness: Joseph Roberts is a 61 y.o. male with a PMH of DM, HLD, HTN, and BPH with chronic indwelling foley catheter who presented to the [facility] ED with clot retention. He was seen in the office on 02/27/22 by Dr. Louis Meckel who noted gross hematuria with occlusion of catheter. His foley was upsized to a 22Fr 3-way and he was manually irrigated before going home. He reports his catheter again stopped draining and he began experiencing lower abdominal pain. He also noted feeling weak.   On arrival in the ED patient's catheter was not draining. Manual irrigation by RN restored flow. Labs are notable for Cr 1.36, Hgb 10.5, WBC 25.7. UA with color interference, but + bacteruria and pyuria. Culture pending. CT A/P with heterogenous debris within bladder compatible with blood products.  On Urologic eval this am, catheter was draining thin wine red urine. It was manually irrigated with restoration of unobstructed flow. Minimal clots were returned and urine irrigated to a light pink. Bladder scan confirmed decompressed bladder. Foley exchange was not performed as it was easily irrigated and draining appropriately.  Past Medical History:  Diagnosis Date   Diabetes mellitus 2011   High triglycerides    Hypertension     Past Surgical History:  Procedure Laterality Date   COLONOSCOPY N/A 10/24/2013   Procedure: COLONOSCOPY;  Surgeon: Ladene Artist, MD;  Location: [facility];  Service: Endoscopy;  Laterality: N/A;. diverticulosis, mild, small internal hemorrhoids    Current Hospital Medications:  Home Meds:  No current facility-administered medications on file prior to encounter.   Current Outpatient Medications on File Prior to Encounter  Medication Sig Dispense Refill   Blood Glucose Monitoring Suppl (ONE TOUCH ULTRA MINI) W/DEVICE KIT 1 each by Does not apply route  daily. Test fasting daily     Blood Glucose Monitoring Suppl (TRUE METRIX METER) w/Device KIT Use as instructed. Check blood glucose level by fingerstick three times per day. 1 kit 0   CETIRIZINE HCL PO Take by mouth daily.     Dulaglutide (TRULICITY) 3 DG/3.8VF SOPN Inject 3 mg as directed once a week. 2 mL 3   gemfibrozil (LOPID) 600 MG tablet Take 1 tablet (600 mg total) by mouth 2 (two) times daily before a meal. 180 tablet 1   glucose blood (TRUE METRIX BLOOD GLUCOSE TEST) test strip Use as instructed. Check blood glucose level by fingerstick three times per day. 100 each 12   Insulin Glargine (BASAGLAR KWIKPEN) 100 UNIT/ML Inject 48 Units into the skin daily. 15 mL 2   Insulin Pen Needle 31G X 5 MM MISC use as directed to inject insulin 4 times daily 200 each 3   Insulin Syringe-Needle U-100 (TRUEPLUS INSULIN SYRINGE) 31G X 5/16" 0.3 ML MISC Use to inject Humalog TID. 100 each 2   lisinopril (ZESTRIL) 5 MG tablet Take 1 tablet (5 mg total) by mouth daily. 90 tablet 1   meloxicam (MOBIC) 7.5 MG tablet Take 1 tablet (7.5 mg total) by mouth daily. 30 tablet 2   metFORMIN (GLUCOPHAGE-XR) 500 MG 24 hr tablet Take 2 tablets (1,000 mg total) by mouth 2 (two) times daily. 120 tablet 2   oxybutynin (DITROPAN-XL) 5 MG 24 hr tablet Take 1 tablet (5 mg total) by mouth daily. 30 tablet 11   tamsulosin (FLOMAX) 0.4 MG CAPS capsule Take 1 capsule by mouth once daily 90 capsule 0  tamsulosin (FLOMAX) 0.4 MG CAPS capsule Take 1 capsule (0.4 mg total) by mouth daily. 90 capsule 3   traMADol (ULTRAM) 50 MG tablet take 1-2 tablets by mouth every 6 hours as needed 10 tablet 0   TRUEplus Lancets 28G MISC Use as instructed. Check blood glucose level by fingerstick three times per day. 100 each 3     Scheduled Meds:  insulin aspart  0-15 Units Subcutaneous TID WC   insulin aspart  0-5 Units Subcutaneous QHS   Continuous Infusions:  sodium chloride 100 mL/hr at 03/01/22 1041   cefTRIAXone (ROCEPHIN)  IV  Stopped (03/01/22 1103)   PRN Meds:.acetaminophen **OR** acetaminophen, morphine injection, oxyCODONE  Allergies: No Known Allergies  Family History  Problem Relation Age of Onset   Diabetes Mother    Diabetes Sister    Diabetes Brother     Social History:  reports that he has been smoking cigarettes. He has a 15.00 pack-year smoking history. He has never used smokeless tobacco. He reports that he does not drink alcohol and does not use drugs.  ROS: A complete review of systems was performed.  All systems are negative except for pertinent findings as noted.  Physical Exam:  Vital signs in last 24 hours: Temp:  [98.2 F (36.8 C)-99.3 F (37.4 C)] 98.2 F (36.8 C) (12/28 1000) Pulse Rate:  [67-126] 69 (12/28 1100) Resp:  [16-18] 16 (12/28 1100) BP: (92-124)/(61-85) 100/69 (12/28 1100) SpO2:  [96 %-99 %] 98 % (12/28 1100) Weight:  [74.4 kg] 74.4 kg (12/27 2021) Constitutional:  Alert and oriented, No acute distress Cardiovascular: Regular rate and rhythm, No JVD Respiratory: Normal respiratory effort, Lungs clear bilaterally GI: Abdomen is soft, nontender, nondistended, no abdominal masses GU: 22Fr hematuria catheter in place draining light pink urine without clot. Initially wine red prior to irrigation Lymphatic: No lymphadenopathy Neurologic: Grossly intact, no focal deficits Psychiatric: Normal mood and affect  Laboratory Data:  Recent Labs    02/28/22 2001  WBC 25.7*  HGB 10.5*  HCT 31.7*  PLT 427*    Recent Labs    02/28/22 2001  NA 131*  K 4.3  CL 105  GLUCOSE 178*  BUN 26*  CALCIUM 9.0  CREATININE 1.36*     Results for orders placed or performed during the hospital encounter of 02/28/22 (from the past 24 hour(s))  CBC with Differential     Status: Abnormal   Collection Time: 02/28/22  8:01 PM  Result Value Ref Range   WBC 25.7 (H) 4.0 - 10.5 K/uL   RBC 3.56 (L) 4.22 - 5.81 MIL/uL   Hemoglobin 10.5 (L) 13.0 - 17.0 g/dL   HCT 31.7 (L) 39.0 - 52.0  %   MCV 89.0 80.0 - 100.0 fL   MCH 29.5 26.0 - 34.0 pg   MCHC 33.1 30.0 - 36.0 g/dL   RDW 14.6 11.5 - 15.5 %   Platelets 427 (H) 150 - 400 K/uL   nRBC 0.0 0.0 - 0.2 %   Neutrophils Relative % 90 %   Neutro Abs 22.9 (H) 1.7 - 7.7 K/uL   Lymphocytes Relative 5 %   Lymphs Abs 1.3 0.7 - 4.0 K/uL   Monocytes Relative 4 %   Monocytes Absolute 1.1 (H) 0.1 - 1.0 K/uL   Eosinophils Relative 0 %   Eosinophils Absolute 0.0 0.0 - 0.5 K/uL   Basophils Relative 0 %   Basophils Absolute 0.1 0.0 - 0.1 K/uL   Immature Granulocytes 1 %   Abs Immature Granulocytes 0.21 (  H) 0.00 - 0.07 K/uL  Comprehensive metabolic panel     Status: Abnormal   Collection Time: 02/28/22  8:01 PM  Result Value Ref Range   Sodium 131 (L) 135 - 145 mmol/L   Potassium 4.3 3.5 - 5.1 mmol/L   Chloride 105 98 - 111 mmol/L   CO2 18 (L) 22 - 32 mmol/L   Glucose, Bld 178 (H) 70 - 99 mg/dL   BUN 26 (H) 6 - 20 mg/dL   Creatinine, Ser 1.36 (H) 0.61 - 1.24 mg/dL   Calcium 9.0 8.9 - 10.3 mg/dL   Total Protein 7.0 6.5 - 8.1 g/dL   Albumin 3.1 (L) 3.5 - 5.0 g/dL   AST 17 15 - 41 U/L   ALT 19 0 - 44 U/L   Alkaline Phosphatase 120 38 - 126 U/L   Total Bilirubin 0.6 0.3 - 1.2 mg/dL   GFR, Estimated 60 (L) >60 mL/min   Anion gap 8 5 - 15  Lactic acid, plasma     Status: None   Collection Time: 02/28/22  8:38 PM  Result Value Ref Range   Lactic Acid, Venous 1.7 0.5 - 1.9 mmol/L  Urinalysis, Routine w reflex microscopic Urine, Catheterized     Status: Abnormal   Collection Time: 02/28/22  8:51 PM  Result Value Ref Range   Color, Urine RED (A) YELLOW   APPearance TURBID (A) CLEAR   Specific Gravity, Urine  1.005 - 1.030    TEST NOT REPORTED DUE TO COLOR INTERFERENCE OF URINE PIGMENT   pH  5.0 - 8.0    TEST NOT REPORTED DUE TO COLOR INTERFERENCE OF URINE PIGMENT   Glucose, UA (A) NEGATIVE mg/dL    TEST NOT REPORTED DUE TO COLOR INTERFERENCE OF URINE PIGMENT   Hgb urine dipstick (A) NEGATIVE    TEST NOT REPORTED DUE TO COLOR  INTERFERENCE OF URINE PIGMENT   Bilirubin Urine (A) NEGATIVE    TEST NOT REPORTED DUE TO COLOR INTERFERENCE OF URINE PIGMENT   Ketones, ur (A) NEGATIVE mg/dL    TEST NOT REPORTED DUE TO COLOR INTERFERENCE OF URINE PIGMENT   Protein, ur (A) NEGATIVE mg/dL    TEST NOT REPORTED DUE TO COLOR INTERFERENCE OF URINE PIGMENT   Nitrite (A) NEGATIVE    TEST NOT REPORTED DUE TO COLOR INTERFERENCE OF URINE PIGMENT   Leukocytes,Ua (A) NEGATIVE    TEST NOT REPORTED DUE TO COLOR INTERFERENCE OF URINE PIGMENT  Urinalysis, Microscopic (reflex)     Status: Abnormal   Collection Time: 02/28/22  8:51 PM  Result Value Ref Range   RBC / HPF >50 0 - 5 RBC/hpf   WBC, UA >50 0 - 5 WBC/hpf   Bacteria, UA MANY (A) NONE SEEN   Squamous Epithelial / LPF NONE SEEN 0 - 5 /HPF   Budding Yeast PRESENT   Lactic acid, plasma     Status: None   Collection Time: 02/28/22 11:16 PM  Result Value Ref Range   Lactic Acid, Venous 1.0 0.5 - 1.9 mmol/L  Protime-INR     Status: None   Collection Time: 03/01/22 12:34 AM  Result Value Ref Range   Prothrombin Time 14.0 11.4 - 15.2 seconds   INR 1.1 0.8 - 1.2  CBG monitoring, ED     Status: Abnormal   Collection Time: 03/01/22 11:59 AM  Result Value Ref Range   Glucose-Capillary 119 (H) 70 - 99 mg/dL   No results found for this or any previous visit (from the past 240  hour(s)).  Renal Function: Recent Labs    02/28/22 2001  CREATININE 1.36*   Estimated Creatinine Clearance: 53.3 mL/min (A) (by C-G formula based on SCr of 1.36 mg/dL (H)).  Radiologic Imaging: CT ABDOMEN PELVIS W CONTRAST  Result Date: 03/01/2022 CLINICAL DATA:  Gross macroscopic hematuria EXAM: CT ABDOMEN AND PELVIS WITH CONTRAST TECHNIQUE: Multidetector CT imaging of the abdomen and pelvis was performed using the standard protocol following bolus administration of intravenous contrast. RADIATION DOSE REDUCTION: This exam was performed according to the departmental dose-optimization program which  includes automated exposure control, adjustment of the mA and/or kV according to patient size and/or use of iterative reconstruction technique. CONTRAST:  166m OMNIPAQUE IOHEXOL 300 MG/ML  SOLN COMPARISON:  CT 10/23/2013 FINDINGS: Lower chest: Bibasilar atelectasis/scarring.  No acute abnormality. Hepatobiliary: No suspicious liver lesion. Decompressed gallbladder. No biliary dilation. Pancreas: Unremarkable. No pancreatic ductal dilatation or surrounding inflammatory changes. Spleen: Unremarkable. Adrenals/Urinary Tract: Normal adrenal glands. No urinary calculi or hydronephrosis. Heterogenous density fluid within the bladder compatible with blood clots. Foley catheter and gas within the bladder. Mild bladder wall thickening. Stomach/Bowel: Colonic diverticulosis without diverticulitis. There is similar focal concentric thickening of the proximal sigmoid colon wall. This is not substantially changed from 2015. Mildly prominent loops of small bowel in the central abdomen with fecalization of small bowel contents (circa series 4/image 41). No definite discrete transition point though earlier partial small bowel obstruction is difficult to exclude. The small bowel in this area measures up to 2.6 cm in diameter. Normal appendix. Unremarkable stomach. Vascular/Lymphatic: There are again a few prominent lymph nodes about the area of sigmoid colon wall thickening. For example 7 mm lymph node posterior to the sigmoid colon (2/57). Unremarkable aorta. Reproductive: Enlarged prostate. Other: Trace free fluid in the pelvis.  No free intraperitoneal air. Musculoskeletal: No acute fracture. IMPRESSION: 1. Heterogenous fluid within the bladder compatible with blood clots. 2. Mild bladder wall thickening may be due to obstructive uropathy given enlargement of the prostate versus cystitis. 3. Mildly prominent loops of small bowel in the central abdomen with fecalization of small bowel contents (circa series 4/image 41). No  definite discrete transition point though earlier partial small bowel obstruction is difficult to exclude. 4. Similar focal wall thickening of the proximal sigmoid colon compared to 2015. Given long-term stability this is unlikely neoplastic and may be a chronic normal finding or due to mild diverticulitis. Electronically Signed   By: TPlacido SouM.D.   On: 03/01/2022 02:35    I independently reviewed the above imaging studies.  Impression/Recommendation Gross hematuria BPH c/b chronic urinary retention requiring indwelling catheter UTI versus colonization  Catheter did not require exchange and flow was restored with manual irrigation at bedside. Urine is thin but bloody. Would recommend continuing foley catheter to drainage and manually irrigating PRN for decreased flow or occlusion. Would also obtain formal bladder [place] assess residual clot burden following manual irrigation.  It is unclear if bacteriuria and pyuria is 2/2 active infection versus colonization given chronic catheter use, however in the setting of systemic symptoms (I.e. weakness) would treat as an infection and cater antimicrobials accordingly. Recommend 14 day course.  Patient will follow-up with Urology outpatient for continued discussions regarding bladder outlet procedure.  MLegrand ComoEmmerling 03/01/2022, 12:33 PM

## 2022-03-01 NOTE — H&P (Signed)
History and Physical    Patient: Joseph Roberts [id number] DOB: Aug 04, 1960 DOA: 02/28/2022 DOS: the patient was seen and examined on 03/01/2022 PCP: Kerin Perna, NP  Patient coming from: Home  Chief Complaint:  Chief Complaint  Patient presents with   catheter clogged   HPI: Joseph Roberts is a 61 y.o. male with medical history significant of DM2, HTN, HLD, GERD, urinary retention w/ chronic foley. Presenting with gross hematuria and suprapubic pain. He reports that he started noticing a decrease in urine output from his chronic foley about a week ago. He was seeing clots and gross blood over the next couple of days. This has worsened as the days have gone on. He has also had suprapubic discomfort during that time. He hasn't had any fevers, N/V/D. He is not on any anticoagulation. He reports that when he began to feel weak yesterday evening, he decided to come to the ED for evaluation. He denies any other aggravating or alleviating factors.   Review of Systems: As mentioned in the history of present illness. All other systems reviewed and are negative. Past Medical History:  Diagnosis Date   Diabetes mellitus 2011   High triglycerides    Hypertension    Past Surgical History:  Procedure Laterality Date   COLONOSCOPY N/A 10/24/2013   Procedure: COLONOSCOPY;  Surgeon: Ladene Artist, MD;  Location: [facility] [facility];  Service: Endoscopy;  Laterality: N/A;. diverticulosis, mild, small internal hemorrhoids   Social History:  reports that he has been smoking cigarettes. He has a 15.00 pack-year smoking history. He has never used smokeless tobacco. He reports that he does not drink alcohol and does not use drugs.  No Known Allergies  Family History  Problem Relation Age of Onset   Diabetes Mother    Diabetes Sister    Diabetes Brother     Prior to Admission medications   Medication Sig Start Date End Date Taking? Authorizing Provider  Blood Glucose Monitoring  Suppl (ONE TOUCH ULTRA MINI) W/DEVICE KIT 1 each by Does not apply route daily. Test fasting daily    [provider]  Blood Glucose Monitoring Suppl (TRUE METRIX METER) w/Device KIT Use as instructed. Check blood glucose level by fingerstick three times per day. 05/22/19   Gildardo Pounds, NP  CETIRIZINE HCL PO Take by mouth daily.    [provider]  Dulaglutide (TRULICITY) 3 KD/9.8PJ SOPN Inject 3 mg as directed once a week. 01/04/22   Kerin Perna, NP  gemfibrozil (LOPID) 600 MG tablet Take 1 tablet (600 mg total) by mouth 2 (two) times daily before a meal. 09/29/21   Kerin Perna, NP  glucose blood (TRUE METRIX BLOOD GLUCOSE TEST) test strip Use as instructed. Check blood glucose level by fingerstick three times per day. 08/11/21   Charlott Rakes, MD  Insulin Glargine (BASAGLAR KWIKPEN) 100 UNIT/ML Inject 48 Units into the skin daily. 02/12/22   Charlott Rakes, MD  Insulin Pen Needle 31G X 5 MM MISC use as directed to inject insulin 4 times daily 08/29/20   Kerin Perna, NP  Insulin Syringe-Needle U-100 (TRUEPLUS INSULIN SYRINGE) 31G X 5/16" 0.3 ML MISC Use to inject Humalog TID. 08/29/20   Charlott Rakes, MD  lisinopril (ZESTRIL) 5 MG tablet Take 1 tablet (5 mg total) by mouth daily. 09/12/21   Kerin Perna, NP  meloxicam (MOBIC) 7.5 MG tablet Take 1 tablet (7.5 mg total) by mouth daily. 11/21/20   Kerin Perna, NP  metFORMIN (GLUCOPHAGE-XR) 500 MG  24 hr tablet Take 2 tablets (1,000 mg total) by mouth 2 (two) times daily. 01/04/22   Kerin Perna, NP  oxybutynin (DITROPAN-XL) 5 MG 24 hr tablet Take 1 tablet (5 mg total) by mouth daily. 01/22/22     tamsulosin (FLOMAX) 0.4 MG CAPS capsule Take 1 capsule by mouth once daily 08/25/21     tamsulosin (FLOMAX) 0.4 MG CAPS capsule Take 1 capsule (0.4 mg total) by mouth daily. 12/21/21   Josph Macho, MD  traMADol (ULTRAM) 50 MG tablet take 1-2 tablets by mouth every 6 hours as needed 02/27/22      TRUEplus Lancets 28G MISC Use as instructed. Check blood glucose level by fingerstick three times per day. 12/01/19   Gildardo Pounds, NP    Physical Exam: Vitals:   03/01/22 0415 03/01/22 0445 03/01/22 0531 03/01/22 0630  BP: 100/66 104/74  97/71  Pulse: 67 72  73  Resp: 16   16  Temp:   98.3 F (36.8 C)   TempSrc:   Oral   SpO2: 98% 98%  96%  Weight:      Height:       General: 61 y.o. male resting in bed in NAD Eyes: PERRL, normal sclera ENMT: Nares patent w/o discharge, orophaynx clear, dentition normal, ears w/o discharge/lesions/ulcers Neck: Supple, trachea midline Cardiovascular: RRR, +S1, S2, no m/g/r, equal pulses throughout Respiratory: CTABL, no w/r/r, normal WOB GI: BS+, ND, LLQ/suprapubic TTP, no masses noted, no organomegaly noted GU: foley w/ gross blood MSK: No e/c/c Neuro: A&O x 3, no focal deficits Psyc: Appropriate interaction and affect, calm/cooperative  Data Reviewed:  Results for orders placed or performed during the hospital encounter of 02/28/22 (from the past 24 hour(s))  CBC with Differential     Status: Abnormal   Collection Time: 02/28/22  8:01 PM  Result Value Ref Range   WBC 25.7 (H) 4.0 - 10.5 K/uL   RBC 3.56 (L) 4.22 - 5.81 MIL/uL   Hemoglobin 10.5 (L) 13.0 - 17.0 g/dL   HCT 31.7 (L) 39.0 - 52.0 %   MCV 89.0 80.0 - 100.0 fL   MCH 29.5 26.0 - 34.0 pg   MCHC 33.1 30.0 - 36.0 g/dL   RDW 14.6 11.5 - 15.5 %   Platelets 427 (H) 150 - 400 K/uL   nRBC 0.0 0.0 - 0.2 %   Neutrophils Relative % 90 %   Neutro Abs 22.9 (H) 1.7 - 7.7 K/uL   Lymphocytes Relative 5 %   Lymphs Abs 1.3 0.7 - 4.0 K/uL   Monocytes Relative 4 %   Monocytes Absolute 1.1 (H) 0.1 - 1.0 K/uL   Eosinophils Relative 0 %   Eosinophils Absolute 0.0 0.0 - 0.5 K/uL   Basophils Relative 0 %   Basophils Absolute 0.1 0.0 - 0.1 K/uL   Immature Granulocytes 1 %   Abs Immature Granulocytes 0.21 (H) 0.00 - 0.07 K/uL  Comprehensive metabolic panel     Status: Abnormal   Collection  Time: 02/28/22  8:01 PM  Result Value Ref Range   Sodium 131 (L) 135 - 145 mmol/L   Potassium 4.3 3.5 - 5.1 mmol/L   Chloride 105 98 - 111 mmol/L   CO2 18 (L) 22 - 32 mmol/L   Glucose, Bld 178 (H) 70 - 99 mg/dL   BUN 26 (H) 6 - 20 mg/dL   Creatinine, Ser 1.36 (H) 0.61 - 1.24 mg/dL   Calcium 9.0 8.9 - 10.3 mg/dL   Total Protein 7.0 6.5 -  8.1 g/dL   Albumin 3.1 (L) 3.5 - 5.0 g/dL   AST 17 15 - 41 U/L   ALT 19 0 - 44 U/L   Alkaline Phosphatase 120 38 - 126 U/L   Total Bilirubin 0.6 0.3 - 1.2 mg/dL   GFR, Estimated 60 (L) >60 mL/min   Anion gap 8 5 - 15  Lactic acid, plasma     Status: None   Collection Time: 02/28/22  8:38 PM  Result Value Ref Range   Lactic Acid, Venous 1.7 0.5 - 1.9 mmol/L  Urinalysis, Routine w reflex microscopic Urine, Catheterized     Status: Abnormal   Collection Time: 02/28/22  8:51 PM  Result Value Ref Range   Color, Urine RED (A) YELLOW   APPearance TURBID (A) CLEAR   Specific Gravity, Urine  1.005 - 1.030    TEST NOT REPORTED DUE TO COLOR INTERFERENCE OF URINE PIGMENT   pH  5.0 - 8.0    TEST NOT REPORTED DUE TO COLOR INTERFERENCE OF URINE PIGMENT   Glucose, UA (A) NEGATIVE mg/dL    TEST NOT REPORTED DUE TO COLOR INTERFERENCE OF URINE PIGMENT   Hgb urine dipstick (A) NEGATIVE    TEST NOT REPORTED DUE TO COLOR INTERFERENCE OF URINE PIGMENT   Bilirubin Urine (A) NEGATIVE    TEST NOT REPORTED DUE TO COLOR INTERFERENCE OF URINE PIGMENT   Ketones, ur (A) NEGATIVE mg/dL    TEST NOT REPORTED DUE TO COLOR INTERFERENCE OF URINE PIGMENT   Protein, ur (A) NEGATIVE mg/dL    TEST NOT REPORTED DUE TO COLOR INTERFERENCE OF URINE PIGMENT   Nitrite (A) NEGATIVE    TEST NOT REPORTED DUE TO COLOR INTERFERENCE OF URINE PIGMENT   Leukocytes,Ua (A) NEGATIVE    TEST NOT REPORTED DUE TO COLOR INTERFERENCE OF URINE PIGMENT  Urinalysis, Microscopic (reflex)     Status: Abnormal   Collection Time: 02/28/22  8:51 PM  Result Value Ref Range   RBC / HPF >50 0 - 5 RBC/hpf    WBC, UA >50 0 - 5 WBC/hpf   Bacteria, UA MANY (A) NONE SEEN   Squamous Epithelial / LPF NONE SEEN 0 - 5 /HPF   Budding Yeast PRESENT   Lactic acid, plasma     Status: None   Collection Time: 02/28/22 11:16 PM  Result Value Ref Range   Lactic Acid, Venous 1.0 0.5 - 1.9 mmol/L  Protime-INR     Status: None   Collection Time: 03/01/22 12:34 AM  Result Value Ref Range   Prothrombin Time 14.0 11.4 - 15.2 seconds   INR 1.1 0.8 - 1.2   CT ab/pelvis w/ contrast 1. Heterogenous fluid within the bladder compatible with blood clots. 2. Mild bladder wall thickening may be due to obstructive uropathy given enlargement of the prostate versus cystitis. 3. Mildly prominent loops of small bowel in the central abdomen with fecalization of small bowel contents (circa series 4/image 41). No definite discrete transition point though earlier partial small bowel obstruction is difficult to exclude. 4. Similar focal wall thickening of the proximal sigmoid colon compared to 2015. Given long-term stability this is unlikely neoplastic and may be a chronic normal finding or due to mild diverticulitis.  Assessment and Plan: Gross hematuria Foley obstruction/Chronic foley UTI     - placed in obs, med-sur     - change abx to rocephin, continue fluids     - urology consulted, appreciate assistance     - follow q6H H&H; transfuse for Hgb <  7     - pain control  DM2     - A1c, SSI, DM diet, glucose checks  BPH     - continue home regimen  Normocytic anemia     - see above     - check iron studies  AKI     - see above     - watch nephrotoxins     - fluids  Hyponatremia    - mild    - fluids  HLD     - continue home regimen when confirmed  GERD     - PPI  Advance Care Planning:   Code Status: FULL  Consults: Urology  Family Communication: None at bedside  Severity of Illness: The appropriate patient status for this patient is OBSERVATION. Observation status is judged to be  reasonable and necessary in order to provide the required intensity of service to ensure the patient's safety. The patient's presenting symptoms, physical exam findings, and initial radiographic and laboratory data in the context of their medical condition is felt to place them at decreased risk for further clinical deterioration. Furthermore, it is anticipated that the patient will be medically stable for discharge from the hospital within 2 midnights of admission.   Author: Jonnie Finner, DO 03/01/2022 8:36 AM  For on call review [email or web address].

## 2022-03-01 NOTE — ED Notes (Signed)
Pt. States that he feels week all over

## 2022-03-02 ENCOUNTER — Inpatient Hospital Stay (HOSPITAL_COMMUNITY): Payer: Self-pay

## 2022-03-02 DIAGNOSIS — R319 Hematuria, unspecified: Secondary | ICD-10-CM

## 2022-03-02 LAB — COMPREHENSIVE METABOLIC PANEL WITH GFR
ALT: 16 U/L (ref 0–44)
AST: 14 U/L — ABNORMAL LOW (ref 15–41)
Albumin: 2.5 g/dL — ABNORMAL LOW (ref 3.5–5.0)
Alkaline Phosphatase: 90 U/L (ref 38–126)
Anion gap: 4 — ABNORMAL LOW (ref 5–15)
BUN: 17 mg/dL (ref 6–20)
CO2: 21 mmol/L — ABNORMAL LOW (ref 22–32)
Calcium: 8.6 mg/dL — ABNORMAL LOW (ref 8.9–10.3)
Chloride: 111 mmol/L (ref 98–111)
Creatinine, Ser: 0.84 mg/dL (ref 0.61–1.24)
GFR, Estimated: >60 mL/min
Glucose, Bld: 145 mg/dL — ABNORMAL HIGH (ref 70–99)
Potassium: 3.8 mmol/L (ref 3.5–5.1)
Sodium: 136 mmol/L (ref 135–145)
Total Bilirubin: 0.3 mg/dL (ref 0.3–1.2)
Total Protein: 6.1 g/dL — ABNORMAL LOW (ref 6.5–8.1)

## 2022-03-02 LAB — CBC
HCT: 25.1 % — ABNORMAL LOW (ref 39.0–52.0)
Hemoglobin: 8 g/dL — ABNORMAL LOW (ref 13.0–17.0)
MCH: 28.7 pg (ref 26.0–34.0)
MCHC: 31.9 g/dL (ref 30.0–36.0)
MCV: 90 fL (ref 80.0–100.0)
Platelets: 318 K/uL (ref 150–400)
RBC: 2.79 MIL/uL — ABNORMAL LOW (ref 4.22–5.81)
RDW: 14.5 % (ref 11.5–15.5)
WBC: 7.1 K/uL (ref 4.0–10.5)
nRBC: 0 % (ref 0.0–0.2)

## 2022-03-02 LAB — GLUCOSE, CAPILLARY
Glucose-Capillary: 149 mg/dL — ABNORMAL HIGH (ref 70–99)
Glucose-Capillary: 162 mg/dL — ABNORMAL HIGH (ref 70–99)
Glucose-Capillary: 126 mg/dL — ABNORMAL HIGH (ref 70–99)
Glucose-Capillary: 154 mg/dL — ABNORMAL HIGH (ref 70–99)

## 2022-03-02 LAB — RESP PANEL BY RT-PCR (RSV, FLU A&B, COVID)  RVPGX2
Influenza A by PCR: NEGATIVE
Influenza B by PCR: NEGATIVE
Resp Syncytial Virus by PCR: NEGATIVE
SARS Coronavirus 2 by RT PCR: NEGATIVE

## 2022-03-02 MED ORDER — INSULIN GLARGINE-YFGN 100 UNIT/ML ~~LOC~~ SOLN
14.0000 [IU] | Freq: Every day | SUBCUTANEOUS | Status: DC
  Administered 2022-03-02 – 2022-03-04 (×3): 14 [IU] via SUBCUTANEOUS
  Filled 2022-03-02 (×3): qty 0.14

## 2022-03-02 MED ORDER — SODIUM CHLORIDE 0.9 % IV SOLN
250.0000 mg | Freq: Every day | INTRAVENOUS | Status: AC
  Administered 2022-03-02 – 2022-03-03 (×2): 250 mg via INTRAVENOUS
  Filled 2022-03-02 (×2): qty 20

## 2022-03-02 MED ORDER — LISINOPRIL 10 MG PO TABS
5.0000 mg | ORAL_TABLET | Freq: Every day | ORAL | Status: DC
  Administered 2022-03-02 – 2022-03-07 (×5): 5 mg via ORAL
  Filled 2022-03-02 (×5): qty 1

## 2022-03-02 MED ORDER — GEMFIBROZIL 600 MG PO TABS
600.0000 mg | ORAL_TABLET | Freq: Two times a day (BID) | ORAL | Status: DC
  Administered 2022-03-03 – 2022-03-07 (×8): 600 mg via ORAL
  Filled 2022-03-02 (×11): qty 1

## 2022-03-02 MED ORDER — PANTOPRAZOLE SODIUM 40 MG PO TBEC
40.0000 mg | DELAYED_RELEASE_TABLET | Freq: Every day | ORAL | Status: DC
  Administered 2022-03-02 – 2022-03-07 (×5): 40 mg via ORAL
  Filled 2022-03-02 (×5): qty 1

## 2022-03-02 NOTE — Hospital Course (Signed)
61 y.o. male with medical history significant of DM2, HTN, HLD, GERD, urinary retention w/ chronic foley. Presenting with gross hematuria and suprapubic pain. He reports that he started noticing a decrease in urine output from his chronic foley about a week ago. He was seeing clots and gross blood over the next couple of days. This has worsened as the days have gone on. He has also had suprapubic discomfort during that time. He hasn't had any fevers, N/V/D. He is not on any anticoagulation. He reports that when he began to feel weak yesterday evening, he decided to come to the ED for evaluation. He denies any other aggravating or alleviating factors.

## 2022-03-02 NOTE — Progress Notes (Signed)
Subjective: No acute events overnight. Daughter-in-law interpreted via FaceTime with patient and wife. Catheter initially draining strawberry red. Manually irrigated with ~2.5L NS to a light pink color, yielded ~500cc old clot.  Objective: Vital signs in last 24 hours: Temp:  [97.9 F (36.6 C)-99.3 F (37.4 C)] 97.9 F (36.6 C) (12/29 0433) Pulse Rate:  [69-102] 76 (12/29 0433) Resp:  [16-19] 18 (12/29 0433) BP: (100-129)/(62-85) 109/70 (12/29 0433) SpO2:  [97 %-100 %] 98 % (12/29 0433)  Intake/Output from previous day: 12/28 0701 - 12/29 0700 In: 2754.4 [I.V.:1654.4; IV Piggyback:1100] Out: 1150 [Urine:1150] Intake/Output this shift: Total I/O In: -  Out: 1600 [Urine:1600]  Physical Exam:  General: Alert and oriented CV: RRR Lungs: Clear Abdomen: Soft, ND GU: foley catheter in place draining light pink Ext: NT, No erythema  Lab Results: Recent Labs    03/01/22 1602 03/01/22 2114 03/02/22 0457  HGB 9.3* 8.2* 8.0*  HCT 28.8* 25.1* 25.1*   BMET Recent Labs    02/28/22 2001 03/02/22 0457  NA 131* 136  K 4.3 3.8  CL 105 111  CO2 18* 21*  GLUCOSE 178* 145*  BUN 26* 17  CREATININE 1.36* 0.84  CALCIUM 9.0 8.6*     Studies/Results: CT ABDOMEN PELVIS W CONTRAST  Result Date: 03/01/2022 CLINICAL DATA:  Gross macroscopic hematuria EXAM: CT ABDOMEN AND PELVIS WITH CONTRAST TECHNIQUE: Multidetector CT imaging of the abdomen and pelvis was performed using the standard protocol following bolus administration of intravenous contrast. RADIATION DOSE REDUCTION: This exam was performed according to the departmental dose-optimization program which includes automated exposure control, adjustment of the mA and/or kV according to patient size and/or use of iterative reconstruction technique. CONTRAST:  OMNIPAQUE IOHEXOL 300 MG/ML  SOLN COMPARISON:  CT 10/23/2013 FINDINGS: Lower chest: Bibasilar atelectasis/scarring.  No acute abnormality. Hepatobiliary: No suspicious  liver lesion. Decompressed gallbladder. No biliary dilation. Pancreas: Unremarkable. No pancreatic ductal dilatation or surrounding inflammatory changes. Spleen: Unremarkable. Adrenals/Urinary Tract: Normal adrenal glands. No urinary calculi or hydronephrosis. Heterogenous density fluid within the bladder compatible with blood clots. Foley catheter and gas within the bladder. Mild bladder wall thickening. Stomach/Bowel: Colonic diverticulosis without diverticulitis. There is similar focal concentric thickening of the proximal sigmoid colon wall. This is not substantially changed from 2015. Mildly prominent loops of small bowel in the central abdomen with fecalization of small bowel contents (circa series 4/image 41). No definite discrete transition point though earlier partial small bowel obstruction is difficult to exclude. The small bowel in this area measures up to 2.6 cm in diameter. Normal appendix. Unremarkable stomach. Vascular/Lymphatic: There are again a few prominent lymph nodes about the area of sigmoid colon wall thickening. For example 7 mm lymph node posterior to the sigmoid colon (2/57). Unremarkable aorta. Reproductive: Enlarged prostate. Other: Trace free fluid in the pelvis.  No free intraperitoneal air. Musculoskeletal: No acute fracture. IMPRESSION: 1. Heterogenous fluid within the bladder compatible with blood clots. 2. Mild bladder wall thickening may be due to obstructive uropathy given enlargement of the prostate versus cystitis. 3. Mildly prominent loops of small bowel in the central abdomen with fecalization of small bowel contents (circa series 4/image 41). No definite discrete transition point though earlier partial small bowel obstruction is difficult to exclude. 4. Similar focal wall thickening of the proximal sigmoid colon compared to 2015. Given long-term stability this is unlikely neoplastic and may be a chronic normal finding or due to mild diverticulitis. Electronically Signed    By: Minerva Fester M.D.   On:  03/01/2022 02:35    Assessment/Plan: Gross hematuria BPH c/b chronic urinary retention requiring indwelling catheter UTI versus colonization  Foley manually irrigated with ~500cc clot return, urine ultimately a light pink color. Recommend obtaining a bladder ultrasound today to assess for any residual clot burden. In the interim, continue to trend Hgb, continue finasteride and bicalutamide. Continue broad spectrum antibiotics for 4 weeks.  Foley can be manually irrigated PRN for occlusion or sluggish flow. If unable to restore flow, page Urology.   LOS: 1 day   Carlus Pavlov 03/02/2022, 8:54 AM

## 2022-03-02 NOTE — Progress Notes (Signed)
  Progress Note   Patient: Joseph Roberts [id number]:[id number] DOB: [birth year] DOA: 02/28/2022     1 DOS: the patient was seen and examined on 03/02/2022   Brief hospital course: 61 y.o. male with medical history significant of DM2, HTN, HLD, GERD, urinary retention w/ chronic foley. Presenting with gross hematuria and suprapubic pain. He reports that he started noticing a decrease in urine output from his chronic foley about a week ago. He was seeing clots and gross blood over the next couple of days. This has worsened as the days have gone on. He has also had suprapubic discomfort during that time. He hasn't had any fevers, N/V/D. He is not on any anticoagulation. He reports that when he began to feel weak yesterday evening, he decided to come to the ED for evaluation. He denies any other aggravating or alleviating factors.    Assessment and Plan: Gross hematuria Foley obstruction/Chronic foley UTI - continued on rocephin -UA suggestive of UTI - urology consulted, appreciate assistance. Now s/p bladder irrigation - follow up on urine culture results and narrow abx accordingly. Urology recs for broad spec abx x 4 weeks   DM2 - A1c of 13.3 on most recent check 7/23 -continue  SSI, DM diet, glucose checks -reportedly on 48 units long acting in AM and 44 untis in PM -Thus far glycemic trends are very well controlled -Will cont on 14 units semglee QHS for now, titrate as needed   BPH - continue home regimen   Normocytic anemia - iron low at 19 with iron sat of 5 -Will order IV iron -follow CBC trends   AKI - Renal function normalized -Recheck bmet in AM   Hyponatremia -Normalized with IVF -Recheck bmet in AM   HLD     - continue home regimen    GERD     - PPI         Subjective: Feeling warm, asking for thermostat to be turned down  Physical Exam: Vitals:   03/01/22 2140 03/02/22 0433 03/02/22 1126 03/02/22 1650  BP: 126/78 109/70 (!) 140/87 113/66  Pulse: (!)  102 76 (!) 106 91  Resp: 17 18 20 18   Temp: 99.3 F (37.4 C) 97.9 F (36.6 C) 98.3 F (36.8 C) 100.3 F (37.9 C)  TempSrc: Oral Oral Oral Oral  SpO2: 99% 98% 100% 100%  Weight:      Height:       General exam: Awake, laying in bed, in nad Respiratory system: Normal respiratory effort, no wheezing Cardiovascular system: regular rate, s1, s2 Gastrointestinal system: Soft, nondistended, positive BS Central nervous system: CN2-12 grossly intact, strength intact Extremities: Perfused, no clubbing Skin: Normal skin turgor, no notable skin lesions seen Psychiatry: Mood normal // no visual hallucinations   Data Reviewed:  Na 136, K 3.8, Cr 0.84  Family Communication: Pt in room, family at bedside  Disposition: Status is: Inpatient Remains inpatient appropriate because: Severity of illness  Planned Discharge Destination: Home    Author: , MD 03/02/2022 5:03 PM  For on call review [email or web address].

## 2022-03-03 LAB — COMPREHENSIVE METABOLIC PANEL
ALT: 14 U/L (ref 0–44)
AST: 13 U/L — ABNORMAL LOW (ref 15–41)
Albumin: 2.4 g/dL — ABNORMAL LOW (ref 3.5–5.0)
Alkaline Phosphatase: 71 U/L (ref 38–126)
Anion gap: 4 — ABNORMAL LOW (ref 5–15)
BUN: 13 mg/dL (ref 6–20)
CO2: 21 mmol/L — ABNORMAL LOW (ref 22–32)
Calcium: 8.3 mg/dL — ABNORMAL LOW (ref 8.9–10.3)
Chloride: 110 mmol/L (ref 98–111)
Creatinine, Ser: 0.87 mg/dL (ref 0.61–1.24)
GFR, Estimated: >60 mL/min (ref >60)
Glucose, Bld: 184 mg/dL — ABNORMAL HIGH (ref 70–99)
Potassium: 3.3 mmol/L — ABNORMAL LOW (ref 3.5–5.1)
Sodium: 135 mmol/L (ref 135–145)
Total Bilirubin: 0.5 mg/dL (ref 0.3–1.2)
Total Protein: 5.7 g/dL — ABNORMAL LOW (ref 6.5–8.1)

## 2022-03-03 LAB — GLUCOSE, CAPILLARY
Glucose-Capillary: 160 mg/dL — ABNORMAL HIGH (ref 70–99)
Glucose-Capillary: 177 mg/dL — ABNORMAL HIGH (ref 70–99)
Glucose-Capillary: 219 mg/dL — ABNORMAL HIGH (ref 70–99)
Glucose-Capillary: 186 mg/dL — ABNORMAL HIGH (ref 70–99)

## 2022-03-03 LAB — CBC
HCT: 22.6 % — ABNORMAL LOW (ref 39.0–52.0)
Hemoglobin: 7.2 g/dL — ABNORMAL LOW (ref 13.0–17.0)
MCH: 28.3 pg (ref 26.0–34.0)
MCHC: 31.9 g/dL (ref 30.0–36.0)
MCV: 89 fL (ref 80.0–100.0)
Platelets: 295 10*3/uL (ref 150–400)
RBC: 2.54 MIL/uL — ABNORMAL LOW (ref 4.22–5.81)
RDW: 14.2 % (ref 11.5–15.5)
WBC: 10.1 10*3/uL (ref 4.0–10.5)
nRBC: 0 % (ref 0.0–0.2)

## 2022-03-03 LAB — MAGNESIUM: Magnesium: 1.8 mg/dL (ref 1.7–2.4)

## 2022-03-03 NOTE — Progress Notes (Signed)
The patient foley has been flushed quite a bit overnight to keep it from clogging with clots and urine is very dark red ( per night shift).  Per the patient, he  a headache and a pulsing sound in his right ear that has been there for 3 days. His vitals are stable with his last BP 103/64. Notified MD.

## 2022-03-03 NOTE — Progress Notes (Signed)
  Progress Note   Patient: Joseph Roberts [id number] DOB: 01/18/1961 DOA: 02/28/2022     2 DOS: the patient was seen and examined on 03/03/2022   Brief hospital course: 61 y.o. male with medical history significant of DM2, HTN, HLD, GERD, urinary retention w/ chronic foley. Presenting with gross hematuria and suprapubic pain. He reports that he started noticing a decrease in urine output from his chronic foley about a week ago. He was seeing clots and gross blood over the next couple of days. This has worsened as the days have gone on. He has also had suprapubic discomfort during that time. He hasn't had any fevers, N/V/D. He is not on any anticoagulation. He reports that when he began to feel weak yesterday evening, he decided to come to the ED for evaluation. He denies any other aggravating or alleviating factors.    Assessment and Plan: Gross hematuria Foley obstruction/Chronic foley UTI - continued on rocephin -UA suggestive of UTI - urology consulted, appreciate assistance. Now s/p bladder irrigation - follow up on urine culture results and narrow abx accordingly. Urology recs for broad spec abx x 4 weeks -This AM, pt with grossly bloody urine output. Discussed with Urology, plan for OR for clot evacuation and fulguration tomorrow   DM2 - A1c of 13.3 on most recent check 7/23 -continue  SSI, DM diet, glucose checks -reportedly on 48 units long acting in AM and 44 untis in PM -Thus far glycemic trends are very well controlled -Will cont on 14 units semglee QHS for now. Glycemic trends stable   BPH - continue home regimen   Normocytic anemia - iron low at 19 with iron sat of 5 -Given IV iron -follow CBC trends   AKI - Renal function normalized -Recheck bmet in AM   Hyponatremia -Normalized with IVF -Recheck bmet in AM   HLD     - continue home regimen    GERD     - PPI         Subjective: Reports BM this AM. Despite bloody urine, without other  complaints  Physical Exam: Vitals:   03/03/22 0534 03/03/22 0926 03/03/22 1256 03/03/22 1300  BP: 98/61 103/64 93/63   Pulse: 91 79 70   Resp: 14     Temp: 98.3 F (36.8 C) 98 F (36.7 C)  98.2 F (36.8 C)  TempSrc: Oral Oral  Oral  SpO2: 97% 97% 99%   Weight:      Height:       General exam: Conversant, in no acute distress Respiratory system: normal chest rise, clear, no audible wheezing Cardiovascular system: regular rhythm, s1-s2 Gastrointestinal system: Nondistended, nontender, pos BS Central nervous system: No seizures, no tremors Extremities: No cyanosis, no joint deformities Skin: No rashes, no pallor Psychiatry: Affect normal // no auditory hallucinations   Data Reviewed:  Na 135, K 3.3, Cr 0.87  Family Communication: Pt in room, family at bedside  Disposition: Status is: Inpatient Remains inpatient appropriate because: Severity of illness  Planned Discharge Destination: Home    Author: Rickey Barbara, MD 03/03/2022 6:48 PM  For on call review [email or web address].

## 2022-03-03 NOTE — Consult Note (Signed)
Urology Inpatient Progress Report  Gross hematuria [R31.0] Hematuria, unspecified type [R31.9]    Intv/Subj: Bladder ultrasound obtained yesterday demonstrates organized residual clot in the patient's bladder. Given an iron infusion this morning Overnight and this morning he has had several episodes of clotting his catheter off and hand irrigation. Yesterday when the patient was seen, they were able to irrigate him fairly clear, and he as a result was not started on CBI. Currently the patient is not complaining of any significant bladder pain or tenderness.  Principal Problem:   Gross hematuria Active Problems:   GERD (gastroesophageal reflux disease)   Type 2 diabetes mellitus with hyperglycemia (HCC)   AKI (acute kidney injury) ([place])   Dyslipidemia   BPH (benign prostatic hyperplasia)   Obstruction of Foley catheter (HCC)   UTI (urinary tract infection)   Hyponatremia   Normocytic anemia  Current Facility-Administered Medications  Medication Dose Route Frequency Provider Last Rate Last Admin   0.9 %  sodium chloride infusion   Intravenous Continuous Kyle, Tyrone A, DO 100 mL/hr at 03/02/22 1353 New Bag at 03/02/22 1353   acetaminophen (TYLENOL) tablet 650 mg  650 mg Oral Q6H PRN Marylyn Ishihara, Tyrone A, DO   650 mg at 03/03/22 [id number]   Or   acetaminophen (TYLENOL) suppository 650 mg  650 mg Rectal Q6H PRN Marylyn Ishihara, Tyrone A, DO       bicalutamide (CASODEX) tablet 50 mg  50 mg Oral Daily Festus Aloe, MD   50 mg at 03/03/22 [id number]   cefTRIAXone (ROCEPHIN) 2 g in sodium chloride 0.9 % 100 mL IVPB  2 g Intravenous Q24H Kyle, Tyrone A, DO 200 mL/hr at 03/03/22 0930 2 g at 03/03/22 0930   Chlorhexidine Gluconate Cloth 2 % PADS 6 each  6 each Topical Q0600 Cherylann Ratel A, DO   6 each at 03/03/22 [id number]   finasteride (PROSCAR) tablet 5 mg  5 mg Oral Daily Festus Aloe, MD   5 mg at 03/03/22 [id number]   gemfibrozil (LOPID) tablet 600 mg  600 mg Oral BID AC Donne Hazel, MD   600 mg at 03/03/22  [id number]   insulin aspart (novoLOG) injection 0-15 Units  0-15 Units Subcutaneous TID WC Kyle, Tyrone A, DO   3 Units at 03/03/22 1244   insulin aspart (novoLOG) injection 0-5 Units  0-5 Units Subcutaneous QHS Marylyn Ishihara, Tyrone A, DO       insulin glargine-yfgn (SEMGLEE) injection 14 Units  14 Units Subcutaneous QHS Donne Hazel, MD   14 Units at 03/02/22 2111   lisinopril (ZESTRIL) tablet 5 mg  5 mg Oral Daily Donne Hazel, MD   5 mg at 03/03/22 0920   morphine (PF) 2 MG/ML injection 2 mg  2 mg Intravenous Q4H PRN Marylyn Ishihara, Tyrone A, DO       oxyCODONE (Oxy IR/ROXICODONE) immediate release tablet 5 mg  5 mg Oral Q4H PRN Marylyn Ishihara, Tyrone A, DO   5 mg at 03/01/22 1204   pantoprazole (PROTONIX) EC tablet 40 mg  40 mg Oral Daily Kyle, Tyrone A, DO   40 mg at 03/03/22 0927     Objective: Vital: Vitals:   03/02/22 2024 03/03/22 0534 03/03/22 0926 03/03/22 1256  BP: 102/65 98/61 103/64 93/63  Pulse: 84 91 79 70  Resp: 16 14    Temp: 99.5 F (37.5 C) 98.3 F (36.8 C) 98 F (36.7 C)   TempSrc: Oral Oral Oral   SpO2: 98% 97% 97% 99%  Weight:  Height:       I/Os: I/O last 3 completed shifts: In: 2689 [P.O.:720; I.V.:1869; IV Piggyback:100] Out: [id number] [Urine:7925]  Physical Exam:  General: Patient is in no apparent distress Lungs: Normal respiratory effort, chest expands symmetrically. The abdomen is soft and nontender without mass. 22 French three-way Foley catheter draining maroon/brown urine  Lab Results: Recent Labs    02/28/22 2001 03/01/22 1602 03/01/22 2114 03/02/22 0457 03/03/22 0542  WBC 25.7*  --   --  7.1 10.1  HGB 10.5*   < > 8.2* 8.0* 7.2*  HCT 31.7*   < > 25.1* 25.1* 22.6*   < > = values in this interval not displayed.   Recent Labs    02/28/22 2001 03/02/22 0457 03/03/22 0542  NA 131* 136 135  K 4.3 3.8 3.3*  CL 105 111 110  CO2 18* 21* 21*  GLUCOSE 178* 145* 184*  BUN 26* 17 13  CREATININE 1.36* 0.84 0.87  CALCIUM 9.0 8.6* 8.3*   Recent Labs     03/01/22 0034  INR 1.1   No results for input(s): "LABURIN" in the last 72 hours. Results for orders placed or performed during the hospital encounter of 02/28/22  Urine Culture     Status: Abnormal   Collection Time: 02/28/22  8:51 PM   Specimen: Urine, Clean Catch  Result Value Ref Range Status   Specimen Description   Final    URINE, CLEAN CATCH Performed at [facility], [place] [address], [place], [place] [address]    Special Requests   Final    NONE Performed at [facility], [place] [address] [place], [place] [address]    Culture (A)  Final    <10,000 COLONIES/mL INSIGNIFICANT GROWTH Performed at [facility] [address], [place], [place] [address]    Report Status 03/01/2022 FINAL  Final  Resp panel by RT-PCR (RSV, Flu A&B, Covid) Anterior Nasal Swab     Status: None   Collection Time: 03/02/22 11:38 AM   Specimen: Anterior Nasal Swab  Result Value Ref Range Status   SARS Coronavirus 2 by RT PCR NEGATIVE NEGATIVE Final    Comment: (NOTE) SARS-CoV-2 target nucleic acids are NOT DETECTED.  The SARS-CoV-2 RNA is generally detectable in upper respiratory specimens during the acute phase of infection. The lowest concentration of SARS-CoV-2 viral copies this assay can detect is 138 copies/mL. A negative result does not preclude SARS-Cov-2 infection and should not be used as the sole basis for treatment or other patient management decisions. A negative result may occur with  improper specimen collection/handling, submission of specimen other than nasopharyngeal swab, presence of viral mutation(s) within the areas targeted by this assay, and inadequate number of viral copies(<138 copies/mL). A negative result must be combined with clinical observations, patient history, and epidemiological information. The expected result is Negative.  Fact Sheet for Patients:  EntrepreneurPulse.com.au  Fact Sheet for  Healthcare Providers:  IncredibleEmployment.be  This test is no t yet approved or cleared by the [place] FDA and  has been authorized for detection and/or diagnosis of SARS-CoV-2 by FDA under an Emergency Use Authorization (EUA). This EUA will remain  in effect (meaning this test can be used) for the duration of the COVID-19 declaration under Section 564(b)(1) of the Act, 21 U.S.C.section 360bbb-3(b)(1), unless the authorization is terminated  or revoked sooner.       Influenza A by PCR NEGATIVE NEGATIVE Final   Influenza B by PCR NEGATIVE NEGATIVE  Final    Comment: (NOTE) The Xpert Xpress SARS-CoV-2/FLU/RSV plus assay is intended as an aid in the diagnosis of influenza from Nasopharyngeal swab specimens and should not be used as a sole basis for treatment. Nasal washings and aspirates are unacceptable for Xpert Xpress SARS-CoV-2/FLU/RSV testing.  Fact Sheet for Patients: BloggerCourse.com  Fact Sheet for Healthcare Providers: SeriousBroker.it  This test is not yet approved or cleared by the [place] FDA and has been authorized for detection and/or diagnosis of SARS-CoV-2 by FDA under an Emergency Use Authorization (EUA). This EUA will remain in effect (meaning this test can be used) for the duration of the COVID-19 declaration under Section 564(b)(1) of the Act, 21 U.S.C. section 360bbb-3(b)(1), unless the authorization is terminated or revoked.     Resp Syncytial Virus by PCR NEGATIVE NEGATIVE Final    Comment: (NOTE) Fact Sheet for Patients: BloggerCourse.com  Fact Sheet for Healthcare Providers: SeriousBroker.it  This test is not yet approved or cleared by the [place] FDA and has been authorized for detection and/or diagnosis of SARS-CoV-2 by FDA under an Emergency Use Authorization (EUA). This EUA will remain in effect (meaning this  test can be used) for the duration of the COVID-19 declaration under Section 564(b)(1) of the Act, 21 U.S.C. section 360bbb-3(b)(1), unless the authorization is terminated or revoked.  Performed at [facility], [address], [place], [place] [address]     Studies/Results: I independently reviewed the patient's pelvic ultrasound with the findings as below. US PELVIS LIMITED (TRANSABDOMINAL ONLY)  Result Date: 03/02/2022 CLINICAL DATA:  Hematuria. Bladder mass versus is hematoma. EXAM: ULTRASOUND OF THE MALE PELVIS COMPARISON:  CT scan 03/01/2022 FINDINGS: As demonstrated on the CT scan complex hyperechoic material in the bladder lumen. A Foley catheter is also noted. This has more the appearance of intraluminal hematoma but there are some areas of possible vascularity. Recommend correlation with cystoscopy. IMPRESSION: Complex hyperechoic material in the bladder lumen likely hematoma. Recommend correlation with cystoscopy. Electronically Signed   By: Rudie Meyer M.D.   On: 03/02/2022 16:01    Assessment: Gross hematuria likely secondary to infection with BPH.  He has had some catheter clotting events over the past 24 hours and an ultrasound of the pelvis that demonstrated residual clot within the bladder.  I hand irrigated today and was unable to irrigate any of the clot, suspect that this is all too organized to remove at this time through his Foley catheter.  He did tolerate it fairly well.  The patient has developed anemia, likely secondary to acute blood loss.  He has been receiving iron infusions.   Plan: Given the above, I recommended that we proceed to the operating room for clot evacuation and fulguration.  However, he had just eaten lunch at the time of my consultation and that would put [place] late into the evening/night before we could get to the case.  As such, I recommended that we make him n.p.o. past midnight and perform this tomorrow morning.  I have spoken  to the daughter who does speak fluent English and also with the patient who is greeting to proceed to the operating room if necessary tomorrow.  In the meantime, I did not restart him on continuous bladder irrigation given my concern for occlusion of the Foley catheter and just making things worse.  I encouraged the nursing staff to hand irrigate as needed and contact me if they are unable to restore the flow.   Berniece Salines, MD Urology  03/03/2022, 2:00 PM

## 2022-03-04 ENCOUNTER — Encounter (HOSPITAL_COMMUNITY)
Admission: EM | Disposition: A | Discharge status: Home / Self Care | Payer: Self-pay | Source: Home / Self Care | Attending: Internal Medicine

## 2022-03-04 ENCOUNTER — Inpatient Hospital Stay (HOSPITAL_COMMUNITY): Payer: Self-pay | Admitting: Certified Registered Nurse Anesthetist

## 2022-03-04 DIAGNOSIS — E1165 Type 2 diabetes mellitus with hyperglycemia: Secondary | ICD-10-CM

## 2022-03-04 DIAGNOSIS — F17210 Nicotine dependence, cigarettes, uncomplicated: Secondary | ICD-10-CM

## 2022-03-04 DIAGNOSIS — Z794 Long term (current) use of insulin: Secondary | ICD-10-CM

## 2022-03-04 DIAGNOSIS — Z7984 Long term (current) use of oral hypoglycemic drugs: Secondary | ICD-10-CM

## 2022-03-04 DIAGNOSIS — R338 Other retention of urine: Secondary | ICD-10-CM

## 2022-03-04 DIAGNOSIS — I10 Essential (primary) hypertension: Secondary | ICD-10-CM

## 2022-03-04 DIAGNOSIS — R319 Hematuria, unspecified: Secondary | ICD-10-CM

## 2022-03-04 HISTORY — PX: CYSTOSCOPY WITH FULGERATION: SHX6638

## 2022-03-04 LAB — COMPREHENSIVE METABOLIC PANEL
ALT: 12 U/L (ref 0–44)
AST: 12 U/L — ABNORMAL LOW (ref 15–41)
Albumin: 2.1 g/dL — ABNORMAL LOW (ref 3.5–5.0)
Alkaline Phosphatase: 65 U/L (ref 38–126)
Anion gap: 3 — ABNORMAL LOW (ref 5–15)
BUN: 11 mg/dL (ref 6–20)
CO2: 20 mmol/L — ABNORMAL LOW (ref 22–32)
Calcium: 8.5 mg/dL — ABNORMAL LOW (ref 8.9–10.3)
Chloride: 116 mmol/L — ABNORMAL HIGH (ref 98–111)
Creatinine, Ser: 0.75 mg/dL (ref 0.61–1.24)
GFR, Estimated: >60 mL/min (ref >60)
Glucose, Bld: 179 mg/dL — ABNORMAL HIGH (ref 70–99)
Potassium: 3.8 mmol/L (ref 3.5–5.1)
Sodium: 139 mmol/L (ref 135–145)
Total Bilirubin: 0.2 mg/dL — ABNORMAL LOW (ref 0.3–1.2)
Total Protein: 5.6 g/dL — ABNORMAL LOW (ref 6.5–8.1)

## 2022-03-04 LAB — CBC
HCT: 21.1 % — ABNORMAL LOW (ref 39.0–52.0)
Hemoglobin: 6.8 g/dL — CL (ref 13.0–17.0)
MCH: 29.1 pg (ref 26.0–34.0)
MCHC: 32.2 g/dL (ref 30.0–36.0)
MCV: 90.2 fL (ref 80.0–100.0)
Platelets: 280 10*3/uL (ref 150–400)
RBC: 2.34 MIL/uL — ABNORMAL LOW (ref 4.22–5.81)
RDW: 14.1 % (ref 11.5–15.5)
WBC: 6 10*3/uL (ref 4.0–10.5)
nRBC: 0 % (ref 0.0–0.2)

## 2022-03-04 LAB — HEMOGLOBIN AND HEMATOCRIT, BLOOD
HCT: 25.7 % — ABNORMAL LOW (ref 39.0–52.0)
Hemoglobin: 8.3 g/dL — ABNORMAL LOW (ref 13.0–17.0)

## 2022-03-04 LAB — PREPARE RBC (CROSSMATCH)

## 2022-03-04 LAB — ABO/RH: ABO/RH(D): A POS

## 2022-03-04 LAB — GLUCOSE, CAPILLARY
Glucose-Capillary: 133 mg/dL — ABNORMAL HIGH (ref 70–99)
Glucose-Capillary: 102 mg/dL — ABNORMAL HIGH (ref 70–99)
Glucose-Capillary: 150 mg/dL — ABNORMAL HIGH (ref 70–99)
Glucose-Capillary: 333 mg/dL — ABNORMAL HIGH (ref 70–99)
Glucose-Capillary: 295 mg/dL — ABNORMAL HIGH (ref 70–99)

## 2022-03-04 SURGERY — CYSTOSCOPY, WITH BLADDER FULGURATION
Anesthesia: General | Site: Urethra

## 2022-03-04 MED ORDER — PROPOFOL 10 MG/ML IV BOLUS
INTRAVENOUS | Status: DC | PRN
  Administered 2022-03-04: 200 mg via INTRAVENOUS

## 2022-03-04 MED ORDER — FENTANYL CITRATE PF 50 MCG/ML IJ SOSY
PREFILLED_SYRINGE | INTRAMUSCULAR | Status: AC
  Filled 2022-03-04: qty 3

## 2022-03-04 MED ORDER — ONDANSETRON HCL 4 MG/2ML IJ SOLN: 4.0000 mg | Freq: Once | INTRAMUSCULAR | Status: DC | PRN

## 2022-03-04 MED ORDER — SODIUM CHLORIDE 0.9 % IR SOLN
Status: DC | PRN
  Administered 2022-03-04: 3000 mL

## 2022-03-04 MED ORDER — FENTANYL CITRATE (PF) 100 MCG/2ML IJ SOLN
INTRAMUSCULAR | Status: AC
  Filled 2022-03-04: qty 2

## 2022-03-04 MED ORDER — SODIUM CHLORIDE 0.9% IV SOLUTION: Freq: Once | INTRAVENOUS | Status: DC

## 2022-03-04 MED ORDER — FENTANYL CITRATE PF 50 MCG/ML IJ SOSY
25.0000 ug | PREFILLED_SYRINGE | INTRAMUSCULAR | Status: DC | PRN
  Administered 2022-03-04 (×3): 50 ug via INTRAVENOUS

## 2022-03-04 MED ORDER — DEXAMETHASONE SODIUM PHOSPHATE 10 MG/ML IJ SOLN
INTRAMUSCULAR | Status: AC
  Filled 2022-03-04: qty 1

## 2022-03-04 MED ORDER — LIDOCAINE HCL (PF) 2 % IJ SOLN
INTRAMUSCULAR | Status: AC
  Filled 2022-03-04: qty 5

## 2022-03-04 MED ORDER — AMISULPRIDE (ANTIEMETIC) 5 MG/2ML IV SOLN: 10.0000 mg | Freq: Once | INTRAVENOUS | Status: DC | PRN

## 2022-03-04 MED ORDER — ONDANSETRON HCL 4 MG/2ML IJ SOLN
INTRAMUSCULAR | Status: DC | PRN
  Administered 2022-03-04: 4 mg via INTRAVENOUS

## 2022-03-04 MED ORDER — DEXAMETHASONE SODIUM PHOSPHATE 10 MG/ML IJ SOLN
INTRAMUSCULAR | Status: DC | PRN
  Administered 2022-03-04: 5 mg via INTRAVENOUS

## 2022-03-04 MED ORDER — SODIUM CHLORIDE 0.9 % IR SOLN
3000.0000 mL | Status: DC
  Administered 2022-03-04: 3000 mL

## 2022-03-04 MED ORDER — PHENYLEPHRINE 80 MCG/ML (10ML) SYRINGE FOR IV PUSH (FOR BLOOD PRESSURE SUPPORT)
PREFILLED_SYRINGE | INTRAVENOUS | Status: DC | PRN
  Administered 2022-03-04 (×2): 80 ug via INTRAVENOUS

## 2022-03-04 MED ORDER — LIDOCAINE 2% (20 MG/ML) 5 ML SYRINGE
INTRAMUSCULAR | Status: DC | PRN
  Administered 2022-03-04: 100 mg via INTRAVENOUS

## 2022-03-04 MED ORDER — PROPOFOL 10 MG/ML IV BOLUS
INTRAVENOUS | Status: AC
  Filled 2022-03-04: qty 20

## 2022-03-04 MED ORDER — SODIUM CHLORIDE 0.9 % IV SOLN
INTRAVENOUS | Status: AC
  Filled 2022-03-04: qty 20

## 2022-03-04 MED ORDER — FENTANYL CITRATE (PF) 100 MCG/2ML IJ SOLN
INTRAMUSCULAR | Status: DC | PRN
  Administered 2022-03-04 (×2): 50 ug via INTRAVENOUS

## 2022-03-04 MED ORDER — OXYCODONE HCL 5 MG PO TABS: 5.0000 mg | ORAL_TABLET | Freq: Once | ORAL | Status: DC | PRN

## 2022-03-04 MED ORDER — OXYCODONE HCL 5 MG/5ML PO SOLN: 5.0000 mg | Freq: Once | ORAL | Status: DC | PRN

## 2022-03-04 SURGICAL SUPPLY — 17 items
BAG URINE DRAIN 2000ML AR STRL (UROLOGICAL SUPPLIES) IMPLANT
BAG URO CATCHER STRL LF (MISCELLANEOUS) ×1 IMPLANT
CATH HEMA 3WAY 30CC 22FR COUDE (CATHETERS) IMPLANT
DRAPE FOOT SWITCH (DRAPES) ×1 IMPLANT
ELECT REM PT RETURN 15FT ADLT (MISCELLANEOUS) ×1 IMPLANT
GLOVE SURG LX STRL 7.5 STRW (GLOVE) ×1 IMPLANT
GOWN STRL REUS W/ TWL XL LVL3 (GOWN DISPOSABLE) ×1 IMPLANT
GOWN STRL REUS W/TWL XL LVL3 (GOWN DISPOSABLE) ×1
KIT TURNOVER KIT A (KITS) IMPLANT
LOOP CUT BIPOLAR 24F LRG (ELECTROSURGICAL) IMPLANT
MANIFOLD NEPTUNE II (INSTRUMENTS) ×1 IMPLANT
PACK CYSTO (CUSTOM PROCEDURE TRAY) ×1 IMPLANT
PENCIL SMOKE EVACUATOR (MISCELLANEOUS) IMPLANT
SYR TOOMEY IRRIG 70ML (MISCELLANEOUS) ×1
SYRINGE TOOMEY IRRIG 70ML (MISCELLANEOUS) IMPLANT
TUBING CONNECTING 10 (TUBING) ×1 IMPLANT
TUBING UROLOGY SET (TUBING) ×1 IMPLANT

## 2022-03-04 NOTE — Progress Notes (Signed)
Patient ID: Joseph Roberts, male   DOB: 04-06-1960, 61 y.o.   MRN: [id number]  Day of Surgery Subjective: Pt with continued hematuria overnight but catheter has been draining.   Objective: Vital signs in last 24 hours: Temp:  [98 F (36.7 C)-98.6 F (37 C)] 98.2 F (36.8 C) (12/31 0512) Pulse Rate:  [70-79] 70 (12/31 0512) Resp:  [18-21] 18 (12/31 0512) BP: (93-115)/(63-76) 105/68 (12/31 0512) SpO2:  [97 %-99 %] 98 % (12/31 0512)  Intake/Output from previous day: 12/30 0701 - 12/31 0700 In: 750 [P.O.:720] Out: 4500 [Urine:4500] Intake/Output this shift: No intake/output data recorded.  Physical Exam:  General: Alert and oriented GU: Tea colored urine in catheter tubing more suggestive of old and not active bleeding  Lab Results: Recent Labs    03/02/22 0457 03/03/22 0542 03/04/22 0522  HGB 8.0* 7.2* 6.8*  HCT 25.1* 22.6* 21.1*   BMET Recent Labs    03/03/22 0542 03/04/22 0522  NA 135 139  K 3.3* 3.8  CL 110 116*  CO2 21* 20*  GLUCOSE 184* 179*  BUN 13 11  CREATININE 0.87 0.75  CALCIUM 8.3* 8.5*     Studies/Results: US PELVIS LIMITED (TRANSABDOMINAL ONLY)  Result Date: 03/02/2022 CLINICAL DATA:  Hematuria. Bladder mass versus is hematoma. EXAM: ULTRASOUND OF THE MALE PELVIS COMPARISON:  CT scan 03/01/2022 FINDINGS: As demonstrated on the CT scan complex hyperechoic material in the bladder lumen. A Foley catheter is also noted. This has more the appearance of intraluminal hematoma but there are some areas of possible vascularity. Recommend correlation with cystoscopy. IMPRESSION: Complex hyperechoic material in the bladder lumen likely hematoma. Recommend correlation with cystoscopy. Electronically Signed   By: Rudie Meyer M.D.   On: 03/02/2022 16:01    Assessment/Plan: 1) Hematuria: Presumably related to recent prostate biopsy and subsequent infection.  Hgb drifting down still but urine looking better.  Based on ultrasound from 12/29, agree with  proceeding to OR for cystoscopy with clot evacuation and fulguration if any bleeding sites are identified.  Transfuse as needed.  I discussed the potential benefits and risks of the procedure, side effects of the proposed treatment, the likelihood of the patient achieving the goals of the procedure, and any potential problems that might occur during the procedure or recuperation.   2) UTI/prostatitis: Continue antibiotic therapy.     LOS: 3 days   Crecencio Mc 03/04/2022, 8:28 AM

## 2022-03-04 NOTE — Op Note (Signed)
Preoperative diagnosis: Hematuria with clot urinary retention  Postoperative diagnosis: Hematuria with clot urinary retention  Procedures: 1.  Cystoscopy 2.  Clot evacuation 3.  Fulguration of prostatic bleeding  Surgeon: Moody Bruins MD  Anesthesia: General  Complications: None  Specimens: None  Intraoperative findings: There was a significant amount of clot noted within the bladder.  No bladder tumors or active bleeding sites were noted within the bladder.  There was diffuse prostatic bleeding that was apparent.  Indication: [name] is a 61 year old gentleman with a history of a prostate biopsy a couple of weeks ago complicated by post procedure infection.  He developed significant hematuria requiring catheterization and continuous bladder irrigation.  He was unable to be irrigated at the bedside yesterday and had enough bleeding that he has required blood transfusion.  He presents today for cystoscopy with clot evacuation and fulguration of any active bleeding sites.  The potential risks, complications, and expected recovery process were discussed.  Informed consent was obtained.  This was obtained with the help of an interpreter.  Description of procedure: The patient was taken to the operating room and a general anesthetic was administered.  He was given preoperative antibiotics, placed in the dorsolithotomy position, and prepped and draped in usual sterile fashion.  He was able to begin his blood transfusion intraoperatively which had been pending from this morning.  A preoperative timeout was performed.  Cystourethroscopy was then performed using the visual obturator and the 26 French resectoscope.  A moderate amount of clot was evacuated from the bladder using a [place] syringe.  Inspection of the bladder was then performed and there were no active bleeding sites within the bladder or evidence of bladder tumors or other mucosal abnormalities.  The prostate was  noted to be quite large with a significant intravesical component.  There did not appear to be any clear bleeding sites from the intravesical component but there was diffuse bleeding from the prostate within the prostatic urethra.  As many of these sites as was thought to be reasonable were fulgurated with the loop cautery.  The scope was then removed and a new 22 [place] three-way hematuria coud catheter was placed.  He was placed on leg traction and continuous bladder irrigation was started.  He tolerated the procedure well without complications.  He was able to be awakened and transferred to the recovery unit in satisfactory condition.

## 2022-03-04 NOTE — Anesthesia Postprocedure Evaluation (Signed)
Anesthesia Post Note  Patient: Joseph Roberts  Procedure(s) Performed: CYSTOSCOPY WITH FULGERATION; CLOT EVACUATION (Urethra)     Patient location during evaluation: PACU Anesthesia Type: General Level of consciousness: awake and alert Pain management: pain level controlled Vital Signs Assessment: post-procedure vital signs reviewed and stable Respiratory status: spontaneous breathing, nonlabored ventilation and respiratory function stable Cardiovascular status: blood pressure returned to baseline and stable Postop Assessment: no apparent nausea or vomiting Anesthetic complications: no   No notable events documented.  Last Vitals:  Vitals:   03/04/22 1135 03/04/22 1145  BP:  110/71  Pulse: 63 63  Resp: 13 12  Temp:    SpO2: 99% 99%    Last Pain:  Vitals:   03/04/22 1145  TempSrc:   PainSc: Asleep                 Lucretia Kern

## 2022-03-04 NOTE — Transfer of Care (Signed)
Immediate Anesthesia Transfer of Care Note  Patient: Joseph Roberts  Procedure(s) Performed: CYSTOSCOPY WITH FULGERATION; CLOT EVACUATION (Urethra)  Patient Location: PACU  Anesthesia Type:General  Level of Consciousness: drowsy  Airway & Oxygen Therapy: Patient Spontanous Breathing and Patient connected to face mask oxygen  Post-op Assessment: Report given to RN and Post -op Vital signs reviewed and stable  Post vital signs: Reviewed and stable  Last Vitals:  Vitals Value Taken Time  BP 104/71 03/04/22 1103  Temp    Pulse 65 03/04/22 1106  Resp 18 03/04/22 1106  SpO2 99 % 03/04/22 1106  Vitals shown include unvalidated device data.  Last Pain:  Vitals:   03/04/22 0800  TempSrc:   PainSc: 0-No pain      Patients Stated Pain Goal: 0 (03/01/22 1531)  Complications: No notable events documented.

## 2022-03-04 NOTE — Progress Notes (Signed)
  Progress Note   Patient: Joseph Roberts JME:[id number] DOB: 05-08-1960 DOA: 02/28/2022     3 DOS: the patient was seen and examined on 03/04/2022   Brief hospital course: 61 y.o. male with medical history significant of DM2, HTN, HLD, GERD, urinary retention w/ chronic foley. Presenting with gross hematuria and suprapubic pain. He reports that he started noticing a decrease in urine output from his chronic foley about a week ago. He was seeing clots and gross blood over the next couple of days. This has worsened as the days have gone on. He has also had suprapubic discomfort during that time. He hasn't had any fevers, N/V/D. He is not on any anticoagulation. He reports that when he began to feel weak yesterday evening, he decided to come to the ED for evaluation. He denies any other aggravating or alleviating factors.    Assessment and Plan: Gross hematuria Foley obstruction/Chronic foley UTI - continued on rocephin -UA suggestive of UTI - urology consulted, appreciate assistance. Now s/p bladder irrigation - follow up on urine culture results and narrow abx accordingly. Urology recs for broad spec abx x 4 weeks -Recently continued with grossly bloody urine output. Pt now s/p cystoscopy with clot evacuation and fulguration of prostatic bleed 12/31 -Hgb down to 6.8, s/p 1 unit PRBC 12/31 with post transfusion hgb of 8.3   DM2 - A1c of 13.3 on most recent check 7/23 -continue  SSI, DM diet, glucose checks -reportedly on 48 units long acting in AM and 44 untis in PM -Currently on 14 units semglee QHS for now. Glycemic trends overall stable, however glucose of 333 this afternoon. Monitor for now, would titrate insulin if glucose remains elevated   BPH - continue home regimen   Normocytic anemia - iron low at 19 with iron sat of 5 -Given IV iron   AKI - Renal function normalized -Recheck bmet in AM   Hyponatremia -Normalized with IVF -Recheck bmet in AM   HLD     -  continue home regimen    GERD     - PPI       Subjective: Seen post-procedure, without complaints  Physical Exam: Vitals:   03/04/22 1130 03/04/22 1135 03/04/22 1145 03/04/22 1318  BP: 111/74  110/71 105/68  Pulse: 62 63 63 63  Resp: 10 13 12 16   Temp: 98.2 F (36.8 C)   (!) 97.4 F (36.3 C)  TempSrc:    Oral  SpO2: 100% 99% 99% 99%  Weight:      Height:       General exam: Awake, laying in bed, in nad Respiratory system: Normal respiratory effort, no wheezing Cardiovascular system: regular rate, s1, s2 Gastrointestinal system: Soft, nondistended, positive BS Central nervous system: CN2-12 grossly intact, strength intact Extremities: Perfused, no clubbing Skin: Normal skin turgor, no notable skin lesions seen Psychiatry: Mood normal // no visual hallucinations   Data Reviewed:  Labs reviewed: na 139, K 3.8, Cr 0.75, Hgb 6.8 -> 8.3   Family Communication: Pt in room, family at bedside  Disposition: Status is: Inpatient Remains inpatient appropriate because: Severity of illness  Planned Discharge Destination: Home    Author: , MD 03/04/2022 5:19 PM  For on call review [email or web address].

## 2022-03-04 NOTE — Anesthesia Preprocedure Evaluation (Addendum)
Anesthesia Evaluation  Patient identified by MRN, date of birth, ID band Patient awake    Reviewed: Allergy & Precautions, NPO status , Patient's Chart, lab work & pertinent test results  History of Anesthesia Complications Negative for: history of anesthetic complications  Airway Mallampati: I  TM Distance: >3 FB Neck ROM: Full    Dental  (+) Dental Advisory Given, Teeth Intact   Pulmonary Current Smoker   Pulmonary exam normal        Cardiovascular hypertension, Normal cardiovascular exam     Neuro/Psych negative neurological ROS     GI/Hepatic Neg liver ROS,GERD  ,,  Endo/Other  diabetes (A1c 13.3), Poorly Controlled, Insulin Dependent, Oral Hypoglycemic Agents    Renal/GU negative Renal ROS   BPH/chronic urinary retention with foley in place, now with gross hematuria and anemia    Musculoskeletal negative musculoskeletal ROS (+)    Abdominal   Peds  Hematology  (+) Blood dyscrasia, anemia Hgb 6.8   Anesthesia Other Findings Trulicity, last dose 02/23/22  Reproductive/Obstetrics                             Anesthesia Physical Anesthesia Plan  ASA: 3 and emergent  Anesthesia Plan: General   Post-op Pain Management:    Induction: Intravenous  PONV Risk Score and Plan: 1 and Ondansetron, Dexamethasone, Treatment may vary due to age or medical condition and Midazolam  Airway Management Planned: LMA  Additional Equipment: None  Intra-op Plan:   Post-operative Plan: Extubation in OR  Informed Consent: I have reviewed the patients History and Physical, chart, labs and discussed the procedure including the risks, benefits and alternatives for the proposed anesthesia with the patient or authorized representative who has indicated his/her understanding and acceptance.     Dental advisory given  Plan Discussed with:   Anesthesia Plan Comments:         Anesthesia Quick  Evaluation

## 2022-03-04 NOTE — Anesthesia Procedure Notes (Signed)
Procedure Name: LMA Insertion Date/Time: 03/04/2022 10:26 AM  Performed by: Wynonia Sours, CRNAPre-anesthesia Checklist: Patient identified, Emergency Drugs available, Suction available, Patient being monitored and Timeout performed Patient Re-evaluated:Patient Re-evaluated prior to induction Oxygen Delivery Method: Circle system utilized Preoxygenation: Pre-oxygenation with 100% oxygen Induction Type: IV induction Ventilation: Mask ventilation without difficulty LMA: LMA with gastric port inserted LMA Size: 4.0 Number of attempts: 1 Placement Confirmation: positive ETCO2 Tube secured with: Tape Dental Injury: Teeth and Oropharynx as per pre-operative assessment

## 2022-03-05 ENCOUNTER — Encounter (HOSPITAL_COMMUNITY): Payer: Self-pay | Admitting: Urology

## 2022-03-05 LAB — COMPREHENSIVE METABOLIC PANEL
ALT: 13 U/L (ref 0–44)
AST: 13 U/L — ABNORMAL LOW (ref 15–41)
Albumin: 2.5 g/dL — ABNORMAL LOW (ref 3.5–5.0)
Alkaline Phosphatase: 68 U/L (ref 38–126)
Anion gap: 7 (ref 5–15)
BUN: 13 mg/dL (ref 6–20)
CO2: 18 mmol/L — ABNORMAL LOW (ref 22–32)
Calcium: 8.6 mg/dL — ABNORMAL LOW (ref 8.9–10.3)
Chloride: 108 mmol/L (ref 98–111)
Creatinine, Ser: 0.78 mg/dL (ref 0.61–1.24)
GFR, Estimated: >60 mL/min (ref >60)
Glucose, Bld: 300 mg/dL — ABNORMAL HIGH (ref 70–99)
Potassium: 3.8 mmol/L (ref 3.5–5.1)
Sodium: 133 mmol/L — ABNORMAL LOW (ref 135–145)
Total Bilirubin: 0.3 mg/dL (ref 0.3–1.2)
Total Protein: 5.8 g/dL — ABNORMAL LOW (ref 6.5–8.1)

## 2022-03-05 LAB — CBC
HCT: 24.8 % — ABNORMAL LOW (ref 39.0–52.0)
Hemoglobin: 8.1 g/dL — ABNORMAL LOW (ref 13.0–17.0)
MCH: 29.6 pg (ref 26.0–34.0)
MCHC: 32.7 g/dL (ref 30.0–36.0)
MCV: 90.5 fL (ref 80.0–100.0)
Platelets: 311 10*3/uL (ref 150–400)
RBC: 2.74 MIL/uL — ABNORMAL LOW (ref 4.22–5.81)
RDW: 14.4 % (ref 11.5–15.5)
WBC: 11 10*3/uL — ABNORMAL HIGH (ref 4.0–10.5)
nRBC: 0 % (ref 0.0–0.2)

## 2022-03-05 LAB — TYPE AND SCREEN
ABO/RH(D): A POS
Antibody Screen: NEGATIVE
Unit division: 0

## 2022-03-05 LAB — BPAM RBC
Blood Product Expiration Date: 202401062359
ISSUE DATE / TIME: 202312310951
Unit Type and Rh: 6200

## 2022-03-05 LAB — GLUCOSE, CAPILLARY
Glucose-Capillary: 329 mg/dL — ABNORMAL HIGH (ref 70–99)
Glucose-Capillary: 155 mg/dL — ABNORMAL HIGH (ref 70–99)
Glucose-Capillary: 207 mg/dL — ABNORMAL HIGH (ref 70–99)
Glucose-Capillary: 194 mg/dL — ABNORMAL HIGH (ref 70–99)

## 2022-03-05 MED ORDER — INSULIN GLARGINE-YFGN 100 UNIT/ML ~~LOC~~ SOLN
44.0000 [IU] | Freq: Every day | SUBCUTANEOUS | Status: DC
  Administered 2022-03-05 – 2022-03-06 (×2): 44 [IU] via SUBCUTANEOUS
  Filled 2022-03-05 (×3): qty 0.44

## 2022-03-05 MED ORDER — INSULIN ASPART 100 UNIT/ML IJ SOLN
5.0000 [IU] | Freq: Three times a day (TID) | INTRAMUSCULAR | Status: DC
  Administered 2022-03-05 – 2022-03-07 (×7): 5 [IU] via SUBCUTANEOUS

## 2022-03-05 MED ORDER — FINASTERIDE 5 MG PO TABS: 5.0000 mg | ORAL_TABLET | Freq: Every day | ORAL | Status: DC

## 2022-03-05 NOTE — Progress Notes (Signed)
  Transition of Care (TOC) Screening Note   Patient Details  Name: Joseph Roberts Date of Birth: February 20, 1961   Transition of Care [facility]) CM/SW Contact:    Lennart Pall, LCSW Phone Number: 03/05/2022, 10:53 AM    Transition of Care Department [facility]) has reviewed patient and no TOC needs have been identified at this time. We will continue to monitor patient advancement through interdisciplinary progression rounds. If new patient transition needs arise, please place a TOC consult.

## 2022-03-05 NOTE — Progress Notes (Signed)
Patient ID: Joseph Roberts, male   DOB: 1960-06-05, 62 y.o.   MRN: [id number]  1 Day Post-Op Subjective: Doing well overnight.  Required hand irrigation by nursing staff one time when catheter was not draining after CBI had been stopped.  Multiple clots removed and CBI started back at slow drip overnight.  Objective: Vital signs in last 24 hours: Temp:  [97.4 F (36.3 C)-98.2 F (36.8 C)] 97.5 F (36.4 C) (01/01 0542) Pulse Rate:  [62-72] 65 (01/01 0542) Resp:  [10-21] 18 (01/01 0542) BP: (104-116)/(68-79) 116/69 (01/01 0542) SpO2:  [97 %-100 %] 99 % (01/01 0542)  Intake/Output from previous day: 12/31 0701 - 01/01 0700 In: 4878.3 [I.V.:2263.3; Blood:315; IV Piggyback:200] Out: 8500 [Urine:8500] Intake/Output this shift: No intake/output data recorded.  Physical Exam:  General: Alert and oriented GU: Urine clear in tubing on slow drip.  Lab Results: Recent Labs    03/04/22 0522 03/04/22 1314 03/05/22 0444  HGB 6.8* 8.3* 8.1*  HCT 21.1* 25.7* 24.8*   BMET Recent Labs    03/04/22 0522 03/05/22 0444  NA 139 133*  K 3.8 3.8  CL 116* 108  CO2 20* 18*  GLUCOSE 179* 300*  BUN 11 13  CREATININE 0.75 0.78  CALCIUM 8.5* 8.6*     Studies/Results: No results found.  Assessment/Plan: 1) Hematuria s/p prostate biopsy and prostatitis: Hgb stable.  Will hold CBI today and monitor.  If urine remains clear, may be able to be discharged home tomorrow with catheter or after voiding trial in the AM.  Continue finasteride upon discharge due to large prostate size and propensity for bleeding.   LOS: 4 days   Dutch Gray 03/05/2022, 7:59 AM

## 2022-03-05 NOTE — Progress Notes (Signed)
  Progress Note   Patient: Joseph Roberts [id number] DOB: April 12, 1960 DOA: 02/28/2022     4 DOS: the patient was seen and examined on 03/05/2022   Brief hospital course: 62 y.o. male with medical history significant of DM2, HTN, HLD, GERD, urinary retention w/ chronic foley. Presenting with gross hematuria and suprapubic pain. He reports that he started noticing a decrease in urine output from his chronic foley about a week ago. He was seeing clots and gross blood over the next couple of days. This has worsened as the days have gone on. He has also had suprapubic discomfort during that time. He hasn't had any fevers, N/V/D. He is not on any anticoagulation. He reports that when he began to feel weak yesterday evening, he decided to come to the ED for evaluation. He denies any other aggravating or alleviating factors.    Assessment and Plan: Gross hematuria Foley obstruction/Chronic foley UTI - continued on rocephin -UA suggestive of UTI - urology consulted, appreciate assistance. Now s/p bladder irrigation - follow up on urine culture results and narrow abx accordingly. Urology recs for broad spec abx x 4 weeks -Recently continued with grossly bloody urine output. Pt now s/p cystoscopy with clot evacuation and fulguration of prostatic bleed 12/31 -Hgb down to 6.8 on 12/31, s/p 1 unit PRBC with post transfusion hgb of 8.3 -Hgb remains stable this AM. Per Urology, hold CBI today and monitor, if urine remains clear, may be able to be d/c in AM   DM2 - A1c of 13.3 on most recent check 7/23 -continue  SSI, DM diet, glucose checks -reportedly on 44 untis QHS -Currently on 14 units semglee QHS with glucose into the 300's overnight. -Will continue 44 units long-acting QHS. Have added 5 units meal coverage in addition to SSI   BPH - continue home regimen   Normocytic anemia - iron low at 19 with iron sat of 5 -Given IV iron this visit   AKI - Renal function normalized -Recheck bmet  in AM   Hyponatremia -Normalized with IVF -Recheck bmet in AM   HLD     - continue home regimen    GERD     - PPI       Subjective: Reports some bladder "fullness" this afternoon. Eating pistachio nuts when seen, tolerating  Physical Exam: Vitals:   03/04/22 1955 03/05/22 0542 03/05/22 0850 03/05/22 1316  BP: 113/73 116/69 117/81 114/71  Pulse: 72 65 72 82  Resp: 18 18 18 18   Temp: 97.7 F (36.5 C) (!) 97.5 F (36.4 C) 98 F (36.7 C) 98.4 F (36.9 C)  TempSrc: Oral Oral Oral Oral  SpO2: 97% 99% 98% 99%  Weight:      Height:       General exam: Conversant, in no acute distress Respiratory system: normal chest rise, clear, no audible wheezing Cardiovascular system: regular rhythm, s1-s2 Gastrointestinal system: Nondistended, nontender, pos BS Central nervous system: No seizures, no tremors Extremities: No cyanosis, no joint deformities Skin: No rashes, no pallor Psychiatry: Affect normal // no auditory hallucinations   Data Reviewed:  Labs reviewed: Na 133, K 3.8, Cr 0.78, Hgb 8.1    Family Communication: Pt in room, family at bedside  Disposition: Status is: Inpatient Remains inpatient appropriate because: Severity of illness  Planned Discharge Destination: Home    Author: Marylu Lund, MD 03/05/2022 5:02 PM  For on call review [email or web address].

## 2022-03-06 ENCOUNTER — Other Ambulatory Visit: Payer: Self-pay

## 2022-03-06 LAB — GLUCOSE, CAPILLARY
Glucose-Capillary: 172 mg/dL — ABNORMAL HIGH (ref 70–99)
Glucose-Capillary: 78 mg/dL (ref 70–99)
Glucose-Capillary: 232 mg/dL — ABNORMAL HIGH (ref 70–99)
Glucose-Capillary: 241 mg/dL — ABNORMAL HIGH (ref 70–99)

## 2022-03-06 LAB — COMPREHENSIVE METABOLIC PANEL
ALT: 13 U/L (ref 0–44)
AST: 13 U/L — ABNORMAL LOW (ref 15–41)
Albumin: 2.9 g/dL — ABNORMAL LOW (ref 3.5–5.0)
Alkaline Phosphatase: 91 U/L (ref 38–126)
Anion gap: 7 (ref 5–15)
BUN: 12 mg/dL (ref 6–20)
CO2: 21 mmol/L — ABNORMAL LOW (ref 22–32)
Calcium: 9.2 mg/dL (ref 8.9–10.3)
Chloride: 109 mmol/L (ref 98–111)
Creatinine, Ser: 0.91 mg/dL (ref 0.61–1.24)
GFR, Estimated: >60 mL/min (ref >60)
Glucose, Bld: 147 mg/dL — ABNORMAL HIGH (ref 70–99)
Potassium: 4.4 mmol/L (ref 3.5–5.1)
Sodium: 137 mmol/L (ref 135–145)
Total Bilirubin: 0.4 mg/dL (ref 0.3–1.2)
Total Protein: 6.8 g/dL (ref 6.5–8.1)

## 2022-03-06 LAB — CBC
HCT: 28 % — ABNORMAL LOW (ref 39.0–52.0)
Hemoglobin: 9.3 g/dL — ABNORMAL LOW (ref 13.0–17.0)
MCH: 29.8 pg (ref 26.0–34.0)
MCHC: 33.2 g/dL (ref 30.0–36.0)
MCV: 89.7 fL (ref 80.0–100.0)
Platelets: 377 10*3/uL (ref 150–400)
RBC: 3.12 MIL/uL — ABNORMAL LOW (ref 4.22–5.81)
RDW: 14.7 % (ref 11.5–15.5)
WBC: 8.8 10*3/uL (ref 4.0–10.5)
nRBC: 0 % (ref 0.0–0.2)

## 2022-03-06 MED ORDER — FINASTERIDE 5 MG PO TABS
5.0000 mg | ORAL_TABLET | Freq: Every day | ORAL | 0 refills | Status: AC
  Filled 2022-03-06: qty 30, 30d supply, fill #0

## 2022-03-06 NOTE — Progress Notes (Signed)
Patient ID: Joseph Roberts, male   DOB: March 23, 1960, 62 y.o.   MRN: [id number]  2 Days Post-Op Subjective: Doing well overnight. Afebrile and HDS.  Urine culture no growth, being treated w/ceftriaxone based on prior klebsiella UTI. Hgb stable w/o need for repeat transfusion. CBI stopped and urine clear yellow  Objective: Vital signs in last 24 hours: Temp:  [97.7 F (36.5 C)-98.4 F (36.9 C)] 97.7 F (36.5 C) (01/02 1256) Pulse Rate:  [62-67] 67 (01/02 1256) Resp:  [16-18] 16 (01/02 1256) BP: (106-119)/(74-77) 118/74 (01/02 1256) SpO2:  [99 %-100 %] 99 % (01/02 1256)  Intake/Output from previous day: 01/01 0701 - 01/02 0700 In: 540 [P.O.:540] Out: 2650 [Urine:2650] Intake/Output this shift: Total I/O In: 3019.7 [I.V.:2754; IV Piggyback:265.7] Out: 1050 [Urine:1050]  Physical Exam:  General: Alert and oriented GU: Urine clear yellow  Lab Results: Recent Labs    03/04/22 1314 03/05/22 0444 03/06/22 0421  HGB 8.3* 8.1* 9.3*  HCT 25.7* 24.8* 28.0*    BMET Recent Labs    03/05/22 0444 03/06/22 0421  NA 133* 137  K 3.8 4.4  CL 108 109  CO2 18* 21*  GLUCOSE 300* 147*  BUN 13 12  CREATININE 0.78 0.91  CALCIUM 8.6* 9.2      Studies/Results: No results found.  Assessment/Plan: 1) Hematuria s/p prostate biopsy and prostatitis: Hgb stable.  Will hold CBI today and monitor.  2) will assess urine early AM tomorrow 03/07/22, if clear ok for void trial and discharge if pass 3) would recommend 14 days antibiotics based on prior urine culture (Klebsiella from 12/27)   LOS: 5 days   Florentina Addison 03/06/2022, 3:40 PM

## 2022-03-06 NOTE — Progress Notes (Signed)
  Progress Note   Patient: Joseph Roberts [id number] DOB: Nov 17, 1960 DOA: 02/28/2022     5 DOS: the patient was seen and examined on 03/06/2022   Brief hospital course: 62 y.o. male with medical history significant of DM2, HTN, HLD, GERD, urinary retention w/ chronic foley. Presenting with gross hematuria and suprapubic pain. He reports that he started noticing a decrease in urine output from his chronic foley about a week ago. He was seeing clots and gross blood over the next couple of days. This has worsened as the days have gone on. He has also had suprapubic discomfort during that time. He hasn't had any fevers, N/V/D. He is not on any anticoagulation. He reports that when he began to feel weak yesterday evening, he decided to come to the ED for evaluation. He denies any other aggravating or alleviating factors.    Assessment and Plan: Gross hematuria Foley obstruction/Chronic foley Prostatitis - continued on rocephin. Urology recs 14 days of abx empirically -UA suggestive of UTI -Hgb down to 6.8 on 12/31 with grossly bloody urine. Pt now s/p 1 unit PRBC and cystoscopy with clot evacuation and fulguration of prostatic bleed on 12/31 -Hgb has remained stable and urine appears clear -Per Urology, plan to reassess tomorrow AM and possible voiding trial at that time. If passes, then d/c   DM2 - A1c of 13.3 on most recent check 7/23 -continue  SSI, DM diet, glucose checks -on 44 untis long-acting QHS -cont home dosing for now -Have added 5 units meal coverage   BPH - continue on finasteride - Voiding trial tomorrow per Urology   Normocytic anemia - iron low at 19 with iron sat of 5 this visit -Now s/p IV iron   AKI - Renal function normalized -Recheck bmet in AM   Hyponatremia -Normalized with IVF -Recheck bmet in AM   HLD     - continue home regimen    GERD     - PPI       Subjective: Feeling better today. No further bleeding. Eager to go home  soon  Physical Exam: Vitals:   03/05/22 1316 03/05/22 2305 03/06/22 0502 03/06/22 1256  BP: 114/71 119/76 106/77 118/74  Pulse: 82 66 62 67  Resp: 18 18 18 16   Temp: 98.4 F (36.9 C) 98.4 F (36.9 C) 97.7 F (36.5 C) 97.7 F (36.5 C)  TempSrc: Oral Oral Oral Oral  SpO2: 99% 100% 99% 99%  Weight:      Height:       General exam: Awake, laying in bed, in nad Respiratory system: Normal respiratory effort, no wheezing Cardiovascular system: regular rate, s1, s2 Gastrointestinal system: Soft, nondistended, positive BS Central nervous system: CN2-12 grossly intact, strength intact Extremities: Perfused, no clubbing Skin: Normal skin turgor, no notable skin lesions seen Psychiatry: Mood normal // no visual hallucinations   Data Reviewed:  Labs reviewed: Na 137, K 4.4, Cr 0.91, Hgb 9.3    Family Communication: Pt in room, family at bedside  Disposition: Status is: Inpatient Remains inpatient appropriate because: Severity of illness  Planned Discharge Destination: Home    Author: Marylu Lund, MD 03/06/2022 5:03 PM  For on call review [email or web address].

## 2022-03-07 ENCOUNTER — Other Ambulatory Visit: Payer: Self-pay

## 2022-03-07 ENCOUNTER — Other Ambulatory Visit (HOSPITAL_COMMUNITY): Payer: Self-pay

## 2022-03-07 LAB — COMPREHENSIVE METABOLIC PANEL
ALT: 13 U/L (ref 0–44)
AST: 14 U/L — ABNORMAL LOW (ref 15–41)
Albumin: 2.9 g/dL — ABNORMAL LOW (ref 3.5–5.0)
Alkaline Phosphatase: 101 U/L (ref 38–126)
Anion gap: 10 (ref 5–15)
BUN: 14 mg/dL (ref 6–20)
CO2: 19 mmol/L — ABNORMAL LOW (ref 22–32)
Calcium: 8.8 mg/dL — ABNORMAL LOW (ref 8.9–10.3)
Chloride: 107 mmol/L (ref 98–111)
Creatinine, Ser: 0.82 mg/dL (ref 0.61–1.24)
GFR, Estimated: >60 mL/min (ref >60)
Glucose, Bld: 152 mg/dL — ABNORMAL HIGH (ref 70–99)
Potassium: 3.6 mmol/L (ref 3.5–5.1)
Sodium: 136 mmol/L (ref 135–145)
Total Bilirubin: 0.4 mg/dL (ref 0.3–1.2)
Total Protein: 6.7 g/dL (ref 6.5–8.1)

## 2022-03-07 LAB — CBC
HCT: 29.9 % — ABNORMAL LOW (ref 39.0–52.0)
Hemoglobin: 9.6 g/dL — ABNORMAL LOW (ref 13.0–17.0)
MCH: 29.1 pg (ref 26.0–34.0)
MCHC: 32.1 g/dL (ref 30.0–36.0)
MCV: 90.6 fL (ref 80.0–100.0)
Platelets: 386 10*3/uL (ref 150–400)
RBC: 3.3 MIL/uL — ABNORMAL LOW (ref 4.22–5.81)
RDW: 15.2 % (ref 11.5–15.5)
WBC: 9.4 10*3/uL (ref 4.0–10.5)
nRBC: 0 % (ref 0.0–0.2)

## 2022-03-07 LAB — GLUCOSE, CAPILLARY
Glucose-Capillary: 167 mg/dL — ABNORMAL HIGH (ref 70–99)
Glucose-Capillary: 185 mg/dL — ABNORMAL HIGH (ref 70–99)

## 2022-03-07 MED ORDER — CEFDINIR 300 MG PO CAPS
300.0000 mg | ORAL_CAPSULE | Freq: Two times a day (BID) | ORAL | 0 refills | Status: AC
  Filled 2022-03-07: qty 14, 7d supply, fill #0

## 2022-03-07 MED ORDER — TAMSULOSIN HCL 0.4 MG PO CAPS
0.4000 mg | ORAL_CAPSULE | Freq: Every day | ORAL | 3 refills | Status: DC
  Filled 2022-03-07: qty 90, 90d supply, fill #0

## 2022-03-07 NOTE — Progress Notes (Addendum)
Patient ID: Joseph Roberts, male   DOB: 18-Feb-1961, 62 y.o.   MRN: [id number]  3 Days Post-Op Subjective: No acute events overnight. Hgb uptrending, creatinine stable. Urine remains clear yellow  Objective: Vital signs in last 24 hours: Temp:  [97.7 F (36.5 C)-98 F (36.7 C)] 97.8 F (36.6 C) (01/03 0524) Pulse Rate:  [63-73] 63 (01/03 0524) Resp:  [16-18] 16 (01/03 0524) BP: (106-118)/(73-79) 107/79 (01/03 0524) SpO2:  [99 %-100 %] 100 % (01/03 0524)  Intake/Output from previous day: 01/02 0701 - 01/03 0700 In: 3019.7 [I.V.:2754; IV Piggyback:265.7] Out: 2850 [Urine:2850] Intake/Output this shift: No intake/output data recorded.  Physical Exam:  General: Alert and oriented GU: Urine clear yellow  Lab Results: Recent Labs    03/05/22 0444 03/06/22 0421 03/07/22 0358  HGB 8.1* 9.3* 9.6*  HCT 24.8* 28.0* 29.9*    BMET Recent Labs    03/06/22 0421 03/07/22 0358  NA 137 136  K 4.4 3.6  CL 109 107  CO2 21* 19*  GLUCOSE 147* 152*  BUN 12 14  CREATININE 0.91 0.82  CALCIUM 9.2 8.8*      Studies/Results: No results found.  Assessment/Plan: 1) Hematuria s/p prostate biopsy and prostatitis: Hgb stable.  OK for catheter removal and void trial today, order placed 2) would recommend 14 days antibiotics based on prior urine culture (Klebsiella from 12/27) 3) Please continue finasteride and flomax on discharge 4) If unable to void, ok to replace catheter and discharge with catheter in place. 4) follow-up with urology in 2-3 weeks.   LOS: 6 days   Florentina Addison 03/07/2022, 7:49 AM

## 2022-03-07 NOTE — Discharge Summary (Signed)
Physician Discharge Summary  Joseph Roberts JJK:[id number] DOB: 1960/07/12 DOA: 02/28/2022  PCP: Kerin Perna, NP  Admit date: 02/28/2022  Discharge date: 03/07/2022  Admitted From:Home  Disposition:  Home  Recommendations for Outpatient Follow-up:  Follow up with PCP in 1-2 weeks Continue on Omnicef for 7 days to complete total 14 day course of treatment for Klebsiella UTI Follow up with Urology in 2-3 weeks as recommended Remain on Flomax and Finasteride along with other home meds as prior  Home Health:None  Equipment/Devices:None  Discharge Condition:Stable  CODE STATUS: Full  Diet recommendation: Heart Healthy/Carb Modified  Brief/Interim Summary: 62 y.o. male with medical history significant of DM2, HTN, HLD, GERD, urinary retention w/ chronic foley. Presenting with gross hematuria and suprapubic pain. He reports that he started noticing a decrease in urine output from his chronic foley about a week ago. He was seeing clots and gross blood over the next couple of days. This has worsened as the days have gone on. He has also had suprapubic discomfort during that time. He was seen by Urology and underwent cystoscopy with clot evacuation and fulguration of prostatic bleed on 12/31 and also required 1 unit PRBC transfusion due to the associated acute blood loss anemia. He is now voiding without difficulty and has been recommended to remain on antibiotics for treatment of Klebsiella UTI as noted above and will follow up with Urology outpatient in 2-3 weeks.  Discharge Diagnoses:  Principal Problem:   Gross hematuria Active Problems:   GERD (gastroesophageal reflux disease)   Type 2 diabetes mellitus with hyperglycemia (HCC)   AKI (acute kidney injury) ([place])   Dyslipidemia   BPH (benign prostatic hyperplasia)   Obstruction of Foley catheter (HCC)   UTI (urinary tract infection)   Hyponatremia   Normocytic anemia  Principal discharge diagnosis: Hematuria s/p  prostate biopsy and prostatitis with Klebsiella UTI.  Discharge Instructions  Discharge Instructions     Diet - low sodium heart healthy   Complete by: As directed    Increase activity slowly   Complete by: As directed       Allergies as of 03/07/2022   No Known Allergies      Medication List     STOP taking these medications    meloxicam 7.5 MG tablet Commonly known as: MOBIC   oxybutynin 5 MG 24 hr tablet Commonly known as: DITROPAN-XL       TAKE these medications    Basaglar KwikPen 100 UNIT/ML Inject 48 Units into the skin daily. What changed:  how much to take when to take this   cefdinir 300 MG capsule Commonly known as: OMNICEF Take 1 capsule (300 mg total) by mouth 2 (two) times daily for 7 days.   finasteride 5 MG tablet Commonly known as: PROSCAR Tome 1 tableta (5 mg en total) por va oral diariamente. (Take 1 tablet (5 mg total) by mouth daily.)   gemfibrozil 600 MG tablet Commonly known as: Lopid Take 1 tablet (600 mg total) by mouth 2 (two) times daily before a meal.   Insulin Syringe-Needle U-100 31G X 5/16" 0.3 ML Misc Commonly known as: TRUEplus Insulin Syringe Use to inject Humalog TID.   lisinopril 5 MG tablet Commonly known as: ZESTRIL Tome 1 tableta (5 mg en total) por va oral diariamente. (Take 1 tablet (5 mg total) by mouth daily.)   metFORMIN 500 MG 24 hr tablet Commonly known as: GLUCOPHAGE-XR Take 2 tablets (1,000 mg total) by mouth 2 (two) times daily.   ONE  TOUCH ULTRA MINI w/Device Kit 1 each by Does not apply route daily. Test fasting daily   True Metrix Meter w/Device Kit Use as instructed. Check blood glucose level by fingerstick three times per day.   tamsulosin 0.4 MG Caps capsule Commonly known as: FLOMAX Tome una capsula por va oral. (Take 1 capsule (0.4 mg total) by mouth daily.) What changed: Another medication with the same name was removed. Continue taking this medication, and follow the directions you  see here.   TechLite Pen Needles 31G X 5 MM Misc Generic drug: Insulin Pen Needle use as directed to inject insulin 4 times daily   traMADol 50 MG tablet Commonly known as: ULTRAM take 1-2 tablets by mouth every 6 hours as needed What changed:  how much to take how to take this when to take this reasons to take this   True Metrix Blood Glucose Test test strip Generic drug: glucose blood Use as instructed. Check blood glucose level by fingerstick three times per day.   TRUEplus Lancets 28G Misc Use as instructed. Check blood glucose level by fingerstick three times per day.   Trulicity 3 TZ/0.0FV Sopn Generic drug: Dulaglutide Inject 3 mg as directed once a week. What changed: when to take this        Follow-up Information     Kerin Perna, NP Follow up in 2 week(s).   Specialty: Internal Medicine Why: Hospital follow up Contact information: [id number] [id number] [place] Follow up.   Why: as scheduled Contact information: [place] [phone]               No Known Allergies  Consultations: Urology   Procedures/Studies: US PELVIS LIMITED (TRANSABDOMINAL ONLY)  Result Date: 03/02/2022 CLINICAL DATA:  Hematuria. Bladder mass versus is hematoma. EXAM: ULTRASOUND OF THE MALE PELVIS COMPARISON:  CT scan 03/01/2022 FINDINGS: As demonstrated on the CT scan complex hyperechoic material in the bladder lumen. A Foley catheter is also noted. This has more the appearance of intraluminal hematoma but there are some areas of possible vascularity. Recommend correlation with cystoscopy. IMPRESSION: Complex hyperechoic material in the bladder lumen likely hematoma. Recommend correlation with cystoscopy. Electronically Signed   By: Marijo Sanes M.D.   On: 03/02/2022 16:01   CT ABDOMEN PELVIS W CONTRAST  Result Date: 03/01/2022 CLINICAL DATA:  Gross  macroscopic hematuria EXAM: CT ABDOMEN AND PELVIS WITH CONTRAST TECHNIQUE: Multidetector CT imaging of the abdomen and pelvis was performed using the standard protocol following bolus administration of intravenous contrast. RADIATION DOSE REDUCTION: This exam was performed according to the departmental dose-optimization program which includes automated exposure control, adjustment of the mA and/or kV according to patient size and/or use of iterative reconstruction technique. CONTRAST:  154m OMNIPAQUE IOHEXOL 300 MG/ML  SOLN COMPARISON:  CT 10/23/2013 FINDINGS: Lower chest: Bibasilar atelectasis/scarring.  No acute abnormality. Hepatobiliary: No suspicious liver lesion. Decompressed gallbladder. No biliary dilation. Pancreas: Unremarkable. No pancreatic ductal dilatation or surrounding inflammatory changes. Spleen: Unremarkable. Adrenals/Urinary Tract: Normal adrenal glands. No urinary calculi or hydronephrosis. Heterogenous density fluid within the bladder compatible with blood clots. Foley catheter and gas within the bladder. Mild bladder wall thickening. Stomach/Bowel: Colonic diverticulosis without diverticulitis. There is similar focal concentric thickening of the proximal sigmoid colon wall. This is not substantially changed from 2015. Mildly prominent loops of small bowel in the central abdomen with fecalization of small bowel  contents (circa series 4/image 41). No definite discrete transition point though earlier partial small bowel obstruction is difficult to exclude. The small bowel in this area measures up to 2.6 cm in diameter. Normal appendix. Unremarkable stomach. Vascular/Lymphatic: There are again a few prominent lymph nodes about the area of sigmoid colon wall thickening. For example 7 mm lymph node posterior to the sigmoid colon (2/57). Unremarkable aorta. Reproductive: Enlarged prostate. Other: Trace free fluid in the pelvis.  No free intraperitoneal air. Musculoskeletal: No acute fracture.  IMPRESSION: 1. Heterogenous fluid within the bladder compatible with blood clots. 2. Mild bladder wall thickening may be due to obstructive uropathy given enlargement of the prostate versus cystitis. 3. Mildly prominent loops of small bowel in the central abdomen with fecalization of small bowel contents (circa series 4/image 41). No definite discrete transition point though earlier partial small bowel obstruction is difficult to exclude. 4. Similar focal wall thickening of the proximal sigmoid colon compared to 2015. Given long-term stability this is unlikely neoplastic and may be a chronic normal finding or due to mild diverticulitis. Electronically Signed   By: Placido Sou M.D.   On: 03/01/2022 02:35     Discharge Exam: Vitals:   03/07/22 0524 03/07/22 1255  BP: 107/79 118/75  Pulse: 63 63  Resp: 16 20  Temp: 97.8 F (36.6 C) (!) 97.5 F (36.4 C)  SpO2: 100% 100%   Vitals:   03/06/22 1256 03/06/22 2025 03/07/22 0524 03/07/22 1255  BP: 118/74 106/73 107/79 118/75  Pulse: 67 73 63 63  Resp: _0 Temp: 97.7 F (36.5 C) 98 F (36.7 C) 97.8 F (36.6 C) (!) 97.5 F (36.4 C)  TempSrc: Oral Oral Oral Oral  SpO2: 99% 99% 100% 100%  Weight:      Height:        General: Pt is alert, awake, not in acute distress Cardiovascular: RRR, S1/S2 +, no rubs, no gallops Respiratory: CTA bilaterally, no wheezing, no rhonchi Abdominal: Soft, NT, ND, bowel sounds + Extremities: no edema, no cyanosis    The results of significant diagnostics from this hospitalization (including imaging, microbiology, ancillary and laboratory) are listed below for reference.     Microbiology: Recent Results (from the past 240 hour(s))  Urine Culture     Status: Abnormal   Collection Time: 02/28/22  8:51 PM   Specimen: Urine, Clean Catch  Result Value Ref Range Status   Specimen Description   Final    URINE, CLEAN CATCH Performed at [facility], [place] [address],  [place], [place] [address]    Special Requests   Final    NONE Performed at [facility], [place] [address] [place], [place] [address]    Culture (A)  Final    <10,000 COLONIES/mL INSIGNIFICANT GROWTH Performed at [place] [address] [place], [place] [address]    Report Status 03/01/2022 FINAL  Final  Resp panel by RT-PCR (RSV, Flu A&B, Covid) Anterior Nasal Swab     Status: None   Collection Time: 03/02/22 11:38 AM   Specimen: Anterior Nasal Swab  Result Value Ref Range Status   SARS Coronavirus 2 by RT PCR NEGATIVE NEGATIVE Final    Comment: (NOTE) SARS-CoV-2 target nucleic acids are NOT DETECTED.  The SARS-CoV-2 RNA is generally detectable in upper respiratory specimens during the acute phase of infection. The lowest concentration of SARS-CoV-2 viral copies this assay can detect is 138 copies/mL. A negative result does not preclude  SARS-Cov-2 infection and should not be used as the sole basis for treatment or other patient management decisions. A negative result may occur with  improper specimen collection/handling, submission of specimen other than nasopharyngeal swab, presence of viral mutation(s) within the areas targeted by this assay, and inadequate number of viral copies(<138 copies/mL). A negative result must be combined with clinical observations, patient history, and epidemiological information. The expected result is Negative.  Fact Sheet for Patients:  EntrepreneurPulse.com.au  Fact Sheet for Healthcare Providers:  IncredibleEmployment.be  This test is no t yet approved or cleared by the [place] FDA and  has been authorized for detection and/or diagnosis of SARS-CoV-2 by FDA under an Emergency Use Authorization (EUA). This EUA will remain  in effect (meaning this test can be used) for the duration of the COVID-19 declaration under Section 564(b)(1) of the Act, 21 U.S.C.section 360bbb-3(b)(1),  unless the authorization is terminated  or revoked sooner.       Influenza A by PCR NEGATIVE NEGATIVE Final   Influenza B by PCR NEGATIVE NEGATIVE Final    Comment: (NOTE) The Xpert Xpress SARS-CoV-2/FLU/RSV plus assay is intended as an aid in the diagnosis of influenza from Nasopharyngeal swab specimens and should not be used as a sole basis for treatment. Nasal washings and aspirates are unacceptable for Xpert Xpress SARS-CoV-2/FLU/RSV testing.  Fact Sheet for Patients: EntrepreneurPulse.com.au  Fact Sheet for Healthcare Providers: IncredibleEmployment.be  This test is not yet approved or cleared by the [place] FDA and has been authorized for detection and/or diagnosis of SARS-CoV-2 by FDA under an Emergency Use Authorization (EUA). This EUA will remain in effect (meaning this test can be used) for the duration of the COVID-19 declaration under Section 564(b)(1) of the Act, 21 U.S.C. section 360bbb-3(b)(1), unless the authorization is terminated or revoked.     Resp Syncytial Virus by PCR NEGATIVE NEGATIVE Final    Comment: (NOTE) Fact Sheet for Patients: EntrepreneurPulse.com.au  Fact Sheet for Healthcare Providers: IncredibleEmployment.be  This test is not yet approved or cleared by the [place] FDA and has been authorized for detection and/or diagnosis of SARS-CoV-2 by FDA under an Emergency Use Authorization (EUA). This EUA will remain in effect (meaning this test can be used) for the duration of the COVID-19 declaration under Section 564(b)(1) of the Act, 21 U.S.C. section 360bbb-3(b)(1), unless the authorization is terminated or revoked.  Performed at [facility], [place] [address] [place], [place] [address]      Labs: BNP (last 3 results) No results for input(s): "BNP" in the last 8760 hours. Basic Metabolic Panel: Recent Labs  Lab 03/03/22 0542  03/04/22 0522 03/05/22 0444 03/06/22 0421 03/07/22 0358  NA 135 139 133* 137 136  K 3.3* 3.8 3.8 4.4 3.6  CL 110 116* 108 109 107  CO2 21* 20* 18* 21* 19*  GLUCOSE 184* 179* 300* 147* 152*  BUN _0 CREATININE 0.87 0.75 0.78 0.91 0.82  CALCIUM 8.3* 8.5* 8.6* 9.2 8.8*  MG 1.8  --   --   --   --    Liver Function Tests: Recent Labs  Lab 03/03/22 0542 03/04/22 0522 03/05/22 0444 03/06/22 0421 03/07/22 0358  AST 13* 12* 13* 13* 14*  ALT _1 ALKPHOS 71 65 68 91 101  BILITOT 0.5 0.2* 0.3 0.4 0.4  PROT 5.7* 5.6* 5.8* 6.8 6.7  ALBUMIN 2.4* 2.1* 2.5* 2.9* 2.9*   No results for input(s): "LIPASE", "AMYLASE" in  the last 168 hours. No results for input(s): "AMMONIA" in the last 168 hours. CBC: Recent Labs  Lab 02/28/22 2001 03/01/22 1602 03/03/22 0542 03/04/22 0522 03/04/22 1314 03/05/22 0444 03/06/22 0421 03/07/22 0358  WBC 25.7*   < > 10.1 6.0  --  11.0* 8.8 9.4  NEUTROABS 22.9*  --   --   --   --   --   --   --   HGB 10.5*   < > 7.2* 6.8* 8.3* 8.1* 9.3* 9.6*  HCT 31.7*   < > 22.6* 21.1* 25.7* 24.8* 28.0* 29.9*  MCV 89.0   < > 89.0 90.2  --  90.5 89.7 90.6  PLT 427*   < > 295 280  --  311 377 386   < > = values in this interval not displayed.   Cardiac Enzymes: No results for input(s): "CKTOTAL", "CKMB", "CKMBINDEX", "TROPONINI" in the last 168 hours. BNP: Invalid input(s): "POCBNP" CBG: Recent Labs  Lab 03/06/22 1104 03/06/22 1617 03/06/22 2146 03/07/22 0731 03/07/22 1111  GLUCAP 78 232* 241* 167* 185*   D-Dimer No results for input(s): "DDIMER" in the last 72 hours. Hgb A1c No results for input(s): "HGBA1C" in the last 72 hours. Lipid Profile No results for input(s): "CHOL", "HDL", "LDLCALC", "TRIG", "CHOLHDL", "LDLDIRECT" in the last 72 hours. Thyroid function studies No results for input(s): "TSH", "T4TOTAL", "T3FREE", "THYROIDAB" in the last 72 hours.  Invalid input(s): "FREET3" Anemia work up No results for input(s):  "VITAMINB12", "FOLATE", "FERRITIN", "TIBC", "IRON", "RETICCTPCT" in the last 72 hours. Urinalysis    Component Value Date/Time   COLORURINE RED (A) 02/28/2022 2051   APPEARANCEUR TURBID (A) 02/28/2022 2051   APPEARANCEUR Clear 07/07/2021 0910   LABSPEC  02/28/2022 2051    TEST NOT REPORTED DUE TO COLOR INTERFERENCE OF URINE PIGMENT   PHURINE  02/28/2022 2051    TEST NOT REPORTED DUE TO COLOR INTERFERENCE OF URINE PIGMENT   GLUCOSEU (A) 02/28/2022 2051    TEST NOT REPORTED DUE TO COLOR INTERFERENCE OF URINE PIGMENT   HGBUR (A) 02/28/2022 2051    TEST NOT REPORTED DUE TO COLOR INTERFERENCE OF URINE PIGMENT   BILIRUBINUR (A) 02/28/2022 2051    TEST NOT REPORTED DUE TO COLOR INTERFERENCE OF URINE PIGMENT   BILIRUBINUR Negative 07/07/2021 0910   KETONESUR (A) 02/28/2022 2051    TEST NOT REPORTED DUE TO COLOR INTERFERENCE OF URINE PIGMENT   PROTEINUR (A) 02/28/2022 2051    TEST NOT REPORTED DUE TO COLOR INTERFERENCE OF URINE PIGMENT   UROBILINOGEN 0.2 11/21/2020 1531   UROBILINOGEN 0.2 10/23/2013 0838   NITRITE (A) 02/28/2022 2051    TEST NOT REPORTED DUE TO COLOR INTERFERENCE OF URINE PIGMENT   LEUKOCYTESUR (A) 02/28/2022 2051    TEST NOT REPORTED DUE TO COLOR INTERFERENCE OF URINE PIGMENT   Sepsis Labs Recent Labs  Lab 03/04/22 0522 03/05/22 0444 03/06/22 0421 03/07/22 0358  WBC 6.0 11.0* 8.8 9.4   Microbiology Recent Results (from the past 240 hour(s))  Urine Culture     Status: Abnormal   Collection Time: 02/28/22  8:51 PM   Specimen: Urine, Clean Catch  Result Value Ref Range Status   Specimen Description   Final    URINE, CLEAN CATCH Performed at [facility], [place] [address], [place], [place] [address]    Special Requests   Final    NONE Performed at [facility], [place] [address], [place], [place] [address]    Culture (A)  Final    <  10,000 COLONIES/mL INSIGNIFICANT GROWTH Performed at [facility], [place] [address],  [place], [place] [address]    Report Status 03/01/2022 FINAL  Final  Resp panel by RT-PCR (RSV, Flu A&B, Covid) Anterior Nasal Swab     Status: None   Collection Time: 03/02/22 11:38 AM   Specimen: Anterior Nasal Swab  Result Value Ref Range Status   SARS Coronavirus 2 by RT PCR NEGATIVE NEGATIVE Final    Comment: (NOTE) SARS-CoV-2 target nucleic acids are NOT DETECTED.  The SARS-CoV-2 RNA is generally detectable in upper respiratory specimens during the acute phase of infection. The lowest concentration of SARS-CoV-2 viral copies this assay can detect is 138 copies/mL. A negative result does not preclude SARS-Cov-2 infection and should not be used as the sole basis for treatment or other patient management decisions. A negative result may occur with  improper specimen collection/handling, submission of specimen other than nasopharyngeal swab, presence of viral mutation(s) within the areas targeted by this assay, and inadequate number of viral copies(<138 copies/mL). A negative result must be combined with clinical observations, patient history, and epidemiological information. The expected result is Negative.  Fact Sheet for Patients:  EntrepreneurPulse.com.au  Fact Sheet for Healthcare Providers:  IncredibleEmployment.be  This test is no t yet approved or cleared by the [place] FDA and  has been authorized for detection and/or diagnosis of SARS-CoV-2 by FDA under an Emergency Use Authorization (EUA). This EUA will remain  in effect (meaning this test can be used) for the duration of the COVID-19 declaration under Section 564(b)(1) of the Act, 21 U.S.C.section 360bbb-3(b)(1), unless the authorization is terminated  or revoked sooner.       Influenza A by PCR NEGATIVE NEGATIVE Final   Influenza B by PCR NEGATIVE NEGATIVE Final    Comment: (NOTE) The Xpert Xpress SARS-CoV-2/FLU/RSV plus assay is intended as an aid in the diagnosis of  influenza from Nasopharyngeal swab specimens and should not be used as a sole basis for treatment. Nasal washings and aspirates are unacceptable for Xpert Xpress SARS-CoV-2/FLU/RSV testing.  Fact Sheet for Patients: EntrepreneurPulse.com.au  Fact Sheet for Healthcare Providers: IncredibleEmployment.be  This test is not yet approved or cleared by the [place] FDA and has been authorized for detection and/or diagnosis of SARS-CoV-2 by FDA under an Emergency Use Authorization (EUA). This EUA will remain in effect (meaning this test can be used) for the duration of the COVID-19 declaration under Section 564(b)(1) of the Act, 21 U.S.C. section 360bbb-3(b)(1), unless the authorization is terminated or revoked.     Resp Syncytial Virus by PCR NEGATIVE NEGATIVE Final    Comment: (NOTE) Fact Sheet for Patients: EntrepreneurPulse.com.au  Fact Sheet for Healthcare Providers: IncredibleEmployment.be  This test is not yet approved or cleared by the [place] FDA and has been authorized for detection and/or diagnosis of SARS-CoV-2 by FDA under an Emergency Use Authorization (EUA). This EUA will remain in effect (meaning this test can be used) for the duration of the COVID-19 declaration under Section 564(b)(1) of the Act, 21 U.S.C. section 360bbb-3(b)(1), unless the authorization is terminated or revoked.  Performed at [facility], [place] [address], [place], [place] [address]      Time coordinating discharge: 35 minutes  SIGNED:   Rodena Goldmann, DO [facility] 03/07/2022, 2:57 PM  If 7PM-7AM, please contact night-coverage [email or web address]

## 2022-03-07 NOTE — Plan of Care (Signed)
  Problem: Education: Goal: Ability to describe self-care measures that may prevent or decrease complications (Diabetes Survival Skills Education) will improve Outcome: Adequate for Discharge Goal: Individualized Educational Video(s) Outcome: Adequate for Discharge   Problem: Coping: Goal: Ability to adjust to condition or change in health will improve Outcome: Adequate for Discharge   Problem: Fluid Volume: Goal: Ability to maintain a balanced intake and output will improve Outcome: Adequate for Discharge   Problem: Health Behavior/Discharge Planning: Goal: Ability to identify and utilize available resources and services will improve Outcome: Adequate for Discharge Goal: Ability to manage health-related needs will improve Outcome: Adequate for Discharge   Problem: Metabolic: Goal: Ability to maintain appropriate glucose levels will improve Outcome: Adequate for Discharge   Problem: Nutritional: Goal: Maintenance of adequate nutrition will improve Outcome: Adequate for Discharge Goal: Progress toward achieving an optimal weight will improve Outcome: Adequate for Discharge   Problem: Skin Integrity: Goal: Risk for impaired skin integrity will decrease Outcome: Adequate for Discharge   Problem: Tissue Perfusion: Goal: Adequacy of tissue perfusion will improve Outcome: Adequate for Discharge   Problem: Health Behavior/Discharge Planning: Goal: Ability to manage health-related needs will improve Outcome: Adequate for Discharge   Problem: Clinical Measurements: Goal: Ability to maintain clinical measurements within normal limits will improve Outcome: Adequate for Discharge Goal: Will remain free from infection Outcome: Adequate for Discharge Goal: Diagnostic test results will improve Outcome: Adequate for Discharge Goal: Respiratory complications will improve Outcome: Adequate for Discharge Goal: Cardiovascular complication will be avoided Outcome: Adequate for  Discharge   Problem: Activity: Goal: Risk for activity intolerance will decrease Outcome: Adequate for Discharge   Problem: Nutrition: Goal: Adequate nutrition will be maintained Outcome: Adequate for Discharge   Problem: Coping: Goal: Level of anxiety will decrease Outcome: Adequate for Discharge   Problem: Elimination: Goal: Will not experience complications related to bowel motility Outcome: Adequate for Discharge Goal: Will not experience complications related to urinary retention Outcome: Adequate for Discharge   Problem: Pain Managment: Goal: General experience of comfort will improve Outcome: Adequate for Discharge   Problem: Safety: Goal: Ability to remain free from injury will improve Outcome: Adequate for Discharge   Problem: Skin Integrity: Goal: Risk for impaired skin integrity will decrease Outcome: Adequate for Discharge   

## 2022-03-08 ENCOUNTER — Telehealth (INDEPENDENT_AMBULATORY_CARE_PROVIDER_SITE_OTHER): Payer: Self-pay

## 2022-03-08 NOTE — Telephone Encounter (Signed)
Transition Care Management Unsuccessful Follow-up Telephone Call  Date of discharge and from where:  [facility] 03/07/2022  Attempts:  1st Attempt  Reason for unsuccessful TCM follow-up call:  Unable to leave message Juanda Crumble, [place] Direct Dial [phone]

## 2022-03-09 NOTE — Telephone Encounter (Signed)
Transition Care Management Follow-up Telephone Call Date of discharge and from where: [facility]22 How have you been since you were released from the hospital? better Any questions or concerns? No  Items Reviewed: Did the pt receive and understand the discharge instructions provided? Yes  Medications obtained and verified? Yes  Other? No  Any new allergies since your discharge? No  Dietary orders reviewed? Yes Do you have support at home? Yes   Home Care and Equipment/Supplies: Were home health services ordered? no If so, what is the name of the agency? N/a  Has the agency set up a time to come to the patient's home? not applicable Were any new equipment or medical supplies ordered?  No What is the name of the medical supply agency? N/a Were you able to get the supplies/equipment? not applicable Do you have any questions related to the use of the equipment or supplies? No  Functional Questionnaire: (I = Independent and D = Dependent) ADLs: I  Bathing/Dressing- I  Meal Prep- I  Eating- I  Maintaining continence- I  Transferring/Ambulation- I  Managing Meds- I  Follow up appointments reviewed:  PCP Hospital f/u appt confirmed? No  no avail appt times , sent message to staff to schedule Specialist Hospital f/u appt confirmed? No  patient will schedule with urology Are transportation arrangements needed? No  If their condition worsens, is the pt aware to call PCP or go to the Emergency Dept.? Yes Was the patient provided with contact information for the PCP's office or ED? Yes Was to pt encouraged to call back with questions or concerns? Yes Juanda Crumble, LPN [place] [facility] [phone]

## 2022-03-23 ENCOUNTER — Ambulatory Visit: Payer: Self-pay | Attending: Family Medicine | Admitting: Pharmacist

## 2022-03-23 ENCOUNTER — Other Ambulatory Visit: Payer: Self-pay

## 2022-03-23 ENCOUNTER — Encounter: Payer: Self-pay | Admitting: Pharmacist

## 2022-03-23 DIAGNOSIS — Z794 Long term (current) use of insulin: Secondary | ICD-10-CM

## 2022-03-23 DIAGNOSIS — E1165 Type 2 diabetes mellitus with hyperglycemia: Secondary | ICD-10-CM

## 2022-03-23 MED ORDER — ATORVASTATIN CALCIUM 40 MG PO TABS
40.0000 mg | ORAL_TABLET | Freq: Every day | ORAL | 1 refills | Status: DC
  Filled 2022-03-23: qty 90, 90d supply, fill #0
  Filled 2022-07-16: qty 90, 90d supply, fill #1

## 2022-03-23 NOTE — Progress Notes (Signed)
S:    No chief complaint on file.  62 y.o. male who presents for diabetes evaluation, education, and management. PMH is significant for T2DM, HTN, HLD, GERD, and urinary incontinence. Patient was referred and last seen by Primary Care Provider, Juluis Mire, NP, on 01/04/2022. Last seen by pharmacy clinic 02/12/22.   At last visit with PCP, A1c was elevated at 13% and it was noted that he had not picked up his medications since July 2023.  When we saw him last month, we encouraged medication adherence and condensed his insulin. Today, patient arrives in good spirits and presents with the assistance of an online interpretor. He has no complaints today. Of note, was hospitalized 02/28/2022-03/07/2022 with hematuria, foley catheter obstruction, found to have UTI. Treated with cefdinir. He tells me his sugars were elevated in the hospital, likely secondary to his infection. He has since completed antibiotic therapy. He notes that he feels much better and that his blood sugar levels have lowered back down to normal. .   Family/Social History:  -Current smoker -Mother, sister, and brother have diabetes  Current diabetes medications include: metformin 1000 mg BID, Basaglar 24 units BID, Trulicity 3 mg weekly (Sunday's) Current hypertension medications include: lisinopril 5 mg daily Current hyperlipidemia medications include: gemfibrozil 600 mg BID  Patient reports adherence to taking all medications as prescribed.   Insurance coverage: uninsured  Patient denies hypoglycemic events.   Reported home fasting blood sugars: 100s-110s over the past week to two weeks.  Reported 2 hour post-meal/random blood sugars: 140s over the past week to two weeks.   Patient denies nocturia (nighttime urination).  Patient reports neuropathy but no changed from baseline (nerve pain)- left foot Patient reports visual changes but no change from baseline.  Patient reports self foot exams.   O:  7 day average  blood glucose: did not bring GM to visit. No CGM in place.   Lab Results  Component Value Date   HGBA1C 13.0 (A) 01/04/2022   There were no vitals filed for this visit.  Lipid Panel     Component Value Date/Time   CHOL 159 09/12/2021 0923   TRIG 409 (H) 09/12/2021 0923   HDL 30 (L) 09/12/2021 0923   CHOLHDL 5.3 (H) 09/12/2021 0923   CHOLHDL 4.7 03/07/2016 1046   VLDL 61 (H) 03/07/2016 1046   LDLCALC 65 09/12/2021 0923    Clinical Atherosclerotic Cardiovascular Disease (ASCVD): No  The 10-year ASCVD risk score (Arnett DK, et al., 2019) is: 28.7%   Values used to calculate the score:     Age: 89 years     Sex: Male     Is Non-Hispanic African American: No     Diabetic: Yes     Tobacco smoker: Yes     Systolic Blood Pressure: 144 mmHg     Is BP treated: Yes     HDL Cholesterol: 30 mg/dL     Total Cholesterol: 159 mg/dL   A/P: Diabetes longstanding currently uncontrolled based on A1c. Patient is able to verbalize appropriate hypoglycemia management plan. Medication adherence appears appropriate. -Continue Basaglar (insulin glargine) 48 units ONCE daily. -Continued GLP-1 Trulicity 3 mg weekly. Patient receives through [facility] and has the medication shipped to his home. -Defer initiation of  SGLT2-I given recent UTI and urinary incontinence.  -Continued metformin 1000 mg BID.  -Extensively discussed pathophysiology of diabetes, recommended lifestyle interventions, dietary effects on blood sugar control.  -Counseled on s/sx of and management of hypoglycemia.  -Next A1c anticipated February  2024.   ASCVD risk - primary prevention in patient with diabetes. Last LDL is at goal of <70 mg/dL (LDL=65 on 09/12/2021). ASCVD risk factors include T2DM, HLD, current smoker and 10-year ASCVD risk score of 35.4%. High intensity statin indicated. Patient previously on atorvastatin 40 mg daily but was discontinued by PCP on 09/12/2021. No statin intolerance documented.  -Discontinue gemfibrozil 600  mg BID -Start atorvastatin 40 mg daily  -Lipid  Written patient instructions provided. Patient verbalized understanding of treatment plan.  Total time in face to face counseling 25 minutes.    Follow-up:  Pharmacist prn. PCP clinic visit in February 2024.  Benard Halsted, PharmD, [facility], [place] [phone]

## 2022-03-24 LAB — LIPID PANEL
Chol/HDL Ratio: 4.3 ratio (ref 0.0–5.0)
Cholesterol, Total: 178 mg/dL (ref 100–199)
HDL: 41 mg/dL (ref >39)
LDL Chol Calc (NIH): 114 mg/dL — ABNORMAL HIGH (ref 0–99)
Triglycerides: 130 mg/dL (ref 0–149)
VLDL Cholesterol Cal: 23 mg/dL (ref 5–40)

## 2022-03-27 ENCOUNTER — Other Ambulatory Visit: Payer: Self-pay

## 2022-03-30 ENCOUNTER — Other Ambulatory Visit (HOSPITAL_COMMUNITY): Payer: Self-pay

## 2022-03-30 ENCOUNTER — Other Ambulatory Visit: Payer: Self-pay

## 2022-04-03 ENCOUNTER — Other Ambulatory Visit: Payer: Self-pay

## 2022-04-03 ENCOUNTER — Other Ambulatory Visit (HOSPITAL_COMMUNITY): Payer: Self-pay

## 2022-04-04 ENCOUNTER — Other Ambulatory Visit: Payer: Self-pay

## 2022-04-11 ENCOUNTER — Ambulatory Visit (INDEPENDENT_AMBULATORY_CARE_PROVIDER_SITE_OTHER): Payer: Self-pay | Admitting: Primary Care

## 2022-04-11 ENCOUNTER — Encounter (INDEPENDENT_AMBULATORY_CARE_PROVIDER_SITE_OTHER): Payer: Self-pay | Admitting: Primary Care

## 2022-04-11 VITALS — BP 133/85 | HR 62 | Resp 16 | Ht 63.0 in | Wt 158.4 lb

## 2022-04-11 DIAGNOSIS — E1165 Type 2 diabetes mellitus with hyperglycemia: Secondary | ICD-10-CM

## 2022-04-11 DIAGNOSIS — Z794 Long term (current) use of insulin: Secondary | ICD-10-CM

## 2022-04-11 DIAGNOSIS — E1169 Type 2 diabetes mellitus with other specified complication: Secondary | ICD-10-CM

## 2022-04-11 DIAGNOSIS — E782 Mixed hyperlipidemia: Secondary | ICD-10-CM

## 2022-04-11 DIAGNOSIS — Z09 Encounter for follow-up examination after completed treatment for conditions other than malignant neoplasm: Secondary | ICD-10-CM

## 2022-04-11 DIAGNOSIS — E663 Overweight: Secondary | ICD-10-CM

## 2022-04-11 DIAGNOSIS — E119 Type 2 diabetes mellitus without complications: Secondary | ICD-10-CM

## 2022-04-11 LAB — POCT GLYCOSYLATED HEMOGLOBIN (HGB A1C): HbA1c, POC (controlled diabetic range): 8.2 % — AB (ref 0.0–7.0)

## 2022-04-11 NOTE — Progress Notes (Signed)
[facility]   Subjective:   Joseph Roberts is a 62 y.o. Hispanic male ( interpreter Judeth Porch [phone] for hospital follow up. On 02/28/22, patient presented to the ED  with gross hematuria and suprapubic pain. He reports that he started noticing a decrease in urine output from his chronic foley about a week ago and blood clots with gross blood over a week.  He was discharged from the hospital on 03/07/22, patient was admitted for:  Gross hematuria, GERD, Type 2 diabetes mellitus, acute kidney injury,  Dyslipidemia, benign prostatic hyperplasia,   Obstruction of Foley catheter ,urinary tract infection, Hyponatremia and Normocytic anemia. Today he has had f/c removed prior to d/c denies hematuria and no pain with BPH. Patient has No headache, No chest pain, No abdominal pain - No Nausea, No new weakness tingling or numbness, No Cough - shortness of breath. Denies polyuria, polydipsia, polyphagia or vision changes. He voices concerns that his right ear fill like it is pounding.   Past Medical History:  Diagnosis Date   Diabetes mellitus 2011   High triglycerides    Hypertension      No Known Allergies  Current Outpatient Medications on File Prior to Visit  Medication Sig Dispense Refill   atorvastatin (LIPITOR) 40 MG tablet Take 1 tablet (40 mg total) by mouth daily. 90 tablet 1   Blood Glucose Monitoring Suppl (ONE TOUCH ULTRA MINI) W/DEVICE KIT 1 each by Does not apply route daily. Test fasting daily     Blood Glucose Monitoring Suppl (TRUE METRIX METER) w/Device KIT Use as instructed. Check blood glucose level by fingerstick three times per day. 1 kit 0   Dulaglutide (TRULICITY) 3 EY/8.1KG SOPN Inject 3 mg as directed once a week. (Patient taking differently: Inject 3 mg as directed every Friday.) 2 mL 3   glucose blood (TRUE METRIX BLOOD GLUCOSE TEST) test strip Use as instructed. Check blood glucose level by fingerstick three times per day. 100 each 12   Insulin  Glargine (BASAGLAR KWIKPEN) 100 UNIT/ML Inject 48 Units into the skin daily. (Patient taking differently: Inject 44 Units into the skin at bedtime.) 15 mL 2   Insulin Pen Needle 31G X 5 MM MISC use as directed to inject insulin 4 times daily 200 each 3   Insulin Syringe-Needle U-100 (TRUEPLUS INSULIN SYRINGE) 31G X 5/16" 0.3 ML MISC Use to inject Humalog TID. 100 each 2   lisinopril (ZESTRIL) 5 MG tablet Take 1 tablet (5 mg total) by mouth daily. 90 tablet 1   metFORMIN (GLUCOPHAGE-XR) 500 MG 24 hr tablet Take 2 tablets (1,000 mg total) by mouth 2 (two) times daily. 120 tablet 2   tamsulosin (FLOMAX) 0.4 MG CAPS capsule Take 1 capsule (0.4 mg total) by mouth daily. 90 capsule 3   traMADol (ULTRAM) 50 MG tablet take 1-2 tablets by mouth every 6 hours as needed (Patient taking differently: Take 50-100 mg by mouth every 6 (six) hours as needed (for pain).) 10 tablet 0   TRUEplus Lancets 28G MISC Use as instructed. Check blood glucose level by fingerstick three times per day. 100 each 3   No current facility-administered medications on file prior to visit.     Review of System: Comprehensive ROS Pertinent positive and negative noted in HPI    Objective:  Blood Pressure 133/85   Pulse 62   Respiration 16   Height 5\' 3"  (1.6 m)   Weight 158 lb 6.4 oz (71.8 kg)   Oxygen Saturation 99%   Body Mass  Index 28.06 kg/m   Filed Weights   04/11/22 0845  Weight: 158 lb 6.4 oz (71.8 kg)    Physical Exam: General Appearance: Well nourished, in no apparent distress. Eyes: PERRLA, EOMs, conjunctiva no swelling or erythema Sinuses: No Frontal/maxillary tenderness ENT/Mouth: Ext aud canals clear, TMs without erythema, bulging Hearing normal.  Neck: Supple, thyroid normal.  Respiratory: Respiratory effort normal, BS equal bilaterally without rales, rhonchi, wheezing or stridor.  Cardio: RRR with no MRGs. Brisk peripheral pulses without edema.  Abdomen: Soft, + BS.  Non tender, no guarding, rebound,  hernias, masses. Lymphatics: Non tender without lymphadenopathy.  Musculoskeletal: Full ROM, 5/5 strength, normal gait.  Skin: Warm, dry without rashes, lesions, ecchymosis.  Neuro: Cranial nerves intact. Normal muscle tone, no cerebellar symptoms. Sensation intact.  Psych: Awake and oriented X 3, normal affect, Insight and Judgment appropriate.    Assessment:  Jayvan was seen today for hospitalization follow-up.  Diagnoses and all orders for this visit:  Type 2 diabetes mellitus with hyperglycemia, with long-term current use of insulin (HCC) -     POCT glycosylated hemoglobin (Hb A1C) 8.2 previously 13.0 very please  - educated on lifestyle modifications, including but not limited to diet choices and adding exercise to daily routine.    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 Foods high in cholesterol or liver, fatty meats,cheese, butter avocados, nuts and seeds, chocolate and fried foods.   Over weight  Discussed diet and exercise for person with BMI >25. Instructed: You must burn more calories than you eat. Losing 5 percent of your body weight should be considered a success. In the longer term, losing more than 15 percent of your body weight and staying at this weight is an extremely good result. However, keep in mind that even losing 5 percent of your body weight leads to important health benefits, so try not to get discouraged if you're not able to lose more than this. Will recheck weight in 3-6 months.  This note has been created with Surveyor, quantity. Any  transcriptional errors are unintentional.   Kerin Perna, NP 04/11/2022, 8:52 AM

## 2022-04-11 NOTE — Patient Instructions (Signed)
Recuento de caloras para bajar de peso Calorie Counting for Weight Loss Las caloras son unidades de energa. El cuerpo necesita una cierta cantidad de caloras de los alimentos para que lo ayuden a funcionar durante todo el da. Cuando se comen o beben ms caloras de las que el cuerpo necesita, este acumula las caloras adicionales mayormente como grasa. Cuando se comen o beben menos caloras de las que el cuerpo necesita, este quema grasa para obtener la energa que necesita. El recuento de caloras es el registro de la cantidad de caloras que se comen y beben cada da. El recuento de caloras puede ser de ayuda si necesita perder peso. Si come menos caloras de las que el cuerpo necesita, debera bajar de peso. Pregntele al mdico cul es un peso sano para usted. Para que el recuento de caloras funcione, usted tendr que ingerir la cantidad de caloras adecuadas cada da, para bajar una cantidad de peso saludable por semana. Un nutricionista puede ayudar a determinar la cantidad de caloras que usted necesita por da y sugerirle formas de alcanzar su objetivo calrico. Una cantidad de peso saludable para bajar cada semana suele ser entre 1 y 2 libras (0.5 a 0.9 kg). Esto habitualmente significa que su ingesta diaria de caloras se debera reducir en unas 500 a 750 caloras. Ingerir de 1200 a 1500 caloras por da puede ayudar a la mayora de las mujeres a bajar de peso. Ingerir de 1500 a 1800 caloras por da puede ayudar a la mayora de los hombres a bajar de peso. Qu debo saber acerca del recuento de caloras? Trabaje con el mdico o el nutricionista para determinar cuntas caloras debe recibir cada da. A fin de alcanzar su objetivo diario de caloras, tendr que: Averiguar cuntas caloras hay en cada alimento que le gustara comer. Intente hacerlo antes de comer. Decidir la cantidad que puede comer del alimento. Llevar un registro de los alimentos. Para esto, anote lo que comi y cuntas  caloras tena. Para perder peso con xito, es importante equilibrar el recuento de caloras con un estilo de vida saludable que incluya actividad fsica de forma regular. Dnde encuentro informacin sobre las caloras?  Es posible encontrar la cantidad de caloras que contiene un alimento en la etiqueta de informacin nutricional. Si un alimento no tiene una etiqueta de informacin nutricional, intente buscar las caloras en Internet o pida ayuda al nutricionista. Recuerde que las caloras se calculan por porcin. Si opta por comer ms de una porcin de un alimento, tendr que multiplicar las caloras de una porcin por la cantidad de porciones que planea comer. Por ejemplo, la etiqueta de un envase de pan puede decir que el tamao de una porcin es 1 rodaja, y que una porcin tiene 90 caloras. Si come 1 rodaja, habr comido 90 caloras. Si come 2 rodajas, habr comido 180 caloras. Cmo llevo un registro de comidas? Despus de cada vez que coma, anote lo siguiente en el registro de alimentos lo antes posible: Lo que comi. Asegrese de incluir los aderezos, las salsas y otros extras en los alimentos. La cantidad que comi. Esto se puede medir en tazas, onzas o cantidad de alimentos. Cuntas caloras haba en cada alimento y en cada bebida. La cantidad total de caloras en la comida que tom. Tenga a mano el registro de alimentos, por ejemplo, en un anotador de bolsillo o utilice una aplicacin o sitio web en el telfono mvil. Algunos programas calcularn las caloras por usted y le mostrarn la cantidad de   caloras que le quedan para llegar al objetivo diario. Cules son algunos consejos para controlar las porciones? Sepa cuntas caloras hay en una porcin. Esto lo ayudar a saber cuntas porciones de un alimento determinado puede comer. Use una taza medidora para medir los tamaos de las porciones. Tambin puede intentar pesar las porciones en una balanza de cocina. Con el tiempo, podr hacer  un clculo estimativo de los tamaos de las porciones de algunos alimentos. Dedique tiempo a poner porciones de diferentes alimentos en sus platos, tazones y tazas predilectos, a fin de saber cmo se ve una porcin. Intente no comer directamente de un envase de alimentos, por ejemplo, de una bolsa o una caja. Comer directamente del envase dificulta ver cunto est comiendo y puede conducir a comer en exceso. Ponga la cantidad que le gustara comer en una taza o un plato, a fin de asegurarse de que est comiendo la porcin correcta. Use platos, vasos y tazones ms pequeos para medir porciones ms pequeas y evitar no comer en exceso. Intente no realizar varias tareas al mismo tiempo. Por ejemplo, evite mirar televisin o usar la computadora mientras come. Si es la hora de comer, sintese a la mesa y disfrute de la comida. Esto lo ayudar a reconocer cundo est satisfecho. Tambin le permitir estar ms consciente de qu come y cunto come. Consejos para seguir este plan Al leer las etiquetas de los alimentos Controle el recuento de caloras en comparacin con el tamao de la porcin. El tamao de la porcin puede ser ms pequeo de lo que suele comer. Verifique la fuente de las caloras. Intente elegir alimentos ricos en protenas, fibras y vitaminas, y bajos en grasas saturadas, grasas trans y sodio. Al ir de compras Lea las etiquetas nutricionales cuando compre. Esto lo ayudar a tomar decisiones saludables sobre qu alimentos comprar. Preste atencin a las etiquetas nutricionales de alimentos bajos en grasas o sin grasas. Estos alimentos a veces tienen la misma cantidad de caloras o ms caloras que las versiones ricas en grasas. Con frecuencia, tambin tienen agregados de azcar, almidn o sal, para darles el sabor que fue eliminado con las grasas. Haga una lista de compras con los alimentos que tienen un menor contenido de caloras y resptela. Al cocinar Intente cocinar sus alimentos preferidos  de una manera ms saludable. Por ejemplo, pruebe hornear en vez de frer. Utilice productos lcteos descremados. Planificacin de las comidas Utilice ms frutas y verduras. La mitad de su plato debe ser de frutas y verduras. Incluya protenas magras, como pollo, pavo y pescado. Estilo de vida Cada semana, trate de hacer una de las siguientes cosas: 150 minutos de ejercicio moderado, como caminar. 75 minutos de ejercicio enrgico, como correr. Informacin general Sepa cuntas caloras tienen los alimentos que come con ms frecuencia. Esto le ayudar a contar las caloras ms rpidamente. Encuentre un mtodo para controlar las caloras que funcione para usted. Sea creativo. Pruebe aplicaciones o programas distintos, si llevar un registro de las caloras no funciona para usted. Qu alimentos debo consumir?  Consuma alimentos nutritivos. Es mejor comer un alimento nutritivo, de alto contenido calrico, como un aguacate, que uno con pocos nutrientes, como una bolsa de patatas fritas. Use sus caloras en alimentos y bebidas que lo sacien y no lo dejen con apetito apenas termina de comer. Ejemplos de alimentos que lo sacian son los frutos secos y mantequillas de frutos secos, verduras, protenas magras y alimentos con alto contenido de fibra como los cereales integrales. Los alimentos con alto   contenido de fibra son aquellos que tienen ms de 5 g de fibra por porcin. Preste atencin a las caloras en las bebidas. Las bebidas de bajas caloras incluyen agua y refrescos sin azcar. Es posible que los productos que se enumeran ms arriba no constituyan una lista completa de los alimentos y las bebidas que puede tomar. Consulte a un nutricionista para obtener ms informacin. Qu alimentos debo limitar? Limite el consumo de alimentos o bebidas que no sean buenas fuentes de vitaminas, minerales o protenas, o que tengan alto contenido de grasas no saludables. Estos incluyen: Caramelos. Otros  dulces. Refrescos, bebidas con caf especiales, alcohol y jugo. Es posible que los productos que se enumeran ms arriba no constituyan una lista completa de los alimentos y las bebidas que debe evitar. Consulte a un nutricionista para obtener ms informacin. Cmo puedo hacer el recuento de caloras cuando como afuera? Preste atencin a las porciones. A menudo, las porciones son mucho ms grandes al comer afuera. Pruebe con estos consejos para mantener las porciones ms pequeas: Considere la posibilidad de compartir una comida en lugar de tomarla toda usted solo. Si pide su propia comida, coma solo la mitad. Antes de empezar a comer, pida un recipiente y ponga la mitad de la comida en l. Cuando sea posible, considere la posibilidad de pedir porciones ms pequeas del men en lugar de porciones completas. Preste atencin a la eleccin de alimentos y bebidas. Saber la forma en que se cocinan los alimentos y lo que incluye la comida puede ayudarlo a ingerir menos caloras. Si se detallan las caloras en el men, elija las opciones que contengan la menor cantidad. Elija platos que incluyan verduras, frutas, cereales integrales, productos lcteos con bajo contenido de grasa y protenas magras. Opte por los alimentos hervidos, asados, cocidos a la parrilla o al vapor. Evite los alimentos a los que se les ponga mantequilla, que estn empanados o fritos, o que se sirvan con salsa a base de crema. Generalmente, los alimentos que se etiquetan como "crujientes" estn fritos, a menos que se indique lo contrario. Elija el agua, la leche descremada, el t helado sin azcar u otras bebidas que no contengan azcares agregados. Si desea una bebida alcohlica, escoja una opcin con menos caloras, como una copa de vino o una cerveza ligera. Ordene los aderezos, las salsas y los jarabes aparte. Estos son, con frecuencia, de alto contenido en caloras, por lo que debe limitar la cantidad que ingiere. Si desea una  ensalada, elija una de hortalizas y pida carnes a la parrilla. Evite las guarniciones adicionales como el tocino, el queso o los alimentos fritos. Ordene el aderezo aparte o pida aceite de oliva y vinagre o limn para aderezar. Haga un clculo estimativo de la cantidad de porciones que le sirven. Conocer el tamao de las porciones lo ayudar a estar atento a la cantidad de comida que come en los restaurantes. Dnde buscar ms informacin Centers for Disease Control and Prevention (Centros para el Control y la Prevencin de Enfermedades): www.cdc.gov U.[place]. Department of Agriculture (Departamento de Agricultura de los [place]): myplate.gov Resumen El recuento de caloras es el registro de la cantidad de caloras que se comen y beben cada da. Si come menos caloras de las que el cuerpo necesita, debera bajar de peso. Una cantidad de peso saludable para bajar por semana suele ser entre 1 y 2 libras (0.5 a 0.9 kg). Esto significa, con frecuencia, reducir su ingesta diaria de caloras unas 500 a 750 caloras. Es posible   encontrar la cantidad de caloras que contiene un alimento en la etiqueta de informacin nutricional. Si un alimento no tiene una etiqueta de informacin nutricional, intente buscar las caloras en Internet o pida ayuda al nutricionista. Use platos, vasos y tazones ms pequeos para medir porciones ms pequeas y evitar no comer en exceso. Use sus caloras en alimentos y bebidas que lo sacien y no lo dejen con apetito poco tiempo despus de haber comido. Esta informacin no tiene como fin reemplazar el consejo del mdico. Asegrese de hacerle al mdico cualquier pregunta que tenga. Document Revised: 06/15/2019 Document Reviewed: 06/15/2019 Elsevier Patient Education  2023 [employer].  

## 2022-04-14 LAB — MICROALBUMIN / CREATININE URINE RATIO
Creatinine, Urine: 142.1 mg/dL
Microalb/Creat Ratio: 1252 mg/g creat — ABNORMAL HIGH (ref 0–29)
Microalbumin, Urine: 1779.5 ug/mL

## 2022-04-17 ENCOUNTER — Other Ambulatory Visit (INDEPENDENT_AMBULATORY_CARE_PROVIDER_SITE_OTHER): Payer: Self-pay | Admitting: Primary Care

## 2022-04-17 DIAGNOSIS — R809 Proteinuria, unspecified: Secondary | ICD-10-CM

## 2022-04-25 ENCOUNTER — Other Ambulatory Visit: Payer: Self-pay

## 2022-04-25 ENCOUNTER — Other Ambulatory Visit (HOSPITAL_COMMUNITY): Payer: Self-pay

## 2022-04-25 ENCOUNTER — Other Ambulatory Visit (INDEPENDENT_AMBULATORY_CARE_PROVIDER_SITE_OTHER): Payer: Self-pay | Admitting: Primary Care

## 2022-04-25 NOTE — Telephone Encounter (Signed)
Unable to refill per protocol, Rx expired. Discontinued 03/07/22, stop taking at discharge.  Requested Prescriptions  Pending Prescriptions Disp Refills   oxybutynin (DITROPAN-XL) 5 MG 24 hr tablet 30 tablet 11    Sig: Take 1 tablet (5 mg total) by mouth daily.     Urology:  Bladder Agents Passed - 04/25/2022 10:23 AM      Passed - Valid encounter within last 12 months    Recent Outpatient Visits           2 weeks ago Type 2 diabetes mellitus with hyperglycemia, with long-term current use of insulin ([place])   [place] Kerin Perna, NP   1 month ago Type 2 diabetes mellitus with hyperglycemia, with long-term current use of insulin [facility])   [place], B[place] L, RPH-CPP   2 months ago Type 2 diabetes mellitus with hyperglycemia, with long-term current use of insulin [facility])   [place], [place] L, RPH-CPP   3 months ago Type 2 diabetes mellitus with hyperglycemia, with long-term current use of insulin ([employer])   [place], Michelle P, NP   7 months ago Type 2 diabetes mellitus with hyperglycemia, with long-term current use of insulin [facility])   [place] [facility] Kerin Perna, NP       Future Appointments             In 2 months Oletta Lamas, [place] Cage, NP [place]

## 2022-04-26 ENCOUNTER — Other Ambulatory Visit: Payer: Self-pay

## 2022-05-10 ENCOUNTER — Other Ambulatory Visit: Payer: Self-pay

## 2022-05-10 MED ORDER — FINASTERIDE 5 MG PO TABS
5.0000 mg | ORAL_TABLET | Freq: Every day | ORAL | 3 refills | Status: DC
  Filled 2022-05-10: qty 90, 90d supply, fill #0

## 2022-05-10 MED ORDER — TAMSULOSIN HCL 0.4 MG PO CAPS
0.4000 mg | ORAL_CAPSULE | Freq: Every day | ORAL | 3 refills | Status: DC
  Filled 2022-05-10: qty 90, 90d supply, fill #0

## 2022-05-11 ENCOUNTER — Other Ambulatory Visit: Payer: Self-pay

## 2022-06-01 ENCOUNTER — Other Ambulatory Visit: Payer: Self-pay

## 2022-06-01 MED ORDER — OXYBUTYNIN CHLORIDE 5 MG PO TABS
ORAL_TABLET | ORAL | 0 refills | Status: DC
  Filled 2022-06-01: qty 30, 15d supply, fill #0

## 2022-06-01 MED ORDER — CEPHALEXIN 500 MG PO CAPS
ORAL_CAPSULE | ORAL | 0 refills | Status: DC
  Filled 2022-06-01: qty 10, 5d supply, fill #0

## 2022-06-04 ENCOUNTER — Other Ambulatory Visit: Payer: Self-pay

## 2022-06-14 ENCOUNTER — Other Ambulatory Visit: Payer: Self-pay

## 2022-06-14 MED ORDER — OXYBUTYNIN CHLORIDE 5 MG PO TABS
5.0000 mg | ORAL_TABLET | Freq: Three times a day (TID) | ORAL | 2 refills | Status: DC | PRN
  Filled 2022-06-14: qty 30, 10d supply, fill #0
  Filled 2022-09-14: qty 30, 10d supply, fill #1
  Filled 2023-01-17: qty 30, 10d supply, fill #2

## 2022-06-20 ENCOUNTER — Other Ambulatory Visit: Payer: Self-pay | Admitting: Urology

## 2022-06-27 ENCOUNTER — Other Ambulatory Visit: Payer: Self-pay

## 2022-07-16 ENCOUNTER — Other Ambulatory Visit (INDEPENDENT_AMBULATORY_CARE_PROVIDER_SITE_OTHER): Payer: Self-pay | Admitting: Primary Care

## 2022-07-16 ENCOUNTER — Encounter (INDEPENDENT_AMBULATORY_CARE_PROVIDER_SITE_OTHER): Payer: Self-pay | Admitting: Primary Care

## 2022-07-16 ENCOUNTER — Ambulatory Visit (INDEPENDENT_AMBULATORY_CARE_PROVIDER_SITE_OTHER): Payer: Self-pay | Admitting: Primary Care

## 2022-07-16 ENCOUNTER — Other Ambulatory Visit: Payer: Self-pay

## 2022-07-16 VITALS — BP 119/79 | HR 86 | Resp 16 | Wt 149.2 lb

## 2022-07-16 DIAGNOSIS — Z794 Long term (current) use of insulin: Secondary | ICD-10-CM

## 2022-07-16 DIAGNOSIS — E782 Mixed hyperlipidemia: Secondary | ICD-10-CM

## 2022-07-16 DIAGNOSIS — E1165 Type 2 diabetes mellitus with hyperglycemia: Secondary | ICD-10-CM

## 2022-07-16 DIAGNOSIS — Z760 Encounter for issue of repeat prescription: Secondary | ICD-10-CM

## 2022-07-16 DIAGNOSIS — I10 Essential (primary) hypertension: Secondary | ICD-10-CM

## 2022-07-16 DIAGNOSIS — E1169 Type 2 diabetes mellitus with other specified complication: Secondary | ICD-10-CM

## 2022-07-16 LAB — POCT GLYCOSYLATED HEMOGLOBIN (HGB A1C): HbA1c, POC (controlled diabetic range): 12.7 % — AB (ref 0.0–7.0)

## 2022-07-16 MED ORDER — TRUE METRIX BLOOD GLUCOSE TEST VI STRP
ORAL_STRIP | 12 refills | Status: DC
  Filled 2022-07-16: qty 100, 33d supply, fill #0
  Filled 2023-03-29: qty 100, 33d supply, fill #1
  Filled 2023-07-05: qty 100, 33d supply, fill #2

## 2022-07-16 MED ORDER — INSULIN PEN NEEDLE 31G X 5 MM MISC
3 refills | Status: AC
  Filled 2022-07-16: qty 100, 25d supply, fill #0

## 2022-07-16 MED ORDER — "INSULIN SYRINGE-NEEDLE U-100 31G X 5/16"" 0.3 ML MISC"
2 refills | Status: AC
  Filled 2022-07-16: qty 100, 33d supply, fill #0

## 2022-07-16 MED ORDER — LISINOPRIL 5 MG PO TABS
5.0000 mg | ORAL_TABLET | Freq: Every day | ORAL | 1 refills | Status: DC
Start: 2022-07-16 — End: 2023-11-19
  Filled 2022-07-16: qty 90, 90d supply, fill #0

## 2022-07-16 MED ORDER — TRUEPLUS LANCETS 28G MISC
3 refills | Status: AC
  Filled 2022-07-16: qty 100, 33d supply, fill #0

## 2022-07-16 NOTE — Progress Notes (Signed)
[facility]     Joseph Roberts is a 62 y.o. Hispanic overweight male Joseph Roberts [phone]) who presents for an follow up evaluation of Type 2 diabetes mellitus.  Current symptoms/problems include hyperglycemia, polydipsia, and polyphagia   and have been worsening. Symptoms have been present for 3 month.  Current diabetic medications include Trulicity, metformin XR 500 (2) bid , Basiglar The patient was initially diagnosed with Type 2 diabetes mellitus based on the following criteria:  PerADA guidelines .  Current monitoring regimen: home blood tests - daily Home blood sugar records: fasting range: 180 unable to un able to result Any episodes of hypoglycemia? No   Known diabetic complications: none Cardiovascular risk factors: advanced age (older than 44 for men, 15 for women) Eye exam current (within one year): no Weight trend: stable Prior visit with CDE: yes -  Current diet: in general, a "healthy" diet   Current exercise:  working Medication Compliance?  No  Is He on ACE inhibitor or angiotensin II receptor blocker?  Yes  lisinopril (generic)  The following portions of the patient's history were reviewed and updated as appropriate: allergies, current medications, past family history, past medical history, past social history, past surgical history, and problem list.  ROS  Comprehensive ROS Pertinent positive and negative noted in HPI   Objective:    Blood Pressure 119/79   Pulse 86   Respiration 16   Weight 149 lb 3.2 oz (67.7 kg)   Oxygen Saturation 99%   Body Mass Index 26.43 kg/m   Physical Exam  General: No apparent distress. Eyes: Extraocular eye movements intact, pupils equal and round. Neck: Supple, trachea midline. Thyroid: No enlargement, mobile without fixation, no tenderness. Cardiovascular: Regular rhythm and rate, no murmur, normal radial pulses. Respiratory: Normal respiratory effort, clear to auscultation. Gastrointestinal: Normal  pitch active bowel sounds, nontender abdomen without distention or appreciable hepatomegaly. Musculoskeletal: Normal muscle tone, no tenderness on palpation of tibia, no excessive thoracic kyphosis. Skin: Appropriate warmth, no visible rash. Mental status: Alert, conversant, speech clear, thought logical, appropriate mood and affect, no hallucinations or delusions evident. Hematologic/lymphatic: No cervical adenopathy, no visible ecchymoses.  Lab Review Glucose, Bld (mg/dL)  Date Value  52/84/1324 152 (H)  03/06/2022 147 (H)  03/05/2022 300 (H)   CO2 (mmol/L)  Date Value  03/07/2022 19 (L)  03/06/2022 21 (L)  03/05/2022 18 (L)   BUN (mg/dL)  Date Value  40/12/2723 14  03/06/2022 12  03/05/2022 13  08/25/2021 18  01/19/2021 13  11/17/2019 17   Creat (mg/dL)  Date Value  36/64/4034 0.78  07/11/2015 1.09  04/08/2015 0.79   Creatinine, Ser (mg/dL)  Date Value  74/25/9563 0.82  03/06/2022 0.91  03/05/2022 0.78      Assessment:    Diabetes Mellitus type II, under unsatisfactory control.   [name] was seen today for diabetes.  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 Foods high in cholesterol or liver, fatty meats,cheese, butter avocados, nuts and seeds, chocolate and fried foods.    -     Lipid panel  Essential hypertension Well controlled -     Comprehensive metabolic panel  Uncontrolled type 2 diabetes mellitus with hyperglycemia (HCC) -     HgB A1c  1.  Rx changes: none  2.  Education: Reviewed 'ABCs' of diabetes management (respective goals in parentheses):  A1C (<7), blood pressure (<130/80), and cholesterol (LDL <100). Counseled Patient on the risk factors of co- morbidities with uncontrol diabetes  Complications -diabetic  retinopathy, (close your eyes ? What do you see nothing) nephropathy decrease in kidney function- can lead to dialysis-on a machine 3 days a week to filter your kidney, neuropathy- numbness and tinging in your hands and feet,  increase risk of heart attack and stroke, and amputation due to decrease wound healing and circulation. Decrease your risk by taking medication daily as prescribed, monitor carbohydrates- foods that are high in carbohydrates are the following rice, potatoes, breads, sugars, and pastas.  Reduction in the intake (eating) will assist in lowering your blood sugars. Exercise daily at least 30 minutes daily.   3. CHO counting diet discussed.  Reviewed CHO amount in various foods and how to read nutrition labels.  Discussed recommended serving sizes.   4.  Recommend check BG 3  times a day  5. Recommended increase physical activity - goal is 150 minutes per week  Continue Basaglar (insulin glargine) 48 units ONCE daily. -Start Humalog 6u before largest meal.  -Continued GLP-1 Trulicity 3 mg weekly. Patient receives through [facility] and has the medication shipped to his home. -Continued metformin 1000 mg BID.  Follow-up by clinical pharmacist last visit 07/19/2022 This note has been created with Dragon speech recognition software and Paediatric nurse. Any transcriptional errors are unintentional.   Grayce Sessions, NP 07/16/2022, 8:56 AM

## 2022-07-17 ENCOUNTER — Other Ambulatory Visit: Payer: Self-pay

## 2022-07-17 ENCOUNTER — Other Ambulatory Visit (INDEPENDENT_AMBULATORY_CARE_PROVIDER_SITE_OTHER): Payer: Self-pay | Admitting: Primary Care

## 2022-07-17 LAB — LIPID PANEL
Chol/HDL Ratio: 3 ratio (ref 0.0–5.0)
Cholesterol, Total: 108 mg/dL (ref 100–199)
HDL: 36 mg/dL — ABNORMAL LOW (ref >39)
LDL Chol Calc (NIH): 36 mg/dL (ref 0–99)
Triglycerides: 227 mg/dL — ABNORMAL HIGH (ref 0–149)
VLDL Cholesterol Cal: 36 mg/dL (ref 5–40)

## 2022-07-17 LAB — COMPREHENSIVE METABOLIC PANEL
ALT: 37 IU/L (ref 0–44)
AST: 21 IU/L (ref 0–40)
Albumin/Globulin Ratio: 1.4 (ref 1.2–2.2)
Albumin: 4.2 g/dL (ref 3.9–4.9)
Alkaline Phosphatase: 160 IU/L — ABNORMAL HIGH (ref 44–121)
BUN/Creatinine Ratio: 15 (ref 10–24)
BUN: 10 mg/dL (ref 8–27)
Bilirubin Total: 0.4 mg/dL (ref 0.0–1.2)
CO2: 17 mmol/L — ABNORMAL LOW (ref 20–29)
Calcium: 9.7 mg/dL (ref 8.6–10.2)
Chloride: 106 mmol/L (ref 96–106)
Creatinine, Ser: 0.67 mg/dL — ABNORMAL LOW (ref 0.76–1.27)
Globulin, Total: 3 g/dL (ref 1.5–4.5)
Glucose: 270 mg/dL — ABNORMAL HIGH (ref 70–99)
Potassium: 4.2 mmol/L (ref 3.5–5.2)
Sodium: 139 mmol/L (ref 134–144)
Total Protein: 7.2 g/dL (ref 6.0–8.5)
eGFR: 106 mL/min/{1.73_m2} (ref >59)

## 2022-07-17 MED ORDER — ATORVASTATIN CALCIUM 40 MG PO TABS
40.0000 mg | ORAL_TABLET | Freq: Every day | ORAL | 1 refills | Status: DC
  Filled 2022-07-17 – 2022-11-20 (×2): qty 90, 90d supply, fill #0
  Filled 2023-02-15: qty 90, 90d supply, fill #1

## 2022-07-18 ENCOUNTER — Other Ambulatory Visit: Payer: Self-pay

## 2022-07-19 ENCOUNTER — Other Ambulatory Visit: Payer: Self-pay

## 2022-07-19 ENCOUNTER — Ambulatory Visit: Payer: Self-pay | Attending: Primary Care | Admitting: Pharmacist

## 2022-07-19 ENCOUNTER — Encounter: Payer: Self-pay | Admitting: Pharmacist

## 2022-07-19 DIAGNOSIS — E1165 Type 2 diabetes mellitus with hyperglycemia: Secondary | ICD-10-CM

## 2022-07-19 DIAGNOSIS — Z794 Long term (current) use of insulin: Secondary | ICD-10-CM

## 2022-07-19 MED ORDER — INSULIN LISPRO (1 UNIT DIAL) 100 UNIT/ML (KWIKPEN)
6.0000 [IU] | PEN_INJECTOR | Freq: Every day | SUBCUTANEOUS | 1 refills | Status: DC
  Filled 2022-07-19: qty 3, 28d supply, fill #0
  Filled 2022-08-30: qty 3, 28d supply, fill #1

## 2022-07-19 NOTE — Progress Notes (Signed)
    S:    No chief complaint on file.  62 y.o. male who presents for diabetes evaluation, education, and management. PMH is significant for T2DM, HTN, HLD, GERD, and urinary incontinence. Patient was referred and last seen by Primary Care Provider, Gwinda Passe, NP, on 07/16/2022. Last seen by pharmacy clinic 03/23/2022.   At last visit with PCP, A1c was elevated at 12.7% (up from 8.2%). Today, he admits that this is due to dietary indiscretion. He denies non-adherence to his medication. No complaints today.    Family/Social History:  -Current smoker -Mother, sister, and brother have diabetes  Current diabetes medications include: metformin 1000 mg BID, Basaglar 48 units nightime, Trulicity 3 mg weekly (Sunday's) Current hypertension medications include: lisinopril 5 mg daily Current hyperlipidemia medications include: atorvastatin 40 mg daily  Patient reports adherence to taking all medications as prescribed.   Insurance coverage: none  Patient denies hypoglycemic events.   Reported home fasting blood sugars: 200s Reported 2 hour post-meal/random blood sugars: not checking  Patient denies nocturia (nighttime urination).  Patient reports neuropathy. Patient reports visual changes. Patient reports self foot exams.   Patient-reported dietary habits:  -Admits to increased carbs and tortillas -Denies drinking soda or fruit juices  Patient-reported exercise habits:  -Only walking 20 minutes a day  O:  7 day average blood glucose: did not bring GM to visit. No CGM in place.   Lab Results  Component Value Date   HGBA1C 12.7 (A) 07/16/2022   There were no vitals filed for this visit.  Lipid Panel     Component Value Date/Time   CHOL 108 07/16/2022 0925   TRIG 227 (H) 07/16/2022 0925   HDL 36 (L) 07/16/2022 0925   CHOLHDL 3.0 07/16/2022 0925   CHOLHDL 4.7 03/07/2016 1046   VLDL 61 (H) 03/07/2016 1046   LDLCALC 36 07/16/2022 0925    Clinical Atherosclerotic  Cardiovascular Disease (ASCVD): No  The ASCVD Risk score (Arnett DK, et al., 2019) failed to calculate for the following reasons:   The valid total cholesterol range is 130 to 320 mg/dL   A/P: Diabetes longstanding currently uncontrolled based on A1c. His sugars are still above goal and he is having some symptoms of hyperglycemia. Patient with no hypoglycemia and is able to verbalize appropriate hypoglycemia management plan. Medication adherence appears appropriate. -Continue Basaglar (insulin glargine) 48 units ONCE daily. -Start Humalog 6u before largest meal.  -Continued GLP-1 Trulicity 3 mg weekly. Patient receives through [facility] and has the medication shipped to his home. -Continued metformin 1000 mg BID.  -Extensively discussed pathophysiology of diabetes, recommended lifestyle interventions, dietary effects on blood sugar control.  -Counseled on s/sx of and management of hypoglycemia.  -Next A1c anticipated 10/2022.   Written patient instructions provided. Patient verbalized understanding of treatment plan.  Total time in face to face counseling 25 minutes.    Follow-up:  Pharmacist in 4-6 weeks. PCP clinic visit 10/16/22.  Butch Penny, PharmD, [place], CPP Clinical Pharmacist [facility] [phone]

## 2022-07-25 ENCOUNTER — Telehealth (INDEPENDENT_AMBULATORY_CARE_PROVIDER_SITE_OTHER): Payer: Self-pay | Admitting: Primary Care

## 2022-07-25 NOTE — Telephone Encounter (Signed)
Called and left VM with pt  

## 2022-07-27 NOTE — Patient Instructions (Addendum)
SURGICAL WAITING ROOM VISITATION Patients having surgery or a procedure may have no more than 2 support people in the waiting area - these visitors may rotate.    Children under the age of 68 must have an adult with them who is not the patient.  If the patient needs to stay at the hospital during part of their recovery, the visitor guidelines for inpatient rooms apply. Pre-op nurse will coordinate an appropriate time for 1 support person to accompany patient in pre-op.  This support person may not rotate.    Please refer to the [facility] website for the visitor guidelines for Inpatients (after your surgery is over and you are in a regular room).       Your procedure is scheduled on: 08-10-22   Report to [facility] Main Entrance    Report to admitting at 11:15 AM   Call this number if you have problems the morning of surgery [phone]   Do not eat food or drink liquids:After Midnight.           If you have questions, please contact your surgeon's office.   FOLLOW BOWEL PREP AND ANY ADDITIONAL PRE OP INSTRUCTIONS YOU RECEIVED FROM YOUR SURGEON'S OFFICE!!!     Oral Hygiene is also important to reduce your risk of infection.                                    Remember - BRUSH YOUR TEETH THE MORNING OF SURGERY WITH YOUR REGULAR TOOTHPASTE   Do NOT smoke after Midnight   Take these medicines the morning of surgery with A SIP OF WATER:   Atorvastatin  Finasteride  Oxybutynin  Tamsulosin  Tramadol if needed  How to Manage Your Diabetes Before and After Surgery  Why is it important to control my blood sugar before and after surgery? Improving blood sugar levels before and after surgery helps healing and can limit problems. A way of improving blood sugar control is eating a healthy diet by:  Eating less sugar and carbohydrates  Increasing activity/exercise  Talking with your doctor about reaching your blood sugar goals High blood sugars (greater than 180 mg/dL) can  raise your risk of infections and slow your recovery, so you will need to focus on controlling your diabetes during the weeks before surgery. Make sure that the doctor who takes care of your diabetes knows about your planned surgery including the date and location.  How do I manage my blood sugar before surgery? Check your blood sugar at least 4 times a day, starting 2 days before surgery, to make sure that the level is not too high or low. Check your blood sugar the morning of your surgery when you wake up and every 2 hours until you get to the Short Stay unit. If your blood sugar is less than 70 mg/dL, you will need to treat for low blood sugar: Do not take insulin. Treat a low blood sugar (less than 70 mg/dL) with  cup of clear juice (cranberry or apple), 4 glucose tablets, OR glucose gel. Recheck blood sugar in 15 minutes after treatment (to make sure it is greater than 70 mg/dL). If your blood sugar is not greater than 70 mg/dL on recheck, call [phone] for further instructions. Report your blood sugar to the short stay nurse when you get to Short Stay.  If you are admitted to the hospital after surgery: Your blood  sugar will be checked by the staff and you will probably be given insulin after surgery (instead of oral diabetes medicines) to make sure you have good blood sugar levels. The goal for blood sugar control after surgery is 80-180 mg/dL.   WHAT DO I DO ABOUT MY DIABETES MEDICATION?  Do not take oral diabetes medicines (pills) the morning of surgery.  THE NIGHT BEFORE SURGERY:  Take 22 units of Insulin Glargine.      The morning of surgery If your CBG is greater than 220 mg/dL, you may take  of your sliding scale (Insulin Lispro) (correction) dose of insulin.  Hold Trulicity 7 days before surgery (do not take after 08-02-22)  DO NOT TAKE THE FOLLOWING 7 DAYS PRIOR TO SURGERY: Ozempic, Wegovy, Rybelsus (Semaglutide), Byetta (exenatide), Bydureon (exenatide ER), Victoza,  Saxenda (liraglutide), or Trulicity (dulaglutide) Mounjaro (Tirzepatide) Adlyxin (Lixisenatide), Polyethylene Glycol Loxenatide.   Reviewed and Endorsed by [facility] Patient Education Committee, August 2015  Bring CPAP mask and tubing day of surgery.                              You may not have any metal on your body including jewelry, and body piercing             Do not wear  lotions, powders, cologne, or deodorant              Men may shave face and neck.   Do not bring valuables to the hospital. [facility] IS NOT RESPONSIBLE   FOR VALUABLES.   Contacts, dentures or bridgework may not be worn into surgery.   Bring small overnight bag day of surgery.   DO NOT BRING YOUR HOME MEDICATIONS TO THE HOSPITAL. PHARMACY WILL DISPENSE MEDICATIONS LISTED ON YOUR MEDICATION LIST TO YOU DURING YOUR ADMISSION IN THE HOSPITAL!   Special Instructions: Bring a copy of your healthcare power of attorney and living will documents the day of surgery if you haven't scanned them before.              Please read over the following fact sheets you were given: IF YOU HAVE QUESTIONS ABOUT YOUR PRE-OP INSTRUCTIONS PLEASE CALL [phone] [place]  If you received a COVID test during your pre-op visit  it is requested that you wear a mask when out in public, stay away from anyone that may not be feeling well and notify your surgeon if you develop symptoms. If you test positive for Covid or have been in contact with anyone that has tested positive in the last 10 days please notify you surgeon.   - Preparing for Surgery Before surgery, you can play an important role.  Because skin is not sterile, your skin needs to be as free of germs as possible.  You can reduce the number of germs on your skin by washing with CHG (chlorahexidine gluconate) soap before surgery.  CHG is an antiseptic cleaner which kills germs and bonds with the skin to continue killing germs even after washing. Please DO NOT use if you  have an allergy to CHG or antibacterial soaps.  If your skin becomes reddened/irritated stop using the CHG and inform your nurse when you arrive at Short Stay. Do not shave (including legs and underarms) for at least 48 hours prior to the first CHG shower.  You may shave your face/neck.  Please follow these instructions carefully:  1.  Shower with CHG Soap the  night before surgery and the  morning of surgery.  2.  If you choose to wash your hair, wash your hair first as usual with your normal  shampoo.  3.  After you shampoo, rinse your hair and body thoroughly to remove the shampoo.                             4.  Use CHG as you would any other liquid soap.  You can apply chg directly to the skin and wash.  Gently with a scrungie or clean washcloth.  5.  Apply the CHG Soap to your body ONLY FROM THE NECK DOWN.   Do   not use on face/ open                           Wound or open sores. Avoid contact with eyes, ears mouth and   genitals (private parts).                       Wash face,  Genitals (private parts) with your normal soap.             6.  Wash thoroughly, paying special attention to the area where your    surgery  will be performed.  7.  Thoroughly rinse your body with warm water from the neck down.  8.  DO NOT shower/wash with your normal soap after using and rinsing off the CHG Soap.                9.  Pat yourself dry with a clean towel.            10.  Wear clean pajamas.            11.  Place clean sheets on your bed the night of your first shower and do not  sleep with pets. Day of Surgery : Do not apply any lotions/deodorants the morning of surgery.  Please wear clean clothes to the hospital/surgery center.  FAILURE TO FOLLOW THESE INSTRUCTIONS MAY RESULT IN THE CANCELLATION OF YOUR SURGERY  PATIENT SIGNATURE_________________________________  NURSE SIGNATURE__________________________________  ________________________________________________________________________

## 2022-07-27 NOTE — Progress Notes (Addendum)
COVID Vaccine Completed:  Yes  Date of COVID positive in last 90 days:  PCP - Gwinda Passe, NP Cardiologist -   Chest x-ray -  EKG -  Stress Test -  ECHO -  Cardiac Cath -  Pacemaker/ICD device last checked: Spinal Cord Stimulator:  Bowel Prep -   Sleep Study -  CPAP -   Fasting Blood Sugar -  Checks Blood Sugar _____ times a day  Trulicity Last dose of GLP1 agonist-  N/A GLP1 instructions:  Hold 7 days before surgery (do not take after 08-02-22)   Last dose of SGLT-2 inhibitors-  N/A SGLT-2 instructions: N/A   Blood Thinner Instructions:  Time Aspirin Instructions: Last Dose:  Activity level:  Can go up a flight of stairs and perform activities of daily living without stopping and without symptoms of chest pain or shortness of breath.  Able to exercise without symptoms  Unable to go up a flight of stairs without symptoms of     Anesthesia review:  A1c 12.7 on recent labs  Patient denies shortness of breath, fever, cough and chest pain at PAT appointment  Patient verbalized understanding of instructions that were given to them at the PAT appointment. Patient was also instructed that they will need to review over the PAT instructions again at home before surgery.

## 2022-08-01 ENCOUNTER — Encounter (HOSPITAL_COMMUNITY): Payer: Self-pay | Admitting: Physician Assistant

## 2022-08-01 ENCOUNTER — Other Ambulatory Visit: Payer: Self-pay

## 2022-08-01 ENCOUNTER — Encounter (HOSPITAL_COMMUNITY): Payer: Self-pay

## 2022-08-01 ENCOUNTER — Encounter (HOSPITAL_COMMUNITY)
Admission: RE | Admit: 2022-08-01 | Discharge: 2022-08-01 | Disposition: A | Discharge status: Home / Self Care | Payer: Self-pay | Source: Ambulatory Visit | Attending: Urology | Admitting: Urology

## 2022-08-01 VITALS — BP 122/87 | HR 96 | Temp 98.9°F | Resp 16 | Ht 64.0 in | Wt 146.0 lb

## 2022-08-01 DIAGNOSIS — Z01818 Encounter for other preprocedural examination: Secondary | ICD-10-CM | POA: Insufficient documentation

## 2022-08-01 DIAGNOSIS — I10 Essential (primary) hypertension: Secondary | ICD-10-CM | POA: Insufficient documentation

## 2022-08-01 DIAGNOSIS — Z794 Long term (current) use of insulin: Secondary | ICD-10-CM | POA: Insufficient documentation

## 2022-08-01 DIAGNOSIS — E119 Type 2 diabetes mellitus without complications: Secondary | ICD-10-CM | POA: Insufficient documentation

## 2022-08-01 HISTORY — DX: Benign prostatic hyperplasia without lower urinary tract symptoms: N40.0

## 2022-08-01 HISTORY — DX: Gastro-esophageal reflux disease without esophagitis: K21.9

## 2022-08-01 LAB — CBC
HCT: 41.2 % (ref 39.0–52.0)
Hemoglobin: 13.5 g/dL (ref 13.0–17.0)
MCH: 27.8 pg (ref 26.0–34.0)
MCHC: 32.8 g/dL (ref 30.0–36.0)
MCV: 84.8 fL (ref 80.0–100.0)
Platelets: 255 10*3/uL (ref 150–400)
RBC: 4.86 MIL/uL (ref 4.22–5.81)
RDW: 14.4 % (ref 11.5–15.5)
WBC: 10.8 10*3/uL — ABNORMAL HIGH (ref 4.0–10.5)
nRBC: 0 % (ref 0.0–0.2)

## 2022-08-01 LAB — GLUCOSE, CAPILLARY: Glucose-Capillary: 326 mg/dL — ABNORMAL HIGH (ref 70–99)

## 2022-08-01 NOTE — Progress Notes (Signed)
Patient in for preop visit and urine culture ordered.  Patient has foley catheter, catheter clamped for 30 minutes and no urine obtained from tube.  Phoned and notified Dr. Shannan Harper CMA, Alcario Drought, that culture was not collected.

## 2022-08-10 ENCOUNTER — Encounter (HOSPITAL_COMMUNITY): Admission: RE | Payer: Self-pay | Source: Ambulatory Visit

## 2022-08-10 ENCOUNTER — Ambulatory Visit (HOSPITAL_COMMUNITY): Admission: RE | Admit: 2022-08-10 | Payer: Self-pay | Source: Ambulatory Visit | Admitting: Urology

## 2022-08-10 SURGERY — ABLATION, PROSTATE, TRANSURETHRAL, USING WATERJET
Anesthesia: General

## 2022-08-14 ENCOUNTER — Other Ambulatory Visit: Payer: Self-pay

## 2022-08-16 ENCOUNTER — Other Ambulatory Visit: Payer: Self-pay

## 2022-08-16 MED ORDER — TAMSULOSIN HCL 0.4 MG PO CAPS
0.4000 mg | ORAL_CAPSULE | Freq: Every day | ORAL | 3 refills | Status: DC
  Filled 2022-08-16: qty 90, 90d supply, fill #0
  Filled 2023-01-17: qty 90, 90d supply, fill #1

## 2022-08-16 MED ORDER — FINASTERIDE 5 MG PO TABS
5.0000 mg | ORAL_TABLET | Freq: Every day | ORAL | 3 refills | Status: DC
  Filled 2022-08-16: qty 90, 90d supply, fill #0
  Filled 2023-01-17 – 2023-05-28 (×2): qty 90, 90d supply, fill #1

## 2022-08-20 ENCOUNTER — Other Ambulatory Visit: Payer: Self-pay

## 2022-08-29 NOTE — Progress Notes (Signed)
S:     No chief complaint on file.  62 y.o. male who presents for diabetes evaluation, education, and management.  PMH is significant for T2DM, HTN, HLD, GERD, and urinary incontinence.   Patient was referred and last seen by Primary Care Provider, Gwinda Passe, on 07/16/2022. A1c 12.7% (up from 8.2% in February). Last seen by pharmacy clinic on 07/19/2022. At last visit, started Humalog 6 units before patient's largest meal.   Today, patient arrives in good spirits and presents without assistance. Interpretor assists with visit, Reyne Dumas [phone]. Diabetes longstanding. Patient reports adherence to taking his medications. Last week missed taking his Humalog once, of note reports taking his Humalog 15 units before lunch. He does not need any refills today, per medication dispense report he may be needing refills soon.  Patient denies hypoglycemic events.  Reported home fasting blood sugars: None  Reported 2 hour post-meal/random blood sugars: 150-220s.  Patient denies nocturia (nighttime urination).  Patient reports neuropathy (nerve pain) in his left foot. Patient reports visual changes. Patient reports self foot exams.   Current diabetes medications include: Basaglar 48 units (at night), humalog 6 units (before lunch), Trulicity 3 mg weekly (on Fridays), metformin 1000 mg XR BID (takes two 500 mg tabs BID) Current hypertension medications include: lisinopril 5 mg daily Current hyperlipidemia medications include: atorvastatin 40 mg daily  Family/Social History:  -Fhx: DM -Tobacco: former smoker  Insurance coverage: Self pay  Patient reported dietary habits: - Reports cutting back on carbs and tortillas; eats ~ 2 tortillas a day  Patient-reported exercise habits:  - Walks 10-20 minutes a day at the park  O:   ROS  Physical Exam  7 day average blood glucose: did not bring meter   Lab Results  Component Value Date   HGBA1C 12.7 (A) 07/16/2022   There were no  vitals filed for this visit.  Lipid Panel     Component Value Date/Time   CHOL 108 07/16/2022 0925   TRIG 227 (H) 07/16/2022 0925   HDL 36 (L) 07/16/2022 0925   CHOLHDL 3.0 07/16/2022 0925   CHOLHDL 4.7 03/07/2016 1046   VLDL 61 (H) 03/07/2016 1046   LDLCALC 36 07/16/2022 0925    Clinical Atherosclerotic Cardiovascular Disease (ASCVD): No  The ASCVD Risk score (Arnett DK, et al., 2019) failed to calculate for the following reasons:   The valid total cholesterol range is 130 to 320 mg/dL   Patient is participating in a Managed Medicaid Plan: No  A/P: Diabetes longstanding currently uncontrolled based on A1c. Patient is able to verbalize appropriate hypoglycemia management plan. Medication adherence appears appropriate. Sent in rxns for all of his medications for 90 DS. Confirmed patient picked them up at the pharmacy.  -Continued Basaglar (insulin glargine) 48 units at night. -Increased dose of Humalog (insulin lispro) from 6 units to 10 units before lunch.  -Continued Trulicity (dulaglutide) 3 mg weekly.  -Continued metformin 1000 mg XR BID.  -Patient educated on purpose, proper use, and potential adverse effects.  -Extensively discussed pathophysiology of diabetes, recommended lifestyle interventions, dietary effects on blood sugar control.  -Counseled on s/sx of and management of hypoglycemia.  -Next A1c anticipated August 2024.   ASCVD risk - primary prevention in patient with diabetes. Last LDL is 36, at goal of <70 mg/dL. ASCVD risk factors include DM and HTN. high intensity statin indicated.  -Continued atorvastatin 40 mg daily.   Hypertension longstanding currently controlled, based on office visit BP 119/79. Blood pressure goal of <130/80 mmHg.  Medication adherence appropriate.  -Continued lisinopril 5 mg daily.  Written patient instructions provided. Patient verbalized understanding of treatment plan.  Total time in face to face counseling 30 minutes.    Follow-up:   Pharmacist in 1 months. PCP clinic visit 10/01/2022.   Patient seen with  Alesia Banda, PharmD Candidate [employer] 20[employer]   Butch Penny, PharmD, [place], CPP Clinical Pharmacist [facility] [phone]

## 2022-08-30 ENCOUNTER — Other Ambulatory Visit: Payer: Self-pay

## 2022-08-30 ENCOUNTER — Ambulatory Visit: Payer: Self-pay | Attending: Primary Care | Admitting: Pharmacist

## 2022-08-30 ENCOUNTER — Encounter: Payer: Self-pay | Admitting: Pharmacist

## 2022-08-30 DIAGNOSIS — Z794 Long term (current) use of insulin: Secondary | ICD-10-CM

## 2022-08-30 DIAGNOSIS — Z760 Encounter for issue of repeat prescription: Secondary | ICD-10-CM

## 2022-08-30 DIAGNOSIS — E1165 Type 2 diabetes mellitus with hyperglycemia: Secondary | ICD-10-CM

## 2022-08-30 MED ORDER — METFORMIN HCL ER 500 MG PO TB24
1000.0000 mg | ORAL_TABLET | Freq: Two times a day (BID) | ORAL | 2 refills | Status: DC
Start: 2022-08-30 — End: 2023-02-15
  Filled 2022-08-30: qty 120, 30d supply, fill #0
  Filled 2022-10-10: qty 120, 30d supply, fill #1
  Filled 2022-11-20: qty 120, 30d supply, fill #2

## 2022-08-30 MED ORDER — BASAGLAR KWIKPEN 100 UNIT/ML ~~LOC~~ SOPN
48.0000 [IU] | PEN_INJECTOR | Freq: Every day | SUBCUTANEOUS | 2 refills | Status: DC
Start: 2022-08-30 — End: 2023-01-03
  Filled 2022-08-30: qty 15, 31d supply, fill #0
  Filled 2022-10-10: qty 15, 31d supply, fill #1

## 2022-08-30 MED ORDER — INSULIN LISPRO (1 UNIT DIAL) 100 UNIT/ML (KWIKPEN)
10.0000 [IU] | PEN_INJECTOR | Freq: Every day | SUBCUTANEOUS | 1 refills | Status: DC
  Filled 2022-08-30 – 2022-10-18 (×2): qty 3, 30d supply, fill #0
  Filled 2022-11-20: qty 3, 30d supply, fill #1

## 2022-09-13 ENCOUNTER — Other Ambulatory Visit: Payer: Self-pay

## 2022-09-13 MED ORDER — DOXYCYCLINE HYCLATE 50 MG PO CAPS
50.0000 mg | ORAL_CAPSULE | Freq: Two times a day (BID) | ORAL | 0 refills | Status: DC
  Filled 2022-09-13: qty 20, 10d supply, fill #0

## 2022-09-14 ENCOUNTER — Other Ambulatory Visit: Payer: Self-pay

## 2022-09-18 ENCOUNTER — Other Ambulatory Visit: Payer: Self-pay

## 2022-09-18 MED ORDER — CEFUROXIME AXETIL 250 MG PO TABS
250.0000 mg | ORAL_TABLET | Freq: Two times a day (BID) | ORAL | 0 refills | Status: DC
  Filled 2022-09-18: qty 14, 7d supply, fill #0

## 2022-10-01 ENCOUNTER — Encounter: Payer: Self-pay | Admitting: Pharmacist

## 2022-10-01 ENCOUNTER — Ambulatory Visit: Payer: Self-pay | Attending: Primary Care | Admitting: Pharmacist

## 2022-10-01 DIAGNOSIS — E1165 Type 2 diabetes mellitus with hyperglycemia: Secondary | ICD-10-CM

## 2022-10-01 DIAGNOSIS — Z7985 Long-term (current) use of injectable non-insulin antidiabetic drugs: Secondary | ICD-10-CM

## 2022-10-01 DIAGNOSIS — Z7984 Long term (current) use of oral hypoglycemic drugs: Secondary | ICD-10-CM

## 2022-10-01 DIAGNOSIS — Z794 Long term (current) use of insulin: Secondary | ICD-10-CM

## 2022-10-01 NOTE — Progress Notes (Signed)
    S:     No chief complaint on file.  62 y.o. male who presents for diabetes evaluation, education, and management.  PMH is significant for T2DM, HTN, HLD, GERD, and urinary incontinence.   Patient was referred and last seen by Primary Care Provider, Gwinda Passe, on 07/16/2022. A1c 12.7% (up from 8.2% in February). Last seen by pharmacy clinic on 08/20/2022. We increased Humalog to 10u before lunch (eats lunch ~3p).   Today, patient arrives in good spirits and presents without assistance. Interpretor assists with visit, Cala Bradford [phone]. Diabetes longstanding. Patient reports adherence to his medications. He does not need any refills today.  Patient denies hypoglycemic events.  Reported home fasting blood sugars: 110 - 180  Reported 2 hour post-meal/random blood sugars: 150-240s but check ~30 minutes after eating. Does not wait 2 hours post-prandial.  Patient denies nocturia (nighttime urination).  Patient reports neuropathy (nerve pain) in his left foot. Patient denies visual changes. Patient reports self foot exams.   Current diabetes medications include: Basaglar 48 units (at night), humalog 10 units (before lunch), Trulicity 3 mg weekly (on Fridays), metformin 1000 mg XR BID (takes two 500 mg tabs BID) Current hypertension medications include: lisinopril 5 mg daily Current hyperlipidemia medications include: atorvastatin 40 mg daily  Family/Social History:  -Fhx: DM -Tobacco: former smoker  Insurance coverage: Self pay  Patient reported dietary habits: - Reports cutting back on carbs and tortillas; eats ~ 2 tortillas a day  Patient-reported exercise habits:  - Walks 10-20 minutes a day at the park  O:   ROS  Physical Exam  7 day average blood glucose: did not bring meter   Lab Results  Component Value Date   HGBA1C 12.7 (A) 07/16/2022   There were no vitals filed for this visit.  Lipid Panel     Component Value Date/Time   CHOL 108 07/16/2022 0925    TRIG 227 (H) 07/16/2022 0925   HDL 36 (L) 07/16/2022 0925   CHOLHDL 3.0 07/16/2022 0925   CHOLHDL 4.7 03/07/2016 1046   VLDL 61 (H) 03/07/2016 1046   LDLCALC 36 07/16/2022 0925    Clinical Atherosclerotic Cardiovascular Disease (ASCVD): No  The ASCVD Risk score (Arnett DK, et al., 2019) failed to calculate for the following reasons:   The valid total cholesterol range is 130 to 320 mg/dL   Patient is participating in a Managed Medicaid Plan: No  A/P: Diabetes longstanding currently uncontrolled based on A1c. Home sugars look to be improving. Patient is able to verbalize appropriate hypoglycemia management plan. Medication adherence appears appropriate.  -Continued Basaglar (insulin glargine) 48 units at night. -Continued Humalog (insulin lispro) 10 units before lunch.  -Continued Trulicity (dulaglutide) 3 mg weekly.  -Continued metformin 1000 mg XR BID.  -Patient educated on purpose, proper use, and potential adverse effects.  -Extensively discussed pathophysiology of diabetes, recommended lifestyle interventions, dietary effects on blood sugar control.  -Counseled on s/sx of and management of hypoglycemia.  -Next A1c anticipated August 2024.   Written patient instructions provided. Patient verbalized understanding of treatment plan.  Total time in face to face counseling 30 minutes.    Follow-up:  PCP clinic visit 8/13.   Butch Penny, PharmD, Patsy [place], CPP Clinical Pharmacist [facility] [phone]

## 2022-10-10 ENCOUNTER — Other Ambulatory Visit: Payer: Self-pay

## 2022-10-11 ENCOUNTER — Other Ambulatory Visit: Payer: Self-pay

## 2022-10-11 MED ORDER — LIDOCAINE 3 % EX CREA
TOPICAL_CREAM | CUTANEOUS | 0 refills | Status: DC
  Filled 2022-10-11: qty 28.35, 30d supply, fill #0

## 2022-10-12 ENCOUNTER — Other Ambulatory Visit: Payer: Self-pay

## 2022-10-15 ENCOUNTER — Telehealth (INDEPENDENT_AMBULATORY_CARE_PROVIDER_SITE_OTHER): Payer: Self-pay | Admitting: Primary Care

## 2022-10-15 NOTE — Telephone Encounter (Signed)
Spoke to pt. Will be at apt.  

## 2022-10-16 ENCOUNTER — Ambulatory Visit (INDEPENDENT_AMBULATORY_CARE_PROVIDER_SITE_OTHER): Payer: Self-pay | Admitting: Primary Care

## 2022-10-16 ENCOUNTER — Encounter (INDEPENDENT_AMBULATORY_CARE_PROVIDER_SITE_OTHER): Payer: Self-pay | Admitting: Primary Care

## 2022-10-16 VITALS — BP 135/73 | HR 67 | Temp 97.6°F | Wt 150.0 lb

## 2022-10-16 DIAGNOSIS — E1165 Type 2 diabetes mellitus with hyperglycemia: Secondary | ICD-10-CM

## 2022-10-16 DIAGNOSIS — I10 Essential (primary) hypertension: Secondary | ICD-10-CM

## 2022-10-16 DIAGNOSIS — Z7984 Long term (current) use of oral hypoglycemic drugs: Secondary | ICD-10-CM

## 2022-10-16 DIAGNOSIS — Z760 Encounter for issue of repeat prescription: Secondary | ICD-10-CM

## 2022-10-16 DIAGNOSIS — Z794 Long term (current) use of insulin: Secondary | ICD-10-CM

## 2022-10-16 LAB — POCT GLYCOSYLATED HEMOGLOBIN (HGB A1C): HbA1c, POC (controlled diabetic range): 9.6 % — AB (ref 0.0–7.0)

## 2022-10-16 LAB — GLUCOSE, POCT (MANUAL RESULT ENTRY): POC Glucose: 68 mg/dl — AB (ref 70–99)

## 2022-10-16 NOTE — Progress Notes (Signed)
Subjective:  Joseph Roberts ID: Joseph Roberts, male    DOB: 06-08-1960  Age: 62 y.o. MRN: [id number]  CC: DM  HPI Joseph Roberts presents forFollow-up of diabetes. Joseph Roberts does not check blood sugar at home  Compliant with meds - Yes Checking CBGs? Yes  Fasting avg - 90-150  Postprandial average -  Exercising regularly? - Yes Watching carbohydrate intake? - No Neuropathy ? - no Hypoglycemic events - No  - Recovers with :   Pertinent ROS:  Polyuria - No Polydipsia - No Vision problems - No  Medications as noted below. Taking them regularly without complication/adverse reaction being reported today.   [name] has a past medical history of BPH (benign prostatic hyperplasia), Diabetes mellitus (2011), GERD (gastroesophageal reflux disease), High triglycerides, and Hypertension.   Joseph Roberts has a past surgical history that includes Colonoscopy (N/A, 10/24/2013); Cystoscopy with fulgeration (N/A, 03/04/2022); and Finger surgery.   Joseph Roberts family history includes Diabetes in Joseph Roberts brother, mother, and sister.Joseph Roberts reports that Joseph Roberts has been smoking cigarettes. Joseph Roberts has a 15 pack-year smoking history. Joseph Roberts has never used smokeless tobacco. Joseph Roberts reports that Joseph Roberts does not drink alcohol and does not use drugs.  Current Outpatient Medications on File Prior to Visit  Medication Sig Dispense Refill   atorvastatin (LIPITOR) 40 MG tablet Take 1 tablet (40 mg total) by mouth daily. 90 tablet 1   Dulaglutide (TRULICITY) 3 MG/0.5ML SOPN Inject 3 mg as directed once a week. (Joseph Roberts taking differently: Inject 3 mg as directed every Friday.) 2 mL 3   finasteride (PROSCAR) 5 MG tablet Take 1 tablet (5 mg total) by mouth daily. 90 tablet 3   finasteride (PROSCAR) 5 MG tablet Take 1 tablet (5 mg total) by mouth daily. 90 tablet 3   Insulin Glargine (BASAGLAR KWIKPEN) 100 UNIT/ML Inject 48 Units into the skin daily. 15 mL 2   insulin lispro (HUMALOG KWIKPEN) 100 UNIT/ML KwikPen Inject 10 Units into the skin  daily. Take before the largest meal of the day. 3 mL 1   metFORMIN (GLUCOPHAGE-XR) 500 MG 24 hr tablet Take 2 tablets (1,000 mg total) by mouth 2 (two) times daily. 120 tablet 2   oxybutynin (DITROPAN) 5 MG tablet Take 1 tablet (5 mg total) by mouth 3 (three) times daily as needed. 30 tablet 2   tamsulosin (FLOMAX) 0.4 MG CAPS capsule Take 1 capsule (0.4 mg total) by mouth daily. 90 capsule 3   tamsulosin (FLOMAX) 0.4 MG CAPS capsule Take 1 capsule (0.4 mg total) by mouth daily. 90 capsule 3   Blood Glucose Monitoring Suppl (ONE TOUCH ULTRA MINI) W/DEVICE KIT 1 each by Does not apply route daily. Test fasting daily     glucose blood (TRUE METRIX BLOOD GLUCOSE TEST) test strip Use as instructed. Check blood glucose level by fingerstick three times per day. 100 each 12   Insulin Pen Needle 31G X 5 MM MISC use as directed to inject insulin 4 times daily 200 each 3   Insulin Syringe-Needle U-100 (TRUEPLUS INSULIN SYRINGE) 31G X 5/16" 0.3 ML MISC Use to inject Humalog three times daily. 100 each 2   Lidocaine 3 % CREA Apply topically every 6 hours as needed. 28.35 g 0   lisinopril (ZESTRIL) 5 MG tablet Take 1 tablet (5 mg total) by mouth daily. 90 tablet 1   neomycin-bacitracin-polymyxin (NEOSPORIN) OINT Apply 1 Application topically as needed for wound care.     oxybutynin (DITROPAN) 5 MG tablet TAKE 1 TABLET BY MOUTH TWICE DAILY AS NEEDED. (Joseph Roberts not  taking: Reported on 07/31/2022) 30 tablet 0   tamsulosin (FLOMAX) 0.4 MG CAPS capsule Take 1 capsule (0.4 mg total) by mouth daily. 90 capsule 3   TRUEplus Lancets 28G MISC Use as instructed. Check blood glucose level by fingerstick three times per day. 100 each 3   No current facility-administered medications on file prior to visit.    ROS Comprehensive ROS Pertinent positive and negative noted in HPI    Objective:  Blood Pressure 135/73 (BP Location: Left Arm, Joseph Roberts Position: Sitting, Cuff Size: Normal)   Pulse 67   Temperature 97.6 F (36.4  C) (Oral)   Weight 150 lb (68 kg)   Oxygen Saturation 99%   Body Mass Index 25.75 kg/m   BP Readings from Last 3 Encounters:  10/16/22 135/73  08/01/22 122/87  07/16/22 119/79    Wt Readings from Last 3 Encounters:  10/16/22 150 lb (68 kg)  08/01/22 146 lb (66.2 kg)  07/16/22 149 lb 3.2 oz (67.7 kg)    Physical Exam Vitals reviewed.  HENT:     Head: Normocephalic.     Right Ear: External ear normal.     Left Ear: External ear normal.     Nose: Nose normal.  Eyes:     Extraocular Movements: Extraocular movements intact.  Cardiovascular:     Rate and Rhythm: Normal rate and regular rhythm.  Pulmonary:     Effort: Pulmonary effort is normal.     Breath sounds: Normal breath sounds.  Abdominal:     General: Bowel sounds are normal. There is distension.     Palpations: Abdomen is soft.  Musculoskeletal:        General: Normal range of motion.     Cervical back: Normal range of motion.  Skin:    General: Skin is warm and dry.  Neurological:     Mental Status: Joseph Roberts is alert and oriented to person, place, and time.  Psychiatric:        Mood and Affect: Mood normal.        Behavior: Behavior normal.    Lab Results  Component Value Date   HGBA1C 9.6 (A) 10/16/2022   HGBA1C 12.7 (A) 07/16/2022   HGBA1C 8.2 (A) 04/11/2022    Lab Results  Component Value Date   WBC 10.8 (H) 08/01/2022   HGB 13.5 08/01/2022   HCT 41.2 08/01/2022   PLT 255 08/01/2022   GLUCOSE 270 (H) 07/16/2022   CHOL 108 07/16/2022   TRIG 227 (H) 07/16/2022   HDL 36 (L) 07/16/2022   LDLCALC 36 07/16/2022   ALT 37 07/16/2022   AST 21 07/16/2022   NA 139 07/16/2022   K 4.2 07/16/2022   CL 106 07/16/2022   CREATININE 0.67 (L) 07/16/2022   BUN 10 07/16/2022   CO2 17 (L) 07/16/2022   TSH 1.976 04/06/2013   INR 1.1 03/01/2022   HGBA1C 9.6 (A) 10/16/2022   MICROALBUR 34.9 10/17/2015     Assessment & Plan:  Joseph Roberts was seen today for diabetes.  Diagnoses and all orders for this  visit:  Uncontrolled type 2 diabetes mellitus with hyperglycemia (HCC) -     HgB A1c 9.6 - educated on lifestyle modifications, including but not limited to diet choices and adding exercise to daily routine.    Essential hypertension BP goal - < 130/80 Explained that having normal blood pressure is the goal and medications are helping to get to goal and maintain normal blood pressure. DIET: Limit salt intake, read nutrition labels to check  salt content, limit fried and high fatty foods  Avoid using multisymptom OTC cold preparations that generally contain sudafed which can rise BP. Consult with pharmacist on best cold relief products to use for persons with HTN EXERCISE Discussed incorporating exercise such as walking - 30 minutes most days of the week and can do in 10 minute intervals     Medication refill    Basaglar KwikPen, metFORMIN, insulin lispro,  I have discontinued Stormy Granados-Samperio's True Metrix Meter, traMADol, and cephALEXin. I am also having Joseph Roberts maintain Joseph Roberts ONE TOUCH ULTRA MINI, Trulicity, tamsulosin, finasteride, tamsulosin, oxybutynin, oxybutynin, lisinopril, TRUEplus Lancets 28G, Insulin Syringe-Needle U-100, Insulin Pen Needle, True Metrix Blood Glucose Test, atorvastatin, neomycin-bacitracin-polymyxin, finasteride, tamsulosin, Basaglar KwikPen, metFORMIN, insulin lispro, doxycycline, cefUROXime, and Lidocaine.  Follow-up:  3 months  The above assessment and management plan was discussed with the Joseph Roberts. The Joseph Roberts verbalized understanding of and has agreed to the management plan. Joseph Roberts is aware to call the clinic if symptoms fail to improve or worsen. Joseph Roberts is aware when to return to the clinic for a follow-up visit. Joseph Roberts educated on when it is appropriate to go to the emergency department.   Gwinda Passe, NP-C

## 2022-10-16 NOTE — Progress Notes (Signed)
Due to language barrier, an interpreter was used.  Interpreter name Cornelia Copa Interpreter ID [phone], Reason for encounter--OV  A1C- 9.6

## 2022-10-18 ENCOUNTER — Other Ambulatory Visit: Payer: Self-pay

## 2022-10-19 ENCOUNTER — Encounter (INDEPENDENT_AMBULATORY_CARE_PROVIDER_SITE_OTHER): Payer: Self-pay | Admitting: Primary Care

## 2022-11-20 ENCOUNTER — Other Ambulatory Visit: Payer: Self-pay

## 2022-11-27 ENCOUNTER — Other Ambulatory Visit: Payer: Self-pay | Admitting: Urology

## 2022-11-30 NOTE — Progress Notes (Unsigned)
S:     62 y.o. male who presents for diabetes evaluation, education, and management. Patient arrives in *** good spirits and presents without *** any assistance. ***Patient is accompanied by ***.   Patient was referred and last seen by Primary Care Provider, Dr. ***, on ***.  *** Patient was referred by *** on ***. Patient was last seen by Primary Care Provider, Dr. ***, on ***.   PMH is significant for ***.  At last visit, ***.   Patient reports Diabetes was diagnosed in ***.   Family/Social History: ***  Current diabetes medications include: *** Current hypertension medications include: *** Current hyperlipidemia medications include: ***  Patient reports adherence to taking all medications as prescribed.  *** Patient denies adherence with medications, reports missing *** medications *** times per week, on average.  Do you feel that your medications are working for you? {YES NO:22349} Have you been experiencing any side effects to the medications prescribed? {YES NO:22349} Do you have any problems obtaining medications due to transportation or finances? {YES [id number] Insurance coverage: ***  Patient {Actions; denies-reports:120008} hypoglycemic events.  Reported home fasting blood sugars: ***  Reported 2 hour post-meal/random blood sugars: ***.  Patient {Actions; denies-reports:120008} nocturia (nighttime urination).  Patient {Actions; denies-reports:120008} neuropathy (nerve pain). Patient {Actions; denies-reports:120008} visual changes. Patient {Actions; denies-reports:120008} self foot exams.   Patient reported dietary habits: Eats *** meals/day Breakfast: *** Lunch: *** Dinner: *** Snacks: *** Drinks: ***  Within the past 12 months, did you worry whether your food would run out before you got money to buy more? {YES NO:22349} Within the past 12 months, did the food you bought run out, and you didn't have money to get more? {YES NO:22349} PHQ-9 Score:  ***  Patient-reported exercise habits: ***   O:   ROS  Physical Exam  7 day average blood glucose: ***  Libre3 *** CGM Download today *** on *** % Time CGM is active: ***% Average Glucose: *** mg/dL Glucose Management Indicator: ***  Glucose Variability: ***% (goal <36%) Time in Goal:  - Time in range 70-180: ***% - Time above range: ***% - Time below range: ***% Observed patterns:   Lab Results  Component Value Date   HGBA1C 9.6 (A) 10/16/2022   There were no vitals filed for this visit.  Lipid Panel     Component Value Date/Time   CHOL 108 07/16/2022 0925   TRIG 227 (H) 07/16/2022 0925   HDL 36 (L) 07/16/2022 0925   CHOLHDL 3.0 07/16/2022 0925   CHOLHDL 4.7 03/07/2016 1046   VLDL 61 (H) 03/07/2016 1046   LDLCALC 36 07/16/2022 0925    Clinical Atherosclerotic Cardiovascular Disease (ASCVD): {YES/NO:21197} The ASCVD Risk score (Arnett DK, et al., 2019) failed to calculate for the following reasons:   The valid total cholesterol range is 130 to 320 mg/dL   Patient is participating in a Managed Medicaid Plan:  {MM YES/NO:27447::"Yes"}   A/P: Diabetes longstanding *** currently ***. Patient is *** able to verbalize appropriate hypoglycemia management plan. Medication adherence appears ***. Control is suboptimal due to ***. -{Meds adjust:18428} basal insulin *** Lantus/Basaglar/Semglee (insulin glargine) *** Tresiba (insulin degludec) from *** units to *** units daily in the morning. Patient will continue to titrate 1 unit every *** days if fasting blood sugar > 100mg /dl until fasting blood sugars reach goal or next visit.  -{Meds adjust:18428} rapid insulin *** Novolog (insulin aspart) *** Humalog (insulin lispro) from *** to ***.  -{Meds adjust:18428} GLP-1 *** Trulicity (dulaglutide) ***  Ozempic (semaglutide) *** Mounjaro (tirzepatide) from *** mg to *** mg .  -{Meds adjust:18428} SGLT2-I *** Farxiga (dapagliflozin) *** Jardiance (empagliflozin) 10 mg. Counseled  on sick day rules. -{Meds adjust:18428} metformin ***.  -Patient educated on purpose, proper use, and potential adverse effects of ***.  -Extensively discussed pathophysiology of diabetes, recommended lifestyle interventions, dietary effects on blood sugar control.  -Counseled on s/sx of and management of hypoglycemia.  -Next A1c anticipated ***.   ASCVD risk - primary ***secondary prevention in patient with diabetes. Last LDL is *** not at goal of <16 *** mg/dL. ASCVD risk factors include *** and 10-year ASCVD risk score of ***. {Desc; low/moderate/high:110033} intensity statin indicated.  -{Meds adjust:18428} ***statin *** mg.   Hypertension longstanding *** currently ***. Blood pressure goal of <130/80 *** mmHg. Medication adherence ***. Blood pressure control is suboptimal due to ***. -{Meds adjust:18428} *** mg.  Written patient instructions provided. Patient verbalized understanding of treatment plan.  Total time in face to face counseling *** minutes.    Follow-up:  Pharmacist *** PCP clinic visit in *** Patient seen with ***

## 2022-12-03 ENCOUNTER — Ambulatory Visit: Payer: Self-pay | Attending: Primary Care | Admitting: Pharmacist

## 2022-12-03 ENCOUNTER — Other Ambulatory Visit: Payer: Self-pay

## 2022-12-03 ENCOUNTER — Encounter: Payer: Self-pay | Admitting: Pharmacist

## 2022-12-03 DIAGNOSIS — Z760 Encounter for issue of repeat prescription: Secondary | ICD-10-CM

## 2022-12-03 DIAGNOSIS — Z794 Long term (current) use of insulin: Secondary | ICD-10-CM

## 2022-12-03 DIAGNOSIS — E1165 Type 2 diabetes mellitus with hyperglycemia: Secondary | ICD-10-CM

## 2022-12-03 MED ORDER — TRULICITY 3 MG/0.5ML ~~LOC~~ SOAJ
3.0000 mg | SUBCUTANEOUS | 1 refills | Status: DC
Start: 2022-12-03 — End: 2023-02-15
  Filled 2022-12-03: qty 2, 28d supply, fill #0

## 2022-12-06 ENCOUNTER — Other Ambulatory Visit: Payer: Self-pay

## 2022-12-07 ENCOUNTER — Other Ambulatory Visit: Payer: Self-pay

## 2022-12-12 ENCOUNTER — Other Ambulatory Visit: Payer: Self-pay

## 2022-12-17 ENCOUNTER — Other Ambulatory Visit: Payer: Self-pay

## 2022-12-17 MED ORDER — AMOXICILLIN-POT CLAVULANATE 500-125 MG PO TABS
ORAL_TABLET | ORAL | 0 refills | Status: DC
  Filled 2022-12-17: qty 14, 7d supply, fill #0

## 2022-12-18 ENCOUNTER — Other Ambulatory Visit: Payer: Self-pay

## 2023-01-03 ENCOUNTER — Ambulatory Visit: Payer: Self-pay | Attending: Primary Care | Admitting: Pharmacist

## 2023-01-03 ENCOUNTER — Other Ambulatory Visit: Payer: Self-pay

## 2023-01-03 ENCOUNTER — Encounter: Payer: Self-pay | Admitting: Pharmacist

## 2023-01-03 DIAGNOSIS — Z7984 Long term (current) use of oral hypoglycemic drugs: Secondary | ICD-10-CM

## 2023-01-03 DIAGNOSIS — Z760 Encounter for issue of repeat prescription: Secondary | ICD-10-CM

## 2023-01-03 DIAGNOSIS — Z794 Long term (current) use of insulin: Secondary | ICD-10-CM

## 2023-01-03 DIAGNOSIS — Z7985 Long-term (current) use of injectable non-insulin antidiabetic drugs: Secondary | ICD-10-CM

## 2023-01-03 DIAGNOSIS — E1165 Type 2 diabetes mellitus with hyperglycemia: Secondary | ICD-10-CM

## 2023-01-03 LAB — POCT GLYCOSYLATED HEMOGLOBIN (HGB A1C): HbA1c, POC (controlled diabetic range): 10.6 % — AB (ref 0.0–7.0)

## 2023-01-03 MED ORDER — BASAGLAR KWIKPEN 100 UNIT/ML ~~LOC~~ SOPN
48.0000 [IU] | PEN_INJECTOR | Freq: Every day | SUBCUTANEOUS | 1 refills | Status: DC
Start: 2023-01-03 — End: 2023-09-24
  Filled 2023-01-03: qty 15, 31d supply, fill #0
  Filled 2023-02-15: qty 15, 31d supply, fill #1
  Filled 2023-03-29: qty 15, 31d supply, fill #2
  Filled 2023-05-28 – 2023-07-05 (×2): qty 15, 31d supply, fill #3
  Filled 2023-08-09: qty 15, 31d supply, fill #4
  Filled 2023-09-03: qty 15, 31d supply, fill #5

## 2023-01-03 MED ORDER — INSULIN LISPRO (1 UNIT DIAL) 100 UNIT/ML (KWIKPEN)
10.0000 [IU] | PEN_INJECTOR | Freq: Two times a day (BID) | SUBCUTANEOUS | 1 refills | Status: DC
  Filled 2023-01-03: qty 6, 30d supply, fill #0
  Filled 2023-02-15: qty 6, 30d supply, fill #1
  Filled 2023-03-29: qty 6, 30d supply, fill #2

## 2023-01-03 NOTE — Progress Notes (Signed)
S:     62 y.o. male who presents for diabetes evaluation, education, and management. PMH is significant for BPH, diabetes mellitus (2011), HLD, and HTN.   Patient was last seen by Marcelino Duster on 10/16/2022 and last seen by pharmacist on 12/03/2022. At that visit with [place] last month, his reported home sugar control looked good. We made no changes.  Patient arrives in good spirits and Spanish Interpreter [phone], Edelin) used. Of note, patient has an upcoming prostate surgery in November. Today, he reports doing well. Unfortunately, he endorses higher blood sugars and his A1c today shows worsening DM control (10.6%). He endorses medication adherence and denies any missed doses.   Family/Social History:  - FHx: diabetes (mother, brother, sister) - Tobacco Use: Current -15 pack-year smoking history   Current diabetes medications include: Basaglar 48 units (at night), Humalog 10 units (before lunch), Trulicity 3 mg weekly (took Friday), metformin 1000 mg XR BID (takes two 500 mg tabs BID) Current hypertension medications include: lisinopril 5 mg daily Current hyperlipidemia medications include: atorvastatin 40 mg daily  Patient reports adherence to taking all medications as prescribed.   Insurance coverage: Self-Pay  Patient denies hypoglycemic events.  Reported home fasting blood sugars: 157 - 180  Reported 2 hour post-meal/random blood sugars: Rarely >200 since last visit. Most of the time: 170s-180s.  Patient denies nocturia (nighttime urination).  Patient denies neuropathy (nerve pain). No changes in neuropathy but some pain in L leg.  Patient denies visual changes. Patient reports self foot exams.   Patient reported dietary habits: - Reports cutting back on carbs and tortillas; eats ~ 2 tortillas a day - No changes in diet  - Usually only eats 1 meal daily at lunch    Patient-reported exercise habits:  - Walks 10-20 minutes a day at the park - Reports he is still walking daily    O:   Lab Results  Component Value Date   HGBA1C 10.6 (A) 01/03/2023   There were no vitals filed for this visit.  Lipid Panel     Component Value Date/Time   CHOL 108 07/16/2022 0925   TRIG 227 (H) 07/16/2022 0925   HDL 36 (L) 07/16/2022 0925   CHOLHDL 3.0 07/16/2022 0925   CHOLHDL 4.7 03/07/2016 1046   VLDL 61 (H) 03/07/2016 1046   LDLCALC 36 07/16/2022 0925    Clinical Atherosclerotic Cardiovascular Disease (ASCVD): No  The ASCVD Risk score (Arnett DK, et al., 2019) failed to calculate for the following reasons:   The valid total cholesterol range is 130 to 320 mg/dL   Patient is participating in a Managed Medicaid Plan: No   A/P: Diabetes longstanding, currently uncontrolled. A1c is higher than anticipated given his reported home sugars. He is not hypoglycemic and is able to verbalize appropriate hypoglycemia management plan. Does endorse blurred vision and some neuropathy. I suspect either he is not checking home glucose values often enough or he's under-reporting. Either way, we will back off of basal insulin given his current dose is quite a bit above 0.5u/kg of total body weight and increase Humalog. Medication adherence appears  to be good.  -Decrease Basaglar from 48 to 40 units at night -Increase Humalog to 10 units BID before breakfast and lunch (he does not eat dinner).  -Hold Trulicity 3 mg weekly (on Fridays) for now given upcoming surgery.  -Continue metformin 1000 mg XR BID (takes two 500 mg tabs BID) -Patient educated on purpose, proper use, and potential adverse effects of medications.  -  Extensively discussed pathophysiology of diabetes, recommended lifestyle interventions, dietary effects on blood sugar control.  -Counseled on s/sx of and management of hypoglycemia.  -POCT glycosylated A1c   Written patient instructions provided. Patient verbalized understanding of treatment plan.  Total time in face to face counseling 30 minutes.    Follow-up:  PCP  clinic visit on 01/16/2023. Me: 02/15/23  Butch Penny, PharmD, BCACP, CPP Clinical Pharmacist [facility] [phone]

## 2023-01-09 ENCOUNTER — Other Ambulatory Visit: Payer: Self-pay

## 2023-01-10 NOTE — Progress Notes (Addendum)
COVID Vaccine received:  []  No [x]  Yes Date of any COVID positive Test in last 90 days:   None  PCP - Gwinda Passe, NP  Cardiologist - none  Chest x-ray - (10-23-2013  2v  Epic) EKG -  08-01-22  Epic Stress Test -  ECHO -  Cardiac Cath -   PCR screen: []  Ordered & Completed []   No Order but Needs PROFEND     [x]   N/A for this surgery  Surgery Plan:  []  Ambulatory   [x]  Outpatient in bed  []  Admit Anesthesia:    []  General  []  Spinal  []   Choice []   MAC  Bowel Prep - [x]  No  []   Yes ______  Pacemaker / ICD device [x]  No []  Yes   Spinal Cord Stimulator:[x]  No []  Yes       History of Sleep Apnea? [x]  No []  Yes   CPAP used?- [x]  No []  Yes    Does the patient monitor blood sugar?   []  N/A   []  No [x]  Yes  Patient has: []  NO Hx DM   []  Pre-DM   []  DM1  [x]   DM2  uncontrolled  Last A1c was:  10.6 on  01-03-2023    Does patient have a [employer] or Dexacom? [x]  No []  Yes   Fasting Blood Sugar Ranges- 100-150 Checks Blood Sugar __2_ times a weekly  GLP1 agonist / usual dose - Trulicity is on hold already Other Diabetic medications/ instructions:  Metformin- Day before surgery- take as usual.    DAY OF SURGERY: DO NOT TAKE METFORMIN.  Insulin Glargine (Basaglar) usual 40 units Day before surgery: take 50% of bedtime dose (20 units)    Insulin Lispro (Humalog)  Day before surgery- Take as usual at breakfast and lunch,  DAY OF SURGERY: if CBG is greater than 220 mg/dL, may take 1/2 of usual correction dose.   Blood Thinner / Instructions: none Aspirin Instructions: none  ERAS Protocol Ordered: [x]  No  []  Yes Patient is to be NPO after: midnight prior  Dental hx: []  Dentures:  [x]  N/A      []  Bridge or Partial:                   []  Loose or Damaged teeth:   Comments: Spanish Interpreter present at PST: Started with [id number] and then Luis from [facility]  Patient stated that he was unable to clamp off his foley (leg bag) to do the urine culture that was ordered by Dr.  Alvester Morin.   Activity level: Patient is able to climb a flight of stairs without difficulty; [x]  No CP  [x]  No SOB Patient can perform ADLs without assistance.   Anesthesia review: uncontrolled DM2, HTN, Smoker, GERD, anemia  Patient denies shortness of breath, fever, cough and chest pain at PAT appointment.  Patient verbalized understanding and agreement to the Pre-Surgical Instructions that were given to them at this PAT appointment. Patient was also educated of the need to review these PAT instructions again prior to his surgery.I reviewed the appropriate phone numbers to call if they have any and questions or concerns.

## 2023-01-10 NOTE — Patient Instructions (Addendum)
DEBIDO AL COVID-19 SLO SE PERMITEN DOS VISITANTES (de 16 aos en adelante)  PARA QUE VENGAN CON USTED Y SE QUEDEN EN LA SALA DE ESPERA SOLAMENTE DURANTE EL PRE OP Y EL PROCEDIMIENTO.   **NO SE PERMITEN VISITAS EN EL REA DE CORTA ESTADA NI EN LA SALA DE RECUPERACIN!!**  SI VA A SER INGRESADO(A) AL HOSPITAL SLO SE LE PERMITEN CUATRO PERSONAS DE APOYO DURANTE LAS HORAS DE VISITA (7 AM -8PM)   La(s) persona(s) de apoyo debe(n) pasar nuestra evaluacin, entrar y salir con gel y Facilities manager en todo momento, incluso en la habitacin del [place]. Los pacientes tambin deben usar una mscara cuando el personal o su persona de apoyo estn en la habitacin. Los visitantes DEBEN LLEVAR ETIQUETA DE VISITANTE DE UNA MANERA VISIBLE. Un visitante adulto Insurance claims handler con usted durante la noche y DEBE estar en la habitacin a las 8 P.M.     Su procedimiento est programado en: MONDAY  01-14-2023   Presntese a la entrada principal del [facility]     Presntese a admisiones 05:15  por la maana   Llame a este nmero si tiene problemas la maana de la ciruga al [phone]   No consuma alimentos: Despus de la medianoche    SIGA LA PREPARACIN INTESTINAL Y CUALQUIER INSTRUCCIN ADICIONAL PREOPERATORIA QUE HAYA RECIBIDO DEL OFICINA DE DU CIRUJANO!!!     La higiene bucal tambin es importante para reducir el riesgo de infeccin.                                   Recuerde - LVESE LOS DIENTES EN LA MAANA DE LA CIRUGA CON SU PASTA DENTAL HABITUAL   NO fume despus de la medianoche   [employer] medicamentos en la maana de la ciruga con UN SORBO DE AGUA: none   COMO CONTROLAR LA DIABETES ANTES Y DESPUS DE LA CIRUGA  Por qu es importante controlar los niveles de azcar en la sangre antes y despus de [place] ciruga?   Mejorar los niveles de azcar en la sangre antes y despus de [place] ayuda a la recuperacin y [employer].   Una forma de mejorar el  control de los niveles de azcar es seguir una dieta saludable:  o Comiendo menos azcar y carbohidratos  o Aumentando la actividad y el ejercicio  o Hablar con su doctor sobre cmo lograr sus [employer] en cuanto a los niveles de Banker   Los niveles altos de Banker (superiores a 180 mg/dL) pueden aumentar el riesgo de infecciones y Estate agent, por esta razn deber enfocarse en controlar su diabetes durante las semanas previas a la [place].   Asegrese de que el doctor(a) que le atiende su diabetes est informado(a) de su ciruga programada, incluyendo la fecha y Immunologist.  Cmo controlar mi nivel de azcar en la sangre antes de la ciruga?   Revise su nivel de azcar en la sangre por lo menos 4 veces al da, comenzando 2 [address] antes de la [place], para asegurarse de que el nivel no est demasiado alto o bajo.  o Revise su nivel de azcar en la maana de la ciruga al levantarse y cada 2 horas hasta que llegue a la unidad de corta estada.   Si su nivel de azcar en la sangre es inferior a 70 mg/dL, necesitar tratamiento para el nivel bajo  de azcar en la sangre:  o No se ponga insulina.  o En caso de que el nivel de azcar est bajo (inferior a 70 mg/dL) tome  vaso de jugo claro/transparente (arndano o [place]), 4 tabletas de glucosa, O gel de glucosa.  o Vuelva a revisar el nivel de azcar a los 15 minutos despus del tratamiento (para asegurarse de que es superior a 70 mg/dL). Si su nivel de azcar no es superior a 70 mg/dL en la nueva revisin, llame al [phone] para recibir ms instrucciones.   Informe de su nivel de azcar a la enfermera de la unidad de corta estada cuando llegue all.   Si le ingresan al hospital despus de la ciruga:  o Su nivel de azcar ser revisado por el personal y Barrister's clerk darn insulina despus de la ciruga (en lugar de medicamentos orales para la diabetes) para asegurarse de que tiene  buenos niveles de International aid/development worker en la sangre.  o El objetivo para el control de los niveles de International aid/development worker en la sangre despus de la ciruga es de 80-180 mg/dL.  QU HAGO CON MIS MEDICAMENTOS PARA LA DIABETES?   No tome medicamentos orales para la diabetes (pastillas) en la maana de la [place].  Metformin- Day before surgery- take as usual.                    DAY OF SURGERY: DO NOT TAKE METFORMIN.   Insulin Glargine (Basaglar)  Day before surgery: take 50% of bedtime dose (20 units)  DAY OF SURGERY: nunca   Insulin Lispro (Humalog)   Day before surgery- Take as usual at breakfast and lunch,   DAY OF SURGERY: Si su CBG es superior a 220 mg/dL, puede administrarse  dosis de insulina (de correccin).  Pngase en contacto con el doctor(a) que le controla su diabetes para recibir instrucciones especficas antes de la ciruga.  Disminuya las tasas basales en un 20% a la medianoche en la noche anterior a la ciruga.  Tenga en cuenta que si su ciruga est planeada para durar ms de 2 horas, se le retirar la bomba de insulina y se le dar insulina intravenosa (IV), que ser administrada por las enfermeras y Librarian, academic. Podr volver a ponerse la bomba de insulina cuando est despierto(a) y Yancey Flemings.   NO TOME NINGN MEDICAMENTO ORAL PARA LA DIABETES EL DA DE LA CIRUGA  Traiga la mascarilla CPAP y los tubos el da de la [place].                              No debe trae ningn metal en el cuerpo, incluyendo pinzas para el cabello, joyas, ni aretes/pendientes             No use lociones/cremas, polvos, colonias o desodorante              Los hombres pueden afeitarse la cara y el cuello.   No traiga objetos de valor al hospital. Medon NO SE HACE RESPONSABLE DE LOS OBJETOS DE VALOR.   Los contactos, las dentaduras o los puentes no se pueden usar durante la [place].   Elsworth S[place] una bolsa pequea para la noche el da de la [place].   NO TRAIGA AL HOSPITAL LOS MEDICAMENTOS QUE  TOMA EN CASA . LA FARMACIA LE SUMINISTRAR LOS MEDICAMENTOS QUE TENGA EN SU LISTA DE MEDICAMENTOS DURANTE SU ESTADA EN EL HOSPITAL!    Los pacientes dados de alta  el mismo da de la ciruga no podrn Company secretary a casa.  Es NECESARIO que alguien se quede con usted durante las primeras 24 horas despus de la anestesia.   Instrucciones especiales: Dorna Bloom copia de sus documentos de poder notarial y testamento vital el da de su ciruga si no los ha escaneado antes.              Por favor, lea las siguientes hojas informativas que le dieron: SI TIENE PREGUNTAS SOBRE SUS INSTRUCCIONES PREOPERATORIAS POR FAVOR LLAME AL [phone]                           PREPARACIN PARA LA CIRUGA                                            Preparing for Surgery  Debido a que la piel no est esterilizada, sta necesita estar lo ms libre de grmenes como sea posible.  Usted puede reducir el nmero de grmenes en la piel lavndose con el jabn de CHG (Chlorahexidine gluconate) antes de la ciruga.  El CHG es un jabn antisptico el cual mata los grmenes y se une a la piel para continuar matando los grmenes incluso hasta despus de lavarse. POR FAVOR NO LO USE SI USTED TIENE ALERGIAS AL CHG.  SI LA PIEL SE IRRITA, DEJE DE USAR EL CHG.  NO SE RASURE DURANTE AL MENOS 12 HORAS ANTES DE LA PRIMERA DUCHA CON EL CHG. Siga estas instrucciones cuidadosamente:  Dchese la noche anterior a la [place] y de nuevo en la maana de la [place]. Si decide lavarse el cabello, lvelo con su champ normal primero. Enjuague el cabello y el cuerpo para quitarse el [place]. Use el CHG como lo hara con cualquier otro jabn lquido, usando una toallita o esponja vegetal o exfoliante. Aplique el CHG al cuerpo solamente DEL CUELLO PARA ABAJO.  No lo use cerca de los ojos o los genitales. No se lave con su jabn normal despus de usar el CHG. Squese con una toalla limpia. Espere hasta la maana siguiente para aplicarse desodorantes,  lociones, excepto en el da de la [place], NO SE APLIQUE LOCIONES. Use pijamas limpias o una bata. Coloque sbanas limpias en su cama la noche de su primera ducha - no duerma con mascotas. 10.  Use ropa limpia al venir al hospital.         Chales Salmon del paciente (Patient Signature): _________________________________   Franco Nones (Date):  _____________  Earlie Server enfermera (Nurse Signature): _________________________________    Franco Nones (Date):______________

## 2023-01-11 ENCOUNTER — Encounter (HOSPITAL_COMMUNITY): Payer: Self-pay

## 2023-01-11 ENCOUNTER — Other Ambulatory Visit: Payer: Self-pay

## 2023-01-11 ENCOUNTER — Encounter (HOSPITAL_COMMUNITY)
Admission: RE | Admit: 2023-01-11 | Discharge: 2023-01-11 | Disposition: A | Discharge status: Home / Self Care | Payer: Self-pay | Source: Ambulatory Visit | Attending: Urology | Admitting: Urology

## 2023-01-11 VITALS — BP 116/77 | HR 69 | Temp 98.0°F | Resp 16 | Ht 64.0 in | Wt 157.0 lb

## 2023-01-11 DIAGNOSIS — E1165 Type 2 diabetes mellitus with hyperglycemia: Secondary | ICD-10-CM | POA: Insufficient documentation

## 2023-01-11 DIAGNOSIS — Z01812 Encounter for preprocedural laboratory examination: Secondary | ICD-10-CM | POA: Insufficient documentation

## 2023-01-11 LAB — BASIC METABOLIC PANEL
Anion gap: 5 (ref 5–15)
BUN: 20 mg/dL (ref 8–23)
CO2: 20 mmol/L — ABNORMAL LOW (ref 22–32)
Calcium: 9.4 mg/dL (ref 8.9–10.3)
Chloride: 112 mmol/L — ABNORMAL HIGH (ref 98–111)
Creatinine, Ser: 0.89 mg/dL (ref 0.61–1.24)
GFR, Estimated: >60 mL/min (ref >60)
Glucose, Bld: 89 mg/dL (ref 70–99)
Potassium: 4.1 mmol/L (ref 3.5–5.1)
Sodium: 137 mmol/L (ref 135–145)

## 2023-01-11 LAB — CBC
HCT: 56.5 % — ABNORMAL HIGH (ref 39.0–52.0)
Hemoglobin: 18.6 g/dL — ABNORMAL HIGH (ref 13.0–17.0)
MCH: 27.7 pg (ref 26.0–34.0)
MCHC: 32.9 g/dL (ref 30.0–36.0)
MCV: 84.1 fL (ref 80.0–100.0)
Platelets: 226 10*3/uL (ref 150–400)
RBC: 6.72 MIL/uL — ABNORMAL HIGH (ref 4.22–5.81)
RDW: 14.6 % (ref 11.5–15.5)
WBC: 7.3 10*3/uL (ref 4.0–10.5)
nRBC: 0 % (ref 0.0–0.2)

## 2023-01-11 LAB — GLUCOSE, CAPILLARY: Glucose-Capillary: 95 mg/dL (ref 70–99)

## 2023-01-14 ENCOUNTER — Ambulatory Visit (HOSPITAL_BASED_OUTPATIENT_CLINIC_OR_DEPARTMENT_OTHER): Payer: Self-pay | Admitting: Anesthesiology

## 2023-01-14 ENCOUNTER — Encounter (HOSPITAL_COMMUNITY)
Admission: RE | Disposition: A | Discharge status: Home / Self Care | Payer: Self-pay | Source: Home / Self Care | Attending: Urology

## 2023-01-14 ENCOUNTER — Ambulatory Visit (HOSPITAL_COMMUNITY): Payer: Self-pay | Admitting: Anesthesiology

## 2023-01-14 ENCOUNTER — Observation Stay (HOSPITAL_COMMUNITY)
Admission: RE | Admit: 2023-01-14 | Discharge: 2023-01-14 | Disposition: A | Discharge status: Home / Self Care | Payer: Self-pay | Attending: Urology | Admitting: Urology

## 2023-01-14 ENCOUNTER — Other Ambulatory Visit: Payer: Self-pay

## 2023-01-14 ENCOUNTER — Encounter (HOSPITAL_COMMUNITY): Payer: Self-pay | Admitting: Urology

## 2023-01-14 DIAGNOSIS — N401 Enlarged prostate with lower urinary tract symptoms: Secondary | ICD-10-CM

## 2023-01-14 DIAGNOSIS — I10 Essential (primary) hypertension: Secondary | ICD-10-CM | POA: Insufficient documentation

## 2023-01-14 DIAGNOSIS — Z79899 Other long term (current) drug therapy: Secondary | ICD-10-CM | POA: Insufficient documentation

## 2023-01-14 DIAGNOSIS — Z79632 Long term (current) use of antitumor antibiotic: Secondary | ICD-10-CM | POA: Insufficient documentation

## 2023-01-14 DIAGNOSIS — E119 Type 2 diabetes mellitus without complications: Secondary | ICD-10-CM | POA: Insufficient documentation

## 2023-01-14 DIAGNOSIS — R339 Retention of urine, unspecified: Secondary | ICD-10-CM

## 2023-01-14 DIAGNOSIS — E1165 Type 2 diabetes mellitus with hyperglycemia: Principal | ICD-10-CM

## 2023-01-14 DIAGNOSIS — N400 Enlarged prostate without lower urinary tract symptoms: Secondary | ICD-10-CM | POA: Diagnosis present

## 2023-01-14 DIAGNOSIS — Z87891 Personal history of nicotine dependence: Secondary | ICD-10-CM | POA: Insufficient documentation

## 2023-01-14 LAB — TYPE AND SCREEN
ABO/RH(D): A POS
Antibody Screen: NEGATIVE

## 2023-01-14 LAB — ABO/RH: ABO/RH(D): A POS

## 2023-01-14 LAB — GLUCOSE, CAPILLARY
Glucose-Capillary: 190 mg/dL — ABNORMAL HIGH (ref 70–99)
Glucose-Capillary: 86 mg/dL (ref 70–99)
Glucose-Capillary: 108 mg/dL — ABNORMAL HIGH (ref 70–99)
Glucose-Capillary: 161 mg/dL — ABNORMAL HIGH (ref 70–99)
Glucose-Capillary: 116 mg/dL — ABNORMAL HIGH (ref 70–99)
Glucose-Capillary: 345 mg/dL — ABNORMAL HIGH (ref 70–99)

## 2023-01-14 SURGERY — ABLATION, PROSTATE, TRANSURETHRAL, USING WATERJET
Anesthesia: General | Site: Prostate

## 2023-01-14 MED ORDER — TRANEXAMIC ACID 1000 MG/10ML IV SOLN: 1000.0000 mg | Freq: Once | INTRAVENOUS | Status: DC

## 2023-01-14 MED ORDER — SENNOSIDES-DOCUSATE SODIUM 8.6-50 MG PO TABS
2.0000 | ORAL_TABLET | Freq: Every day | ORAL | Status: DC
Start: 2023-01-14 — End: 2023-01-14

## 2023-01-14 MED ORDER — FENTANYL CITRATE (PF) 100 MCG/2ML IJ SOLN
INTRAMUSCULAR | Status: DC | PRN
  Administered 2023-01-14: 100 ug via INTRAVENOUS

## 2023-01-14 MED ORDER — ACETAMINOPHEN 10 MG/ML IV SOLN: 1000.0000 mg | Freq: Once | INTRAVENOUS | Status: DC | PRN

## 2023-01-14 MED ORDER — SODIUM CHLORIDE 0.9 % IR SOLN
3000.0000 mL | Status: DC
  Administered 2023-01-14: 3000 mL

## 2023-01-14 MED ORDER — INSULIN ASPART 100 UNIT/ML IJ SOLN
0.0000 [IU] | INTRAMUSCULAR | Status: DC | PRN
Start: 2023-01-14 — End: 2023-01-14
  Administered 2023-01-14: 4 [IU] via SUBCUTANEOUS
  Filled 2023-01-14: qty 1

## 2023-01-14 MED ORDER — ONDANSETRON HCL 4 MG/2ML IJ SOLN: 4.0000 mg | Freq: Once | INTRAMUSCULAR | Status: DC | PRN

## 2023-01-14 MED ORDER — SODIUM CHLORIDE 0.9% FLUSH
3.0000 mL | Freq: Two times a day (BID) | INTRAVENOUS | Status: DC
  Administered 2023-01-14: 3 mL via INTRAVENOUS

## 2023-01-14 MED ORDER — DIPHENHYDRAMINE HCL 50 MG/ML IJ SOLN: 12.5000 mg | Freq: Four times a day (QID) | INTRAMUSCULAR | Status: DC | PRN

## 2023-01-14 MED ORDER — OXYCODONE HCL 5 MG/5ML PO SOLN: 5.0000 mg | Freq: Once | ORAL | Status: DC | PRN

## 2023-01-14 MED ORDER — ONDANSETRON HCL 4 MG/2ML IJ SOLN
4.0000 mg | INTRAMUSCULAR | Status: DC | PRN
Start: 2023-01-14 — End: 2023-01-14
  Administered 2023-01-14: 4 mg via INTRAVENOUS
  Filled 2023-01-14: qty 2

## 2023-01-14 MED ORDER — INSULIN ASPART 100 UNIT/ML IJ SOLN
0.0000 [IU] | Freq: Three times a day (TID) | INTRAMUSCULAR | Status: DC
  Administered 2023-01-14: 11 [IU] via SUBCUTANEOUS
  Administered 2023-01-14: 3 [IU] via SUBCUTANEOUS

## 2023-01-14 MED ORDER — FENTANYL CITRATE PF 50 MCG/ML IJ SOSY
PREFILLED_SYRINGE | INTRAMUSCULAR | Status: AC
  Administered 2023-01-14: 50 ug via INTRAVENOUS
  Filled 2023-01-14: qty 2

## 2023-01-14 MED ORDER — ONDANSETRON HCL 4 MG/2ML IJ SOLN
INTRAMUSCULAR | Status: AC
  Filled 2023-01-14: qty 2

## 2023-01-14 MED ORDER — SODIUM CHLORIDE 0.9 % IR SOLN
Status: DC | PRN
  Administered 2023-01-14 (×2): 3000 mL
  Administered 2023-01-14: 9000 mL
  Administered 2023-01-14: 3000 mL

## 2023-01-14 MED ORDER — INSULIN ASPART 100 UNIT/ML IJ SOLN
INTRAMUSCULAR | Status: AC
  Filled 2023-01-14: qty 1

## 2023-01-14 MED ORDER — TRANEXAMIC ACID-NACL 1000-0.7 MG/100ML-% IV SOLN
1000.0000 mg | Freq: Once | INTRAVENOUS | Status: AC
  Administered 2023-01-14: 1000 mg via INTRAVENOUS
  Filled 2023-01-14: qty 100

## 2023-01-14 MED ORDER — OXYCODONE HCL 5 MG PO TABS
5.0000 mg | ORAL_TABLET | ORAL | Status: DC | PRN
  Administered 2023-01-14 (×2): 5 mg via ORAL
  Filled 2023-01-14 (×2): qty 1

## 2023-01-14 MED ORDER — ORAL CARE MOUTH RINSE: 15.0000 mL | Freq: Once | OROMUCOSAL | Status: AC

## 2023-01-14 MED ORDER — CEFAZOLIN SODIUM-DEXTROSE 2-4 GM/100ML-% IV SOLN
2.0000 g | INTRAVENOUS | Status: AC
Start: 2023-01-14 — End: 2023-01-14
  Administered 2023-01-14: 2 g via INTRAVENOUS
  Filled 2023-01-14: qty 100

## 2023-01-14 MED ORDER — TRANEXAMIC ACID-NACL 1000-0.7 MG/100ML-% IV SOLN
INTRAVENOUS | Status: AC
  Filled 2023-01-14: qty 100

## 2023-01-14 MED ORDER — ACETAMINOPHEN 325 MG PO TABS
650.0000 mg | ORAL_TABLET | ORAL | Status: DC | PRN
  Administered 2023-01-14: 650 mg via ORAL
  Filled 2023-01-14: qty 2

## 2023-01-14 MED ORDER — ZOLPIDEM TARTRATE 5 MG PO TABS: 5.0000 mg | ORAL_TABLET | Freq: Every evening | ORAL | Status: DC | PRN

## 2023-01-14 MED ORDER — DIPHENHYDRAMINE HCL 12.5 MG/5ML PO ELIX: 12.5000 mg | ORAL_SOLUTION | Freq: Four times a day (QID) | ORAL | Status: DC | PRN

## 2023-01-14 MED ORDER — MIDAZOLAM HCL 5 MG/5ML IJ SOLN
INTRAMUSCULAR | Status: DC | PRN
  Administered 2023-01-14: 2 mg via INTRAVENOUS

## 2023-01-14 MED ORDER — HYDROCODONE-ACETAMINOPHEN 5-325 MG PO TABS
1.0000 | ORAL_TABLET | ORAL | 0 refills | Status: DC | PRN
  Filled 2023-01-14: qty 12, 2d supply, fill #0

## 2023-01-14 MED ORDER — FENTANYL CITRATE PF 50 MCG/ML IJ SOSY
25.0000 ug | PREFILLED_SYRINGE | INTRAMUSCULAR | Status: DC | PRN
  Administered 2023-01-14: 50 ug via INTRAVENOUS

## 2023-01-14 MED ORDER — LIDOCAINE HCL (PF) 2 % IJ SOLN
INTRAMUSCULAR | Status: AC
  Filled 2023-01-14: qty 5

## 2023-01-14 MED ORDER — SUGAMMADEX SODIUM 200 MG/2ML IV SOLN
INTRAVENOUS | Status: DC | PRN
  Administered 2023-01-14: 200 mg via INTRAVENOUS

## 2023-01-14 MED ORDER — ROCURONIUM BROMIDE 10 MG/ML (PF) SYRINGE
PREFILLED_SYRINGE | INTRAVENOUS | Status: AC
  Filled 2023-01-14: qty 10

## 2023-01-14 MED ORDER — CHLORHEXIDINE GLUCONATE 0.12 % MT SOLN
15.0000 mL | Freq: Once | OROMUCOSAL | Status: AC
  Administered 2023-01-14: 15 mL via OROMUCOSAL

## 2023-01-14 MED ORDER — MIDAZOLAM HCL 2 MG/2ML IJ SOLN
INTRAMUSCULAR | Status: AC
  Filled 2023-01-14: qty 2

## 2023-01-14 MED ORDER — LIDOCAINE HCL (CARDIAC) PF 100 MG/5ML IV SOSY
PREFILLED_SYRINGE | INTRAVENOUS | Status: DC | PRN
  Administered 2023-01-14: 100 mg via INTRAVENOUS

## 2023-01-14 MED ORDER — ONDANSETRON HCL 4 MG/2ML IJ SOLN
INTRAMUSCULAR | Status: DC | PRN
  Administered 2023-01-14: 4 mg via INTRAVENOUS

## 2023-01-14 MED ORDER — HYDROMORPHONE HCL 1 MG/ML IJ SOLN: 0.5000 mg | INTRAMUSCULAR | Status: DC | PRN

## 2023-01-14 MED ORDER — CEFAZOLIN SODIUM-DEXTROSE 2-4 GM/100ML-% IV SOLN
2.0000 g | Freq: Three times a day (TID) | INTRAVENOUS | Status: DC
Start: 2023-01-14 — End: 2023-01-14
  Administered 2023-01-14: 2 g via INTRAVENOUS
  Filled 2023-01-14: qty 100

## 2023-01-14 MED ORDER — ATORVASTATIN CALCIUM 40 MG PO TABS
40.0000 mg | ORAL_TABLET | Freq: Every day | ORAL | Status: DC
  Administered 2023-01-14: 40 mg via ORAL
  Filled 2023-01-14: qty 1

## 2023-01-14 MED ORDER — FENTANYL CITRATE (PF) 100 MCG/2ML IJ SOLN
INTRAMUSCULAR | Status: AC
  Filled 2023-01-14: qty 2

## 2023-01-14 MED ORDER — OXYCODONE HCL 5 MG PO TABS: 5.0000 mg | ORAL_TABLET | Freq: Once | ORAL | Status: DC | PRN

## 2023-01-14 MED ORDER — SODIUM CHLORIDE 0.9 % IV SOLN
250.0000 mL | INTRAVENOUS | Status: DC | PRN
Start: 2023-01-15 — End: 2023-01-14

## 2023-01-14 MED ORDER — PROPOFOL 10 MG/ML IV BOLUS
INTRAVENOUS | Status: DC | PRN
  Administered 2023-01-14: 40 mg via INTRAVENOUS
  Administered 2023-01-14: 140 mg via INTRAVENOUS

## 2023-01-14 MED ORDER — GEMFIBROZIL 600 MG PO TABS
600.0000 mg | ORAL_TABLET | Freq: Two times a day (BID) | ORAL | Status: DC
  Administered 2023-01-14: 600 mg via ORAL
  Filled 2023-01-14: qty 1

## 2023-01-14 MED ORDER — ROCURONIUM BROMIDE 100 MG/10ML IV SOLN
INTRAVENOUS | Status: DC | PRN
  Administered 2023-01-14: 80 mg via INTRAVENOUS
  Administered 2023-01-14: 20 mg via INTRAVENOUS

## 2023-01-14 MED ORDER — TRANEXAMIC ACID-NACL 1000-0.7 MG/100ML-% IV SOLN
1000.0000 mg | Freq: Once | INTRAVENOUS | Status: AC
  Administered 2023-01-14: 1000 mg via INTRAVENOUS

## 2023-01-14 MED ORDER — PROPOFOL 1000 MG/100ML IV EMUL
INTRAVENOUS | Status: AC
  Filled 2023-01-14: qty 100

## 2023-01-14 MED ORDER — SODIUM CHLORIDE 0.9% FLUSH
3.0000 mL | INTRAVENOUS | Status: DC | PRN
Start: 2023-01-15 — End: 2023-01-14

## 2023-01-14 MED ORDER — LACTATED RINGERS IV SOLN: INTRAVENOUS | Status: DC | PRN

## 2023-01-14 MED ORDER — OXYBUTYNIN CHLORIDE 5 MG PO TABS: 5.0000 mg | ORAL_TABLET | Freq: Three times a day (TID) | ORAL | Status: DC | PRN

## 2023-01-14 SURGICAL SUPPLY — 28 items
BAG DRN RND TRDRP ANRFLXCHMBR (UROLOGICAL SUPPLIES) ×1
BAG URINE DRAIN 2000ML AR STRL (UROLOGICAL SUPPLIES) ×1 IMPLANT
CANISTER SUCT 3000ML PPV (MISCELLANEOUS) ×1 IMPLANT
CATH HEMA 3WAY 30CC 22FR COUDE (CATHETERS) IMPLANT
CATH HEMA 3WAY 30CC 24FR COUDE (CATHETERS) IMPLANT
COVER MAYO STAND STRL (DRAPES) ×1 IMPLANT
DRAPE FOOT SWITCH (DRAPES) ×1 IMPLANT
DRAPE WARM FLUID 44X44 (DRAPES) IMPLANT
GEL ULTRASOUND 8.5O AQUASONIC (MISCELLANEOUS) ×1 IMPLANT
GLOVE SURG LX STRL 7.5 STRW (GLOVE) ×1 IMPLANT
GOWN STRL REUS W/ TWL XL LVL3 (GOWN DISPOSABLE) ×1 IMPLANT
GOWN STRL REUS W/TWL XL LVL3 (GOWN DISPOSABLE) ×1
HANDPIECE AQUABEAM (MISCELLANEOUS) ×1 IMPLANT
HOLDER FOLEY CATH W/STRAP (MISCELLANEOUS) IMPLANT
KIT TURNOVER KIT A (KITS) IMPLANT
LOOP CUT BIPOLAR 24F LRG (ELECTROSURGICAL) IMPLANT
MANIFOLD NEPTUNE II (INSTRUMENTS) ×1 IMPLANT
MAT ABSORB FLUID 56X50 GRAY (MISCELLANEOUS) ×1 IMPLANT
PACK CYSTO (CUSTOM PROCEDURE TRAY) ×1 IMPLANT
PACK DRAPE AQUABEAM (MISCELLANEOUS) ×1 IMPLANT
PAD PREP 24X48 CUFFED NSTRL (MISCELLANEOUS) ×1 IMPLANT
SYR 30ML LL (SYRINGE) ×1 IMPLANT
SYR TOOMEY IRRIG 70ML (MISCELLANEOUS) ×1
SYRINGE TOOMEY IRRIG 70ML (MISCELLANEOUS) ×2 IMPLANT
TOWEL OR 17X26 10 PK STRL BLUE (TOWEL DISPOSABLE) ×1 IMPLANT
TUBING CONNECTING 10 (TUBING) ×2 IMPLANT
TUBING UROLOGY SET (TUBING) ×1 IMPLANT
UNDERPAD 30X36 HEAVY ABSORB (UNDERPADS AND DIAPERS) ×1 IMPLANT

## 2023-01-14 NOTE — Anesthesia Preprocedure Evaluation (Signed)
Anesthesia Evaluation  Patient identified by MRN, date of birth, ID band Patient awake    Reviewed: Allergy & Precautions, NPO status , Patient's Chart, lab work & pertinent test results, reviewed documented beta blocker date and time   History of Anesthesia Complications Negative for: history of anesthetic complications  Airway Mallampati: II  TM Distance: >3 FB     Dental  (+) Edentulous Upper   Pulmonary neg COPD, Current Smoker and Patient abstained from smoking.   breath sounds clear to auscultation       Cardiovascular hypertension, (-) CAD, (-) Past MI, (-) Cardiac Stents, (-) CABG and (-) CHF  Rhythm:Regular Rate:Normal     Neuro/Psych neg Seizures    GI/Hepatic ,GERD  ,,(+) neg Cirrhosis        Endo/Other  diabetes, Poorly Controlled, Type 2    Renal/GU Renal disease     Musculoskeletal  (+) Arthritis ,    Abdominal   Peds  Hematology   Anesthesia Other Findings   Reproductive/Obstetrics                              Anesthesia Physical Anesthesia Plan  ASA: 2  Anesthesia Plan: General   Post-op Pain Management:    Induction: Intravenous  PONV Risk Score and Plan: 1 and Ondansetron  Airway Management Planned: Oral ETT  Additional Equipment:   Intra-op Plan:   Post-operative Plan: Extubation in OR  Informed Consent: I have reviewed the patients History and Physical, chart, labs and discussed the procedure including the risks, benefits and alternatives for the proposed anesthesia with the patient or authorized representative who has indicated his/her understanding and acceptance.     Dental advisory given and Interpreter used for interveiw  Plan Discussed with: CRNA  Anesthesia Plan Comments:          Anesthesia Quick Evaluation

## 2023-01-14 NOTE — H&P (Addendum)
CC/HPI: 62yM with a history of 140g prostate and prominent median lobe with associated LUTS/urinary retention currently managed with indwelling foley. He desires aquaablation but this procedure has been previously cancelled due to hyperglycemia for which he is currently seeing a clinical pharmacist.   Last seen below:   08/16/2022:  Scheduled for aquablation on 08/10/22 with Dr. Alvester Morin. Cancelled d/t hyperglycemia. Returns today for previously scheduled post-op visit. Has not yet seen PCP re: diabetes management. Catheter last exchanged ~3 weeks ago. No issues with catheter.   09/13/22  Pt was seen by clinic pharmacist on 6/27 and his medications were adjusted for his diabetes. He has plans to follow up in 1 month. He endorses that his sugars are getting better.   Today he reports the catheter is clogged and he noticed some blood in the urine. He also had leakage around the catheter. He noticed it for the last 4 days. He also noticed burning with urination. Not on any blood thinning medication. No fevers or chills at home.   10/11/22  Doing well since the last visit. Catheter is draining well without any issues. Only has pain at the tip of the penis.   He last saw clinical pharmacy on 09/2022 for his diabetes. He has been doing well managing his blood sugars on the current changes they have implemented. Plan to recheck Hg A1C next week.   12/13/22  Today patient is doing well. He has no concerns about his catheter, but did ask questions about preparing for surgery which we reviwed. Most recent A1c 9.6 from 12. Continues to follow with clinical pharmacist for diabetes. Foley exchanged today (26F), urine culture sent in anticipation of his surgery on 01/14/23.   01/14/2023 Patient presents today for aquablation of the prostate.    ALLERGIES: No Known Allergies    MEDICATIONS: Finasteride 5 mg tablet 1 tablet PO Daily  Flomax 0.4 mg capsule 1 capsule PO Daily  Lisinopril 5 mg tablet  Metformin Hcl   Oxybutynin Chloride 5 mg tablet 1 tablet PO TID PRN  Tramadol Hcl 50 mg tablet 1-2 tablet PO Q 6 H PRN  Atorvastatin Calcium  Glipizide  Humulin N 100 unit/ml vial  Levemir 100 unit/ml vial  Oxybutynin Chloride Er 5 mg tablet, extended release 24 hr 1 tablet PO Daily     GU PSH: Cystoscopy - 02/01/2022 Prostate Needle Biopsy - 02/15/2022     NON-GU PSH: Surgical Pathology, Gross And Microscopic Examination For Prostate Needle - 02/15/2022     GU PMH: BPH w/LUTS - 10/11/2022, - 09/13/2022, - 08/16/2022, - 06/14/2022, - 05/29/2022, - 05/10/2022, - 03/29/2022, - 02/15/2022, - 02/01/2022, - 01/11/2022, - 12/28/2021, - 12/21/2021, - 2019, - 2018 Urinary Retention - 10/11/2022, - 07/27/2022, - 06/22/2022, - 06/14/2022, - 05/29/2022, - 02/15/2022, - 02/01/2022 Dysuria - 09/13/2022 Gross hematuria - 09/13/2022, - 06/22/2022 Elevated PSA - 06/14/2022, - 05/10/2022, - 03/29/2022, - 02/15/2022, - 02/01/2022, - 2019, - 2018 Incomplete bladder emptying - 06/14/2022, - 05/10/2022, - 02/27/2022, - 10/19/2021, - 08/24/2021 Weak Urinary Stream - 2019 Nocturia - 2018    NON-GU PMH: Cutaneous abscess of left lower limb - 12/28/2021 Diabetes Type 2 - 12/21/2021 Hypercholesterolemia Hypertension    FAMILY HISTORY: 1 Daughter - Daughter 1 son - Son Diabetes - Mother   SOCIAL HISTORY: Marital Status: Widowed Preferred Language: Spanish; Castilian; Ethnicity: ; Race: Other Race Current Smoking Status: Patient smokes. Has smoked since 12/03/1984. Smokes 1/2 pack per day.   Tobacco Use Assessment Completed: Used Tobacco in last 30  days? Has never drank.  Drinks 1 caffeinated drink per day. 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    REVIEW OF SYSTEMS:    GU Review Male:   Patient denies frequent urination, hard to postpone urination, burning/ pain with urination, get up at night to urinate, leakage of urine, stream starts and stops, trouble starting your stream, have to strain to urinate , erection problems,  and penile pain.  Gastrointestinal (Upper):   Patient denies nausea, vomiting, and indigestion/ heartburn.  Gastrointestinal (Lower):   Patient denies diarrhea and constipation.  Constitutional:   Patient denies fever, night sweats, weight loss, and fatigue.  Skin:   Patient denies skin rash/ lesion and itching.  Eyes:   Patient denies blurred vision and double vision.  Ears/ Nose/ Throat:   Patient denies sore throat and sinus problems.  Hematologic/Lymphatic:   Patient denies swollen glands and easy bruising.  Cardiovascular:   Patient denies leg swelling and chest pains.  Respiratory:   Patient denies cough and shortness of breath.  Endocrine:   Patient denies excessive thirst.  Musculoskeletal:   Patient denies back pain and joint pain.  Neurological:   Patient denies headaches and dizziness.  Psychologic:   Patient denies depression and anxiety.   BP 132/86   Pulse 61   Temp 98.4 F (36.9 C) (Oral)   Resp 16   Ht 5\' 4"  (1.626 m)   Wt 71.2 kg   SpO2 98%   BMI 26.95 kg/m    MULTI-SYSTEM PHYSICAL EXAMINATION:      Notes: Gen: NAD  HEENT: MMM, EOMI  CV: Regular rate  Resp: Regular WOB ORA  GI: Soft, NT, ND  GU: Foley in place with clear yellow urine    ASSESSMENT:      ICD-10 Details  1 GU:   BPH w/LUTS - N40.1   2   Urinary Retention - R33.8   3 NON-GU:   Diabetes Type 2 - E11.9    PLAN:    Plan proceed with aquablation of the prostate.  All questions answered.

## 2023-01-14 NOTE — Op Note (Addendum)
Preoperative diagnosis: BPH with lower urinary tract symptoms, urinary retention  Postoperative diagnosis: Same   Procedure: Robotic water jet ablation of the prostate   Surgeon: Modena Slater, MD  Anesthesia: General   Indication for procedure: 62 year old male with urinary retention presents for the previously mentioned operation.  Findings:  Cystoscopy revealed trilobar hypertrophy with a large intravesical median lobe.  Description of procedure:  He was brought to the operating room and placed supine on the operating table.  After adequate anesthesia he was placed lithotomy position. Timeout was performed to confirm the patient and procedure. The TRUS Stepper was mounted to the Articulating Arm and secured to OR bed. The ultrasound probe was attached to the stepper. Exam under anesthesia was performed and the TRUS was inserted per rectum.  There was no resistance. The ultrasound probe was aligned, and confirmation made that the prostate is centered and aligned using both transverse and sagittal views. The bladder neck, verumontanum and the central/transition zones were identified.  Genitalia were prepped and draped in the usual sterile fashion. The 28F AQUABEAM Handpiece is inserted into the prostatic urethra and a complete cystoscopic evaluation was performed by inspecting the prostate, bladder, and identifying the location of the verumontanum/external sphincter. The AQUABEAM Handpiece was secured to the Handpiece Articulating Arm. Confirmed alignment of AQUABEAM Handpiece and TRUS Probe to be parallel and colinear. Confirmation that AQUABEAM nozzle is centered and anterior of the bladder neck or the median lobe. The cystoscope was then retracted to visualize the verumontanum and external sphincter and the cystoscope tip was positioned just proximal to the external sphincter. Reconfirmed alignment of the TRUS probe with the AQUABEAM Handpiece and compression applied with TRUS probe. Horizontal  alignment of the Handpiece waterjet nozzle was performed. The Aquablation treatment zones were planned utilizing real-time TRUS to visualize the contour of the prostate and the depth and radial angles of resection were defined in the transverse view. In the sagittal view, the AQUABEAM nozzle is identified and position registered with software. The treatment contours were then adjusted to conform to the intended resection margins. The median lobe, bladder neck and verumontanum were marked and confirmed in the treatment contour. The Aquablation Treatment was then started following the resection contour confirmed under ultrasound guidance.  Once Aquablation resection was complete the 24 French aqua beam handpiece was carefully removed.  The continuous-flow sheath with the visual obturator was passed and then the loop and handle.  The trigone and the ureteral orifices were identified.  Resection of some of the residual median lobe and bladder neck tissue was done.  The bladder neck was identified at 6:00 and this was taken up to 12:00 with fulguration of the bladder neck and prostate for hemostasis.  Similarly from 6:00 up to 12:00 on the left side of the bladder neck was identified by resecting some of the ablated tissue to identify the bladder neck and cauterize any bleeding.  Some anterior tissue was resected as well as some median lobe.  This created excellent hemostasis.  All the chips were evacuated.  Ureteral orifices again identified and noted to be normal without injury.  The scope was backed out and a 24 [place] hematuria catheter was placed with 30 cc in the balloon.  The balloon was seated at the bladder neck and it was irrigated on light traction and noted to be clear to pink.  He was hooked up to CBI.  He was cleaned up and placed supine.  Catheter was placed on traction.  He was  awakened and taken to the cover room in stable condition.  Complications: None  Blood loss: 100 mL  Specimens:  None  Drains: 24 [place] three-way hematuria catheter with 30 cc in the balloon  Disposition: Patient stable to PACU

## 2023-01-14 NOTE — Transfer of Care (Signed)
Immediate Anesthesia Transfer of Care Note  Patient: Joseph Roberts  Procedure(s) Performed: TRANSURETHRAL WATERJET ABLATION OF PROSTATE (Prostate)  Patient Location: PACU  Anesthesia Type:General  Level of Consciousness: drowsy and patient cooperative  Airway & Oxygen Therapy: Patient Spontanous Breathing and Patient connected to face mask oxygen  Post-op Assessment: Report given to RN and Post -op Vital signs reviewed and stable  Post vital signs: Reviewed and stable  Last Vitals:  Vitals Value Taken Time  BP    Temp    Pulse 72 01/14/23 0925  Resp 20 01/14/23 0925  SpO2 96 % 01/14/23 0925  Vitals shown include unfiled device data.  Last Pain:  Vitals:   01/14/23 0554  TempSrc:   PainSc: 0-No pain      Patients Stated Pain Goal: 5 (01/14/23 0554)  Complications: No notable events documented.

## 2023-01-14 NOTE — Anesthesia Postprocedure Evaluation (Signed)
Anesthesia Post Note  Patient: Nivaan Fuhr  Procedure(s) Performed: TRANSURETHRAL WATERJET ABLATION OF PROSTATE (Prostate)     Patient location during evaluation: PACU Anesthesia Type: General Level of consciousness: awake and alert Pain management: pain level controlled Vital Signs Assessment: post-procedure vital signs reviewed and stable Respiratory status: spontaneous breathing, nonlabored ventilation, respiratory function stable and patient connected to nasal cannula oxygen Cardiovascular status: blood pressure returned to baseline and stable Postop Assessment: no apparent nausea or vomiting Anesthetic complications: no   No notable events documented.  Last Vitals:  Vitals:   01/14/23 1030 01/14/23 1051  BP: 131/84 131/78  Pulse: (!) 57 (!) 55  Resp: 16 18  Temp: 36.4 C (!) 36.4 C  SpO2: 94% 100%    Last Pain:  Vitals:   01/14/23 1051  TempSrc: Oral  PainSc:                  Mariann Barter

## 2023-01-14 NOTE — Anesthesia Procedure Notes (Signed)
Procedure Name: Intubation Date/Time: 01/14/2023 7:46 AM  Performed by: Chinita Pester, CRNAPre-anesthesia Checklist: Patient identified, Emergency Drugs available, Suction available and Patient being monitored Patient Re-evaluated:Patient Re-evaluated prior to induction Oxygen Delivery Method: Circle System Utilized Preoxygenation: Pre-oxygenation with 100% oxygen Induction Type: IV induction Ventilation: Mask ventilation without difficulty Laryngoscope Size: Mac and 4 Grade View: Grade I Tube type: Oral Tube size: 7.0 mm Number of attempts: 1 Airway Equipment and Method: Stylet and Oral airway Placement Confirmation: ETT inserted through vocal cords under direct vision, positive ETCO2 and breath sounds checked- equal and bilateral Secured at: 22 cm Tube secured with: Tape Dental Injury: Teeth and Oropharynx as per pre-operative assessment

## 2023-01-14 NOTE — Discharge Instructions (Addendum)
Transurethral Resection of the Prostate (TURP) or Greenlight laser ablation of the Prostate  Your postoperative appointment is November 18 At 8:15 AM at [facility] urology specialist  Care After  Refer to this sheet in the next few weeks. These discharge instructions provide you with general information on caring for yourself after you leave the hospital. Your caregiver may also give you specific instructions. Your treatment has been planned according to the most current medical practices available, but unavoidable complications sometimes occur. If you have any problems or questions after discharge, please call your caregiver.  HOME CARE INSTRUCTIONS   Medications You may receive medicine for pain management. As your level of discomfort decreases, adjustments in your pain medicines may be made.  Take all medicines as directed.  You may be given a medicine (antibiotic) to kill germs following surgery. Finish all medicines. Let your caregiver know if you have any side effects or problems from the medicine.  If you are on aspirin, it would be best not to restart the aspirin until the blood in the urine clears Hygiene You can take a shower after surgery.  You should not take a bath while you still have the urethral catheter. Activity You will be encouraged to get out of bed as much as possible and increase your activity level as tolerated.  Spend the first week in and around your home. For 3 weeks, avoid the following:  Straining.  Running.  Strenuous work.  Walks longer than a few blocks.  Riding for extended periods.  Sexual relations.  Do not lift heavy objects (more than 20 pounds) for at least 1 month. When lifting, use your arms instead of your abdominal muscles.  You will be encouraged to walk as tolerated. Do not exert yourself. Increase your activity level slowly. Remember that it is important to keep moving after an operation of any type. This cuts down on the possibility of  developing blood clots.  Your caregiver will tell you when you can resume driving and light housework. Discuss this at your first office visit after discharge. Diet No special diet is ordered after a TURP. However, if you are on a special diet for another medical problem, it should be continued.  Normal fluid intake is usually recommended.  Avoid alcohol and caffeinated drinks for 2 weeks. They irritate the bladder. Decaffeinated drinks are okay.  Avoid spicy foods.  Bladder Function For the first 10 days, empty the bladder whenever you feel a definite desire. Do not try to hold the urine for long periods of time.  Urinating once or twice a night even after you are healed is not uncommon.  You may see some recurrence of blood in the urine after discharge from the hospital. This usually happens within 2 weeks after the procedure.If this occurs, force fluids again as you did in the hospital and reduce your activity.  Bowel Function You may experience some constipation after surgery. This can be minimized by increasing fluids and fiber in your diet. Drink enough water and fluids to keep your urine clear or pale yellow.  A stool softener may be prescribed for use at home. Do not strain to move your bowels.  If you are requiring increased pain medicine, it is important that you take stool softeners to prevent constipation. This will help to promote proper healing by reducing the need to strain to move your bowels.  Sexual Activity Semen movement in the opposite direction and into the bladder (retrograde ejaculation) may occur. Since the  semen passes into the bladder, cloudy urine can occur the first time you urinate after intercourse. Or, you may not have an ejaculation during erection. Ask your caregiver when you can resume sexual activity. Retrograde ejaculation and reduced semen discharge should not reduce one's pleasure of intercourse.  Postoperative Visit Arrange the date and time of your after  surgery visit with your caregiver.  Return to Work After your recovery is complete, you will be able to return to work and resume all activities. Your caregiver will inform you when you can return to work.    Foley Catheter Care A soft, flexible tube (Foley catheter) may have been placed in your bladder to drain urine and fluid. Follow these instructions: Taking Care of the Catheter Keep the area where the catheter leaves your body clean.  Attach the catheter to the leg so there is no tension on the catheter.  Keep the drainage bag below the level of the bladder, but keep it OFF the floor.  Do not take long soaking baths. Your caregiver will give instructions about showering.  Wash your hands before touching ANYTHING related to the catheter or bag.  Using mild soap and warm water on a washcloth:  Clean the area closest to the catheter insertion site using a circular motion around the catheter.  Clean the catheter itself by wiping AWAY from the insertion site for several inches down the tube.  NEVER wipe upward as this could sweep bacteria up into the urethra (tube in your body that normally drains the bladder) and cause infection.  Place a small amount of sterile lubricant at the tip of the penis where the catheter is entering.  Taking Care of the Drainage Bags Two drainage bags may be taken home: a large overnight drainage bag, and a smaller leg bag which fits underneath clothing.  It is okay to wear the overnight bag at any time, but NEVER wear the smaller leg bag at night.  Keep the drainage bag well below the level of your bladder. This prevents backflow of urine into the bladder and allows the urine to drain freely.  Anchor the tubing to your leg to prevent pulling or tension on the catheter. Use tape or a leg strap provided by the hospital.  Empty the drainage bag when it is 1/2 to 3/4 full. Wash your hands before and after touching the bag.  Periodically check the tubing for kinks to  make sure there is no pressure on the tubing which could restrict the flow of urine.  Changing the Drainage Bags Cleanse both ends of the clean bag with alcohol before changing.  Pinch off the rubber catheter to avoid urine spillage during the disconnection.  Disconnect the dirty bag and connect the clean one.  Empty the dirty bag carefully to avoid a urine spill.  Attach the new bag to the leg with tape or a leg strap.  Cleaning the Drainage Bags Whenever a drainage bag is disconnected, it must be cleaned quickly so it is ready for the next use.  Wash the bag in warm, soapy water.  Rinse the bag thoroughly with warm water.  Soak the bag for 30 minutes in a solution of white vinegar and water (1 cup vinegar to 1 quart warm water).  Rinse with warm water.  SEEK MEDICAL CARE IF:  You have chills or night sweats.  You are leaking around your catheter or have problems with your catheter. It is not uncommon to have sporadic leakage around your  catheter as a result of bladder spasms. If the leakage stops, there is not much need for concern. If you are uncertain, call your caregiver.  You develop side effects that you think are coming from your medicines.  SEEK IMMEDIATE MEDICAL CARE IF:  You are suddenly unable to urinate. Check to see if there are any kinks in the drainage tubing that may cause this. If you cannot find any kinks, call your caregiver immediately. This is an emergency.  You develop shortness of breath or chest pains.  Bleeding persists or clots develop in your urine.  You have a fever.  You develop pain in your back or over your lower belly (abdomen).  You develop pain or swelling in your legs.  Any problems you are having get worse rather than better.  MAKE SURE YOU:  Understand these instructions.  Will watch your condition.  Will get help right away if you are not doing well or get worse.

## 2023-01-15 ENCOUNTER — Telehealth: Payer: Self-pay

## 2023-01-15 ENCOUNTER — Other Ambulatory Visit: Payer: Self-pay

## 2023-01-15 LAB — SURGICAL PATHOLOGY

## 2023-01-15 NOTE — Transitions of Care (Post Inpatient/ED Visit) (Signed)
01/15/2023  Name: Joseph Roberts MRN: [id number] DOB: May 23, 1960  Today's TOC FU Call Status: Today's TOC FU Call Status:: Successful TOC FU Call Completed TOC FU Call Complete Date: 01/15/23 Patient's Name and Date of Birth confirmed.  Transition Care Management Follow-up Telephone Call Date of Discharge: 01/14/23 Discharge Facility: [facility]) Type of Discharge: Inpatient Admission Primary Inpatient Discharge Diagnosis:: BPH How have you been since you were released from the hospital?: Same (He said he is in some pain but is " fine.") Any questions or concerns?: Yes Patient Questions/Concerns:: He said that the foley catheter is draining clear urine but he has some bleeding from his penis around the catheter.  I explained to him that he needs to call the urologist with any concerns about the catheter, urine/ bleeding/clots. He stated he was already told that and understands and will call the urologist. Patient Questions/Concerns Addressed: Notified Provider of Patient Questions/Concerns  Items Reviewed: Did you receive and understand the discharge instructions provided?: Yes Medications obtained,verified, and reconciled?: Yes (Medications Reviewed) (He also has a working glucometer but needs to re-order test strips) Any new allergies since your discharge?: No Dietary orders reviewed?: No Do you have support at home?: Yes People in Home: child(ren), adult Name of Support/Comfort Primary Source: his son and daughter in law  Medications Reviewed Today: Medications Reviewed Today     Reviewed by Robyne Peers, RN (Case Manager) on 01/15/23 at 1120  Med List Status: <None>   Medication Order Taking? Sig Documenting Provider Last Dose Status Informant  amoxicillin-clavulanate (AUGMENTIN) 500-125 MG tablet [id number] No Take one tablet by mouth two times daily. Start taking on Nov 4th which is 1 week prior to scheduled surgery  01/13/2023 Active Self  atorvastatin  (LIPITOR) 40 MG tablet [id number] No Take 1 tablet (40 mg total) by mouth daily. Grayce Sessions, NP Past Week Active Self  Blood Glucose Monitoring Suppl (ONE TOUCH ULTRA MINI) W/DEVICE KIT [id number] No 1 each by Does not apply route daily. Test fasting daily [provider] 01/14/2023 Active Self           Med Note Antony Madura, Arn Medal   Thu Mar 01, 2022  4:04 PM)    Dulaglutide (TRULICITY) 3 MG/0.5ML SOPN [id number] No Inject 3 mg as directed once a week. [name] Register, MD 01/04/2023 Active Self           Med Note [name] Dublin   Wed Jan 09, 2023 10:31 AM) ON HOLD  finasteride (PROSCAR) 5 MG tablet [id number] No Take 1 tablet (5 mg total) by mouth daily. Carlus Pavlov, MD Past Month Active Self  gemfibrozil (LOPID) 600 MG tablet [id number] No Take 600 mg by mouth 2 (two) times daily before a meal. [provider] Past Week Active Self  glucose blood (TRUE METRIX BLOOD GLUCOSE TEST) test strip [id number] No Use as instructed. Check blood glucose level by fingerstick three times per day. Grayce Sessions, NP 01/14/2023 Active Self  HYDROcodone-acetaminophen (NORCO) 5-325 MG tablet [id number]  Take 1 tablet by mouth every 4 (four) hours as needed. Crista Elliot, MD  Active   Insulin Glargine [facility]) 100 UNIT/ML [id number] No Inject 48 Units into the skin daily. [name] Register, MD Past Week Active Self  insulin lispro (HUMALOG KWIKPEN) 100 UNIT/ML KwikPen [id number] No Inject 10 Units into the skin 2 (two) times daily with a meal. [name] Register, MD 01/13/2023 Active Self  Insulin Pen Needle 31G X 5 MM MISC [id number] No use as  directed to inject insulin 4 times daily Grayce Sessions, NP 01/13/2023 Active Self  Insulin Syringe-Needle U-100 (TRUEPLUS INSULIN SYRINGE) 31G X 5/16" 0.3 ML MISC [id number] No Use to inject Humalog three times daily. Grayce Sessions, NP 01/13/2023 Active Self  lisinopril (ZESTRIL) 5 MG tablet [id number] No Take 1 tablet (5 mg total)  by mouth daily.  Patient not taking: Reported on 01/09/2023   Grayce Sessions, NP 01/07/2023 Active Self  metFORMIN (GLUCOPHAGE-XR) 500 MG 24 hr tablet [id number] No Take 2 tablets (1,000 mg total) by mouth 2 (two) times daily. [name] Register, MD Past Week Active Self  neomycin-bacitracin-polymyxin (NEOSPORIN) OINT [id number] No Apply 1 Application topically as needed for wound care. [provider] Past Week Active Self  oxybutynin (DITROPAN) 5 MG tablet [id number] No Take 1 tablet (5 mg total) by mouth 3 (three) times daily as needed.  Patient not taking: Reported on 01/09/2023   Jillyn Ledger, MD Not Taking Active Self  tamsulosin (FLOMAX) 0.4 MG CAPS capsule [id number] No Take 1 capsule (0.4 mg total) by mouth daily. Carlus Pavlov, MD Past Month Active Self  TRUEplus Lancets 28G MISC [id number] No Use as instructed. Check blood glucose level by fingerstick three times per day. Grayce Sessions, NP 01/14/2023 Active Self            Home Care and Equipment/Supplies: Were Home Health Services Ordered?: No Any new equipment or medical supplies ordered?: No  Functional Questionnaire: Do you need assistance with bathing/showering or dressing?: No Do you need assistance with meal preparation?: No Do you need assistance with eating?: No Do you have difficulty maintaining continence: Yes (currenlty has a foley catheter) Do you need assistance with getting out of bed/getting out of a chair/moving?: No Do you have difficulty managing or taking your medications?: Yes (He stated his son and daughter in law help with med management)  Follow up appointments reviewed: PCP Follow-up appointment confirmed?: Yes Date of PCP follow-up appointment?: 01/16/23 Follow-up Provider: Gwinda Passe, NP Specialist Hospital Follow-up appointment confirmed?: Yes Date of Specialist follow-up appointment?: 01/21/23 Follow-Up Specialty Provider:: urology.  he needs to call to confirm the  date/time Do you need transportation to your follow-up appointment?: No Do you understand care options if your condition(s) worsen?: Yes-patient verbalized understanding    SIGNATURE Robyne Peers, RN

## 2023-01-15 NOTE — Discharge Summary (Signed)
Physician Discharge Summary  Patient ID: Joseph Roberts MRN: [id number] DOB/AGE: [birth year] 62 y.o.  Admit date: 01/14/2023 Discharge date: 01/15/2023  Admission Diagnoses:  Discharge Diagnoses:  Principal Problem:   BPH (benign prostatic hyperplasia)   Discharged Condition: good  Hospital Course: Patient underwent aquablation.  Remained for several hours for observation.  Was weaned off CBI and able to be discharged same day.  Consults: None  Significant Diagnostic Studies: None  Treatments: surgery: As above  Discharge Exam: Blood pressure (!) 143/70, pulse 85, temperature 98 F (36.7 C), temperature source Oral, resp. rate 20, height 5\' 4"  (1.626 m), weight 71.2 kg, SpO2 97%. General appearance: alert no acute distress Adequate perfusion of extremities Nonlabored respiration Abdomen soft nontender nondistended Three-way Foley catheter in place draining light pink urine off CBI  Disposition: Discharge disposition: 01-Home or Self Care        Allergies as of 01/14/2023   No Known Allergies      Medication List     TAKE these medications    amoxicillin-clavulanate 500-125 MG tablet Commonly known as: Augmentin Take one tablet by mouth two times daily. Start taking on Nov 4th which is 1 week prior to scheduled surgery   atorvastatin 40 MG tablet Commonly known as: LIPITOR Tome 1 tableta (40 mg en total) por va oral diariamente. (Take 1 tablet (40 mg total) by mouth daily.)   Basaglar KwikPen 100 UNIT/ML Inject 48 Units into the skin daily.   finasteride 5 MG tablet Commonly known as: PROSCAR Tome 1 tableta (5 mg en total) por va oral diariamente. (Take 1 tablet (5 mg total) by mouth daily.)   gemfibrozil 600 MG tablet Commonly known as: LOPID Take 600 mg by mouth 2 (two) times daily before a meal.   HumaLOG KwikPen 100 UNIT/ML KwikPen Generic drug: insulin lispro Inject 10 Units into the skin 2 (two) times daily with a meal.    HYDROcodone-acetaminophen 5-325 MG tablet Commonly known as: Norco Take 1 tablet by mouth every 4 (four) hours as needed.   lisinopril 5 MG tablet Commonly known as: ZESTRIL Tome 1 tableta (5 mg en total) por va oral diariamente. (Take 1 tablet (5 mg total) by mouth daily.)   metFORMIN 500 MG 24 hr tablet Commonly known as: GLUCOPHAGE-XR Take 2 tablets (1,000 mg total) by mouth 2 (two) times daily.   neomycin-bacitracin-polymyxin Oint Commonly known as: NEOSPORIN Apply 1 Application topically as needed for wound care.   ONE TOUCH ULTRA MINI w/Device Kit 1 each by Does not apply route daily. Test fasting daily   oxybutynin 5 MG tablet Commonly known as: DITROPAN Take 1 tablet (5 mg total) by mouth 3 (three) times daily as needed.   tamsulosin 0.4 MG Caps capsule Commonly known as: FLOMAX Tome por va oral. (Take 1 capsule (0.4 mg total) by mouth daily.)   TechLite Pen Needles 31G X 5 MM Misc Generic drug: Insulin Pen Needle use as directed to inject insulin 4 times daily   True Metrix Blood Glucose Test test strip Generic drug: glucose blood Use as instructed. Check blood glucose level by fingerstick three times per day.   TRUEplus Insulin Syringe 31G X 5/16" 0.3 ML Misc Generic drug: Insulin Syringe-Needle U-100 Use to inject Humalog three times daily.   TRUEplus Lancets 28G Misc Use as instructed. Check blood glucose level by fingerstick three times per day.   Trulicity 3 MG/0.5ML Soaj Generic drug: Dulaglutide Inject 3 mg as directed once a week.  Signed: Ray Church, III 01/15/2023, 7:41 AM

## 2023-01-15 NOTE — Telephone Encounter (Signed)
Joseph Roberts, he has an appt with you tomorrow- 11/13  He said that the foley catheter is draining clear urine but he has some bleeding from his penis around the catheter. I explained to him that he needs to call the urologist with any concerns about the catheter, urine/ bleeding/clots. He stated he was already told that and understands and will call the urologist.    Regarding medications he does not have the following: finasteride, gemfibrozil, oxybutynin or tamsulosin.

## 2023-01-16 ENCOUNTER — Encounter (INDEPENDENT_AMBULATORY_CARE_PROVIDER_SITE_OTHER): Payer: Self-pay | Admitting: Primary Care

## 2023-01-16 ENCOUNTER — Ambulatory Visit (INDEPENDENT_AMBULATORY_CARE_PROVIDER_SITE_OTHER): Payer: Self-pay | Admitting: Primary Care

## 2023-01-16 VITALS — BP 138/86 | HR 89 | Resp 16 | Wt 162.2 lb

## 2023-01-16 DIAGNOSIS — Z09 Encounter for follow-up examination after completed treatment for conditions other than malignant neoplasm: Secondary | ICD-10-CM

## 2023-01-16 DIAGNOSIS — Z7985 Long-term (current) use of injectable non-insulin antidiabetic drugs: Secondary | ICD-10-CM

## 2023-01-16 DIAGNOSIS — N401 Enlarged prostate with lower urinary tract symptoms: Secondary | ICD-10-CM

## 2023-01-16 DIAGNOSIS — E1165 Type 2 diabetes mellitus with hyperglycemia: Secondary | ICD-10-CM

## 2023-01-16 DIAGNOSIS — Z7984 Long term (current) use of oral hypoglycemic drugs: Secondary | ICD-10-CM

## 2023-01-16 DIAGNOSIS — Z794 Long term (current) use of insulin: Secondary | ICD-10-CM

## 2023-01-16 NOTE — Patient Instructions (Addendum)

## 2023-01-16 NOTE — Progress Notes (Signed)
[facility]   Subjective:   [name] is a 62 y.o. Hispanic male  (interpreter Ignacia Bayley [phone]) presents for follow up. Admit date to the hospital was 01/14/23, patient was discharged from the hospital on 01/14/23, patient was admitted for: Benign prostatic hyperplasia discharged on antibiotics, statin, glucose meter, Trulicity 3 mg weekly and Procar 5 mg daily, which and oxybutynin.  Urologist Dr. Modena Slater.  Today patient voices urine and blood was leaking from the catheter from around the penis.  Explained to patient needed to follow-up and call urology for follow-up appointment.  Also placed a call to [facility] for the same.     Current Outpatient Medications on File Prior to Visit  Medication Sig Dispense Refill   amoxicillin-clavulanate (AUGMENTIN) 500-125 MG tablet Take one tablet by mouth two times daily. Start taking on Nov 4th which is 1 week prior to scheduled surgery 14 tablet 0   atorvastatin (LIPITOR) 40 MG tablet Take 1 tablet (40 mg total) by mouth daily. 90 tablet 1   Blood Glucose Monitoring Suppl (ONE TOUCH ULTRA MINI) W/DEVICE KIT 1 each by Does not apply route daily. Test fasting daily     Dulaglutide (TRULICITY) 3 MG/0.5ML SOPN Inject 3 mg as directed once a week. 6 mL 1   finasteride (PROSCAR) 5 MG tablet Take 1 tablet (5 mg total) by mouth daily. 90 tablet 3   gemfibrozil (LOPID) 600 MG tablet Take 600 mg by mouth 2 (two) times daily before a meal.     glucose blood (TRUE METRIX BLOOD GLUCOSE TEST) test strip Use as instructed. Check blood glucose level by fingerstick three times per day. 100 each 12   HYDROcodone-acetaminophen (NORCO) 5-325 MG tablet Take 1 tablet by mouth every 4 (four) hours as needed. 12 tablet 0   Insulin Glargine (BASAGLAR KWIKPEN) 100 UNIT/ML Inject 48 Units into the skin daily. 45 mL 1   insulin lispro (HUMALOG KWIKPEN) 100 UNIT/ML KwikPen Inject 10 Units into the skin 2 (two) times daily with a meal. 18 mL 1    Insulin Pen Needle 31G X 5 MM MISC use as directed to inject insulin 4 times daily 200 each 3   Insulin Syringe-Needle U-100 (TRUEPLUS INSULIN SYRINGE) 31G X 5/16" 0.3 ML MISC Use to inject Humalog three times daily. 100 each 2   lisinopril (ZESTRIL) 5 MG tablet Take 1 tablet (5 mg total) by mouth daily. (Patient not taking: Reported on 01/09/2023) 90 tablet 1   metFORMIN (GLUCOPHAGE-XR) 500 MG 24 hr tablet Take 2 tablets (1,000 mg total) by mouth 2 (two) times daily. 120 tablet 2   neomycin-bacitracin-polymyxin (NEOSPORIN) OINT Apply 1 Application topically as needed for wound care.     oxybutynin (DITROPAN) 5 MG tablet Take 1 tablet (5 mg total) by mouth 3 (three) times daily as needed. (Patient not taking: Reported on 01/09/2023) 30 tablet 2   tamsulosin (FLOMAX) 0.4 MG CAPS capsule Take 1 capsule (0.4 mg total) by mouth daily. 90 capsule 3   TRUEplus Lancets 28G MISC Use as instructed. Check blood glucose level by fingerstick three times per day. 100 each 3   No current facility-administered medications on file prior to visit.     Review of System: Comprehensive ROS Pertinent positive and negative noted in HPI    Objective:  BP 138/86   Pulse 89   Resp 16   Wt 162 lb 3.2 oz (73.6 kg)   SpO2 100%   BMI 27.84 kg/m   [employer]  01/16/23 1005  Weight: 162 lb 3.2 oz (73.6 kg)    Physical Exam General Appearance: Well nourished, in no apparent distress. Eyes: PERRLA, EOMs, conjunctiva no swelling or erythema Sinuses: No Frontal/maxillary tenderness ENT/Mouth: Ext aud canals clear, TMs without erythema, bulging. No erythema, swelling, or exudate on post pharynx.  Tonsils not swollen or erythematous. Hearing normal.  Neck: Supple, thyroid normal.  Respiratory: Respiratory effort normal, BS equal bilaterally without rales, rhonchi, wheezing or stridor.  Cardio: RRR with no MRGs. Brisk peripheral pulses without edema.  Abdomen: Soft, + BS.  Non tender, no guarding, rebound,  hernias, masses.  Foley catheter in place urine at appointment time was yellow. Lymphatics: Non tender without lymphadenopathy.  Musculoskeletal: Full ROM, 5/5 strength, normal gait.  Skin: Warm, dry without rashes, lesions, ecchymosis.  Neuro: Cranial nerves intact. Normal muscle tone, no cerebellar symptoms. Sensation intact.  Psych: Awake and oriented X 3, normal affect, Insight and Judgment appropriate.    Assessment:  [name] was seen today for hospitalization follow-up.  Diagnoses and all orders for this visit:  Uncontrolled type 2 diabetes mellitus with hyperglycemia (HCC)  Complications from uncontrolled diabetes -diabetic retinopathy leading to blindness, diabetic nephropathy leading to dialysis, decrease in circulation decrease in sores or wound healing which may lead to amputations and increase of heart attack and stroke  Follow-up by clinical pharmacist last office visit 01/03/2023 medications adjustment at that time  Benign prostatic hyperplasia with lower urinary tract symptoms, symptom details unspecified Surgery TRANSURETHRAL WATERJET ABLATION OF PROSTATE   Follow-up with urology  This note has been created with Dragon speech recognition software and Paediatric nurse. Any transcriptional errors are unintentional.   Grayce Sessions, NP 01/16/2023, 10:06 AM

## 2023-01-16 NOTE — Telephone Encounter (Signed)
noted 

## 2023-01-17 ENCOUNTER — Other Ambulatory Visit: Payer: Self-pay

## 2023-01-24 ENCOUNTER — Ambulatory Visit: Payer: Self-pay | Admitting: Pharmacist

## 2023-01-29 ENCOUNTER — Other Ambulatory Visit: Payer: Self-pay

## 2023-02-15 ENCOUNTER — Encounter: Payer: Self-pay | Admitting: Pharmacist

## 2023-02-15 ENCOUNTER — Other Ambulatory Visit: Payer: Self-pay

## 2023-02-15 ENCOUNTER — Ambulatory Visit: Payer: Self-pay | Attending: Internal Medicine | Admitting: Pharmacist

## 2023-02-15 DIAGNOSIS — Z7984 Long term (current) use of oral hypoglycemic drugs: Secondary | ICD-10-CM

## 2023-02-15 DIAGNOSIS — Z7985 Long-term (current) use of injectable non-insulin antidiabetic drugs: Secondary | ICD-10-CM

## 2023-02-15 DIAGNOSIS — Z760 Encounter for issue of repeat prescription: Secondary | ICD-10-CM

## 2023-02-15 DIAGNOSIS — Z794 Long term (current) use of insulin: Secondary | ICD-10-CM

## 2023-02-15 DIAGNOSIS — E1165 Type 2 diabetes mellitus with hyperglycemia: Secondary | ICD-10-CM

## 2023-02-15 MED ORDER — TRULICITY 0.75 MG/0.5ML ~~LOC~~ SOAJ
0.7500 mg | SUBCUTANEOUS | 1 refills | Status: DC
  Filled 2023-02-15: qty 2, 28d supply, fill #0
  Filled 2023-03-19: qty 2, 28d supply, fill #1

## 2023-02-15 MED ORDER — METFORMIN HCL ER 500 MG PO TB24
1000.0000 mg | ORAL_TABLET | Freq: Two times a day (BID) | ORAL | 3 refills | Status: DC
  Filled 2023-02-15: qty 120, 30d supply, fill #0
  Filled 2023-03-29: qty 120, 30d supply, fill #1
  Filled 2023-04-25: qty 120, 30d supply, fill #2
  Filled 2023-05-28: qty 120, 30d supply, fill #3

## 2023-02-15 NOTE — Progress Notes (Signed)
S:     62 y.o. male who presents for diabetes evaluation, education, and management. PMH is significant for BPH, diabetes mellitus (2011), HLD, and HTN.   Patient was last seen by Marcelino Duster on 01/16/2023 and last seen by pharmacist on 01/03/2023. At that visit with me in October, pt's A1c was found to be 10.6%. We backed off of his basal dose and increased bolus insulin to provide more mealtime coverage. Since that visit, patient underwent aquablation for his BPH 01/14/2023. Saw Marcelino Duster 01/16/2023 for sugery follow-up. DM medications were not adjusted at that time.   Patient arrives in good spirits and Spanish Interpreter [phone], Edelin) used. Today, he reports doing okay. He is still having some discomfort S/p aquablation and plans to follow-up with his Urologist concerning this. Unfortunately, he endorses medication non-adherence - I have summarized his last fill dates below. He does appear to have suboptimal adherence.   Family/Social History:  - FHx: diabetes (mother, brother, sister) - Tobacco Use: Current -15 pack-year smoking history   Current diabetes medications include:  - Basaglar 40 units daily (last picked up a 30-day supply 01/12/2023)  - Humalog 10 units BID (last picked-up a 30-ds 01/09/23) - Trulicity 3 mg weekly (last picked-up 12/07/2022 for a 28-day supply) - Metformin 1000 mg XR BID - takes two 500 mg tabs BID (last picked-up a 30-DS 11/20/2022)  Insurance coverage: Self-Pay  Patient denies hypoglycemic events.  Reported home fasting blood sugars: "they are high" - tells me he sees mostly 200s.  Patient endorses polyuria and discomfort related to his surgery. Patient denies neuropathy (nerve pain). No changes in neuropathy but some pain in L leg.  Patient denies visual changes. Patient reports self foot exams.   Patient reported dietary habits: - Reports cutting back on carbs and tortillas; eats ~ 2 tortillas a day - No changes in diet  - Usually only eats 1  meal daily at lunch    Patient-reported exercise habits:  - Walks 10-20 minutes a day at the park - Reports he is still walking daily   O:   Lab Results  Component Value Date   HGBA1C 10.6 (A) 01/03/2023   There were no vitals filed for this visit.  Lipid Panel     Component Value Date/Time   CHOL 108 07/16/2022 0925   TRIG 227 (H) 07/16/2022 0925   HDL 36 (L) 07/16/2022 0925   CHOLHDL 3.0 07/16/2022 0925   CHOLHDL 4.7 03/07/2016 1046   VLDL 61 (H) 03/07/2016 1046   LDLCALC 36 07/16/2022 0925    Clinical Atherosclerotic Cardiovascular Disease (ASCVD): No  The ASCVD Risk score (Arnett DK, et al., 2019) failed to calculate for the following reasons:   The valid total cholesterol range is 130 to 320 mg/dL   Patient is participating in a Managed Medicaid Plan: No   A/P: Diabetes longstanding, currently uncontrolled. He is not hypoglycemic and is able to verbalize appropriate hypoglycemia management plan. Medication non-adherence is our main barrier here. I believe he prioritized his procedure at the beginning of November and lost track of his DM. He is amenable to restarting his medication. We will restart at the lower dose of Trulicity since he hasn't taken it in at least a month.  -Restart Basaglar 40 units at night -Restart Humalog 10 units BID before breakfast and lunch (he does not eat dinner).  -Restart Trulicity 0.75 mg weekly (on Fridays). -Restart metformin 1000 mg XR BID (takes two 500 mg tabs BID) -Patient educated on purpose,  proper use, and potential adverse effects of medications.  -Extensively discussed pathophysiology of diabetes, recommended lifestyle interventions, dietary effects on blood sugar control.  -Counseled on s/sx of and management of hypoglycemia.  -A1c anticipated 03/2023.  Written patient instructions provided. Patient verbalized understanding of treatment plan.  Total time in face to face counseling 30 minutes.    Follow-up:  PCP clinic visit  on 04/22/23. Me: 03/29/2023  Butch Penny, PharmD, BCACP, CPP Clinical Pharmacist [facility] & [facility] [phone]

## 2023-02-20 ENCOUNTER — Other Ambulatory Visit: Payer: Self-pay

## 2023-02-20 MED ORDER — AMOXICILLIN-POT CLAVULANATE 500-125 MG PO TABS
1.0000 | ORAL_TABLET | Freq: Two times a day (BID) | ORAL | 0 refills | Status: DC
  Filled 2023-02-20: qty 42, 21d supply, fill #0

## 2023-02-21 ENCOUNTER — Other Ambulatory Visit: Payer: Self-pay

## 2023-02-22 ENCOUNTER — Other Ambulatory Visit: Payer: Self-pay

## 2023-02-22 MED ORDER — NITROFURANTOIN MONOHYD MACRO 100 MG PO CAPS
100.0000 mg | ORAL_CAPSULE | Freq: Two times a day (BID) | ORAL | 0 refills | Status: DC
  Filled 2023-02-22: qty 60, 30d supply, fill #0

## 2023-03-04 ENCOUNTER — Other Ambulatory Visit: Payer: Self-pay

## 2023-03-19 ENCOUNTER — Other Ambulatory Visit: Payer: Self-pay

## 2023-03-29 ENCOUNTER — Ambulatory Visit: Payer: Self-pay | Attending: Primary Care | Admitting: Pharmacist

## 2023-03-29 ENCOUNTER — Encounter: Payer: Self-pay | Admitting: Pharmacist

## 2023-03-29 ENCOUNTER — Other Ambulatory Visit: Payer: Self-pay

## 2023-03-29 DIAGNOSIS — Z7984 Long term (current) use of oral hypoglycemic drugs: Secondary | ICD-10-CM

## 2023-03-29 DIAGNOSIS — E1165 Type 2 diabetes mellitus with hyperglycemia: Secondary | ICD-10-CM

## 2023-03-29 DIAGNOSIS — Z794 Long term (current) use of insulin: Secondary | ICD-10-CM

## 2023-03-29 DIAGNOSIS — Z7985 Long-term (current) use of injectable non-insulin antidiabetic drugs: Secondary | ICD-10-CM

## 2023-03-29 LAB — POCT GLYCOSYLATED HEMOGLOBIN (HGB A1C): HbA1c, POC (controlled diabetic range): 13 % — AB (ref 0.0–7.0)

## 2023-03-29 MED ORDER — INSULIN LISPRO (1 UNIT DIAL) 100 UNIT/ML (KWIKPEN)
14.0000 [IU] | PEN_INJECTOR | Freq: Two times a day (BID) | SUBCUTANEOUS | 1 refills | Status: DC
  Filled 2023-03-29: qty 9, 32d supply, fill #0
  Filled 2023-05-28: qty 9, 32d supply, fill #1

## 2023-03-29 MED ORDER — TRULICITY 1.5 MG/0.5ML ~~LOC~~ SOAJ
1.5000 mg | SUBCUTANEOUS | 1 refills | Status: DC
  Filled 2023-03-29: qty 2, 28d supply, fill #0
  Filled 2023-05-28: qty 2, 28d supply, fill #1

## 2023-03-29 NOTE — Progress Notes (Signed)
S:     63 y.o. male who presents for diabetes evaluation, education, and management. PMH is significant for BPH, diabetes mellitus (2011), HLD, and HTN.   Patient was last seen by Marcelino Duster on 01/16/2023 and last seen by pharmacist on 02/15/2023. At that visit with me, pt was found to be non-adherent. We restarted Trulicity at the 0.75 mg dose. Additionally, we resumed metformin, Basaglar, and Humalog.  Patient arrives in good spirits and Spanish Interpreter [phone], Tania) used. Today, he reports doing okay. Unfortunately, we discovered last month that he had not been adherent to his medications. We had to restart Trulicity and resume his other medications. Today, he reports a better job with adherence since seeing me last month. However, because of poor adherence his A1c continues to worsen.  Family/Social History:  - FHx: diabetes (mother, brother, sister) - Tobacco Use: Current -15 pack-year smoking history   Current diabetes medications include:  - Basaglar 40 units daily - Humalog 10 units BID  - Trulicity 0.75 mg weekly (takes on Friday in the evening) - Metformin 1000 mg XR BID - takes two 500 mg tabs BID  Insurance coverage: Self-Pay  Patient denies hypoglycemic events.  Reported home fasting blood sugars:  -Since last visit with me, blood sugars are 160 - 180.  -Post-prandial/random: 240  Patient endorses polyuria and discomfort related to his surgery. Patient denies neuropathy (nerve pain). No changes in neuropathy but some pain in L leg.  Patient denies visual changes. Patient reports self foot exams.   Patient reported dietary habits: - Reports cutting back on carbs and tortillas; eats ~ 2 tortillas a day - No changes in diet  - Usually only eats 1 meal daily at lunch    Patient-reported exercise habits:  - Walks 10-20 minutes a day at the park - Reports he is still walking daily   O:   Lab Results  Component Value Date   HGBA1C 13.0 (A) 03/29/2023    There were no vitals filed for this visit.  Lipid Panel     Component Value Date/Time   CHOL 108 07/16/2022 0925   TRIG 227 (H) 07/16/2022 0925   HDL 36 (L) 07/16/2022 0925   CHOLHDL 3.0 07/16/2022 0925   CHOLHDL 4.7 03/07/2016 1046   VLDL 61 (H) 03/07/2016 1046   LDLCALC 36 07/16/2022 0925    Clinical Atherosclerotic Cardiovascular Disease (ASCVD): No  The ASCVD Risk score (Arnett DK, et al., 2019) failed to calculate for the following reasons:   The valid total cholesterol range is 130 to 320 mg/dL   Patient is participating in a Managed Medicaid Plan: No   A/P: Diabetes longstanding, currently uncontrolled. A1c today is elevated and trending up. This is morso due to poor adherence in November and October. He has done a better job since seeing me in December, and his numbers reflect this. However, we need to continue to titrate medication doses. He is not hypoglycemic and is able to verbalize appropriate hypoglycemia management plan. -Continue Basaglar 40 units at night -Increase Humalog to 14 units BID before breakfast and lunch (he does not eat dinner).  -Increase Trulicity to 1.5 mg weekly (on Fridays). -Continue metformin 1000 mg XR BID (takes two 500 mg tabs BID) -Patient educated on purpose, proper use, and potential adverse effects of medications.  -Extensively discussed pathophysiology of diabetes, recommended lifestyle interventions, dietary effects on blood sugar control.  -Counseled on s/sx of and management of hypoglycemia.  -A1c anticipated 06/2023.  Written patient instructions  provided. Patient verbalized understanding of treatment plan.  Total time in face to face counseling 30 minutes.    Follow-up:  PCP clinic visit on 04/22/23. Me: 05/03/23  Butch Penny, PharmD, BCACP, CPP Clinical Pharmacist [facility] & [facility] [phone]

## 2023-04-22 ENCOUNTER — Other Ambulatory Visit (INDEPENDENT_AMBULATORY_CARE_PROVIDER_SITE_OTHER): Payer: Self-pay

## 2023-04-22 DIAGNOSIS — Z2821 Immunization not carried out because of patient refusal: Secondary | ICD-10-CM

## 2023-04-22 DIAGNOSIS — Z794 Long term (current) use of insulin: Secondary | ICD-10-CM

## 2023-04-22 DIAGNOSIS — Z7985 Long-term (current) use of injectable non-insulin antidiabetic drugs: Secondary | ICD-10-CM

## 2023-04-22 DIAGNOSIS — E1169 Type 2 diabetes mellitus with other specified complication: Secondary | ICD-10-CM

## 2023-04-22 DIAGNOSIS — E1165 Type 2 diabetes mellitus with hyperglycemia: Secondary | ICD-10-CM

## 2023-04-22 DIAGNOSIS — I10 Essential (primary) hypertension: Secondary | ICD-10-CM

## 2023-04-22 NOTE — Progress Notes (Signed)
Pt came into the office today for 3 month f/u per provider pt just saw [facility] and had A1C done. Pt will need lab work and 3 month from last A1C

## 2023-04-23 LAB — CBC WITH DIFFERENTIAL/PLATELET
Basophils Absolute: 0.1 10*3/uL (ref 0.0–0.2)
Basos: 1 %
EOS (ABSOLUTE): 0.3 10*3/uL (ref 0.0–0.4)
Eos: 3 %
Hematocrit: 46.5 % (ref 37.5–51.0)
Hemoglobin: 15 g/dL (ref 13.0–17.7)
Immature Grans (Abs): 0 10*3/uL (ref 0.0–0.1)
Immature Granulocytes: 0 %
Lymphocytes Absolute: 2.6 10*3/uL (ref 0.7–3.1)
Lymphs: 29 %
MCH: 27.3 pg (ref 26.6–33.0)
MCHC: 32.3 g/dL (ref 31.5–35.7)
MCV: 85 fL (ref 79–97)
Monocytes Absolute: 0.7 10*3/uL (ref 0.1–0.9)
Monocytes: 8 %
Neutrophils Absolute: 5.3 10*3/uL (ref 1.4–7.0)
Neutrophils: 59 %
Platelets: 298 10*3/uL (ref 150–450)
RBC: 5.5 x10E6/uL (ref 4.14–5.80)
RDW: 14.5 % (ref 11.6–15.4)
WBC: 8.9 10*3/uL (ref 3.4–10.8)

## 2023-04-23 LAB — CMP14+EGFR
ALT: 9 [IU]/L (ref 0–44)
AST: 14 [IU]/L (ref 0–40)
Albumin: 3.8 g/dL — ABNORMAL LOW (ref 3.9–4.9)
Alkaline Phosphatase: 165 [IU]/L — ABNORMAL HIGH (ref 44–121)
BUN/Creatinine Ratio: 14 (ref 10–24)
BUN: 15 mg/dL (ref 8–27)
Bilirubin Total: 0.3 mg/dL (ref 0.0–1.2)
CO2: 18 mmol/L — ABNORMAL LOW (ref 20–29)
Calcium: 9.8 mg/dL (ref 8.6–10.2)
Chloride: 105 mmol/L (ref 96–106)
Creatinine, Ser: 1.09 mg/dL (ref 0.76–1.27)
Globulin, Total: 3.4 g/dL (ref 1.5–4.5)
Glucose: 270 mg/dL — ABNORMAL HIGH (ref 70–99)
Potassium: 4.4 mmol/L (ref 3.5–5.2)
Sodium: 139 mmol/L (ref 134–144)
Total Protein: 7.2 g/dL (ref 6.0–8.5)
eGFR: 77 mL/min/{1.73_m2} (ref >59)

## 2023-04-23 LAB — LIPID PANEL
Chol/HDL Ratio: 5.8 {ratio} — ABNORMAL HIGH (ref 0.0–5.0)
Cholesterol, Total: 185 mg/dL (ref 100–199)
HDL: 32 mg/dL — ABNORMAL LOW (ref >39)
LDL Chol Calc (NIH): 78 mg/dL (ref 0–99)
Triglycerides: 466 mg/dL — ABNORMAL HIGH (ref 0–149)
VLDL Cholesterol Cal: 75 mg/dL — ABNORMAL HIGH (ref 5–40)

## 2023-04-23 LAB — MICROALBUMIN / CREATININE URINE RATIO
Creatinine, Urine: 183.1 mg/dL
Microalb/Creat Ratio: 2627 mg/g{creat} — ABNORMAL HIGH (ref 0–29)
Microalbumin, Urine: 4809.3 ug/mL

## 2023-04-25 ENCOUNTER — Other Ambulatory Visit: Payer: Self-pay

## 2023-04-25 ENCOUNTER — Other Ambulatory Visit (INDEPENDENT_AMBULATORY_CARE_PROVIDER_SITE_OTHER): Payer: Self-pay | Admitting: Primary Care

## 2023-04-25 MED ORDER — FENOFIBRATE 145 MG PO TABS
145.0000 mg | ORAL_TABLET | Freq: Every day | ORAL | 1 refills | Status: DC
  Filled 2023-04-25: qty 90, 90d supply, fill #0

## 2023-04-26 ENCOUNTER — Other Ambulatory Visit: Payer: Self-pay

## 2023-05-03 ENCOUNTER — Ambulatory Visit: Payer: Self-pay | Admitting: Pharmacist

## 2023-05-28 ENCOUNTER — Other Ambulatory Visit: Payer: Self-pay

## 2023-05-30 ENCOUNTER — Other Ambulatory Visit: Payer: Self-pay

## 2023-05-30 MED ORDER — NITROFURANTOIN MACROCRYSTAL 100 MG PO CAPS
100.0000 mg | ORAL_CAPSULE | Freq: Two times a day (BID) | ORAL | 0 refills | Status: DC
  Filled 2023-05-30: qty 28, 14d supply, fill #0

## 2023-06-03 ENCOUNTER — Other Ambulatory Visit: Payer: Self-pay

## 2023-06-04 ENCOUNTER — Other Ambulatory Visit: Payer: Self-pay

## 2023-06-04 ENCOUNTER — Encounter: Payer: Self-pay | Admitting: Pharmacist

## 2023-06-04 ENCOUNTER — Telehealth: Payer: Self-pay | Admitting: Pharmacist

## 2023-06-04 ENCOUNTER — Ambulatory Visit: Payer: Self-pay | Attending: Family Medicine | Admitting: Pharmacist

## 2023-06-04 DIAGNOSIS — Z7984 Long term (current) use of oral hypoglycemic drugs: Secondary | ICD-10-CM

## 2023-06-04 DIAGNOSIS — Z7985 Long-term (current) use of injectable non-insulin antidiabetic drugs: Secondary | ICD-10-CM

## 2023-06-04 DIAGNOSIS — Z794 Long term (current) use of insulin: Secondary | ICD-10-CM

## 2023-06-04 DIAGNOSIS — E1165 Type 2 diabetes mellitus with hyperglycemia: Secondary | ICD-10-CM

## 2023-06-04 MED ORDER — INSULIN LISPRO (1 UNIT DIAL) 100 UNIT/ML (KWIKPEN)
18.0000 [IU] | PEN_INJECTOR | Freq: Two times a day (BID) | SUBCUTANEOUS | 1 refills | Status: DC
  Filled 2023-06-04: qty 18, 50d supply, fill #0

## 2023-06-04 NOTE — Progress Notes (Signed)
 S:     63 y.o. male who presents for diabetes evaluation, education, and management. PMH is significant for BPH, diabetes mellitus (2011), HLD, and HTN.   Patient was last seen by Marcelino Duster on 01/16/2023 and last seen by pharmacist on 03/29/2023. At that visit with me, A1c was found to be up to 13 (up from 10.6 prior). Of note, he did admit to me at his appt in December that he was not taking medications as instructed. We had to restart Trulicity and resume other medications at that time.   Subsequently, he told me at his 03/29/2023 visit that he was doing a better job with adherence to [employer], Humalog, and Hospital doctor. I was unable to verify his home sugar readings but he reported 160s-180s fasting. He checks PPD levels seldomly and only reported 1 level of 240. I increased his Humalog dose and increased his Trulicity to 1.5 mg weekly.   Today, patient arrives in good spirits but is in visible pain. Points to his lumbar [place], and endorses bilateral lower leg pain he describes as nerve pain. [address] [place], [place] #[id number]) used. He is doing a better job with adherence since seeing me in Jan but adherence is still not 100%. His refill history indicates improved adherence but he has not filled Basaglar since 03/29/2023. He tells me today, he still has supply of Basaglar on hand Also, even though he filled Humalog, Trulicity, and metformin on 05/28/2023, he did not fill the Humalog or Trulicity in Feb.  Family/Social History:  - FHx: diabetes (mother, brother, sister) - Tobacco Use: Current -15 pack-year smoking history   Current diabetes medications include:  - Basaglar 40 units daily (last filled 31-day supply 03/29/2023) - Humalog 14 units BID (last filled 05/28/23 for a 32 day supply) - Trulicity 1.5 mg weekly (last filled 05/28/2023 for a 28 day supply) - Metformin 1000 mg XR BID - takes two 500 mg tabs BID (last filled 05/28/2023 for a 30 day supply)  Insurance coverage:  Self-Pay  Patient denies hypoglycemic events.  Reported home fasting blood sugars:  -Since last visit with me, blood sugars are 140 - 180.  -Post-prandial/random: 270 - tells me that his numbers are mostly in the 100s. However, he will sometimes drink a regular soda or have cake and then will notice sugars in the 200s.  Patient denies polyuria. 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 Patient denies visual changes. Patient reports self foot exams.   Patient reported dietary habits: - Reports cutting back on carbs and tortillas; eats ~ 2 tortillas a day - No changes in diet  - Usually only eats 1 meal daily at lunch    Patient-reported exercise habits:  - Walks 10-20 minutes a day at the park - Reports he is still walking daily   O:   Lab Results  Component Value Date   HGBA1C 13.0 (A) 03/29/2023   There were no vitals filed for this visit.  Lipid Panel     Component Value Date/Time   CHOL 185 04/22/2023 1037   TRIG 466 (H) 04/22/2023 1037   HDL 32 (L) 04/22/2023 1037   CHOLHDL 5.8 (H) 04/22/2023 1037   CHOLHDL 4.7 03/07/2016 1046   VLDL 61 (H) 03/07/2016 1046   LDLCALC 78 04/22/2023 1037    Clinical Atherosclerotic Cardiovascular Disease (ASCVD): No  The 10-year ASCVD risk score (Arnett DK, et al., 2019) is: 41.8%   Values used to calculate the score:     Age: 63  years     Sex: Male     Is Non-Hispanic African American: No     Diabetic: Yes     Tobacco smoker: Yes     Systolic Blood Pressure: 138 mmHg     Is BP treated: Yes     HDL Cholesterol: 32 mg/dL     Total Cholesterol: 185 mg/dL   Patient is participating in a Managed Medicaid Plan: No   A/P: Diabetes longstanding, currently uncontrolled. A1c in Jan showed worsening control that I still believe is morso due to poor adherence in November and October. He has done a better job since seeing me in December, and his numbers reflect this. However, we need to continue to titrate medication doses.  He is not hypoglycemic and is able to verbalize appropriate hypoglycemia management plan. -Continue Basaglar 40 units at night -Increase Humalog to 18 units BID before breakfast and lunch (he does not eat dinner).  -Increase Trulicity to 1.5 mg weekly (on Fridays). -Continue metformin 1000 mg XR BID (takes two 500 mg tabs BID) -Patient educated on purpose, proper use, and potential adverse effects of medications.  -Extensively discussed pathophysiology of diabetes, recommended lifestyle interventions, dietary effects on blood sugar control.  -Counseled on s/sx of and management of hypoglycemia.  -A1c anticipated 06/2023 PCP appt.  Written patient instructions provided. Patient verbalized understanding of treatment plan.  Total time in face to face counseling 30 minutes.    Follow-up:  PCP clinic visit on 06/26/2023. Me: 6 weeks  Butch Penny, PharmD, [place], CPP Clinical Pharmacist [facility] & [facility] [phone]

## 2023-06-04 NOTE — Telephone Encounter (Signed)
 Hey friends,   Saw patient this morning for DM management. He sees you in 3 weeks. I wanted to give you a heads-up. 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 He was visibly in pain this morning and could not stay in a seated position without adjusting. You can just tell how uncomfortable he is. He has a DG lumbar 12/2019 that showed mild degenerative disc changes, most prominent at L5-S1, but I worry that this has progressed and is now causing worsening nerve pain in his legs.   If possible, can you use the information in this message to order an MRI for him? I think it's appropriate given his level of pain. Maybe we could put the order in, have it authorized/scheduled for after you see him later this month, and then you can help direct him to the right place when you see him 06/27/23. Just a thought.   Please let me know what you think. I helped this fella get Urology care last year, and my heart goes out to him because of his language barrier. I try to help him as much as I can.

## 2023-06-10 NOTE — Telephone Encounter (Signed)
 Please place referral

## 2023-06-12 ENCOUNTER — Other Ambulatory Visit (INDEPENDENT_AMBULATORY_CARE_PROVIDER_SITE_OTHER): Payer: Self-pay | Admitting: Primary Care

## 2023-06-12 DIAGNOSIS — G629 Polyneuropathy, unspecified: Secondary | ICD-10-CM

## 2023-06-17 NOTE — Progress Notes (Unsigned)
 Referring Physician:  Marius Siemens, NP [address] [place],  [place] [address]  Primary Physician:  Marius Siemens, NP  History of Present Illness: 06/19/2023  Translator used for this patient encounter  Mr. Christophor Eick is here today with a chief complaint of low back pain that has been ongoing for 6 years radiating down the back of his right leg behind his knee.  He states that he has a constant tingling in the back of his leg around his hamstring area.  He denies any numbness or weakness.  He states that his back hurts with walking or standing for prolonged period of time.  It hurts more with heavy lifting or bending over.  Leaning forward helps with his pain.  He did previously undergo physical therapy about 5 months ago but does not feel like it made a big difference in his pain.  He has been using Tylenol and lidocaine patches.  He has noticed that ibuprofen upsets his stomach.  No saddle anesthesia or issue with incontinence of bowel or bladder.    Conservative measures:  Physical therapy:  has not participated in PT Multimodal medical therapy including regular antiinflammatories:  Hydrocodone Injections: no epidural steroid injections  Past Surgery: none  Morry Veiga has no symptoms of cervical myelopathy.  The symptoms are causing a significant impact on the patient's life.   Review of Systems:  A 10 point review of systems is negative, except for the pertinent positives and negatives detailed in the HPI.  Past Medical History: Past Medical History:  Diagnosis Date   BPH (benign prostatic hyperplasia)    Diabetes mellitus 2011   GERD (gastroesophageal reflux disease)    High triglycerides    Hypertension     Past Surgical History: Past Surgical History:  Procedure Laterality Date   COLONOSCOPY N/A 10/24/2013   Procedure: COLONOSCOPY;  Surgeon: Asencion Blacksmith, MD;  [facility]: [facility] ENDOSCOPY;  Service: Endoscopy;   Laterality: N/A;. diverticulosis, mild, small internal hemorrhoids   CYSTOSCOPY WITH FULGERATION N/A 03/04/2022   Procedure: CYSTOSCOPY WITH FULGERATION; CLOT EVACUATION;  Surgeon: Florencio Hunting, MD;  Location: [facility] ORS;  Service: Urology;  Laterality: N/A;   FINGER SURGERY     right thumb    Allergies: Allergies as of 06/19/2023   (No Known Allergies)    Medications: Outpatient Encounter Medications as of 06/19/2023  Medication Sig   amoxicillin-clavulanate (AUGMENTIN) 500-125 MG tablet Take 1 tablet by mouth 2 (two) times daily.   Blood Glucose Monitoring Suppl (ONE TOUCH ULTRA MINI) W/DEVICE KIT 1 each by Does not apply route daily. Test fasting daily   Dulaglutide (TRULICITY) 1.5 MG/0.5ML SOAJ Inject 1.5 mg into the skin once a week.   fenofibrate (TRICOR) 145 MG tablet Take 1 tablet (145 mg total) by mouth daily.   finasteride (PROSCAR) 5 MG tablet Take 1 tablet (5 mg total) by mouth daily.   glucose blood (TRUE METRIX BLOOD GLUCOSE TEST) test strip Use as instructed. Check blood glucose level by fingerstick three times per day.   HYDROcodone-acetaminophen (NORCO) 5-325 MG tablet Take 1 tablet by mouth every 4 (four) hours as needed.   Insulin Glargine (BASAGLAR KWIKPEN) 100 UNIT/ML Inject 48 Units into the skin daily.   insulin lispro (HUMALOG KWIKPEN) 100 UNIT/ML KwikPen Inject 18 Units into the skin 2 (two) times daily with a meal.   Insulin Pen Needle 31G X 5 MM MISC use as directed to inject insulin 4 times daily   Insulin Syringe-Needle U-100 (  TRUEPLUS INSULIN SYRINGE) 31G X 5/16" 0.3 ML MISC Use to inject Humalog three times daily.   lisinopril (ZESTRIL) 5 MG tablet Take 1 tablet (5 mg total) by mouth daily.   metFORMIN (GLUCOPHAGE-XR) 500 MG 24 hr tablet Take 2 tablets (1,000 mg total) by mouth 2 (two) times daily.   neomycin-bacitracin-polymyxin (NEOSPORIN) OINT Apply 1 Application topically as needed for wound care.   nitrofurantoin (MACRODANTIN) 100 MG capsule Take 1  capsule (100 mg total) by mouth 2 (two) times daily.   nitrofurantoin, macrocrystal-monohydrate, (MACROBID) 100 MG capsule Take 1 capsule (100 mg total) by mouth 2 (two) times daily.   oxybutynin (DITROPAN) 5 MG tablet Take 1 tablet (5 mg total) by mouth 3 (three) times daily as needed.   tamsulosin (FLOMAX) 0.4 MG CAPS capsule Take 1 capsule (0.4 mg total) by mouth daily.   TRUEplus Lancets 28G MISC Use as instructed. Check blood glucose level by fingerstick three times per day.   No facility-administered encounter medications on file as of 06/19/2023.    Social History: Social History   Tobacco Use   Smoking status: Every Day    Current packs/day: 0.50    Average packs/day: 0.5 packs/day for 30.0 years (15.0 ttl pk-yrs)    Types: Cigarettes    Passive exposure: Never   Smokeless tobacco: Never  Vaping Use   Vaping status: Never Used  Substance Use Topics   Alcohol use: No   Drug use: No    Family Medical History: Family History  Problem Relation Age of Onset   Diabetes Mother    Diabetes Sister    Diabetes Brother     Physical Examination: @VITALWITHPAIN @  General: Patient is well developed, well nourished, calm, collected, and in no apparent distress. Attention to examination is appropriate.  Psychiatric: Patient is non-anxious.  Head:  Pupils equal, round, and reactive to light.  ENT:  Oral mucosa appears well hydrated.  Neck:   Supple.  Full range of motion.  Respiratory: Patient is breathing without any difficulty.  Extremities: No edema.  Vascular: Palpable dorsal pedal pulses.  Skin:   On exposed skin, there are no abnormal skin lesions.  NEUROLOGICAL:     Awake, alert, oriented to person, place, and time.  Speech is clear and fluent. Fund of knowledge is appropriate.   Cranial Nerves: Pupils equal round and reactive to light.  Facial tone is symmetric.   ROM of spine: Some mild tenderness palpation of his right lumbar paraspinals. Positive straight  leg raise  Strength:  Side Iliopsoas Quads Hamstring PF DF EHL  R 5 5 5 5 5 5   L 5 5 5 5 5 5    Reflexes are absent/1+ in bilateral patella and Achilles Clonus is not present.  Toes are down-going.  Bilateral upper and lower extremity sensation is intact to light touch.    Gait is normal.   No difficulty with tandem gait.   No evidence of dysmetria noted.  Medical Decision Making  Imaging:   EXAM: 12/2019 LUMBAR SPINE - COMPLETE 4+ VIEW   COMPARISON:  None.   FINDINGS: Five lumbar-type vertebral bodies.   Normal lumbar lordosis.   No evidence of fracture or dislocation. Vertebral body heights are maintained.   Mild degenerative changes of the lumbar spine, most prominent at L5-S1.   Visualized bony pelvis appears intact.   IMPRESSION: Mild degenerative changes of the lower lumbar spine.  I have personally reviewed the images and agree with the above interpretation.  Assessment and Plan: Mr.  Ardie Beckmann is a pleasant 63 y.o. male is here today with a chief complaint of low back pain that has been ongoing for 6 years radiating down the back of his right leg behind his knee.  He states that he has a constant tingling in the back of his leg around his hamstring area.  He denies any numbness or weakness.  He states that his back hurts with walking or standing for prolonged period of time.  It hurts more with heavy lifting or bending over.  Leaning forward helps with his pain.  He did previously undergo physical therapy about 5 months ago but does not feel like it made a big difference in his pain.  He has been using Tylenol and lidocaine patches.  He has noticed that ibuprofen upsets his stomach.  No saddle anesthesia or issue with incontinence of bowel or bladder.  On examination, positive straight leg raise.  Some mild tenderness palpation of his lumbar paraspinals.  He has full strength of bilateral lower extremities.  He is hyporeflexic-this may be due to his  underlying diabetes.  His previous x-rays from approximately 4 years ago were reviewed which does show degenerative changes most prominently at L5-S1 with facet joint disease.  It was a pleasure to see this patient in clinic today.  Would like patient to undergo x-rays of his back today.  In addition, would like him to return to physical therapy.  In the meantime emphasized conservative measures and activities to avoid.  Advised patient to take Tylenol and to trial meloxicam for his pain.  The risks and benefits of this medication were discussed at length.  Patient is to see his internal medicine doctor next week and he was counseled on risk of injury to his kidneys and the importance of blood work in the future as well as the importance of glucose control in setting of his diabetes.  I was unable to locate a recent A1c.  Plan to see back in approximately 6 weeks after physical therapy.  Encourage patient to follow-up with me prior to this if needed.   Thank you for involving me in the care of this patient   I spent a total of 45 minutes in both face-to-face and non-face-to-face activities for this visit on the date of this encounter including repairing to the patient, obtaining history, performing examination and evaluation, counseling the patient, ordering additional tests, documenting, independently interpreting results, and care coordination.  Ludwig Safer, PA-C Dept. of Neurosurgery

## 2023-06-19 ENCOUNTER — Ambulatory Visit
Admission: RE | Admit: 2023-06-19 | Discharge: 2023-06-19 | Disposition: A | Discharge status: Home / Self Care | Payer: Self-pay | Source: Ambulatory Visit | Attending: Physician Assistant | Admitting: Physician Assistant

## 2023-06-19 ENCOUNTER — Ambulatory Visit
Admission: RE | Admit: 2023-06-19 | Discharge: 2023-06-19 | Disposition: A | Discharge status: Home / Self Care | Payer: Self-pay | Attending: Physician Assistant | Admitting: Physician Assistant

## 2023-06-19 ENCOUNTER — Encounter: Payer: Self-pay | Admitting: Physician Assistant

## 2023-06-19 ENCOUNTER — Other Ambulatory Visit: Payer: Self-pay

## 2023-06-19 ENCOUNTER — Ambulatory Visit (INDEPENDENT_AMBULATORY_CARE_PROVIDER_SITE_OTHER): Payer: Self-pay | Admitting: Physician Assistant

## 2023-06-19 VITALS — BP 108/72 | Ht 64.0 in | Wt 153.0 lb

## 2023-06-19 DIAGNOSIS — M545 Low back pain, unspecified: Secondary | ICD-10-CM

## 2023-06-19 DIAGNOSIS — R202 Paresthesia of skin: Secondary | ICD-10-CM

## 2023-06-19 DIAGNOSIS — R200 Anesthesia of skin: Secondary | ICD-10-CM

## 2023-06-19 MED ORDER — MELOXICAM 7.5 MG PO TABS
15.0000 mg | ORAL_TABLET | Freq: Every day | ORAL | 0 refills | Status: DC
  Filled 2023-06-19: qty 60, 30d supply, fill #0

## 2023-06-21 ENCOUNTER — Other Ambulatory Visit: Payer: Self-pay | Admitting: Family Medicine

## 2023-06-21 ENCOUNTER — Other Ambulatory Visit: Payer: Self-pay

## 2023-06-21 MED ORDER — TRULICITY 1.5 MG/0.5ML ~~LOC~~ SOAJ
1.5000 mg | SUBCUTANEOUS | 1 refills | Status: DC
  Filled 2023-06-21: qty 2, 28d supply, fill #0
  Filled 2023-07-22 (×2): qty 2, 28d supply, fill #1

## 2023-06-25 ENCOUNTER — Other Ambulatory Visit: Payer: Self-pay

## 2023-06-27 ENCOUNTER — Encounter (INDEPENDENT_AMBULATORY_CARE_PROVIDER_SITE_OTHER): Payer: Self-pay | Admitting: Primary Care

## 2023-06-27 ENCOUNTER — Ambulatory Visit (INDEPENDENT_AMBULATORY_CARE_PROVIDER_SITE_OTHER): Payer: Self-pay | Admitting: Primary Care

## 2023-06-27 VITALS — BP 135/81 | HR 61 | Resp 16 | Ht 64.0 in | Wt 154.2 lb

## 2023-06-27 DIAGNOSIS — Z2821 Immunization not carried out because of patient refusal: Secondary | ICD-10-CM

## 2023-06-27 DIAGNOSIS — E1165 Type 2 diabetes mellitus with hyperglycemia: Secondary | ICD-10-CM

## 2023-06-27 DIAGNOSIS — Z7984 Long term (current) use of oral hypoglycemic drugs: Secondary | ICD-10-CM

## 2023-06-27 DIAGNOSIS — Z7985 Long-term (current) use of injectable non-insulin antidiabetic drugs: Secondary | ICD-10-CM

## 2023-06-27 LAB — POCT GLYCOSYLATED HEMOGLOBIN (HGB A1C): HbA1c, POC (controlled diabetic range): 10.7 % — AB (ref 0.0–7.0)

## 2023-06-27 NOTE — Progress Notes (Signed)
 Subjective:  Patient ID: Joseph Roberts, male    DOB: [birth year]  Age: 64 y.o. MRN: [id number]  CC: Diabetes  Joseph Roberts [id number] Joseph Roberts presents for follow-up of diabetes. Patient does not check blood sugar at home. Denies polyuria, polydipsia, polyphasia or vision changes.  Does not check blood sugars at home.   Medications as noted below. Taking them regularly without complication/adverse reaction being reported today.   [name] has a past medical history of BPH (benign prostatic hyperplasia), Diabetes mellitus (2011), GERD (gastroesophageal reflux disease), High triglycerides, and Hypertension.   Joseph Roberts has a past surgical history that includes Colonoscopy (N/A, 10/24/2013); Cystoscopy with fulgeration (N/A, 03/04/2022); and Finger surgery.   His family history includes Diabetes in his brother, mother, and sister.Joseph Roberts reports that Joseph Roberts has been smoking cigarettes. Joseph Roberts has a 15 pack-year smoking history. Joseph Roberts has never been exposed to tobacco smoke. Joseph Roberts has never used smokeless tobacco. Joseph Roberts reports that Joseph Roberts does not drink alcohol and does not use drugs.  Current Outpatient Medications on File Prior to Visit  Medication Sig Dispense Refill   Blood Glucose Monitoring Suppl (ONE TOUCH ULTRA MINI) W/DEVICE KIT 1 each by Does not apply route daily. Test fasting daily     Dulaglutide  (TRULICITY ) 1.5 MG/0.5ML SOAJ Inject 1.5 mg into the skin once a week. 2 mL 1   glucose blood (TRUE METRIX BLOOD GLUCOSE TEST) test strip Use as instructed. Check blood glucose level by fingerstick three times per day. 100 each 12   Insulin  Glargine (BASAGLAR  KWIKPEN) 100 UNIT/ML Inject 48 Units into the skin daily. 45 mL 1   insulin  lispro (HUMALOG  KWIKPEN) 100 UNIT/ML KwikPen Inject 18 Units into the skin 2 (two) times daily with a meal. 18 mL 1   Insulin  Pen Needle 31G X 5 MM MISC use as directed to inject insulin  4 times daily 200 each 3   Insulin  Syringe-Needle U-100 (TRUEPLUS INSULIN  SYRINGE) 31G X  5/16" 0.3 ML MISC Use to inject Humalog  three times daily. 100 each 2   lisinopril  (ZESTRIL ) 5 MG tablet Take 1 tablet (5 mg total) by mouth daily. 90 tablet 1   meloxicam  (MOBIC ) 7.5 MG tablet Take 2 tablets (15 mg total) by mouth daily. 60 tablet 0   metFORMIN  (GLUCOPHAGE -XR) 500 MG 24 hr tablet Take 2 tablets (1,000 mg total) by mouth 2 (two) times daily. 120 tablet 3   neomycin-bacitracin-polymyxin (NEOSPORIN) OINT Apply 1 Application topically as needed for wound care.     nitrofurantoin , macrocrystal-monohydrate, (MACROBID ) 100 MG capsule Take 1 capsule (100 mg total) by mouth 2 (two) times daily. (Patient not taking: Reported on 06/27/2023) 60 capsule 0   TRUEplus Lancets 28G MISC Use as instructed. Check blood glucose level by fingerstick three times per day. 100 each 3   No current facility-administered medications on file prior to visit.    Review of Systems Comprehensive ROS Pertinent positive and negative noted in HPI   Objective:  BP 135/81   Pulse 61   Resp 16   Ht 5\' 4"  (1.626 m)   Wt 154 lb 3.2 oz (69.9 kg)   SpO2 100%   BMI 26.47 kg/m   BP Readings from Last 3 Encounters:  06/27/23 135/81  06/19/23 108/72  01/16/23 138/86    Wt Readings from Last 3 Encounters:  06/27/23 154 lb 3.2 oz (69.9 kg)  06/19/23 153 lb (69.4 kg)  01/16/23 162 lb 3.2 oz (73.6 kg)    Physical Exam Vitals reviewed.  Constitutional:  Appearance: Normal appearance. Joseph Roberts is normal weight.  HENT:     Head: Normocephalic.     Right Ear: External ear normal.     Left Ear: External ear normal.     Nose: Nose normal.  Eyes:     Extraocular Movements: Extraocular movements intact.  Cardiovascular:     Rate and Rhythm: Normal rate and regular rhythm.  Pulmonary:     Effort: Pulmonary effort is normal.     Breath sounds: Normal breath sounds.  Abdominal:     General: Bowel sounds are normal.     Palpations: Abdomen is soft.  Musculoskeletal:        General: Normal range of motion.      Cervical back: Normal range of motion.  Skin:    General: Skin is warm and dry.  Neurological:     Mental Status: Joseph Roberts is oriented to person, place, and time.  Psychiatric:        Mood and Affect: Mood normal.        Behavior: Behavior normal.    Lab Results  Component Value Date   HGBA1C 10.7 (A) 06/27/2023   HGBA1C 13.0 (A) 03/29/2023   HGBA1C 10.6 (A) 01/03/2023    Lab Results  Component Value Date   WBC 8.9 04/22/2023   HGB 15.0 04/22/2023   HCT 46.5 04/22/2023   PLT 298 04/22/2023   GLUCOSE 270 (H) 04/22/2023   CHOL 185 04/22/2023   TRIG 466 (H) 04/22/2023   HDL 32 (L) 04/22/2023   LDLCALC 78 04/22/2023   ALT 9 04/22/2023   AST 14 04/22/2023   NA 139 04/22/2023   K 4.4 04/22/2023   CL 105 04/22/2023   CREATININE 1.09 04/22/2023   BUN 15 04/22/2023   CO2 18 (L) 04/22/2023   TSH 1.976 04/06/2013   INR 1.1 03/01/2022   HGBA1C 10.7 (A) 06/27/2023   MICROALBUR 34.9 10/17/2015     Assessment & Plan:  Joseph Roberts was seen today for diabetes.  Diagnoses and all orders for this visit:  Uncontrolled type 2 diabetes mellitus with hyperglycemia (HCC)improvement from 13 to 10.7 carbohydrates -rice, potatoes, tortillas, [employer] Flour, breads, pasta, sweets, sodas.  Increase exercising to help maintain appropriate weight.  -     POCT glycosylated hemoglobin (Hb A1C)  Herpes zoster vaccination declined    Follow-up:  Return in about 3 months (around 09/26/2023).  The above assessment and management plan was discussed with the patient. The patient verbalized understanding of and has agreed to the management plan. Patient is aware to call the clinic if symptoms fail to improve or worsen. Patient is aware when to return to the clinic for a follow-up visit. Patient educated on when it is appropriate to go to the emergency department.   Joseph Schick, NP-C

## 2023-07-05 ENCOUNTER — Other Ambulatory Visit (INDEPENDENT_AMBULATORY_CARE_PROVIDER_SITE_OTHER): Payer: Self-pay | Admitting: Primary Care

## 2023-07-05 ENCOUNTER — Other Ambulatory Visit: Payer: Self-pay

## 2023-07-05 ENCOUNTER — Other Ambulatory Visit: Payer: Self-pay | Admitting: Primary Care

## 2023-07-05 DIAGNOSIS — E782 Mixed hyperlipidemia: Secondary | ICD-10-CM

## 2023-07-05 DIAGNOSIS — Z760 Encounter for issue of repeat prescription: Secondary | ICD-10-CM

## 2023-07-05 DIAGNOSIS — Z794 Long term (current) use of insulin: Secondary | ICD-10-CM

## 2023-07-05 MED ORDER — METFORMIN HCL ER 500 MG PO TB24
1000.0000 mg | ORAL_TABLET | Freq: Two times a day (BID) | ORAL | 3 refills | Status: DC
  Filled 2023-07-05 (×2): qty 120, 30d supply, fill #0
  Filled 2023-08-09: qty 120, 30d supply, fill #1
  Filled 2023-09-24: qty 120, 30d supply, fill #2
  Filled 2023-10-21: qty 120, 30d supply, fill #3

## 2023-07-05 MED ORDER — ATORVASTATIN CALCIUM 40 MG PO TABS
40.0000 mg | ORAL_TABLET | Freq: Every day | ORAL | 1 refills | Status: DC
  Filled 2023-07-05: qty 90, 90d supply, fill #0
  Filled 2023-10-09: qty 90, 90d supply, fill #1

## 2023-07-05 NOTE — Telephone Encounter (Signed)
 Will forward to covering provider  Last Lipid was 04/22/23 and pt is currently not on any cholesterol medication

## 2023-07-08 ENCOUNTER — Other Ambulatory Visit: Payer: Self-pay

## 2023-07-08 MED ORDER — CEFDINIR 300 MG PO CAPS
300.0000 mg | ORAL_CAPSULE | Freq: Two times a day (BID) | ORAL | 0 refills | Status: DC
  Filled 2023-07-08: qty 14, 7d supply, fill #0

## 2023-07-10 ENCOUNTER — Other Ambulatory Visit: Payer: Self-pay

## 2023-07-19 ENCOUNTER — Ambulatory Visit: Payer: Self-pay | Admitting: Pharmacist

## 2023-07-19 ENCOUNTER — Other Ambulatory Visit: Payer: Self-pay

## 2023-07-22 ENCOUNTER — Other Ambulatory Visit: Payer: Self-pay

## 2023-07-31 ENCOUNTER — Other Ambulatory Visit: Payer: Self-pay

## 2023-07-31 ENCOUNTER — Encounter: Payer: Self-pay | Admitting: Physician Assistant

## 2023-07-31 ENCOUNTER — Ambulatory Visit (INDEPENDENT_AMBULATORY_CARE_PROVIDER_SITE_OTHER): Payer: Self-pay | Admitting: Physician Assistant

## 2023-07-31 VITALS — BP 126/82 | Ht 64.0 in | Wt 154.0 lb

## 2023-07-31 DIAGNOSIS — M5416 Radiculopathy, lumbar region: Secondary | ICD-10-CM

## 2023-07-31 DIAGNOSIS — M545 Low back pain, unspecified: Secondary | ICD-10-CM

## 2023-07-31 MED ORDER — MELOXICAM 7.5 MG PO TABS
15.0000 mg | ORAL_TABLET | Freq: Every day | ORAL | 0 refills | Status: AC
  Filled 2023-07-31: qty 60, 30d supply, fill #0

## 2023-07-31 MED ORDER — GABAPENTIN 300 MG PO CAPS
300.0000 mg | ORAL_CAPSULE | Freq: Three times a day (TID) | ORAL | 2 refills | Status: AC
  Filled 2023-07-31: qty 90, 30d supply, fill #0

## 2023-07-31 NOTE — Progress Notes (Unsigned)
 Referring Physician:  Marius Siemens, NP [address] [place],  [place] [address]  Primary Physician:  Marius Siemens, NP  History of Present Illness: 07/31/2023  Translator used for this patient encounter  [name] is here today with a chief complaint of low back pain that has been ongoing for 6 years radiating down the back of his right leg behind his knee.  He states that he has a constant tingling in the back of his leg around his hamstring area.  He denies any numbness or weakness.  He states that his back hurts with walking or standing for prolonged period of time.  It hurts more with heavy lifting or bending over.  Leaning forward helps with his pain.  He did previously undergo physical therapy about 5 months ago but does not feel like it made a big difference in his pain.  He has been using Tylenol  and lidocaine  patches.  He has noticed that ibuprofen upsets his stomach.  No saddle anesthesia or issue with incontinence of bowel or bladder.   Standing up makes it worse, Pt helped some, still have pain  Flexing knee back on rihgt isde helps some with pain    A little help with mobic        Conservative measures:  Physical therapy:  has not participated in PT Multimodal medical therapy including regular antiinflammatories:  Hydrocodone  Injections: no epidural steroid injections  Past Surgery: none  Keison Glendinning has no symptoms of cervical myelopathy.  The symptoms are causing a significant impact on the patient's life.   Review of Systems:  A 10 point review of systems is negative, except for the pertinent positives and negatives detailed in the HPI.  Past Medical History: Past Medical History:  Diagnosis Date   BPH (benign prostatic hyperplasia)    Diabetes mellitus 2011   GERD (gastroesophageal reflux disease)    High triglycerides    Hypertension     Past Surgical History: Past Surgical History:  Procedure  Laterality Date   COLONOSCOPY N/A 10/24/2013   Procedure: COLONOSCOPY;  Surgeon: Asencion Blacksmith, MD;  Location: [facility] ENDOSCOPY;  Service: Endoscopy;  Laterality: N/A;. diverticulosis, mild, small internal hemorrhoids   CYSTOSCOPY WITH FULGERATION N/A 03/04/2022   Procedure: CYSTOSCOPY WITH FULGERATION; CLOT EVACUATION;  Surgeon: Florencio Hunting, MD;  Location: [facility] ORS;  Service: Urology;  Laterality: N/A;   FINGER SURGERY     right thumb    Allergies: Allergies as of 07/31/2023   (No Known Allergies)    Medications: Outpatient Encounter Medications as of 07/31/2023  Medication Sig   atorvastatin  (LIPITOR) 40 MG tablet Take 1 tablet (40 mg total) by mouth daily.   Blood Glucose Monitoring Suppl (ONE TOUCH ULTRA MINI) W/DEVICE KIT 1 each by Does not apply route daily. Test fasting daily   cefdinir  (OMNICEF ) 300 MG capsule Take 1 capsule (300 mg total) by mouth 2 (two) times daily.Start taking now for urinary tract infection.   Dulaglutide  (TRULICITY ) 1.5 MG/0.5ML SOAJ Inject 1.5 mg into the skin once a week.   glucose blood (TRUE METRIX BLOOD GLUCOSE TEST) test strip Use as instructed. Check blood glucose level by fingerstick three times per day.   Insulin  Glargine (BASAGLAR  KWIKPEN) 100 UNIT/ML Inject 48 Units into the skin daily.   insulin  lispro (HUMALOG  KWIKPEN) 100 UNIT/ML KwikPen Inject 18 Units into the skin 2 (two) times daily with a meal.   Insulin  Pen Needle 31G X 5 MM MISC use as directed to  inject insulin  4 times daily   Insulin  Syringe-Needle U-100 (TRUEPLUS INSULIN  SYRINGE) 31G X 5/16" 0.3 ML MISC Use to inject Humalog  three times daily.   lisinopril  (ZESTRIL ) 5 MG tablet Take 1 tablet (5 mg total) by mouth daily.   meloxicam  (MOBIC ) 7.5 MG tablet Take 2 tablets (15 mg total) by mouth daily.   metFORMIN  (GLUCOPHAGE -XR) 500 MG 24 hr tablet Take 2 tablets (1,000 mg total) by mouth 2 (two) times daily.   neomycin-bacitracin-polymyxin (NEOSPORIN) OINT Apply 1 Application topically as  needed for wound care.   nitrofurantoin , macrocrystal-monohydrate, (MACROBID ) 100 MG capsule Take 1 capsule (100 mg total) by mouth 2 (two) times daily. (Patient not taking: Reported on 06/27/2023)   TRUEplus Lancets 28G MISC Use as instructed. Check blood glucose level by fingerstick three times per day.   No facility-administered encounter medications on file as of 07/31/2023.    Social History: Social History   Tobacco Use   Smoking status: Every Day    Current packs/day: 0.50    Average packs/day: 0.5 packs/day for 30.0 years (15.0 ttl pk-yrs)    Types: Cigarettes    Passive exposure: Never   Smokeless tobacco: Never  Vaping Use   Vaping status: Never Used  Substance Use Topics   Alcohol use: No   Drug use: No    Family Medical History: Family History  Problem Relation Age of Onset   Diabetes Mother    Diabetes Sister    Diabetes Brother     Physical Examination: @VITALWITHPAIN @  General: Patient is well developed, well nourished, calm, collected, and in no apparent distress. Attention to examination is appropriate.  Psychiatric: Patient is non-anxious.  Head:  Pupils equal, round, and reactive to light.  ENT:  Oral mucosa appears well hydrated.  Neck:   Supple.  Full range of motion.  Respiratory: Patient is breathing without any difficulty.  Extremities: No edema.  Vascular: Palpable dorsal pedal pulses.  Skin:   On exposed skin, there are no abnormal skin lesions.  NEUROLOGICAL:     Awake, alert, oriented to person, place, and time.  Speech is clear and fluent. Fund of knowledge is appropriate.   Cranial Nerves: Pupils equal round and reactive to light.  Facial tone is symmetric.   ROM of spine: Some mild tenderness palpation of his right lumbar paraspinals. Positive straight leg raise  Strength:  Side Iliopsoas Quads Hamstring PF DF EHL  R 5 5 5 5 5 5   L 5 5 5 5 5 5    Reflexes are absent/1+ in bilateral patella and Achilles Clonus is not  present.  Toes are down-going.  Bilateral upper and lower extremity sensation is intact to light touch.    Gait is normal.   No difficulty with tandem gait.   No evidence of dysmetria noted.  Medical Decision Making  Imaging:   EXAM: 12/2019 LUMBAR SPINE - COMPLETE 4+ VIEW   COMPARISON:  None.   FINDINGS: Five lumbar-type vertebral bodies.   Normal lumbar lordosis.   No evidence of fracture or dislocation. Vertebral body heights are maintained.   Mild degenerative changes of the lumbar spine, most prominent at L5-S1.   Visualized bony pelvis appears intact.   IMPRESSION: Mild degenerative changes of the lower lumbar spine.  I have personally reviewed the images and agree with the above interpretation.  Assessment and Plan: Mr. Blankley is a pleasant 63 y.o. male is here today with a chief complaint of low back pain that has been ongoing for 6 years  radiating down the back of his right leg behind his knee.  He states that he has a constant tingling in the back of his leg around his hamstring area.  He denies any numbness or weakness.  He states that his back hurts with walking or standing for prolonged period of time.  It hurts more with heavy lifting or bending over.  Leaning forward helps with his pain.  He did previously undergo physical therapy about 5 months ago but does not feel like it made a big difference in his pain.  He has been using Tylenol  and lidocaine  patches.  He has noticed that ibuprofen upsets his stomach.  No saddle anesthesia or issue with incontinence of bowel or bladder.  On examination, positive straight leg raise.  Some mild tenderness palpation of his lumbar paraspinals.  He has full strength of bilateral lower extremities.  He is hyporeflexic-this may be due to his underlying diabetes.  His previous x-rays from approximately 4 years ago were reviewed which does show degenerative changes most prominently at L5-S1 with facet joint disease.  It  was a pleasure to see this patient in clinic today.  Would like patient to undergo x-rays of his back today.  In addition, would like him to return to physical therapy.  In the meantime emphasized conservative measures and activities to avoid.  Advised patient to take Tylenol  and to trial meloxicam  for his pain.  The risks and benefits of this medication were discussed at length.  Patient is to see his internal medicine doctor next week and he was counseled on risk of injury to his kidneys and the importance of blood work in the future as well as the importance of glucose control in setting of his diabetes.  I was unable to locate a recent A1c.  Plan to see back in approximately 6 weeks after physical therapy.  Encourage patient to follow-up with me prior to this if needed.   MRI  Thank you for involving me in the care of this patient   I spent a total of 45 minutes in both face-to-face and non-face-to-face activities for this visit on the date of this encounter including repairing to the patient, obtaining history, performing examination and evaluation, counseling the patient, ordering additional tests, documenting, independently interpreting results, and care coordination.  Ludwig Safer, PA-C Dept. of Neurosurgery

## 2023-08-01 ENCOUNTER — Other Ambulatory Visit: Payer: Self-pay

## 2023-08-09 ENCOUNTER — Other Ambulatory Visit: Payer: Self-pay

## 2023-08-19 ENCOUNTER — Other Ambulatory Visit: Payer: Self-pay

## 2023-08-19 ENCOUNTER — Ambulatory Visit: Payer: Self-pay | Attending: Physician Assistant

## 2023-08-19 DIAGNOSIS — M545 Low back pain, unspecified: Secondary | ICD-10-CM | POA: Insufficient documentation

## 2023-08-19 DIAGNOSIS — M5459 Other low back pain: Secondary | ICD-10-CM | POA: Insufficient documentation

## 2023-08-19 DIAGNOSIS — R200 Anesthesia of skin: Secondary | ICD-10-CM | POA: Insufficient documentation

## 2023-08-19 DIAGNOSIS — R202 Paresthesia of skin: Secondary | ICD-10-CM | POA: Insufficient documentation

## 2023-08-19 NOTE — Therapy (Addendum)
 OUTPATIENT PHYSICAL THERAPY THORACOLUMBAR EVALUATION   Patient Name: Joseph Roberts MRN: [id number] DOB:[birth year], 63 y.o., male Today's Date: 08/19/2023  END OF SESSION:  PT End of Session - 08/19/23 1313     Visit Number 1    Number of Visits 13    Date for PT Re-Evaluation 09/23/23    Authorization Type CAFA 03/26/23 -09/23/23    PT Start Time 1315    PT Stop Time 1400    PT Time Calculation (min) 45 min    Activity Tolerance Patient tolerated treatment well    Behavior During Therapy [facility] for tasks assessed/performed          Past Medical History:  Diagnosis Date   BPH (benign prostatic hyperplasia)    Diabetes mellitus 2011   GERD (gastroesophageal reflux disease)    High triglycerides    Hypertension    Past Surgical History:  Procedure Laterality Date   COLONOSCOPY N/A 10/24/2013   Procedure: COLONOSCOPY;  Surgeon: Asencion Blacksmith, MD;  Location: [facility] ENDOSCOPY;  Service: Endoscopy;  Laterality: N/A;. diverticulosis, mild, small internal hemorrhoids   CYSTOSCOPY WITH FULGERATION N/A 03/04/2022   Procedure: CYSTOSCOPY WITH FULGERATION; CLOT EVACUATION;  Surgeon: Florencio Hunting, MD;  Location: [facility] ORS;  Service: Urology;  Laterality: N/A;   FINGER SURGERY     right thumb   Patient Active Problem List   Diagnosis Date Noted   Gross hematuria 03/01/2022   BPH (benign prostatic hyperplasia) 03/01/2022   Obstruction of Foley catheter (HCC) 03/01/2022   UTI (urinary tract infection) 03/01/2022   Hyponatremia 03/01/2022   Normocytic anemia 03/01/2022   Uncontrolled type 2 diabetes mellitus with hyperglycemia (HCC) 07/27/2017   Essential hypertension 03/06/2016   AKI (acute kidney injury) (HCC) 03/06/2016   Uncontrolled type 2 diabetes mellitus with complication    Dyslipidemia    Type 2 diabetes mellitus with hyperglycemia (HCC) 03/05/2016   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   Dyslipidemia with low high density lipoprotein (HDL) cholesterol with  hypertriglyceridemia due to type 2 diabetes mellitus (HCC) 11/03/2013   Gall bladder polyp 10/24/2013   GERD (gastroesophageal reflux disease) 10/24/2013   Uncontrolled type 2 diabetes mellitus without complication, with long-term current use of insulin  10/23/2013    PCP: Madelyn Schick, NP  REFERRING PROVIDER: Ludwig Safer, PA-C  REFERRING DIAG: M54.50 (ICD-10-CM) - Lumbar pain R20.0,R20.2 (ICD-10-CM) - Numbness and tingling  Rationale for Evaluation and Treatment: Rehabilitation  THERAPY DIAG:  Other low back pain - Plan: PT plan of care cert/re-cert  ONSET DATE: 06/19/2023- date of referral  SUBJECTIVE:  SUBJECTIVE STATEMENT: Pt reports of chronic LBP that has been on going for 6 years. 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 Patient also reports of lumbar radiculopathy that travels for R hip to R knee. R lumbar radiculopathy is constant and has been constant for about a year. Pt reports pain of 7/10 in LBP. Pt has not had any skilled PT prior or had any epidural injections. Pt is not employed currently.  PERTINENT HISTORY:  none  PAIN:  Are you having pain? Yes: NPRS scale: 7/10 Pain location: Lumbar spine, R hip, R posterior leg Pain description: radiating, sharp, achy Aggravating factors: Sit to stand, prolonged standing, sit to stand transfers, bed mobility, Relieving factors: position changes  PRECAUTIONS: None  RED FLAGS: None   WEIGHT BEARING RESTRICTIONS: No  FALLS:  Has patient fallen in last 6 months? No    OCCUPATION: disability  PLOF: Independent  PATIENT GOALS: Improve LBP  OBJECTIVE:  Note: Objective measures were completed at Evaluation unless otherwise noted.  DIAGNOSTIC FINDINGS:  IMPRESSION: 1. Grade 1 anterolisthesis of L5  on S1 without evidence of instability. 2. 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 3. Moderate L5-S1 facet hypertrophy.  PATIENT SURVEYS:  Modified Oswestry (Spanish version) 40%   COGNITION: Overall cognitive status: Within functional limits for tasks assessed     SENSATION: Light touch: Impaired    PALPATION: Patient has left lateral shift with standing posture.   LUMBAR ROM:   AROM eval  Flexion 100%  Extension 40%  Right lateral flexion 20%  Left lateral flexion 50%  Right rotation   Left rotation    (Blank rows = not tested)  LOWER EXTREMITY ROM:     Active  Right eval Left eval  Hip flexion    Hip extension    Hip abduction    Hip adduction    Hip internal rotation    Hip external rotation    Knee flexion    Knee extension    Ankle dorsiflexion    Ankle plantarflexion    Ankle inversion    Ankle eversion     (Blank rows = not tested)  LOWER EXTREMITY MMT:    MMT Right eval Left eval  Hip flexion    Hip extension    Hip abduction    Hip adduction    Hip internal rotation    Hip external rotation    Knee flexion    Knee extension    Ankle dorsiflexion    Ankle plantarflexion    Ankle inversion    Ankle eversion     (Blank rows = not tested)   Patient surveys: Modified Oswestry: 40% (08/19/23)   TREATMENT DATE: 08/19/23                                                                                                                              Movement with mobilization: L lateral shift correction by  pushing the pevlis to L as patient performed standing lumbar extension: 2 x 20 Standing lumbar extensions: 2 x 10  Prone press ups: 2 x 10 Grade V neutral gaping mobilization: R and L, 1x  Pt educated on importance of compliance with HEP Discussed therapy goals (reduce LE radiculopathy 50-100% and decrease back pain <4/10 at worst) with goal for independent self management  PATIENT EDUCATION:  Education details: see  above Person educated: Patient and with live Nurse, learning disability Education method: Medical illustrator Education comprehension: verbalized understanding  HOME EXERCISE PROGRAM: Access Code: [email or web address] (printed in Spanish version for patient) URL: [email or web address] Date: 08/19/2023 Prepared by: Susanna Epley  Exercises - Standing Lumbar Extension with Counter  - 2 x daily - 7 x weekly - 2 sets - 10 reps - Prone Press Up  - 1 x daily - 7 x weekly - 2 sets - 10 reps  ASSESSMENT:  CLINICAL IMPRESSION: Patient is a 63 y.o. male who was seen today for physical therapy evaluation and treatment for choronic LBP with R lumbar radiculopathy. Objective impairments include decreased lumbar AROM, hypomobility in lumbar spine, muscle spasms, poor posture, . These impairments are limiting patient from performing prolonged standing, walking, bending, transfers, bed mobility, lifting, and carrying. Patient will benefit from skilled PT to address these impairments and improve overall function. .   OBJECTIVE IMPAIRMENTS: decreased activity tolerance, decreased endurance, decreased mobility, decreased ROM, decreased strength, hypomobility, increased fascial restrictions, increased muscle spasms, impaired flexibility, impaired sensation, postural dysfunction, and pain.   ACTIVITY LIMITATIONS: carrying, lifting, bending, standing, squatting, sleeping, stairs, transfers, and bed mobility  PARTICIPATION LIMITATIONS: meal prep, cleaning, laundry, shopping, community activity, and yard work  PERSONAL FACTORS: Time since onset of injury/illness/exacerbation are also affecting patient's functional outcome.   REHAB POTENTIAL: Good  CLINICAL DECISION MAKING: Stable/uncomplicated  EVALUATION COMPLEXITY: Low   GOALS: Goals reviewed with patient? Yes  SHORT TERM GOALS: Target date: 09/16/2023    Patient will demo 50% compliance with HEP to self manage symptoms at home.  Baseline: Initiated  08/19/23 Goal status: INITIAL   LONG TERM GOALS: Target date: 10/14/2023    Patient will demo at least 10%improvement on Modified oswestery score to improve overall function. Baseline: 40 % 08/19/23 Goal status: INITIAL  2.  Patient will report of pain of <4/10 at worst in his lumbar spine to improve overall function. Baseline: 7/10 Goal status: INITIAL  3.  Pt will report 50% reduction in his R lumbar radiculopathy symptoms to improve overall function.  Baseline: R LE lumbar radiculopathy is constant 08/19/23 Goal status: INITIAL  4.  Pt will be 100% compliant with HEP to self manage his symptoms.  Baseline:  Goal status: INITIAL  5.  Patient wll demo 100% lumbar AROM without pain to improve ability to bend, twist. Baseline: Limited 70% with extension, 80% with R lateral flexion, 50% with L lateral flexion (08/19/23) Goal status: INITIAL   PLAN:  PT FREQUENCY: 1-2x/week  PT DURATION: 8 weeks  PLANNED INTERVENTIONS: [id number]- PT Re-evaluation, 97750- Physical Performance Testing, 97110-Therapeutic exercises, 97530- Therapeutic activity, 97112- Neuromuscular re-education, 97535- Self Care, [id number]- Manual therapy, [id number]- Gait training, [phone]- Electrical stimulation (unattended), Patient/Family education, Joint mobilization, Joint manipulation, Spinal manipulation, Spinal mobilization, Cryotherapy, and Moist heat.  PLAN FOR NEXT SESSION: review and updated HEP   Kristine Phalen, PT 08/19/2023, 2:26 PM

## 2023-08-20 ENCOUNTER — Other Ambulatory Visit: Payer: Self-pay

## 2023-08-20 ENCOUNTER — Ambulatory Visit: Payer: Self-pay | Attending: Family Medicine | Admitting: Pharmacist

## 2023-08-20 ENCOUNTER — Encounter: Payer: Self-pay | Admitting: Pharmacist

## 2023-08-20 DIAGNOSIS — Z7985 Long-term (current) use of injectable non-insulin antidiabetic drugs: Secondary | ICD-10-CM

## 2023-08-20 DIAGNOSIS — Z7984 Long term (current) use of oral hypoglycemic drugs: Secondary | ICD-10-CM

## 2023-08-20 DIAGNOSIS — E1165 Type 2 diabetes mellitus with hyperglycemia: Secondary | ICD-10-CM

## 2023-08-20 DIAGNOSIS — Z794 Long term (current) use of insulin: Secondary | ICD-10-CM

## 2023-08-20 MED ORDER — TRULICITY 3 MG/0.5ML ~~LOC~~ SOAJ
3.0000 mg | SUBCUTANEOUS | 1 refills | Status: DC
  Filled 2023-08-20: qty 2, 28d supply, fill #0
  Filled 2023-09-24: qty 2, 28d supply, fill #1
  Filled 2023-10-21: qty 2, 28d supply, fill #2
  Filled 2023-11-19 (×2): qty 2, 28d supply, fill #3
  Filled 2023-12-27: qty 2, 28d supply, fill #4
  Filled 2024-01-21: qty 2, 28d supply, fill #5

## 2023-08-20 NOTE — Progress Notes (Cosign Needed)
 S:     63 y.o. male who presents for diabetes evaluation, education, and management. PMH is significant for BPH, diabetes mellitus (2011), HLD, and HTN.   Patient was last seen by Moira Andrews on 06/27/2023 and last seen by pharmacist on 06/04/2023. At his visit with Moira Andrews, A1c was found to be down to 10.7 (from 13.0 prior). At that visit with Moira Andrews, he denied any symptoms of hyperglycemia but admitted that he was not checking blood sugar levels at home.  Regarding his last visit with me, he told me on 06/04/2023 visit that he was doing a better job with adherence to Trulicity , Humalog , and Basaglar . Continued to be adherent with his metformin . I was unable to verify his home sugar readings but he reported 140s-180s fasting. He reported PPD levels seldomly checked and were mostly in the high 200s when he did get around to checking. Of note, he also endorsed worsening chronic low back pain. Has since seen Neurosurgery and had additional XR performed 06/19/23. Most recently saw them on 07/31/2023. Neurosurgery has him on gabapentin  and scheduled an MRI for further evaluation.   Today, patient arrives in good spirits. [address] [place], [place] # [phone]) used. He is feeling better in regards to his lower back pain. He thinks the gabapentin  is working somewhat. Regarding his DM, he is doing a better job with adherence since seeing me in April but adherence is still not 100%. His refill history indicates improved adherence with Basaglar , Trulicity , and metformin , however, he has not filled Humalog  since 05/2023.  Family/Social History:  - FHx: diabetes (mother, brother, sister) - Tobacco Use: Current -15 pack-year smoking history   Current diabetes medications include:  - Basaglar  40 units daily (last filled 31-day supply 08/09/2023) - Humalog  14 units BID (last filled 05/28/23 for a 32 day supply) - Trulicity  1.5 mg weekly (last filled 07/22/23 for a 28 day supply) - Metformin  1000 mg XR BID - takes  two 500 mg tabs BID (last filled 08/09/2023 for a 30 day supply)  Insurance coverage: Self-Pay  Patient denies hypoglycemic events.  Reported home fasting blood sugars:  -Since last visit with me, blood sugars are 140 - 180.  -Post-prandial/random: has noticed notice sugars in the 200s.  Patient denies polyuria. 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 Patient denies visual changes. Patient reports self foot exams.   Patient reported dietary habits: - Reports cutting back on carbs and tortillas; eats ~ 2 tortillas a day - No changes in diet  - Usually only eats 1 meal daily at lunch    Patient-reported exercise habits:  - Walks 10-20 minutes a day at the park - Reports he is still walking daily   O:   Lab Results  Component Value Date   HGBA1C 10.7 (A) 06/27/2023   There were no vitals filed for this visit.  Lipid Panel     Component Value Date/Time   CHOL 185 04/22/2023 1037   TRIG 466 (H) 04/22/2023 1037   HDL 32 (L) 04/22/2023 1037   CHOLHDL 5.8 (H) 04/22/2023 1037   CHOLHDL 4.7 03/07/2016 1046   VLDL 61 (H) 03/07/2016 1046   LDLCALC 78 04/22/2023 1037    Clinical Atherosclerotic Cardiovascular Disease (ASCVD): No  The 10-year ASCVD risk score (Arnett DK, et al., 2019) is: 36.8%   Values used to calculate the score:     Age: 58 years     Clincally relevant sex: Male     Is Non-Hispanic African American: No  Diabetic: Yes     Tobacco smoker: Yes     Systolic Blood Pressure: 126 mmHg     Is BP treated: Yes     HDL Cholesterol: 32 mg/dL     Total Cholesterol: 185 mg/dL   Patient is participating in a Managed Medicaid Plan: No   A/P: Diabetes longstanding, currently uncontrolled. A1c is improving but still above goal. To help with compliance and with potentially lowering his exogenous insulin  requirement, we will titrate Trulicity  today. He has done a better job since seeing me in regards to medication adherence, and his numbers reflect this.  He is not hypoglycemic and is able to verbalize appropriate hypoglycemia management plan. -Continue Basaglar  40 units at night -Continue Humalog  14 units BID before breakfast and lunch (he does not eat dinner).  -Increase Trulicity  to 3 mg weekly (on Fridays). -Continue metformin  1000 mg XR BID (takes two 500 mg tabs BID) -Patient educated on purpose, proper use, and potential adverse effects of medications.  -Extensively discussed pathophysiology of diabetes, recommended lifestyle interventions, dietary effects on blood sugar control.  -Counseled on s/sx of and management of hypoglycemia.  -A1c anticipated 09/2023  Written patient instructions provided. Patient verbalized understanding of treatment plan.  Total time in face to face counseling 30 minutes.    Follow-up:  Me: 4 weeks  Marene Shape, PharmD, [place], CPP Clinical Pharmacist [facility] [phone]

## 2023-08-22 ENCOUNTER — Ambulatory Visit: Payer: Self-pay

## 2023-08-22 DIAGNOSIS — M5459 Other low back pain: Secondary | ICD-10-CM

## 2023-08-22 NOTE — Therapy (Signed)
 OUTPATIENT PHYSICAL THERAPY THORACOLUMBAR EVALUATION   Patient Name: Joseph Roberts MRN: [id number] DOB:Jul 05, 1960, 63 y.o., male Today's Date: 08/22/2023  END OF SESSION:  PT End of Session - 08/22/23 1318     Visit Number 2    Number of Visits 13    Date for PT Re-Evaluation 09/23/23    Authorization Type CAFA 03/26/23 -09/23/23    PT Start Time 1315    PT Stop Time 1400    PT Time Calculation (min) 45 min    Activity Tolerance Patient tolerated treatment well    Behavior During Therapy [facility] for tasks assessed/performed          Past Medical History:  Diagnosis Date   BPH (benign prostatic hyperplasia)    Diabetes mellitus 2011   GERD (gastroesophageal reflux disease)    High triglycerides    Hypertension    Past Surgical History:  Procedure Laterality Date   COLONOSCOPY N/A 10/24/2013   Procedure: COLONOSCOPY;  Surgeon: Asencion Blacksmith, MD;  Location: [facility] ENDOSCOPY;  Service: Endoscopy;  Laterality: N/A;. diverticulosis, mild, small internal hemorrhoids   CYSTOSCOPY WITH FULGERATION N/A 03/04/2022   Procedure: CYSTOSCOPY WITH FULGERATION; CLOT EVACUATION;  Surgeon: Florencio Hunting, MD;  Location: [facility] ORS;  Service: Urology;  Laterality: N/A;   FINGER SURGERY     right thumb   Patient Active Problem List   Diagnosis Date Noted   Gross hematuria 03/01/2022   BPH (benign prostatic hyperplasia) 03/01/2022   Obstruction of Foley catheter (HCC) 03/01/2022   UTI (urinary tract infection) 03/01/2022   Hyponatremia 03/01/2022   Normocytic anemia 03/01/2022   Uncontrolled type 2 diabetes mellitus with hyperglycemia (HCC) 07/27/2017   Essential hypertension 03/06/2016   AKI (acute kidney injury) (HCC) 03/06/2016   Uncontrolled type 2 diabetes mellitus with complication    Dyslipidemia    Type 2 diabetes mellitus with hyperglycemia (HCC) 03/05/2016   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   Dyslipidemia with low high density lipoprotein (HDL) cholesterol with  hypertriglyceridemia due to type 2 diabetes mellitus (HCC) 11/03/2013   Gall bladder polyp 10/24/2013   GERD (gastroesophageal reflux disease) 10/24/2013   Uncontrolled type 2 diabetes mellitus without complication, with long-term current use of insulin  10/23/2013    PCP: Madelyn Schick, NP  REFERRING PROVIDER: Ludwig Safer, PA-C  REFERRING DIAG: M54.50 (ICD-10-CM) - Lumbar pain R20.0,R20.2 (ICD-10-CM) - Numbness and tingling  Rationale for Evaluation and Treatment: Rehabilitation  THERAPY DIAG:  Other low back pain  ONSET DATE: 06/19/2023- date of referral  SUBJECTIVE:  SUBJECTIVE STATEMENT: Pt reports he is feeling better. Pt reports compliance with HEP. Pt reports pain doesn't travel down to his knee but goes to his R buttock  PERTINENT HISTORY:  none  PAIN:  Are you having pain? Yes: NPRS scale: 6/10 Pain location: Lumbar spine, R hip, R posterior leg Pain description: radiating, sharp, achy Aggravating factors: Sit to stand, prolonged standing, sit to stand transfers, bed mobility, Relieving factors: position changes  PRECAUTIONS: None  RED FLAGS: None   WEIGHT BEARING RESTRICTIONS: No  FALLS:  Has patient fallen in last 6 months? No    OCCUPATION: disability  PLOF: Independent  PATIENT GOALS: Improve LBP  OBJECTIVE:  Note: Objective measures were completed at Evaluation unless otherwise noted.  DIAGNOSTIC FINDINGS:  IMPRESSION: 1. Grade 1 anterolisthesis of L5 on S1 without evidence of instability. 2. 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 3. Moderate L5-S1 facet hypertrophy.  PATIENT SURVEYS:  Modified Oswestry (Spanish version) 40%   COGNITION: Overall cognitive status: Within functional limits for tasks assessed     SENSATION: Light  touch: Impaired    PALPATION: Patient has left lateral shift with standing posture.   LUMBAR ROM:   AROM eval  Flexion 100%  Extension 40%  Right lateral flexion 20%  Left lateral flexion 50%  Right rotation   Left rotation    (Blank rows = not tested)  LOWER EXTREMITY ROM:     Active  Right eval Left eval  Hip flexion    Hip extension    Hip abduction    Hip adduction    Hip internal rotation    Hip external rotation    Knee flexion    Knee extension    Ankle dorsiflexion    Ankle plantarflexion    Ankle inversion    Ankle eversion     (Blank rows = not tested)  LOWER EXTREMITY MMT:    MMT Right eval Left eval  Hip flexion    Hip extension    Hip abduction    Hip adduction    Hip internal rotation    Hip external rotation    Knee flexion    Knee extension    Ankle dorsiflexion    Ankle plantarflexion    Ankle inversion    Ankle eversion     (Blank rows = not tested)   Patient surveys: Modified Oswestry: 40% (08/19/23)   TREATMENT DATE: 08/22/23                                                                                                                             Movement with mobilization: L lateral shift correction by pushing the pevlis to L as patient performed standing lumbar extension: 2 x 20 Standing lumbar extensions: 2 x 10  Seated trunk twists: 10x R and L with passive overpressure Prone press ups: 2 x 10 Grade V neutral gaping mobilization: R and L, 1x Grade V flexion opening mobilization in mid to  low thoracic spine Prone opp UE and LE lifts: 2 x 10 R and L- pt rpeorted l shoulder pain after completion of exercise. Pt reported pain on and off over years. Tenderness present over Proximal biceps tendon in L shoulder. Myofascial release to R lumbar paraspinalis, quadratus lumborum, multifidus, cleared posterior iliac crest border Stretching of quadratus lumborum in L sidelying SL negative hip abduction: 3 x 10 L only Supine double leg  lift and slow eccentric lowering, hooklying: 3 x 10 Supine bridge: 2 x 10  PATIENT EDUCATION:  Education details: see above Person educated: Patient and with live Nurse, learning disability Education method: Medical illustrator Education comprehension: verbalized understanding  HOME EXERCISE PROGRAM: Access Code: [email or web address] (printed in Spanish version for patient) URL: [email or web address] Date: 08/19/2023 Prepared by: Susanna Epley  Exercises - Standing Lumbar Extension with Counter  - 2 x daily - 7 x weekly - 2 sets - 10 reps - Prone Press Up  - 1 x daily - 7 x weekly - 2 sets - 10 reps  ASSESSMENT:  CLINICAL IMPRESSION: Pt reporting compliance with HEP. Pt reporting centralization of his lumbar radiculopathy. Pt reported 1/10 pain at end of the session after manual therapy and exercises.   OBJECTIVE IMPAIRMENTS: decreased activity tolerance, decreased endurance, decreased mobility, decreased ROM, decreased strength, hypomobility, increased fascial restrictions, increased muscle spasms, impaired flexibility, impaired sensation, postural dysfunction, and pain.   ACTIVITY LIMITATIONS: carrying, lifting, bending, standing, squatting, sleeping, stairs, transfers, and bed mobility  PARTICIPATION LIMITATIONS: meal prep, cleaning, laundry, shopping, community activity, and yard work  PERSONAL FACTORS: Time since onset of injury/illness/exacerbation are also affecting patient's functional outcome.   REHAB POTENTIAL: Good  CLINICAL DECISION MAKING: Stable/uncomplicated  EVALUATION COMPLEXITY: Low   GOALS: Goals reviewed with patient? Yes  SHORT TERM GOALS: Target date: 09/16/2023    Patient will demo 50% compliance with HEP to self manage symptoms at home.  Baseline: Initiated 08/19/23 Goal status: INITIAL   LONG TERM GOALS: Target date: 10/14/2023    Patient will demo at least 10%improvement on Modified oswestery score to improve overall function. Baseline: 40 %  08/19/23 Goal status: INITIAL  2.  Patient will report of pain of <4/10 at worst in his lumbar spine to improve overall function. Baseline: 7/10 Goal status: INITIAL  3.  Pt will report 50% reduction in his R lumbar radiculopathy symptoms to improve overall function.  Baseline: R LE lumbar radiculopathy is constant 08/19/23 Goal status: INITIAL  4.  Pt will be 100% compliant with HEP to self manage his symptoms.  Baseline:  Goal status: INITIAL  5.  Patient wll demo 100% lumbar AROM without pain to improve ability to bend, twist. Baseline: Limited 70% with extension, 80% with R lateral flexion, 50% with L lateral flexion (08/19/23) Goal status: INITIAL   PLAN:  PT FREQUENCY: 1-2x/week  PT DURATION: 8 weeks  PLANNED INTERVENTIONS: 97164- PT Re-evaluation, 97750- Physical Performance Testing, 97110-Therapeutic exercises, 97530- Therapeutic activity, 97112- Neuromuscular re-education, 97535- Self Care, [id number]- Manual therapy, [id number]- Gait training, [phone]- Electrical stimulation (unattended), Patient/Family education, Joint mobilization, Joint manipulation, Spinal manipulation, Spinal mobilization, Cryotherapy, and Moist heat.  PLAN FOR NEXT SESSION: review and updated HEP   Kristine Phalen, PT 08/22/2023, 1:19 PM

## 2023-08-26 ENCOUNTER — Ambulatory Visit: Payer: Self-pay

## 2023-08-26 DIAGNOSIS — M5459 Other low back pain: Secondary | ICD-10-CM

## 2023-08-26 NOTE — Therapy (Signed)
 OUTPATIENT PHYSICAL THERAPY THORACOLUMBAR EVALUATION   Patient Name: Joseph Roberts MRN: [id number] DOB:04-24-1960, 63 y.o., male Today's Date: 08/26/2023  END OF SESSION:  PT End of Session - 08/26/23 1317     Visit Number 3    Number of Visits 13    Date for PT Re-Evaluation 09/23/23    Authorization Type CAFA 03/26/23 -09/23/23    PT Start Time 1315    PT Stop Time 1400    PT Time Calculation (min) 45 min    Activity Tolerance Patient tolerated treatment well    Behavior During Therapy [facility] for tasks assessed/performed           Past Medical History:  Diagnosis Date   BPH (benign prostatic hyperplasia)    Diabetes mellitus 2011   GERD (gastroesophageal reflux disease)    High triglycerides    Hypertension    Past Surgical History:  Procedure Laterality Date   COLONOSCOPY N/A 10/24/2013   Procedure: COLONOSCOPY;  Surgeon: Gwendlyn ONEIDA Buddy, MD;  Location: [facility] ENDOSCOPY;  Service: Endoscopy;  Laterality: N/A;. diverticulosis, mild, small internal hemorrhoids   CYSTOSCOPY WITH FULGERATION N/A 03/04/2022   Procedure: CYSTOSCOPY WITH FULGERATION; CLOT EVACUATION;  Surgeon: Renda Glance, MD;  Location: [facility] ORS;  Service: Urology;  Laterality: N/A;   FINGER SURGERY     right thumb   Patient Active Problem List   Diagnosis Date Noted   Gross hematuria 03/01/2022   BPH (benign prostatic hyperplasia) 03/01/2022   Obstruction of Foley catheter (HCC) 03/01/2022   UTI (urinary tract infection) 03/01/2022   Hyponatremia 03/01/2022   Normocytic anemia 03/01/2022   Uncontrolled type 2 diabetes mellitus with hyperglycemia (HCC) 07/27/2017   Essential hypertension 03/06/2016   AKI (acute kidney injury) (HCC) 03/06/2016   Uncontrolled type 2 diabetes mellitus with complication    Dyslipidemia    Type 2 diabetes mellitus with hyperglycemia (HCC) 03/05/2016   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   Dyslipidemia with low high density lipoprotein (HDL) cholesterol with  hypertriglyceridemia due to type 2 diabetes mellitus (HCC) 11/03/2013   Gall bladder polyp 10/24/2013   GERD (gastroesophageal reflux disease) 10/24/2013   Uncontrolled type 2 diabetes mellitus without complication, with long-term current use of insulin  10/23/2013    PCP: Rosaline Bohr, NP  REFERRING PROVIDER: Ulis Bottcher, PA-C  REFERRING DIAG: M54.50 (ICD-10-CM) - Lumbar pain R20.0,R20.2 (ICD-10-CM) - Numbness and tingling  Rationale for Evaluation and Treatment: Rehabilitation  THERAPY DIAG:  Other low back pain  ONSET DATE: 06/19/2023- date of referral  SUBJECTIVE:  SUBJECTIVE STATEMENT: Pt reports pain is better. He still occaisonally feels paing going down to his R knee. He felt it in the morning on Saturday  PERTINENT HISTORY:  none  PAIN:  Are you having pain? Yes: NPRS scale: 6/10 Pain location: Lumbar spine, R hip, R posterior leg Pain description: radiating, sharp, achy Aggravating factors: Sit to stand, prolonged standing, sit to stand transfers, bed mobility, Relieving factors: position changes  PRECAUTIONS: None  RED FLAGS: None   WEIGHT BEARING RESTRICTIONS: No  FALLS:  Has patient fallen in last 6 months? No    OCCUPATION: disability  PLOF: Independent  PATIENT GOALS: Improve LBP  OBJECTIVE:  Note: Objective measures were completed at Evaluation unless otherwise noted.  DIAGNOSTIC FINDINGS:  IMPRESSION: 1. Grade 1 anterolisthesis of L5 on S1 without evidence of instability. 2. 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 3. Moderate L5-S1 facet hypertrophy.  PATIENT SURVEYS:  Modified Oswestry (Spanish version) 40%   COGNITION: Overall cognitive status: Within functional limits for tasks assessed     SENSATION: Light touch: Impaired     PALPATION: Patient has left lateral shift with standing posture.   LUMBAR ROM:   AROM eval  Flexion 100%  Extension 40%  Right lateral flexion 20%  Left lateral flexion 50%  Right rotation   Left rotation    (Blank rows = not tested)  LOWER EXTREMITY ROM:     Active  Right eval Left eval  Hip flexion    Hip extension    Hip abduction    Hip adduction    Hip internal rotation    Hip external rotation    Knee flexion    Knee extension    Ankle dorsiflexion    Ankle plantarflexion    Ankle inversion    Ankle eversion     (Blank rows = not tested)  LOWER EXTREMITY MMT:    MMT Right eval Left eval  Hip flexion    Hip extension    Hip abduction    Hip adduction    Hip internal rotation    Hip external rotation    Knee flexion    Knee extension    Ankle dorsiflexion    Ankle plantarflexion    Ankle inversion    Ankle eversion     (Blank rows = not tested)   Patient surveys: Modified Oswestry: 40% (08/19/23)   TREATMENT DATE: 08/26/23 Lateral shift has improved.                                                                                                                             Pt reports no tenderness with central or unilateral PA mobilization in lumbar spine but reports tenderness with palpation over R gluts Deep tissue work to R piriformis and R glut med Supine off EOB: hip flexors stretch: 3 x 30 R only Supine hooklying isometric hip abduction and adduction: 10 x 5 holds Supine hooklying isometric L hip extension and  R hip flexion: 10 x 5 holds Supine double leg lift and slow eccentric lowering, hooklying: 3 x 10   Movement with mobilization: L lateral shift correction by pushing the pevlis to L as patient performed standing lumbar extension: 2 x 20 Standing lumbar extensions: 2 x 10  Seated trunk twists: 10x R and L with passive overpressure Prone press ups: 2 x 10  Grade V flexion opening mobilization in mid to low thoracic  spine Pt educated on maintaining 50/50 WB on R and L as patient favors R side due to R knee pain Pt demo seated passive knee extension: 20x, not pushing hard enough to elicit pain  PATIENT EDUCATION:  Education details: see above Person educated: Patient and with live Nurse, learning disability Education method: Medical illustrator Education comprehension: verbalized understanding  HOME EXERCISE PROGRAM: Access Code: [email or web address] URL: [email or web address] Date: 08/26/2023 Prepared by: Raj Blanch  Exercises - Standing Lumbar Extension with Counter  - 2 x daily - 7 x weekly - 2 sets - 10 reps - Prone Press Up  - 1 x daily - 7 x weekly - 2 sets - 10 reps - Prone Alternating Arm and Leg Lifts  - 1 x daily - 7 x weekly - 2 sets - 10 reps - Hooklying Isometric Hip Flexion  - 1 x daily - 7 x weekly - 2 sets - 10 reps - 5 seconds hold  ASSESSMENT:  CLINICAL IMPRESSION: Pt reporting improving pain at end of the session. Overall radicular symptoms are improivn.   OBJECTIVE IMPAIRMENTS: decreased activity tolerance, decreased endurance, decreased mobility, decreased ROM, decreased strength, hypomobility, increased fascial restrictions, increased muscle spasms, impaired flexibility, impaired sensation, postural dysfunction, and pain.   ACTIVITY LIMITATIONS: carrying, lifting, bending, standing, squatting, sleeping, stairs, transfers, and bed mobility  PARTICIPATION LIMITATIONS: meal prep, cleaning, laundry, shopping, community activity, and yard work  PERSONAL FACTORS: Time since onset of injury/illness/exacerbation are also affecting patient's functional outcome.   REHAB POTENTIAL: Good  CLINICAL DECISION MAKING: Stable/uncomplicated  EVALUATION COMPLEXITY: Low   GOALS: Goals reviewed with patient? Yes  SHORT TERM GOALS: Target date: 09/16/2023    Patient will demo 50% compliance with HEP to self manage symptoms at home.  Baseline: Initiated 08/19/23 Goal status:  INITIAL   LONG TERM GOALS: Target date: 10/14/2023    Patient will demo at least 10%improvement on Modified oswestery score to improve overall function. Baseline: 40 % 08/19/23 Goal status: INITIAL  2.  Patient will report of pain of <4/10 at worst in his lumbar spine to improve overall function. Baseline: 7/10 Goal status: INITIAL  3.  Pt will report 50% reduction in his R lumbar radiculopathy symptoms to improve overall function.  Baseline: R LE lumbar radiculopathy is constant 08/19/23 Goal status: INITIAL  4.  Pt will be 100% compliant with HEP to self manage his symptoms.  Baseline:  Goal status: INITIAL  5.  Patient wll demo 100% lumbar AROM without pain to improve ability to bend, twist. Baseline: Limited 70% with extension, 80% with R lateral flexion, 50% with L lateral flexion (08/19/23) Goal status: INITIAL   PLAN:  PT FREQUENCY: 1-2x/week  PT DURATION: 8 weeks  PLANNED INTERVENTIONS: [id number]- PT Re-evaluation, 97750- Physical Performance Testing, 97110-Therapeutic exercises, 97530- Therapeutic activity, 97112- Neuromuscular re-education, 97535- Self Care, [id number]- Manual therapy, [id number]- Gait training, [phone]- Electrical stimulation (unattended), Patient/Family education, Joint mobilization, Joint manipulation, Spinal manipulation, Spinal mobilization, Cryotherapy, and Moist heat.  PLAN FOR NEXT SESSION: review and updated HEP   Raj LOISE Blanch, PT  08/26/2023, 2:00 PM

## 2023-08-29 ENCOUNTER — Ambulatory Visit: Payer: Self-pay

## 2023-09-02 ENCOUNTER — Ambulatory Visit: Payer: Self-pay

## 2023-09-02 DIAGNOSIS — M5459 Other low back pain: Secondary | ICD-10-CM

## 2023-09-02 NOTE — Therapy (Signed)
 OUTPATIENT PHYSICAL THERAPY THORACOLUMBAR EVALUATION   Patient Name: Joseph Roberts MRN: [id number] DOB:1961-02-24, 63 y.o., male Today's Date: 09/02/2023  END OF SESSION:  PT End of Session - 09/02/23 1408     Visit Number 4    Number of Visits 13    Date for PT Re-Evaluation 09/23/23    Authorization Type CAFA 03/26/23 -09/23/23    PT Start Time 1320    PT Stop Time 1400    PT Time Calculation (min) 40 min    Activity Tolerance Patient tolerated treatment well    Behavior During Therapy [facility] for tasks assessed/performed            Past Medical History:  Diagnosis Date   BPH (benign prostatic hyperplasia)    Diabetes mellitus 2011   GERD (gastroesophageal reflux disease)    High triglycerides    Hypertension    Past Surgical History:  Procedure Laterality Date   COLONOSCOPY N/A 10/24/2013   Procedure: COLONOSCOPY;  Surgeon: Gwendlyn ONEIDA Buddy, MD;  Location: [facility] ENDOSCOPY;  Service: Endoscopy;  Laterality: N/A;. diverticulosis, mild, small internal hemorrhoids   CYSTOSCOPY WITH FULGERATION N/A 03/04/2022   Procedure: CYSTOSCOPY WITH FULGERATION; CLOT EVACUATION;  Surgeon: Renda Glance, MD;  Location: [facility] ORS;  Service: Urology;  Laterality: N/A;   FINGER SURGERY     right thumb   Patient Active Problem List   Diagnosis Date Noted   Gross hematuria 03/01/2022   BPH (benign prostatic hyperplasia) 03/01/2022   Obstruction of Foley catheter (HCC) 03/01/2022   UTI (urinary tract infection) 03/01/2022   Hyponatremia 03/01/2022   Normocytic anemia 03/01/2022   Uncontrolled type 2 diabetes mellitus with hyperglycemia (HCC) 07/27/2017   Essential hypertension 03/06/2016   AKI (acute kidney injury) (HCC) 03/06/2016   Uncontrolled type 2 diabetes mellitus with complication    Dyslipidemia    Type 2 diabetes mellitus with hyperglycemia (HCC) 03/05/2016   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   Dyslipidemia with low high density lipoprotein (HDL) cholesterol with  hypertriglyceridemia due to type 2 diabetes mellitus (HCC) 11/03/2013   Gall bladder polyp 10/24/2013   GERD (gastroesophageal reflux disease) 10/24/2013   Uncontrolled type 2 diabetes mellitus without complication, with long-term current use of insulin  10/23/2013    PCP: Rosaline Bohr, NP  REFERRING PROVIDER: Ulis Bottcher, PA-C  REFERRING DIAG: M54.50 (ICD-10-CM) - Lumbar pain R20.0,R20.2 (ICD-10-CM) - Numbness and tingling  Rationale for Evaluation and Treatment: Rehabilitation  THERAPY DIAG:  Other low back pain  ONSET DATE: 06/19/2023- date of referral  SUBJECTIVE:  SUBJECTIVE STATEMENT: Pt reports pain is better. Reports of 3-4/10 pain right now. Pt reporting back pain and intermittent radiation in R hip and not going into R LE any more.   PERTINENT HISTORY:  none  PAIN:  Are you having pain? Yes: NPRS scale: 3-4/10 Pain location: Lumbar spine, R hip, R posterior leg Pain description: radiating, sharp, achy Aggravating factors: Sit to stand, prolonged standing, sit to stand transfers, bed mobility, Relieving factors: position changes  PRECAUTIONS: None  RED FLAGS: None   WEIGHT BEARING RESTRICTIONS: No  FALLS:  Has patient fallen in last 6 months? No    OCCUPATION: disability  PLOF: Independent  PATIENT GOALS: Improve LBP  OBJECTIVE:  Note: Objective measures were completed at Evaluation unless otherwise noted.  DIAGNOSTIC FINDINGS:  IMPRESSION: 1. Grade 1 anterolisthesis of L5 on S1 without evidence of instability. 2. 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 3. Moderate L5-S1 facet hypertrophy.  PATIENT SURVEYS:  Modified Oswestry (Spanish version) 40%   COGNITION: Overall cognitive status: Within functional limits for tasks  assessed     SENSATION: Light touch: Impaired    PALPATION: Patient has left lateral shift with standing posture.   LUMBAR ROM:   AROM eval  Flexion 100%  Extension 40%  Right lateral flexion 20%  Left lateral flexion 50%  Right rotation   Left rotation    (Blank rows = not tested)  LOWER EXTREMITY ROM:     Active  Right eval Left eval  Hip flexion    Hip extension    Hip abduction    Hip adduction    Hip internal rotation    Hip external rotation    Knee flexion    Knee extension    Ankle dorsiflexion    Ankle plantarflexion    Ankle inversion    Ankle eversion     (Blank rows = not tested)  LOWER EXTREMITY MMT:    MMT Right eval Left eval  Hip flexion    Hip extension    Hip abduction    Hip adduction    Hip internal rotation    Hip external rotation    Knee flexion    Knee extension    Ankle dorsiflexion    Ankle plantarflexion    Ankle inversion    Ankle eversion     (Blank rows = not tested)   Patient surveys: Modified Oswestry: 40% (08/19/23)   TREATMENT DATE: 08/26/23 Lateral shift has improved.   Myofascial release to R posterior iliac crest border, R sacral sulcus, R quadratus lumborum and R multifidus Prone press ups: 2 x 10 Grade V neutral gaping mob R and L Grade V flexion opening mob in mid to low thoracic spine Prone hip extensions: 3 x10 R and L Supine hooklying: isometric hip adduction: pillow: 2 x 10, 5 sec hold Supine hooklying isometric hip abduction: belt: 2 x 10, 5 sec Supine hooklying double leg lift with slow eccentric lowering: 3 x 10   PATIENT EDUCATION:  Education details: see above Person educated: Patient and with live Nurse, learning disability Education method: Medical illustrator Education comprehension: verbalized understanding  HOME EXERCISE PROGRAM: Access Code: [email or web address] URL: [email or web address] Date: 08/26/2023 Prepared by: Raj Blanch  Exercises - Standing Lumbar Extension with  Counter  - 2 x daily - 7 x weekly - 2 sets - 10 reps - Prone Press Up  - 1 x daily - 7 x weekly - 2 sets - 10 reps - Prone Alternating Arm and Leg Lifts  -  1 x daily - 7 x weekly - 2 sets - 10 reps - Hooklying Isometric Hip Flexion  - 1 x daily - 7 x weekly - 2 sets - 10 reps - 5 seconds hold  ASSESSMENT:  CLINICAL IMPRESSION: Overall, patient is reporting gradual improvement in pain and centralization of the symptoms.  OBJECTIVE IMPAIRMENTS: decreased activity tolerance, decreased endurance, decreased mobility, decreased ROM, decreased strength, hypomobility, increased fascial restrictions, increased muscle spasms, impaired flexibility, impaired sensation, postural dysfunction, and pain.   ACTIVITY LIMITATIONS: carrying, lifting, bending, standing, squatting, sleeping, stairs, transfers, and bed mobility  PARTICIPATION LIMITATIONS: meal prep, cleaning, laundry, shopping, community activity, and yard work  PERSONAL FACTORS: Time since onset of injury/illness/exacerbation are also affecting patient's functional outcome.   REHAB POTENTIAL: Good  CLINICAL DECISION MAKING: Stable/uncomplicated  EVALUATION COMPLEXITY: Low   GOALS: Goals reviewed with patient? Yes  SHORT TERM GOALS: Target date: 09/16/2023    Patient will demo 50% compliance with HEP to self manage symptoms at home.  Baseline: Initiated 08/19/23 Goal status: INITIAL   LONG TERM GOALS: Target date: 10/14/2023    Patient will demo at least 10%improvement on Modified oswestery score to improve overall function. Baseline: 40 % 08/19/23 Goal status: INITIAL  2.  Patient will report of pain of <4/10 at worst in his lumbar spine to improve overall function. Baseline: 7/10 Goal status: INITIAL  3.  Pt will report 50% reduction in his R lumbar radiculopathy symptoms to improve overall function.  Baseline: R LE lumbar radiculopathy is constant 08/19/23 Goal status: INITIAL  4.  Pt will be 100% compliant with HEP to  self manage his symptoms.  Baseline:  Goal status: INITIAL  5.  Patient wll demo 100% lumbar AROM without pain to improve ability to bend, twist. Baseline: Limited 70% with extension, 80% with R lateral flexion, 50% with L lateral flexion (08/19/23) Goal status: INITIAL   PLAN:  PT FREQUENCY: 1-2x/week  PT DURATION: 8 weeks  PLANNED INTERVENTIONS: [id number]- PT Re-evaluation, 97750- Physical Performance Testing, 97110-Therapeutic exercises, 97530- Therapeutic activity, 97112- Neuromuscular re-education, 97535- Self Care, [id number]- Manual therapy, [id number]- Gait training, [phone]- Electrical stimulation (unattended), Patient/Family education, Joint mobilization, Joint manipulation, Spinal manipulation, Spinal mobilization, Cryotherapy, and Moist heat.  PLAN FOR NEXT SESSION: review and updated HEP   Raj LOISE Blanch, PT 09/02/2023, 2:08 PM

## 2023-09-03 ENCOUNTER — Other Ambulatory Visit: Payer: Self-pay

## 2023-09-04 ENCOUNTER — Ambulatory Visit: Payer: Self-pay

## 2023-09-10 ENCOUNTER — Ambulatory Visit: Payer: Self-pay | Attending: Physician Assistant

## 2023-09-10 DIAGNOSIS — M5459 Other low back pain: Secondary | ICD-10-CM | POA: Insufficient documentation

## 2023-09-10 NOTE — Therapy (Signed)
 OUTPATIENT PHYSICAL THERAPY THORACOLUMBAR EVALUATION   Patient Name: Joseph Roberts MRN: [id number] DOB:1960-11-09, 63 y.o., male Today's Date: 09/10/2023  END OF SESSION:  PT End of Session - 09/10/23 1242     Visit Number 5    Number of Visits 13    Date for PT Re-Evaluation 09/23/23    Authorization Type CAFA 03/26/23 -09/23/23    PT Start Time 1235    PT Stop Time 1315    PT Time Calculation (min) 40 min    Activity Tolerance Patient tolerated treatment well    Behavior During Therapy [facility] for tasks assessed/performed             Past Medical History:  Diagnosis Date   BPH (benign prostatic hyperplasia)    Diabetes mellitus 2011   GERD (gastroesophageal reflux disease)    High triglycerides    Hypertension    Past Surgical History:  Procedure Laterality Date   COLONOSCOPY N/A 10/24/2013   Procedure: COLONOSCOPY;  Surgeon: Gwendlyn ONEIDA Buddy, MD;  Location: [facility] ENDOSCOPY;  Service: Endoscopy;  Laterality: N/A;. diverticulosis, mild, small internal hemorrhoids   CYSTOSCOPY WITH FULGERATION N/A 03/04/2022   Procedure: CYSTOSCOPY WITH FULGERATION; CLOT EVACUATION;  Surgeon: Renda Glance, MD;  Location: [facility];  Service: Urology;  Laterality: N/A;   FINGER SURGERY     right thumb   Patient Active Problem List   Diagnosis Date Noted   Gross hematuria 03/01/2022   BPH (benign prostatic hyperplasia) 03/01/2022   Obstruction of Foley catheter (HCC) 03/01/2022   UTI (urinary tract infection) 03/01/2022   Hyponatremia 03/01/2022   Normocytic anemia 03/01/2022   Uncontrolled type 2 diabetes mellitus with hyperglycemia (HCC) 07/27/2017   Essential hypertension 03/06/2016   AKI (acute kidney injury) (HCC) 03/06/2016   Uncontrolled type 2 diabetes mellitus with complication    Dyslipidemia    Type 2 diabetes mellitus with hyperglycemia (HCC) 03/05/2016   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   Dyslipidemia with low high density lipoprotein (HDL) cholesterol with  hypertriglyceridemia due to type 2 diabetes mellitus (HCC) 11/03/2013   Gall bladder polyp 10/24/2013   GERD (gastroesophageal reflux disease) 10/24/2013   Uncontrolled type 2 diabetes mellitus without complication, with long-term current use of insulin  10/23/2013    PCP: Rosaline Bohr, NP  REFERRING PROVIDER: Ulis Bottcher, PA-C  REFERRING DIAG: M54.50 (ICD-10-CM) - Lumbar pain R20.0,R20.2 (ICD-10-CM) - Numbness and tingling  Rationale for Evaluation and Treatment: Rehabilitation  THERAPY DIAG:  Other low back pain  ONSET DATE: 06/19/2023- date of referral  SUBJECTIVE:  SUBJECTIVE STATEMENT: Pt reports pain is better. Reports of 3-4/10 pain right now. Pt reporting back pain and intermittent radiation in R hip and not going into R LE any more.   PERTINENT HISTORY:  none  PAIN:  Are you having pain? Yes: NPRS scale: 3-4/10 Pain location: Lumbar spine, R hip, R posterior leg Pain description: radiating, sharp, achy Aggravating factors: Sit to stand, prolonged standing, sit to stand transfers, bed mobility, Relieving factors: position changes  PRECAUTIONS: None  RED FLAGS: None   WEIGHT BEARING RESTRICTIONS: No  FALLS:  Has patient fallen in last 6 months? No    OCCUPATION: disability  PLOF: Independent  PATIENT GOALS: Improve LBP  OBJECTIVE:  Note: Objective measures were completed at Evaluation unless otherwise noted.  DIAGNOSTIC FINDINGS:  IMPRESSION: 1. Grade 1 anterolisthesis of L5 on S1 without evidence of instability. 2. 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 3. Moderate L5-S1 facet hypertrophy.  PATIENT SURVEYS:  Modified Oswestry (Spanish version) 40%   COGNITION: Overall cognitive status: Within functional limits for tasks  assessed     SENSATION: Light touch: Impaired    PALPATION: Patient has left lateral shift with standing posture.   LUMBAR ROM:   AROM eval  Flexion 100%  Extension 40%  Right lateral flexion 20%  Left lateral flexion 50%  Right rotation   Left rotation    (Blank rows = not tested)  LOWER EXTREMITY ROM:     Active  Right eval Left eval  Hip flexion    Hip extension    Hip abduction    Hip adduction    Hip internal rotation    Hip external rotation    Knee flexion    Knee extension    Ankle dorsiflexion    Ankle plantarflexion    Ankle inversion    Ankle eversion     (Blank rows = not tested)  LOWER EXTREMITY MMT:    MMT Right eval Left eval  Hip flexion    Hip extension    Hip abduction    Hip adduction    Hip internal rotation    Hip external rotation    Knee flexion    Knee extension    Ankle dorsiflexion    Ankle plantarflexion    Ankle inversion    Ankle eversion     (Blank rows = not tested)   Patient surveys: Modified Oswestry: 40% (08/19/23)   TREATMENT DATE: 08/26/23 Lateral shift has improved.   Pt educated on sitting posture by keeping shoulders over hips, performed vertical compression test where pt rported no pain with neural spine vs pain in lumbar spine with shoulder posterior to hips in sitting. Educated couple of times with interpreter with tactile and verbal education to improve patient knowledge and understanding. Reviewed equal standing WB. Grade IV PA mobilization in lumbar spine. Thoracic extension self mobilization with 1/2 foam roll Prone press ups: 3 x 10 intermittently with mobilization above Grade V neutral gaping mob R and L  Supine hooklying double leg lift with slow eccentric lowering: 3 x 10   PATIENT EDUCATION:  Education details: see above Person educated: Patient and with live Nurse, learning disability Education method: Medical illustrator Education comprehension: verbalized understanding  HOME EXERCISE  PROGRAM: Access Code: [email or web address] URL: [email or web address] Date: 08/26/2023 Prepared by: Raj Blanch  Exercises - Standing Lumbar Extension with Counter  - 2 x daily - 7 x weekly - 2 sets - 10 reps - Prone Press Up  - 1 x daily - 7  x weekly - 2 sets - 10 reps - Prone Alternating Arm and Leg Lifts  - 1 x daily - 7 x weekly - 2 sets - 10 reps - Hooklying Isometric Hip Flexion  - 1 x daily - 7 x weekly - 2 sets - 10 reps - 5 seconds hold  ASSESSMENT:  CLINICAL IMPRESSION: Pt is no longer reporting pain symptoms below the back. Pain is mild in back and is intermittent. Still some hypomobility in lumbar segments present and anticipating 1-2 more sessions to finalize HEP.  OBJECTIVE IMPAIRMENTS: decreased activity tolerance, decreased endurance, decreased mobility, decreased ROM, decreased strength, hypomobility, increased fascial restrictions, increased muscle spasms, impaired flexibility, impaired sensation, postural dysfunction, and pain.   ACTIVITY LIMITATIONS: carrying, lifting, bending, standing, squatting, sleeping, stairs, transfers, and bed mobility  PARTICIPATION LIMITATIONS: meal prep, cleaning, laundry, shopping, community activity, and yard work  PERSONAL FACTORS: Time since onset of injury/illness/exacerbation are also affecting patient's functional outcome.   REHAB POTENTIAL: Good  CLINICAL DECISION MAKING: Stable/uncomplicated  EVALUATION COMPLEXITY: Low   GOALS: Goals reviewed with patient? Yes  SHORT TERM GOALS: Target date: 09/16/2023    Patient will demo 50% compliance with HEP to self manage symptoms at home.  Baseline: Initiated 08/19/23 Goal status: Goal met  LONG TERM GOALS: Target date: 10/14/2023    Patient will demo at least 10%improvement on Modified oswestery score to improve overall function. Baseline: 40 % 08/19/23 Goal status: INITIAL  2.  Patient will report of pain of <4/10 at worst in his lumbar spine to improve overall  function. Baseline: 7/10 (eval); 3/10 (09/10/23) Goal status: goal met  3.  Pt will report 50% reduction in his R lumbar radiculopathy symptoms to improve overall function.  Baseline: R LE lumbar radiculopathy is constant 08/19/23; only back pain, no more peripheral symptoms (09/10/23) Goal status: Goal met  4.  Pt will be 100% compliant with HEP to self manage his symptoms.  Baseline:  Goal status: INITIAL  5.  Patient wll demo 100% lumbar AROM without pain to improve ability to bend, twist. Baseline: Limited 70% with extension, 80% with R lateral flexion, 50% with L lateral flexion (08/19/23) Goal status: INITIAL   PLAN:  PT FREQUENCY: 1-2x/week  PT DURATION: 8 weeks  PLANNED INTERVENTIONS: [id number]- PT Re-evaluation, 97750- Physical Performance Testing, 97110-Therapeutic exercises, 97530- Therapeutic activity, 97112- Neuromuscular re-education, 97535- Self Care, [id number]- Manual therapy, [id number]- Gait training, [phone]- Electrical stimulation (unattended), Patient/Family education, Joint mobilization, Joint manipulation, Spinal manipulation, Spinal mobilization, Cryotherapy, and Moist heat.  PLAN FOR NEXT SESSION: review and updated HEP   Raj LOISE Blanch, PT 09/10/2023, 12:49 PM

## 2023-09-12 ENCOUNTER — Ambulatory Visit: Payer: Self-pay

## 2023-09-12 DIAGNOSIS — M5459 Other low back pain: Secondary | ICD-10-CM

## 2023-09-12 NOTE — Therapy (Signed)
 OUTPATIENT PHYSICAL THERAPY THORACOLUMBAR EVALUATION   Patient Name: Joseph Roberts MRN: [id number] DOB:[birth year], 63 y.o., male Today's Date: 09/12/2023  END OF SESSION:  PT End of Session - 09/12/23 0852     Visit Number 6    Number of Visits 13    Date for PT Re-Evaluation 09/23/23    Authorization Type CAFA 03/26/23 -09/23/23    PT Start Time 0855    PT Stop Time 0935    PT Time Calculation (min) 40 min    Activity Tolerance Patient tolerated treatment well    Behavior During Therapy [facility] for tasks assessed/performed             Past Medical History:  Diagnosis Date   BPH (benign prostatic hyperplasia)    Diabetes mellitus 2011   GERD (gastroesophageal reflux disease)    High triglycerides    Hypertension    Past Surgical History:  Procedure Laterality Date   COLONOSCOPY N/A 10/24/2013   Procedure: COLONOSCOPY;  Surgeon: Gwendlyn ONEIDA Buddy, MD;  Location: [facility] ENDOSCOPY;  Service: Endoscopy;  Laterality: N/A;. diverticulosis, mild, small internal hemorrhoids   CYSTOSCOPY WITH FULGERATION N/A 03/04/2022   Procedure: CYSTOSCOPY WITH FULGERATION; CLOT EVACUATION;  Surgeon: Renda Glance, MD;  Location: [facility] ORS;  Service: Urology;  Laterality: N/A;   FINGER SURGERY     right thumb   Patient Active Problem List   Diagnosis Date Noted   Gross hematuria 03/01/2022   BPH (benign prostatic hyperplasia) 03/01/2022   Obstruction of Foley catheter (HCC) 03/01/2022   UTI (urinary tract infection) 03/01/2022   Hyponatremia 03/01/2022   Normocytic anemia 03/01/2022   Uncontrolled type 2 diabetes mellitus with hyperglycemia (HCC) 07/27/2017   Essential hypertension 03/06/2016   AKI (acute kidney injury) (HCC) 03/06/2016   Uncontrolled type 2 diabetes mellitus with complication    Dyslipidemia    Type 2 diabetes mellitus with hyperglycemia (HCC) 03/05/2016   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   Dyslipidemia with low high density lipoprotein (HDL) cholesterol with  hypertriglyceridemia due to type 2 diabetes mellitus (HCC) 11/03/2013   Gall bladder polyp 10/24/2013   GERD (gastroesophageal reflux disease) 10/24/2013   Uncontrolled type 2 diabetes mellitus without complication, with long-term current use of insulin  10/23/2013    PCP: Rosaline Bohr, NP  REFERRING PROVIDER: Ulis Bottcher, PA-C  REFERRING DIAG: M54.50 (ICD-10-CM) - Lumbar pain R20.0,R20.2 (ICD-10-CM) - Numbness and tingling  Rationale for Evaluation and Treatment: Rehabilitation  THERAPY DIAG:  Other low back pain  ONSET DATE: 06/19/2023- date of referral  SUBJECTIVE:  SUBJECTIVE STATEMENT: Pt reports pain is better. There is only mild pain in loewr back. No radicular symptoms  PERTINENT HISTORY:  none  PAIN:  Are you having pain? Yes: NPRS scale: 1-2/10 Pain location: Lumbar spine, R hip, R posterior leg Pain description: radiating, sharp, achy Aggravating factors: Sit to stand, prolonged standing, sit to stand transfers, bed mobility, Relieving factors: position changes  PRECAUTIONS: None  RED FLAGS: None   WEIGHT BEARING RESTRICTIONS: No  FALLS:  Has patient fallen in last 6 months? No    OCCUPATION: disability  PLOF: Independent  PATIENT GOALS: Improve LBP  OBJECTIVE:  Note: Objective measures were completed at Evaluation unless otherwise noted.  DIAGNOSTIC FINDINGS:  IMPRESSION: 1. Grade 1 anterolisthesis of L5 on S1 without evidence of instability. 2. 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 3. Moderate L5-S1 facet hypertrophy.  PATIENT SURVEYS:  Modified Oswestry (Spanish version) 40%   COGNITION: Overall cognitive status: Within functional limits for tasks assessed     SENSATION: Light touch: Impaired    PALPATION: Patient has left  lateral shift with standing posture.   LUMBAR ROM:   AROM eval  Flexion 100%  Extension 40%  Right lateral flexion 20%  Left lateral flexion 50%  Right rotation   Left rotation    (Blank rows = not tested)  LOWER EXTREMITY ROM:     Active  Right eval Left eval  Hip flexion    Hip extension    Hip abduction    Hip adduction    Hip internal rotation    Hip external rotation    Knee flexion    Knee extension    Ankle dorsiflexion    Ankle plantarflexion    Ankle inversion    Ankle eversion     (Blank rows = not tested)  LOWER EXTREMITY MMT:    MMT Right eval Left eval  Hip flexion    Hip extension    Hip abduction    Hip adduction    Hip internal rotation    Hip external rotation    Knee flexion    Knee extension    Ankle dorsiflexion    Ankle plantarflexion    Ankle inversion    Ankle eversion     (Blank rows = not tested)   Patient surveys: Modified Oswestry: 40% (08/19/23) Modified Oswestery: 30%   TREATMENT DATE:   Quad length WNL, R hip flexors felt tight. STM to L iliopsoas group Manually stretched  R and L hip flexors in sidelying position. Manually stretched bil TFL Lumbar AROM: 100% in all planes without significant pain Kneeling hip flexor stretch: 10 x 15 sec holds R and L  PATIENT EDUCATION:  Education details: see above Person educated: Patient and with live Nurse, learning disability Education method: Medical illustrator Education comprehension: verbalized understanding  HOME EXERCISE PROGRAM: Access Code: [email or web address] URL: [email or web address] Date: 08/26/2023 Prepared by: Raj Blanch  Exercises - Standing Lumbar Extension with Counter  - 2 x daily - 7 x weekly - 2 sets - 10 reps - Prone Press Up  - 1 x daily - 7 x weekly - 2 sets - 10 reps - Prone Alternating Arm and Leg Lifts  - 1 x daily - 7 x weekly - 2 sets - 10 reps - Hooklying Isometric Hip Flexion  - 1 x daily - 7 x weekly - 2 sets - 10 reps - 5 seconds  hold  Access Code: [email or web address] URL: [email or web address] Date: 09/12/2023 Prepared by: Raj Blanch  Exercises -  Standing Lumbar Extension with Counter  - 2 x daily - 7 x weekly - 2 sets - 10 reps - Prone Press Up  - 1 x daily - 7 x weekly - 2 sets - 10 reps - Prone Alternating Arm and Leg Lifts  - 1 x daily - 7 x weekly - 2 sets - 10 reps - Hooklying Isometric Hip Flexion  - 1 x daily - 7 x weekly - 2 sets - 10 reps - 5 seconds hold - Half Kneeling Hip Flexor Stretch  - 1 x daily - 7 x weekly - 10 reps - 15 sec hold - Supine Double Bent Leg Lift  - 1 x daily - 7 x weekly - 3 sets - 10 reps  ASSESSMENT:  CLINICAL IMPRESSION: Pt's R hip flexors are significantly tighter compared to L. Pt reported improvement in pain and flexibility after hip flexor stretching.  Hip flexor stretching was added for HEP  OBJECTIVE IMPAIRMENTS: decreased activity tolerance, decreased endurance, decreased mobility, decreased ROM, decreased strength, hypomobility, increased fascial restrictions, increased muscle spasms, impaired flexibility, impaired sensation, postural dysfunction, and pain.   ACTIVITY LIMITATIONS: carrying, lifting, bending, standing, squatting, sleeping, stairs, transfers, and bed mobility  PARTICIPATION LIMITATIONS: meal prep, cleaning, laundry, shopping, community activity, and yard work  PERSONAL FACTORS: Time since onset of injury/illness/exacerbation are also affecting patient's functional outcome.   REHAB POTENTIAL: Good  CLINICAL DECISION MAKING: Stable/uncomplicated  EVALUATION COMPLEXITY: Low   GOALS: Goals reviewed with patient? Yes  SHORT TERM GOALS: Target date: 09/16/2023    Patient will demo 50% compliance with HEP to self manage symptoms at home.  Baseline: Initiated 08/19/23 Goal status: Goal met  LONG TERM GOALS: Target date: 10/14/2023    Patient will demo at least 10%improvement on Modified oswestery score to improve overall  function. Baseline: 40 % 08/19/23 Goal status: INITIAL  2.  Patient will report of pain of <4/10 at worst in his lumbar spine to improve overall function. Baseline: 7/10 (eval); 3/10 (09/10/23) Goal status: goal met  3.  Pt will report 50% reduction in his R lumbar radiculopathy symptoms to improve overall function.  Baseline: R LE lumbar radiculopathy is constant 08/19/23; only back pain, no more peripheral symptoms (09/10/23) Goal status: Goal met  4.  Pt will be 100% compliant with HEP to self manage his symptoms.  Baseline: 09/12/23- pt reports 100% compliance with HEP Goal status: Goal met   5.  Patient wll demo 100% lumbar AROM without pain to improve ability to bend, twist. Baseline: Limited 70% with extension, 80% with R lateral flexion, 50% with L lateral flexion (08/19/23);100% lumbar AROM without significant pain (09/12/23) Goal status: Goal met    PLAN:  PT FREQUENCY: 1-2x/week  PT DURATION: 8 weeks  PLANNED INTERVENTIONS: [id number]- PT Re-evaluation, 97750- Physical Performance Testing, 97110-Therapeutic exercises, 97530- Therapeutic activity, 97112- Neuromuscular re-education, 97535- Self Care, [id number]- Manual therapy, [id number]- Gait training, [phone]- Electrical stimulation (unattended), Patient/Family education, Joint mobilization, Joint manipulation, Spinal manipulation, Spinal mobilization, Cryotherapy, and Moist heat.  PLAN FOR NEXT SESSION: review and updated HEP   Raj LOISE Blanch, PT 09/12/2023, 9:30 AM

## 2023-09-16 ENCOUNTER — Ambulatory Visit: Payer: Self-pay

## 2023-09-16 DIAGNOSIS — M5459 Other low back pain: Secondary | ICD-10-CM

## 2023-09-16 NOTE — Therapy (Signed)
 OUTPATIENT PHYSICAL THERAPY THORACOLUMBAR EVALUATION   Patient Name: Win Guajardo MRN: [id number] DOB:01-11-1961, 63 y.o., male Today's Date: 09/16/2023  END OF SESSION:  PT End of Session - 09/16/23 1310     Visit Number 7    Number of Visits 13    Date for PT Re-Evaluation 09/23/23    Authorization Type CAFA 03/26/23 -09/23/23    PT Start Time 1315    PT Stop Time 1400    PT Time Calculation (min) 45 min    Activity Tolerance Patient tolerated treatment well    Behavior During Therapy [facility] for tasks assessed/performed             Past Medical History:  Diagnosis Date   BPH (benign prostatic hyperplasia)    Diabetes mellitus 2011   GERD (gastroesophageal reflux disease)    High triglycerides    Hypertension    Past Surgical History:  Procedure Laterality Date   COLONOSCOPY N/A 10/24/2013   Procedure: COLONOSCOPY;  Surgeon: Gwendlyn ONEIDA Buddy, MD;  Location: [facility] ENDOSCOPY;  Service: Endoscopy;  Laterality: N/A;. diverticulosis, mild, small internal hemorrhoids   CYSTOSCOPY WITH FULGERATION N/A 03/04/2022   Procedure: CYSTOSCOPY WITH FULGERATION; CLOT EVACUATION;  Surgeon: Renda Glance, MD;  Location: [facility] ORS;  Service: Urology;  Laterality: N/A;   FINGER SURGERY     right thumb   Patient Active Problem List   Diagnosis Date Noted   Gross hematuria 03/01/2022   BPH (benign prostatic hyperplasia) 03/01/2022   Obstruction of Foley catheter (HCC) 03/01/2022   UTI (urinary tract infection) 03/01/2022   Hyponatremia 03/01/2022   Normocytic anemia 03/01/2022   Uncontrolled type 2 diabetes mellitus with hyperglycemia (HCC) 07/27/2017   Essential hypertension 03/06/2016   AKI (acute kidney injury) (HCC) 03/06/2016   Uncontrolled type 2 diabetes mellitus with complication    Dyslipidemia    Type 2 diabetes mellitus with hyperglycemia (HCC) 03/05/2016   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   Dyslipidemia with low high density lipoprotein (HDL) cholesterol with  hypertriglyceridemia due to type 2 diabetes mellitus (HCC) 11/03/2013   Gall bladder polyp 10/24/2013   GERD (gastroesophageal reflux disease) 10/24/2013   Uncontrolled type 2 diabetes mellitus without complication, with long-term current use of insulin  10/23/2013    PCP: Rosaline Bohr, NP  REFERRING PROVIDER: Ulis Bottcher, PA-C  REFERRING DIAG: M54.50 (ICD-10-CM) - Lumbar pain R20.0,R20.2 (ICD-10-CM) - Numbness and tingling  Rationale for Evaluation and Treatment: Rehabilitation  THERAPY DIAG:  Other low back pain  ONSET DATE: 06/19/2023- date of referral  SUBJECTIVE:  SUBJECTIVE STATEMENT: Pt reports he was helping his friend with sheet rock and bending, twisting, cutting, he felt more pain in his back. Pt reports no radicular symptoms. C/o bil soreness in quads but doesn't appear to be radicular symptoms from LB PERTINENT HISTORY:  none  PAIN:  Are you having pain? Yes: NPRS scale: 3-4/10 Pain location: Lumbar spine, R hip, R posterior leg Pain description: radiating, sharp, achy Aggravating factors: Sit to stand, prolonged standing, sit to stand transfers, bed mobility, Relieving factors: position changes  PRECAUTIONS: None  RED FLAGS: None   WEIGHT BEARING RESTRICTIONS: No  FALLS:  Has patient fallen in last 6 months? No    OCCUPATION: disability  PLOF: Independent  PATIENT GOALS: Improve LBP  OBJECTIVE:  Note: Objective measures were completed at Evaluation unless otherwise noted.  DIAGNOSTIC FINDINGS:  IMPRESSION: 1. Grade 1 anterolisthesis of L5 on S1 without evidence of instability. 2. 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 3. Moderate L5-S1 facet hypertrophy.  PATIENT SURVEYS:  Modified Oswestry (Spanish version) 40%   COGNITION: Overall  cognitive status: Within functional limits for tasks assessed     SENSATION: Light touch: Impaired    PALPATION: Patient has left lateral shift with standing posture.   LUMBAR ROM:   AROM eval  Flexion 100%  Extension 40%  Right lateral flexion 20%  Left lateral flexion 50%  Right rotation   Left rotation    (Blank rows = not tested)  LOWER EXTREMITY ROM:     Active  Right eval Left eval  Hip flexion    Hip extension    Hip abduction    Hip adduction    Hip internal rotation    Hip external rotation    Knee flexion    Knee extension    Ankle dorsiflexion    Ankle plantarflexion    Ankle inversion    Ankle eversion     (Blank rows = not tested)  LOWER EXTREMITY MMT:    MMT Right eval Left eval  Hip flexion    Hip extension    Hip abduction    Hip adduction    Hip internal rotation    Hip external rotation    Knee flexion    Knee extension    Ankle dorsiflexion    Ankle plantarflexion    Ankle inversion    Ankle eversion     (Blank rows = not tested)   Patient surveys: Modified Oswestry: 40% (08/19/23) Modified Oswestery: 30%   TREATMENT DATE:  Pt educated on being mindful of his back when he is bending/twisting/lifting. Pt educated on bending his knee when lifting things off the floor. Grade IV PA mobs in lumbar and thoracic spine Manually stretched piriformis and hamstrings Piriformis stretch: 5 x 30 R and L Figure 4 stretch (TFL): 5 x 30 R and L Passive stretching of R hip flexors and TFL Supine ab bracing with eccentric double leg lowering: 3 x 10 Q-ped alternating UE and LE extensions: 2 x 10 R and L   PATIENT EDUCATION:  Education details: see above Person educated: Patient and with live Nurse, learning disability Education method: Medical illustrator Education comprehension: verbalized understanding  HOME EXERCISE PROGRAM: Access Code: [email or web address] URL: [email or web address] Date: 08/26/2023 Prepared by: Raj Blanch  Exercises - Standing Lumbar Extension with Counter  - 2 x daily - 7 x weekly - 2 sets - 10 reps - Prone Press Up  - 1 x daily - 7 x weekly - 2 sets - 10 reps -  Prone Alternating Arm and Leg Lifts  - 1 x daily - 7 x weekly - 2 sets - 10 reps - Hooklying Isometric Hip Flexion  - 1 x daily - 7 x weekly - 2 sets - 10 reps - 5 seconds hold  Access Code: [email or web address] URL: [email or web address] Date: 09/12/2023 Prepared by: Raj Blanch  Exercises - Standing Lumbar Extension with Counter  - 2 x daily - 7 x weekly - 2 sets - 10 reps - Prone Press Up  - 1 x daily - 7 x weekly - 2 sets - 10 reps - Prone Alternating Arm and Leg Lifts  - 1 x daily - 7 x weekly - 2 sets - 10 reps - Hooklying Isometric Hip Flexion  - 1 x daily - 7 x weekly - 2 sets - 10 reps - 5 seconds hold - Half Kneeling Hip Flexor Stretch  - 1 x daily - 7 x weekly - 10 reps - 15 sec hold - Supine Double Bent Leg Lift  - 1 x daily - 7 x weekly - 3 sets - 10 reps  Access Code: [email or web address] URL: [email or web address] Date: 09/16/2023 Prepared by: Raj Blanch  Exercises - Standing Lumbar Extension with Counter  - 2 x daily - 7 x weekly - 2 sets - 10 reps - Prone Press Up  - 1 x daily - 7 x weekly - 2 sets - 10 reps - Half Kneeling Hip Flexor Stretch  - 1 x daily - 7 x weekly - 10 reps - 15 sec hold - Supine Double Bent Leg Lift  - 1 x daily - 7 x weekly - 3 sets - 10 reps - Supine Piriformis Stretch with Foot on Ground  - 1 x daily - 7 x weekly - 5 reps - 30 hold - Supine Figure 4 Piriformis Stretch  - 1 x daily - 7 x weekly - 5 reps - 30 sec hold - Bird Dog  - 1 x daily - 7 x weekly - 2 sets - 10 reps  ASSESSMENT:  CLINICAL IMPRESSION: Updated HEP to incorporate hip stretches as patient's hip external rotators are very tight.   OBJECTIVE IMPAIRMENTS: decreased activity tolerance, decreased endurance, decreased mobility, decreased ROM, decreased strength, hypomobility, increased fascial  restrictions, increased muscle spasms, impaired flexibility, impaired sensation, postural dysfunction, and pain.   ACTIVITY LIMITATIONS: carrying, lifting, bending, standing, squatting, sleeping, stairs, transfers, and bed mobility  PARTICIPATION LIMITATIONS: meal prep, cleaning, laundry, shopping, community activity, and yard work  PERSONAL FACTORS: Time since onset of injury/illness/exacerbation are also affecting patient's functional outcome.   REHAB POTENTIAL: Good  CLINICAL DECISION MAKING: Stable/uncomplicated  EVALUATION COMPLEXITY: Low   GOALS: Goals reviewed with patient? Yes  SHORT TERM GOALS: Target date: 09/16/2023    Patient will demo 50% compliance with HEP to self manage symptoms at home.  Baseline: Initiated 08/19/23 Goal status: Goal met  LONG TERM GOALS: Target date: 10/14/2023    Patient will demo at least 10%improvement on Modified oswestery score to improve overall function. Baseline: 40 % 08/19/23 Goal status: INITIAL  2.  Patient will report of pain of <4/10 at worst in his lumbar spine to improve overall function. Baseline: 7/10 (eval); 3/10 (09/10/23) Goal status: goal met  3.  Pt will report 50% reduction in his R lumbar radiculopathy symptoms to improve overall function.  Baseline: R LE lumbar radiculopathy is constant 08/19/23; only back pain, no more peripheral symptoms (09/10/23) Goal status: Goal met  4.  Pt will be 100% compliant  with HEP to self manage his symptoms.  Baseline: 09/12/23- pt reports 100% compliance with HEP Goal status: Goal met   5.  Patient wll demo 100% lumbar AROM without pain to improve ability to bend, twist. Baseline: Limited 70% with extension, 80% with R lateral flexion, 50% with L lateral flexion (08/19/23);100% lumbar AROM without significant pain (09/12/23) Goal status: Goal met    PLAN:  PT FREQUENCY: 1-2x/week  PT DURATION: 8 weeks  PLANNED INTERVENTIONS: 97164- PT Re-evaluation, 97750- Physical Performance  Testing, 97110-Therapeutic exercises, 97530- Therapeutic activity, 97112- Neuromuscular re-education, 97535- Self Care, [id number]- Manual therapy, [id number]- Gait training, [phone]- Electrical stimulation (unattended), Patient/Family education, Joint mobilization, Joint manipulation, Spinal manipulation, Spinal mobilization, Cryotherapy, and Moist heat.  PLAN FOR NEXT SESSION: review and updated HEP   Raj LOISE Blanch, PT 09/16/2023, 1:21 PM  OUTPATIENT PHYSICAL THERAPY THORACOLUMBAR EVALUATION   Patient Name: Mitcheal Sweetin MRN: [id number] DOB:16-May-1960, 63 y.o., male Today's Date: 09/16/2023  END OF SESSION:  PT End of Session - 09/16/23 1310     Visit Number 7    Number of Visits 13    Date for PT Re-Evaluation 09/23/23    Authorization Type CAFA 03/26/23 -09/23/23    PT Start Time 1315    PT Stop Time 1400    PT Time Calculation (min) 45 min    Activity Tolerance Patient tolerated treatment well    Behavior During Therapy [facility] for tasks assessed/performed             Past Medical History:  Diagnosis Date   BPH (benign prostatic hyperplasia)    Diabetes mellitus 2011   GERD (gastroesophageal reflux disease)    High triglycerides    Hypertension    Past Surgical History:  Procedure Laterality Date   COLONOSCOPY N/A 10/24/2013   Procedure: COLONOSCOPY;  Surgeon: Gwendlyn ONEIDA Buddy, MD;  Location: [facility] ENDOSCOPY;  Service: Endoscopy;  Laterality: N/A;. diverticulosis, mild, small internal hemorrhoids   CYSTOSCOPY WITH FULGERATION N/A 03/04/2022   Procedure: CYSTOSCOPY WITH FULGERATION; CLOT EVACUATION;  Surgeon: Renda Glance, MD;  Location: [facility] ORS;  Service: Urology;  Laterality: N/A;   FINGER SURGERY     right thumb   Patient Active Problem List   Diagnosis Date Noted   Gross hematuria 03/01/2022   BPH (benign prostatic hyperplasia) 03/01/2022   Obstruction of Foley catheter (HCC) 03/01/2022   UTI (urinary tract infection) 03/01/2022   Hyponatremia 03/01/2022    Normocytic anemia 03/01/2022   Uncontrolled type 2 diabetes mellitus with hyperglycemia (HCC) 07/27/2017   Essential hypertension 03/06/2016   AKI (acute kidney injury) (HCC) 03/06/2016   Uncontrolled type 2 diabetes mellitus with complication    Dyslipidemia    Type 2 diabetes mellitus with hyperglycemia (HCC) 03/05/2016   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   Dyslipidemia with low high density lipoprotein (HDL) cholesterol with hypertriglyceridemia due to type 2 diabetes mellitus (HCC) 11/03/2013   Gall bladder polyp 10/24/2013   GERD (gastroesophageal reflux disease) 10/24/2013   Uncontrolled type 2 diabetes mellitus without complication, with long-term current use of insulin  10/23/2013    PCP: Rosaline Bohr, NP  REFERRING PROVIDER: Ulis Bottcher, PA-C  REFERRING DIAG: M54.50 (ICD-10-CM) - Lumbar pain R20.0,R20.2 (ICD-10-CM) - Numbness and tingling  Rationale for Evaluation and Treatment: Rehabilitation  THERAPY DIAG:  Other low back pain  ONSET DATE: 06/19/2023- date of referral  SUBJECTIVE:  SUBJECTIVE STATEMENT: Pt reports pain is better. There is only mild pain in loewr back. No radicular symptoms  PERTINENT HISTORY:  none  PAIN:  Are you having pain? Yes: NPRS scale: 1-2/10 Pain location: Lumbar spine, R hip, R posterior leg Pain description: radiating, sharp, achy Aggravating factors: Sit to stand, prolonged standing, sit to stand transfers, bed mobility, Relieving factors: position changes  PRECAUTIONS: None  RED FLAGS: None   WEIGHT BEARING RESTRICTIONS: No  FALLS:  Has patient fallen in last 6 months? No    OCCUPATION: disability  PLOF: Independent  PATIENT GOALS: Improve LBP  OBJECTIVE:  Note: Objective measures were completed at Evaluation unless otherwise  noted.  DIAGNOSTIC FINDINGS:  IMPRESSION: 1. Grade 1 anterolisthesis of L5 on S1 without evidence of instability. 2. 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 3. Moderate L5-S1 facet hypertrophy.  PATIENT SURVEYS:  Modified Oswestry (Spanish version) 40%   COGNITION: Overall cognitive status: Within functional limits for tasks assessed     SENSATION: Light touch: Impaired    PALPATION: Patient has left lateral shift with standing posture.   LUMBAR ROM:   AROM eval  Flexion 100%  Extension 40%  Right lateral flexion 20%  Left lateral flexion 50%  Right rotation   Left rotation    (Blank rows = not tested)  LOWER EXTREMITY ROM:     Active  Right eval Left eval  Hip flexion    Hip extension    Hip abduction    Hip adduction    Hip internal rotation    Hip external rotation    Knee flexion    Knee extension    Ankle dorsiflexion    Ankle plantarflexion    Ankle inversion    Ankle eversion     (Blank rows = not tested)  LOWER EXTREMITY MMT:    MMT Right eval Left eval  Hip flexion    Hip extension    Hip abduction    Hip adduction    Hip internal rotation    Hip external rotation    Knee flexion    Knee extension    Ankle dorsiflexion    Ankle plantarflexion    Ankle inversion    Ankle eversion     (Blank rows = not tested)   Patient surveys: Modified Oswestry: 40% (08/19/23) Modified Oswestery: 30%   TREATMENT DATE:   Quad length WNL, R hip flexors felt tight. STM to L iliopsoas group Manually stretched  R and L hip flexors in sidelying position. Manually stretched bil TFL Lumbar AROM: 100% in all planes without significant pain Kneeling hip flexor stretch: 10 x 15 sec holds R and L  PATIENT EDUCATION:  Education details: see above Person educated: Patient and with live Nurse, learning disability Education method: Medical illustrator Education comprehension: verbalized understanding  HOME EXERCISE PROGRAM: Access Code:  [email or web address] URL: [email or web address] Date: 08/26/2023 Prepared by: Raj Blanch  Exercises - Standing Lumbar Extension with Counter  - 2 x daily - 7 x weekly - 2 sets - 10 reps - Prone Press Up  - 1 x daily - 7 x weekly - 2 sets - 10 reps - Prone Alternating Arm and Leg Lifts  - 1 x daily - 7 x weekly - 2 sets - 10 reps - Hooklying Isometric Hip Flexion  - 1 x daily - 7 x weekly - 2 sets - 10 reps - 5 seconds hold  Access Code: [email or web address] URL: [email or web address] Date: 09/12/2023 Prepared by: Raj Blanch  Exercises -  Standing Lumbar Extension with Counter  - 2 x daily - 7 x weekly - 2 sets - 10 reps - Prone Press Up  - 1 x daily - 7 x weekly - 2 sets - 10 reps - Prone Alternating Arm and Leg Lifts  - 1 x daily - 7 x weekly - 2 sets - 10 reps - Hooklying Isometric Hip Flexion  - 1 x daily - 7 x weekly - 2 sets - 10 reps - 5 seconds hold - Half Kneeling Hip Flexor Stretch  - 1 x daily - 7 x weekly - 10 reps - 15 sec hold - Supine Double Bent Leg Lift  - 1 x daily - 7 x weekly - 3 sets - 10 reps  ASSESSMENT:  CLINICAL IMPRESSION: Pt's R hip flexors are significantly tighter compared to L. Pt reported improvement in pain and flexibility after hip flexor stretching.  Hip flexor stretching was added for HEP  OBJECTIVE IMPAIRMENTS: decreased activity tolerance, decreased endurance, decreased mobility, decreased ROM, decreased strength, hypomobility, increased fascial restrictions, increased muscle spasms, impaired flexibility, impaired sensation, postural dysfunction, and pain.   ACTIVITY LIMITATIONS: carrying, lifting, bending, standing, squatting, sleeping, stairs, transfers, and bed mobility  PARTICIPATION LIMITATIONS: meal prep, cleaning, laundry, shopping, community activity, and yard work  PERSONAL FACTORS: Time since onset of injury/illness/exacerbation are also affecting patient's functional outcome.   REHAB POTENTIAL: Good  CLINICAL DECISION  MAKING: Stable/uncomplicated  EVALUATION COMPLEXITY: Low   GOALS: Goals reviewed with patient? Yes  SHORT TERM GOALS: Target date: 09/16/2023    Patient will demo 50% compliance with HEP to self manage symptoms at home.  Baseline: Initiated 08/19/23 Goal status: Goal met  LONG TERM GOALS: Target date: 10/14/2023    Patient will demo at least 10%improvement on Modified oswestery score to improve overall function. Baseline: 40 % 08/19/23 Goal status: INITIAL  2.  Patient will report of pain of <4/10 at worst in his lumbar spine to improve overall function. Baseline: 7/10 (eval); 3/10 (09/10/23) Goal status: goal met  3.  Pt will report 50% reduction in his R lumbar radiculopathy symptoms to improve overall function.  Baseline: R LE lumbar radiculopathy is constant 08/19/23; only back pain, no more peripheral symptoms (09/10/23) Goal status: Goal met  4.  Pt will be 100% compliant with HEP to self manage his symptoms.  Baseline: 09/12/23- pt reports 100% compliance with HEP Goal status: Goal met   5.  Patient wll demo 100% lumbar AROM without pain to improve ability to bend, twist. Baseline: Limited 70% with extension, 80% with R lateral flexion, 50% with L lateral flexion (08/19/23);100% lumbar AROM without significant pain (09/12/23) Goal status: Goal met    PLAN:  PT FREQUENCY: 1-2x/week  PT DURATION: 8 weeks  PLANNED INTERVENTIONS: 97164- PT Re-evaluation, 97750- Physical Performance Testing, 97110-Therapeutic exercises, 97530- Therapeutic activity, 97112- Neuromuscular re-education, 97535- Self Care, 02859- Manual therapy, [id number]- Gait training, [phone]- Electrical stimulation (unattended), Patient/Family education, Joint mobilization, Joint manipulation, Spinal manipulation, Spinal mobilization, Cryotherapy, and Moist heat.  PLAN FOR NEXT SESSION: review and updated HEP   Raj LOISE Blanch, PT 09/16/2023, 1:21 PM

## 2023-09-18 ENCOUNTER — Ambulatory Visit: Payer: Self-pay

## 2023-09-18 DIAGNOSIS — M5459 Other low back pain: Secondary | ICD-10-CM

## 2023-09-18 NOTE — Therapy (Signed)
 OUTPATIENT PHYSICAL THERAPY THORACOLUMBAR EVALUATION   Patient Name: Joseph Roberts MRN: [id number] DOB:10-Feb-1961, 63 y.o., male Today's Date: 09/18/2023  END OF SESSION:  PT End of Session - 09/18/23 1303     Visit Number 8    Number of Visits 13    Date for PT Re-Evaluation 09/23/23    Authorization Type CAFA 03/26/23 -09/23/23    PT Start Time 1305    PT Stop Time 1345    PT Time Calculation (min) 40 min    Activity Tolerance Patient tolerated treatment well    Behavior During Therapy [facility] for tasks assessed/performed              Past Medical History:  Diagnosis Date   BPH (benign prostatic hyperplasia)    Diabetes mellitus 2011   GERD (gastroesophageal reflux disease)    High triglycerides    Hypertension    Past Surgical History:  Procedure Laterality Date   COLONOSCOPY N/A 10/24/2013   Procedure: COLONOSCOPY;  Surgeon: Gwendlyn ONEIDA Buddy, MD;  Location: [facility] ENDOSCOPY;  Service: Endoscopy;  Laterality: N/A;. diverticulosis, mild, small internal hemorrhoids   CYSTOSCOPY WITH FULGERATION N/A 03/04/2022   Procedure: CYSTOSCOPY WITH FULGERATION; CLOT EVACUATION;  Surgeon: Renda Glance, MD;  Location: [facility] ORS;  Service: Urology;  Laterality: N/A;   FINGER SURGERY     right thumb   Patient Active Problem List   Diagnosis Date Noted   Gross hematuria 03/01/2022   BPH (benign prostatic hyperplasia) 03/01/2022   Obstruction of Foley catheter (HCC) 03/01/2022   UTI (urinary tract infection) 03/01/2022   Hyponatremia 03/01/2022   Normocytic anemia 03/01/2022   Uncontrolled type 2 diabetes mellitus with hyperglycemia (HCC) 07/27/2017   Essential hypertension 03/06/2016   AKI (acute kidney injury) (HCC) 03/06/2016   Uncontrolled type 2 diabetes mellitus with complication    Dyslipidemia    Type 2 diabetes mellitus with hyperglycemia (HCC) 03/05/2016   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   Dyslipidemia with low high density lipoprotein (HDL) cholesterol with  hypertriglyceridemia due to type 2 diabetes mellitus (HCC) 11/03/2013   Gall bladder polyp 10/24/2013   GERD (gastroesophageal reflux disease) 10/24/2013   Uncontrolled type 2 diabetes mellitus without complication, with long-term current use of insulin  10/23/2013    PCP: Rosaline Bohr, NP  REFERRING PROVIDER: Ulis Bottcher, PA-C  REFERRING DIAG: M54.50 (ICD-10-CM) - Lumbar pain R20.0,R20.2 (ICD-10-CM) - Numbness and tingling  Rationale for Evaluation and Treatment: Rehabilitation  THERAPY DIAG:  Other low back pain  ONSET DATE: 06/19/2023- date of referral  SUBJECTIVE:  SUBJECTIVE STATEMENT: Pt reports pain is better. Currently at 2/10 PERTINENT HISTORY:  none  PAIN:  Are you having pain? Yes: NPRS scale: 2/10 Pain location: Lumbar spine, R hip, R posterior leg Pain description: radiating, sharp, achy Aggravating factors: Sit to stand, prolonged standing, sit to stand transfers, bed mobility, Relieving factors: position changes  PRECAUTIONS: None  RED FLAGS: None   WEIGHT BEARING RESTRICTIONS: No  FALLS:  Has patient fallen in last 6 months? No    OCCUPATION: disability  PLOF: Independent  PATIENT GOALS: Improve LBP  OBJECTIVE:  Note: Objective measures were completed at Evaluation unless otherwise noted.  DIAGNOSTIC FINDINGS:  IMPRESSION: 1. Grade 1 anterolisthesis of L5 on S1 without evidence of instability. 2. 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 3. Moderate L5-S1 facet hypertrophy.  PATIENT SURVEYS:  Modified Oswestry (Spanish version) 40%   COGNITION: Overall cognitive status: Within functional limits for tasks assessed     SENSATION: Light touch: Impaired    PALPATION: Patient has left lateral shift with standing posture.   LUMBAR  ROM:   AROM eval  Flexion 100%  Extension 40%  Right lateral flexion 20%  Left lateral flexion 50%  Right rotation   Left rotation    (Blank rows = not tested)  LOWER EXTREMITY ROM:     Active  Right eval Left eval  Hip flexion    Hip extension    Hip abduction    Hip adduction    Hip internal rotation    Hip external rotation    Knee flexion    Knee extension    Ankle dorsiflexion    Ankle plantarflexion    Ankle inversion    Ankle eversion     (Blank rows = not tested)  LOWER EXTREMITY MMT:    MMT Right eval Left eval  Hip flexion    Hip extension    Hip abduction    Hip adduction    Hip internal rotation    Hip external rotation    Knee flexion    Knee extension    Ankle dorsiflexion    Ankle plantarflexion    Ankle inversion    Ankle eversion     (Blank rows = not tested)   Patient surveys: Modified Oswestry: 40% (08/19/23) Modified Oswestery: 30% (09/16/23)   TREATMENT DATE:  Reviewed final HEP as below. Pt educated to do these exercises every day for next few weeks and then he can choose to do them 3-4x/week. Pt educated on press ups and standing lumbar extensions should be done daily regardless to maintain extension mobility in lumbar spine Standing Lumbar Extension with Counter  - 2 x daily - 7 x weekly - 2 sets - 10 reps - Prone Press Up  - 1 x daily - 7 x weekly - 2 sets - 10 reps - Half Kneeling Hip Flexor Stretch  - 1 x daily - 7 x weekly - 10 reps - 15 sec hold - Supine Double Bent Leg Lift  - 1 x daily - 7 x weekly - 3 sets - 10 reps - Supine Piriformis Stretch with Foot on Ground  - 1 x daily - 7 x weekly - 5 reps - 30 hold - Supine Figure 4 Piriformis Stretch  - 1 x daily - 7 x weekly - 5 reps - 30 sec hold - Bird Dog  - 1 x daily - 7 x weekly - 2 sets - 10 reps   PATIENT EDUCATION:  Education details: see above  Person educated: Patient and with live translator Education method: Medical illustrator Education comprehension:  verbalized understanding  HOME EXERCISE PROGRAM: Access Code: [email or web address] URL: [email or web address] Date: 08/26/2023 Prepared by: Raj Blanch  Exercises - Standing Lumbar Extension with Counter  - 2 x daily - 7 x weekly - 2 sets - 10 reps - Prone Press Up  - 1 x daily - 7 x weekly - 2 sets - 10 reps - Prone Alternating Arm and Leg Lifts  - 1 x daily - 7 x weekly - 2 sets - 10 reps - Hooklying Isometric Hip Flexion  - 1 x daily - 7 x weekly - 2 sets - 10 reps - 5 seconds hold  Access Code: [email or web address] URL: [email or web address] Date: 09/12/2023 Prepared by: Raj Blanch  Exercises - Standing Lumbar Extension with Counter  - 2 x daily - 7 x weekly - 2 sets - 10 reps - Prone Press Up  - 1 x daily - 7 x weekly - 2 sets - 10 reps - Prone Alternating Arm and Leg Lifts  - 1 x daily - 7 x weekly - 2 sets - 10 reps - Hooklying Isometric Hip Flexion  - 1 x daily - 7 x weekly - 2 sets - 10 reps - 5 seconds hold - Half Kneeling Hip Flexor Stretch  - 1 x daily - 7 x weekly - 10 reps - 15 sec hold - Supine Double Bent Leg Lift  - 1 x daily - 7 x weekly - 3 sets - 10 reps  Access Code: [email or web address] URL: [email or web address] Date: 09/16/2023 Prepared by: Raj Blanch  Exercises - Standing Lumbar Extension with Counter  - 2 x daily - 7 x weekly - 2 sets - 10 reps - Prone Press Up  - 1 x daily - 7 x weekly - 2 sets - 10 reps - Half Kneeling Hip Flexor Stretch  - 1 x daily - 7 x weekly - 10 reps - 15 sec hold - Supine Double Bent Leg Lift  - 1 x daily - 7 x weekly - 3 sets - 10 reps - Supine Piriformis Stretch with Foot on Ground  - 1 x daily - 7 x weekly - 5 reps - 30 hold - Supine Figure 4 Piriformis Stretch  - 1 x daily - 7 x weekly - 5 reps - 30 sec hold - Bird Dog  - 1 x daily - 7 x weekly - 2 sets - 10 reps  ASSESSMENT:  CLINICAL IMPRESSION: Patient has been seen for total of 8 sessions for lower back pain with R LE radiculpathy. Pt currently  reports no radicular symptoms in R LE and reports of only mild pain in lumbar spine that is managed with HEP. Patient has met all of his LTG and will be discharged from skilled PT.  OBJECTIVE IMPAIRMENTS: decreased activity tolerance, decreased endurance, decreased mobility, decreased ROM, decreased strength, hypomobility, increased fascial restrictions, increased muscle spasms, impaired flexibility, impaired sensation, postural dysfunction, and pain.   ACTIVITY LIMITATIONS: carrying, lifting, bending, standing, squatting, sleeping, stairs, transfers, and bed mobility  PARTICIPATION LIMITATIONS: meal prep, cleaning, laundry, shopping, community activity, and yard work  PERSONAL FACTORS: Time since onset of injury/illness/exacerbation are also affecting patient's functional outcome.   REHAB POTENTIAL: Good  CLINICAL DECISION MAKING: Stable/uncomplicated  EVALUATION COMPLEXITY: Low   GOALS: Goals reviewed with patient? Yes  SHORT TERM GOALS: Target date: 09/16/2023    Patient will demo 50% compliance with HEP to self manage symptoms at home.  Baseline: Initiated 08/19/23 Goal status: Goal met  LONG TERM GOALS: Target date: 10/14/2023    Patient will demo at least 10%improvement on Modified oswestery score to improve overall function. Baseline: 40 % 08/19/23; 30% (09/16/23) Goal status: goal met  2.  Patient will report of pain of <4/10 at worst in his lumbar spine to improve overall function. Baseline: 7/10 (eval); 3/10 (09/10/23) Goal status: goal met  3.  Pt will report 50% reduction in his R lumbar radiculopathy symptoms to improve overall function.  Baseline: R LE lumbar radiculopathy is constant 08/19/23; only back pain, no more peripheral symptoms (09/10/23) Goal status: Goal met  4.  Pt will be 100% compliant with HEP to self manage his symptoms.  Baseline: 09/12/23- pt reports 100% compliance with HEP Goal status: Goal met   5.  Patient wll demo 100% lumbar AROM without  pain to improve ability to bend, twist. Baseline: Limited 70% with extension, 80% with R lateral flexion, 50% with L lateral flexion (08/19/23);100% lumbar AROM without significant pain (09/12/23) Goal status: Goal met    PLAN: Discharge from skilled PT with HEP    Raj LOISE Blanch, PT 09/18/2023, 1:23 PM

## 2023-09-20 ENCOUNTER — Telehealth: Payer: Self-pay | Admitting: Primary Care

## 2023-09-20 NOTE — Telephone Encounter (Signed)
 Called patient to confirm upcoming appointment 09/24/2023 at 8:30 am with Clinical Pharmacist. Patient appointment has been successfully confirmed.

## 2023-09-24 ENCOUNTER — Ambulatory Visit: Payer: Self-pay | Attending: Family Medicine | Admitting: Pharmacist

## 2023-09-24 ENCOUNTER — Other Ambulatory Visit: Payer: Self-pay

## 2023-09-24 DIAGNOSIS — Z760 Encounter for issue of repeat prescription: Secondary | ICD-10-CM

## 2023-09-24 DIAGNOSIS — Z7985 Long-term (current) use of injectable non-insulin antidiabetic drugs: Secondary | ICD-10-CM

## 2023-09-24 DIAGNOSIS — Z794 Long term (current) use of insulin: Secondary | ICD-10-CM

## 2023-09-24 DIAGNOSIS — E1165 Type 2 diabetes mellitus with hyperglycemia: Secondary | ICD-10-CM

## 2023-09-24 DIAGNOSIS — Z7984 Long term (current) use of oral hypoglycemic drugs: Secondary | ICD-10-CM

## 2023-09-24 LAB — POCT GLYCOSYLATED HEMOGLOBIN (HGB A1C): HbA1c, POC (controlled diabetic range): 12.3 % — AB (ref 0.0–7.0)

## 2023-09-24 MED ORDER — BASAGLAR KWIKPEN 100 UNIT/ML ~~LOC~~ SOPN
48.0000 [IU] | PEN_INJECTOR | Freq: Every day | SUBCUTANEOUS | 2 refills | Status: DC
  Filled 2023-09-24 – 2023-10-29 (×2): qty 15, 31d supply, fill #0
  Filled 2023-12-04: qty 15, 31d supply, fill #1
  Filled 2023-12-25: qty 15, 31d supply, fill #2

## 2023-09-24 MED ORDER — INSULIN LISPRO (1 UNIT DIAL) 100 UNIT/ML (KWIKPEN)
20.0000 [IU] | PEN_INJECTOR | Freq: Three times a day (TID) | SUBCUTANEOUS | 2 refills | Status: DC
  Filled 2023-09-24: qty 18, 30d supply, fill #0
  Filled 2023-10-29: qty 18, 30d supply, fill #1
  Filled 2023-12-04: qty 18, 30d supply, fill #2

## 2023-09-24 NOTE — Progress Notes (Signed)
 S:     63 y.o. male who presents for diabetes evaluation, education, and management. PMH is significant for BPH, diabetes mellitus (2011), HLD, and HTN.   Patient was last seen by Rosaline on 06/27/2023 and last seen by pharmacist on 08/20/2023. At his visit with Rosaline, A1c was found to be down to 10.7 (down from 13.0 prior). At that visit with Rosaline, he denied any symptoms of hyperglycemia but admitted that he was not checking blood sugar levels at home.  Regarding his last visit with me, he told me on 08/20/23 that he was doing better with adherence and dispense history showed improvement. I was unable to verify his home sugar readings but he reported 140s-180s fasting. He reported PPD levels seldomly checked and were mostly in the high 200s when he did get around to checking.  Today, patient arrives in good spirits. [address] [place], [place] # [phone]) used. Regarding his DM, he is doing a better job with adherence since seeing me in June but adherence is still not 100%. His refill history indicates improved adherence with Basaglar , Trulicity , and metformin , however, he has not filled Humalog  since 05/2023. Of note, he is tolerating the increased Trulicity  dose well. Denies any NV, abdominal pain. No changes in vision. Despite improved adherence, his A1c today is 12.3 (up from 10.7 prior).  Family/Social History:  - FHx: diabetes (mother, brother, sister) - Tobacco Use: Current -15 pack-year smoking history   Current diabetes medications include:  - Basaglar  40 units daily (last filled 31-day supply 09/03/23) - Humalog  14 units BID (last filled 05/28/23 for a 32 day supply) - Trulicity  3 mg weekly (last filled 08/20/23 for a 28 day supply) - Metformin  1000 mg XR BID - takes two 500 mg tabs BID (last filled 08/09/2023 for a 30 day supply)  Insurance coverage: Self-Pay  Patient denies hypoglycemic events.  Reported home fasting blood sugars:  -Since last visit with me, blood sugars  are 140 - 180.  -Post-prandial/random: has noticed notice sugars in the 200s.  Patient denies polyuria. 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 Patient denies visual changes. Patient reports self foot exams.   Patient reported dietary habits: - Reports cutting back on carbs and tortillas; eats ~ 2 tortillas a day - No changes in diet  - Usually only eats 1 meal daily at lunch    Patient-reported exercise habits:  - Walks 10-20 minutes a day at the park - Reports he is still walking daily   O:   Lab Results  Component Value Date   HGBA1C 12.3 (A) 09/24/2023   There were no vitals filed for this visit.  Lipid Panel     Component Value Date/Time   CHOL 185 04/22/2023 1037   TRIG 466 (H) 04/22/2023 1037   HDL 32 (L) 04/22/2023 1037   CHOLHDL 5.8 (H) 04/22/2023 1037   CHOLHDL 4.7 03/07/2016 1046   VLDL 61 (H) 03/07/2016 1046   LDLCALC 78 04/22/2023 1037    Clinical Atherosclerotic Cardiovascular Disease (ASCVD): No  The 10-year ASCVD risk score (Arnett DK, et al., 2019) is: 36.8%   Values used to calculate the score:     Age: 10 years     Clincally relevant sex: Male     Is Non-Hispanic African American: No     Diabetic: Yes     Tobacco smoker: Yes     Systolic Blood Pressure: 126 mmHg     Is BP treated: Yes     HDL  Cholesterol: 32 mg/dL     Total Cholesterol: 185 mg/dL   Patient is participating in a Managed Medicaid Plan: No   A/P: Diabetes longstanding, currently uncontrolled. A1c is now uptrending and I don't think he is being completely honest with his Humalog  adherence. He is requesting refills of this and his Trulicity  today. He is not hypoglycemic and is able to verbalize appropriate hypoglycemia management plan. -Increase Basaglar  to 48 units at night -Increase Humalog  to 20 units BID before breakfast and lunch (he does not eat dinner).  -Continue Trulicity  to 3 mg weekly (on Fridays). -Continue metformin  1000 mg XR BID (takes two 500 mg tabs  BID) -Patient educated on purpose, proper use, and potential adverse effects of medications.  -Extensively discussed pathophysiology of diabetes, recommended lifestyle interventions, dietary effects on blood sugar control.  -Counseled on s/sx of and management of hypoglycemia.  -A1c anticipated 09/2023  Written patient instructions provided. Patient verbalized understanding of treatment plan.  Total time in face to face counseling 30 minutes.    Follow-up:  Me: 4 weeks  Herlene Fleeta Morris, PharmD, [place], CPP Clinical Pharmacist [facility] [phone]

## 2023-09-26 ENCOUNTER — Other Ambulatory Visit: Payer: Self-pay

## 2023-09-26 MED ORDER — TADALAFIL 10 MG PO TABS
10.0000 mg | ORAL_TABLET | ORAL | 0 refills | Status: DC | PRN
  Filled 2023-09-26: qty 30, 30d supply, fill #0

## 2023-09-26 MED ORDER — CIPROFLOXACIN HCL 500 MG PO TABS
500.0000 mg | ORAL_TABLET | Freq: Every morning | ORAL | 0 refills | Status: DC
  Filled 2023-09-26: qty 3, 3d supply, fill #0

## 2023-10-01 ENCOUNTER — Telehealth (INDEPENDENT_AMBULATORY_CARE_PROVIDER_SITE_OTHER): Payer: Self-pay | Admitting: Primary Care

## 2023-10-01 NOTE — Telephone Encounter (Signed)
 Called pt to confirm appt. Pt will be present.

## 2023-10-02 ENCOUNTER — Ambulatory Visit (INDEPENDENT_AMBULATORY_CARE_PROVIDER_SITE_OTHER): Payer: Self-pay | Admitting: Primary Care

## 2023-10-02 ENCOUNTER — Encounter (INDEPENDENT_AMBULATORY_CARE_PROVIDER_SITE_OTHER): Payer: Self-pay | Admitting: Primary Care

## 2023-10-02 VITALS — BP 125/81 | HR 58 | Resp 16 | Wt 156.2 lb

## 2023-10-02 DIAGNOSIS — Z7985 Long-term (current) use of injectable non-insulin antidiabetic drugs: Secondary | ICD-10-CM

## 2023-10-02 DIAGNOSIS — Z794 Long term (current) use of insulin: Secondary | ICD-10-CM

## 2023-10-02 DIAGNOSIS — E782 Mixed hyperlipidemia: Secondary | ICD-10-CM

## 2023-10-02 DIAGNOSIS — E1169 Type 2 diabetes mellitus with other specified complication: Secondary | ICD-10-CM

## 2023-10-02 DIAGNOSIS — Z2821 Immunization not carried out because of patient refusal: Secondary | ICD-10-CM

## 2023-10-02 DIAGNOSIS — Z7984 Long term (current) use of oral hypoglycemic drugs: Secondary | ICD-10-CM

## 2023-10-02 DIAGNOSIS — E1165 Type 2 diabetes mellitus with hyperglycemia: Secondary | ICD-10-CM

## 2023-10-02 DIAGNOSIS — I10 Essential (primary) hypertension: Secondary | ICD-10-CM

## 2023-10-02 DIAGNOSIS — Z1211 Encounter for screening for malignant neoplasm of colon: Secondary | ICD-10-CM

## 2023-10-02 NOTE — Patient Instructions (Signed)
 Diabetes mellitus y nutricin, en adultos Diabetes Mellitus and Nutrition, Adult Si sufre de diabetes, o diabetes mellitus, es muy importante tener hbitos alimenticios saludables debido a que sus niveles de Psychologist, counselling sangre (glucosa) se ven afectados en gran medida por lo que come y bebe. Comer alimentos saludables en las cantidades correctas, aproximadamente a la misma hora todos los [place], [place] ayudar a: Chief Operating Officer su glucemia. Disminuir el riesgo de sufrir una enfermedad cardaca. Mejorar la presin arterial. Barista o mantener un peso saludable. Qu puede afectar mi plan de alimentacin? Todas las personas que sufren de diabetes son diferentes y cada una tiene necesidades diferentes en cuanto a un plan de alimentacin. El mdico puede recomendarle que trabaje con un nutricionista para elaborar el mejor plan para usted. Su plan de alimentacin puede variar segn factores como: Las caloras que necesita. Los medicamentos que toma. Su peso. Sus niveles de glucemia, presin arterial y colesterol. Su nivel de [place]. Otras afecciones que tenga, como enfermedades cardacas o renales. Cmo me afectan los carbohidratos? Los carbohidratos, o hidratos de carbono, afectan su nivel de glucemia ms que cualquier otro tipo de alimento. La ingesta de carbohidratos aumenta la cantidad de [employer]. Es importante conocer la cantidad de carbohidratos que se pueden ingerir en cada comida sin correr Surveyor, minerals. Esto es Government social research officer. Su nutricionista puede ayudarlo a calcular la cantidad de carbohidratos que debe ingerir en cada comida y en cada refrigerio. Cmo me afecta el alcohol? El alcohol puede provocar una disminucin de la glucemia (hipoglucemia), especialmente si [place] insulina o toma determinados medicamentos por va oral para la diabetes. La hipoglucemia es una afeccin potencialmente mortal. Los sntomas de la hipoglucemia, como somnolencia, mareos y confusin, son  similares a los sntomas de haber consumido demasiado alcohol. No beba alcohol si: Su mdico le indica no hacerlo. Est embarazada, puede estar embarazada o est tratando de [place]. Si bebe alcohol: Limite la cantidad que bebe a lo siguiente: De 0 a 1 medida por da para las mujeres. De 0 a 2 medidas por da para los hombres. Sepa cunta cantidad de alcohol hay en las bebidas que toma. En los [address], una medida equivale a una botella de cerveza de 12 oz (355 ml), un vaso de vino de 5 oz (148 ml) o un vaso de una bebida alcohlica de alta graduacin de 1 oz (44 ml). Mantngase hidratado bebiendo agua, refrescos dietticos o t helado sin azcar. Tenga en cuenta que los refrescos comunes, los jugos y otras bebidas para mezclar pueden contener Product/process development scientist y se deben contar como carbohidratos. Consejos para seguir Social worker las etiquetas de los alimentos Comience por leer el tamao de la porcin en la etiqueta de Informacin nutricional de los alimentos envasados y las bebidas. La cantidad de caloras, carbohidratos, grasas y otros nutrientes detallados en la etiqueta se basan en una porcin del alimento. Muchos alimentos contienen ms de una porcin por envase. Verifique la cantidad total de gramos (g) de carbohidratos totales en una porcin. Verifique la cantidad de gramos de grasas saturadas y grasas trans en una porcin. Escoja alimentos que no contengan estas grasas o que su contenido de estas sea [place]. Verifique la cantidad de miligramos (mg) de sal (sodio) en una porcin. La [employer] de las personas deben limitar la ingesta de sodio total a menos de 2300 mg Google. Siempre consulte la informacin nutricional de los alimentos etiquetados como "con bajo contenido de grasa" o "sin grasa".  Estos alimentos pueden tener un mayor contenido de International aid/development worker agregada o carbohidratos refinados, y deben evitarse. Hable con su nutricionista para identificar sus objetivos diarios en  cuanto a los nutrientes mencionados en la etiqueta. Al ir de compras Evite comprar alimentos procesados, enlatados o precocidos. Estos alimentos tienden a Counselling psychologist mayor cantidad de [place], sodio y azcar agregada. Compre en la zona exterior de la tienda de comestibles. Esta es la zona donde se encuentran con mayor frecuencia las frutas y las verduras frescas, los cereales a granel, las carnes frescas y los productos lcteos frescos. Al cocinar Use mtodos de coccin a baja temperatura, como hornear, en lugar de mtodos de coccin a alta temperatura, como frer en abundante aceite. Cocine con aceites saludables, como el aceite de [place], canola o [place]. Evite cocinar con manteca, crema o carnes con alto contenido de grasa. Planificacin de las comidas Coma las comidas y los refrigerios regularmente, preferentemente a la misma hora todos [place]. Evite pasar largos perodos de tiempo sin comer. Consuma alimentos ricos en fibra, como frutas frescas, verduras, frijoles y cereales integrales. Consuma entre 4 y 6 onzas (entre 112 y 168 g) de protenas magras por da, como carnes [place], pollo, pescado, huevos o tofu. Una onza (oz) (28 g) de protena magra equivale a: 1 onza (28 g) de carne, pollo o pescado. 1 huevo.  taza (62 g) de tofu. Coma algunos alimentos por da que contengan grasas saludables, como aguacates, frutos secos, semillas y pescado. Qu alimentos debo comer? Nils Pyle Bayas. Manzanas. Naranjas. Duraznos. Damascos. Ciruelas. Uvas. Mangos. Papayas. Granadas. Kiwi. Cerezas. Verduras Verduras de Mar[employer]ott, que incluyen [place], [place], col rizada, acelga, hojas de berza, hojas de mostaza y repollo. Remolachas. Coliflor. Brcoli. Zanahorias. Judas verdes. Tomates. Pimientos. Cebollas. Pepinos. Coles de Bruselas. Granos Granos integrales, como panes, galletas, tortillas, cereales y pastas de salvado o integrales. Avena sin azcar. Quinua. Arroz integral o salvaje. Carnes y otras  protenas Frutos de mar. Carne de ave sin piel. Cortes magros de ave y carne de res. Tofu. Frutos secos. Semillas. Lcteos Productos lcteos sin grasa o con bajo contenido de [place], [place], yogur y [place]. Es posible que los productos detallados arriba no constituyan una lista completa de los alimentos y las bebidas que puede tomar. Consulte a un nutricionista para obtener ms informacin. Qu alimentos debo evitar? Nils Pyle Frutas enlatadas al almbar. Verduras Verduras enlatadas. Verduras congeladas con mantequilla o salsa de crema. Granos Productos elaborados con [place] y [place], como panes, pastas, bocadillos y cereales. Evite todos los alimentos procesados. Carnes y [address] de carne con alto contenido de Holiday representative. Carne de ave con piel. Carnes empanizadas o fritas. Carne procesada. Evite las grasas saturadas. Lcteos Yogur, queso o [employer] Health. Bebidas Bebidas azucaradas, como gaseosas o t helado. Es posible que los productos que se enumeran ms [place] no constituyan una lista completa de los alimentos y las bebidas que Personnel officer. Consulte a un nutricionista para obtener ms informacin. Preguntas para hacerle al mdico Debo consultar con un especialista certificado en atencin y educacin sobre la diabetes? Es necesario que me rena con un nutricionista? A qu nmero puedo llamar si tengo preguntas? Cules son los mejores momentos para controlar la glucemia? Dnde encontrar ms informacin: American Diabetes Association (Asociacin Estadounidense de la Diabetes): diabetes.org Academy of Nutrition and Dietetics (Academia de Nutricin y Pension scheme manager): eatright.Dana Corporation of Diabetes and Digestive and Kidney Diseases [employer] de la Diabetes y las Enfermedades Digestivas y Renales): StageSync.si Association of Diabetes  Care & Education Specialists (Asociacin de Especialistas en Atencin y Francella Solian la Diabetes):  diabeteseducator.org Resumen Es importante tener hbitos alimenticios saludables debido a que sus niveles de Psychologist, counselling sangre (glucosa) se ven afectados en gran medida por lo que come y bebe. Es importante consumir alcohol con prudencia. Un plan de comidas saludable lo ayudar a controlar la glucosa en sangre y a reducir el riesgo de enfermedades cardacas. El mdico puede recomendarle que trabaje con un nutricionista para elaborar el mejor plan para usted. Esta informacin no tiene Theme park manager el consejo del mdico. Asegrese de hacerle al mdico cualquier pregunta que tenga. Document Revised: 10/28/2019 Document Reviewed: 10/28/2019 Elsevier Patient Education  2024 [employer].

## 2023-10-02 NOTE — Progress Notes (Signed)
 Are you taking your medication the way it supposed to you take them every day your blood your diabetes medicines and you not miss any  Subjective:  Patient ID: Joseph Roberts, male    DOB: 02/21/1961  Age: 63 y.o. MRN: [id number]  CC: Diabetes   [name] is a 63 year old Hispanic male presents (interpreter Joseph Roberts [phone] ) for follow-up for uncontrolled type 2 diabetes patient does not check blood sugar at home HPI  Compliant with meds yes Checking CBGs? Yes  Fasting avg - 180 -240  Postprandial average -  Exercising regularly? - Yes Watching carbohydrate intake? - No Neuropathy ? - No Hypoglycemic events - No  - Recovers with :   Pertinent ROS:  Polyuria - No Polydipsia - No Vision problems - No  Medications as noted below. Taking them regularly without complication/adverse reaction being reported today.   [name] has a past medical history of BPH (benign prostatic hyperplasia), Diabetes mellitus (2011), GERD (gastroesophageal reflux disease), High triglycerides, and Hypertension.   He has a past surgical history that includes Colonoscopy (N/A, 10/24/2013); Cystoscopy with fulgeration (N/A, 03/04/2022); and Finger surgery.   His family history includes Diabetes in his brother, mother, and sister.He reports that he has been smoking cigarettes. He has a 15 pack-year smoking history. He has never been exposed to tobacco smoke. He has never used smokeless tobacco. He reports that he does not drink alcohol and does not use drugs.  Current Outpatient Medications on File Prior to Visit  Medication Sig Dispense Refill   atorvastatin  (LIPITOR) 40 MG tablet Take 1 tablet (40 mg total) by mouth daily. 90 tablet 1   Blood Glucose Monitoring Suppl (ONE TOUCH ULTRA MINI) W/DEVICE KIT 1 each by Does not apply route daily. Test fasting daily     Dulaglutide  (TRULICITY ) 3 MG/0.5ML SOAJ Inject 3 mg into the skin once a week. 6 mL 1   gabapentin  (NEURONTIN ) 300 MG  capsule Take 1 capsule (300 mg total) by mouth 3 (three) times daily. For the first week only take one pill at night. Then increase to twice a day for a week and then if needed, increase to three times a day. 90 capsule 2   glucose blood (TRUE METRIX BLOOD GLUCOSE TEST) test strip Use as instructed. Check blood glucose level by fingerstick three times per day. 100 each 12   Insulin  Glargine (BASAGLAR  KWIKPEN) 100 UNIT/ML Inject 48 Units into the skin daily. 15 mL 2   insulin  lispro (HUMALOG  KWIKPEN) 100 UNIT/ML KwikPen Inject 20 Units into the skin 3 (three) times daily. 18 mL 2   Insulin  Pen Needle 31G X 5 MM MISC use as directed to inject insulin  4 times daily 200 each 3   Insulin  Syringe-Needle U-100 (TRUEPLUS INSULIN  SYRINGE) 31G X 5/16 0.3 ML MISC Use to inject Humalog  three times daily. 100 each 2   lisinopril  (ZESTRIL ) 5 MG tablet Take 1 tablet (5 mg total) by mouth daily. 90 tablet 1   meloxicam  (MOBIC ) 7.5 MG tablet Take 2 tablets (15 mg total) by mouth daily. 60 tablet 0   metFORMIN  (GLUCOPHAGE -XR) 500 MG 24 hr tablet Take 2 tablets (1,000 mg total) by mouth 2 (two) times daily. 120 tablet 3   pantoprazole  (PROTONIX ) 40 MG tablet 1 CAP(S) QD ORALLY 30 DAY(S) for 30     tadalafil  (CIALIS ) 10 MG tablet Take 1 tablet (10 mg total) by mouth as needed prior to anticipated sexual intercourse. 30 tablet 0   TRUEplus Lancets 28G MISC  Use as instructed. Check blood glucose level by fingerstick three times per day. 100 each 3   No current facility-administered medications on file prior to visit.    Review of Systems Comprehensive ROS Pertinent positive and negative noted in HPI   Objective:  BP 125/81   Pulse (!) 58   Resp 16   Wt 156 lb 3.2 oz (70.9 kg)   SpO2 99%   BMI 26.81 kg/m   BP Readings from Last 3 Encounters:  10/02/23 125/81  07/31/23 126/82  06/27/23 135/81    Wt Readings from Last 3 Encounters:  10/02/23 156 lb 3.2 oz (70.9 kg)  07/31/23 154 lb (69.9 kg)  06/27/23  154 lb 3.2 oz (69.9 kg)    Physical Exam Vitals reviewed.  Constitutional:      Appearance: Normal appearance.  HENT:     Head: Normocephalic.     Right Ear: Tympanic membrane, ear canal and external ear normal.     Left Ear: Tympanic membrane, ear canal and external ear normal.     Nose: Nose normal.  Eyes:     Extraocular Movements: Extraocular movements intact.     Pupils: Pupils are equal, round, and reactive to light.  Cardiovascular:     Rate and Rhythm: Normal rate and regular rhythm.  Pulmonary:     Effort: Pulmonary effort is normal.     Breath sounds: Normal breath sounds.  Abdominal:     General: Bowel sounds are normal. There is distension.     Palpations: Abdomen is soft.  Musculoskeletal:        General: Normal range of motion.     Cervical back: Normal range of motion.  Skin:    General: Skin is warm and dry.  Neurological:     Mental Status: He is alert and oriented to person, place, and time.  Psychiatric:        Mood and Affect: Mood normal.        Behavior: Behavior normal.        Thought Content: Thought content normal.        Judgment: Judgment normal.     Lab Results  Component Value Date   HGBA1C 12.3 (A) 09/24/2023   HGBA1C 10.7 (A) 06/27/2023   HGBA1C 13.0 (A) 03/29/2023    Lab Results  Component Value Date   WBC 8.9 04/22/2023   HGB 15.0 04/22/2023   HCT 46.5 04/22/2023   PLT 298 04/22/2023   GLUCOSE 270 (H) 04/22/2023   CHOL 185 04/22/2023   TRIG 466 (H) 04/22/2023   HDL 32 (L) 04/22/2023   LDLCALC 78 04/22/2023   ALT 9 04/22/2023   AST 14 04/22/2023   NA 139 04/22/2023   K 4.4 04/22/2023   CL 105 04/22/2023   CREATININE 1.09 04/22/2023   BUN 15 04/22/2023   CO2 18 (L) 04/22/2023   TSH 1.976 04/06/2013   INR 1.1 03/01/2022   HGBA1C 12.3 (A) 09/24/2023   MICROALBUR 34.9 10/17/2015    Title   Diabetic Foot Exam - detailed Date & Time: 10/02/2023  9:38 AM Diabetic Foot exam was performed with the following findings: Yes   Visual Foot Exam completed.: Yes  Is there a history of foot ulcer?: No Is there a foot ulcer now?: No Is there swelling?: No Is there elevated skin temperature?: No Is there abnormal foot shape?: No Is there a claw toe deformity?: No Are the toenails long?: No Are the toenails thick?: No Are the toenails ingrown?: No Is  the skin thin, fragile, shiny and hairless?: No Normal Range of Motion?: Yes Is there foot or ankle muscle weakness?: No Do you have pain in calf while walking?: No Are the shoes appropriate in style and fit?: Yes Can the patient see the bottom of their feet?: Yes Pulse Foot Exam completed.: Yes      Semmes-Weinstein Monofilament Test + means has sensation and - means no sensation      Image components are not supported.   Image components are not supported. Image components are not supported.  Tuning Fork Comments Right big toe cut toe to close sore      Assessment & Plan:   Dayn was seen today for diabetes.  Diagnoses and all orders for this visit:  Uncontrolled type 2 diabetes mellitus with hyperglycemia (HCC)  Complications from uncontrolled diabetes -diabetic retinopathy leading to blindness, diabetic nephropathy leading to dialysis, decrease in circulation decrease in sores or wound healing which may lead to amputations and increase of heart attack and stroke . Effects on the liver- non-alcoholic fatty liver disease (NAFLD), which can progress to non-alcoholic steatohepatitis (NASH), cirrhosis, liver failure, and even liver cancer , lead to fat accumulation in the liver, causing NAFLD, and if left untreated, can lead to more severe liver damage.  -     CMP14+EGFR  Essential hypertension Controlled  Well controlled  BP goal - < 130/80 DIET: Limit salt intake, read nutrition labels to check salt content, limit fried and high fatty foods  Avoid using multisymptom OTC cold preparations that generally contain sudafed which can rise BP.  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  The above assessment and management plan was discussed with the patient. The patient verbalized understanding of and has agreed to the management plan. Patient is aware to call the clinic if symptoms fail to improve or worsen. Patient is aware when to return to the clinic for a follow-up visit. Patient educated on when it is appropriate to go to the emergency department.   Rosaline Bohr, NP-C

## 2023-10-03 ENCOUNTER — Ambulatory Visit: Payer: Self-pay | Admitting: Primary Care

## 2023-10-03 LAB — LIPID PANEL
Chol/HDL Ratio: 5.1 ratio — ABNORMAL HIGH (ref 0.0–5.0)
Cholesterol, Total: 148 mg/dL (ref 100–199)
HDL: 29 mg/dL — ABNORMAL LOW (ref >39)
LDL Chol Calc (NIH): 60 mg/dL (ref 0–99)
Triglycerides: 380 mg/dL — ABNORMAL HIGH (ref 0–149)
VLDL Cholesterol Cal: 59 mg/dL — ABNORMAL HIGH (ref 5–40)

## 2023-10-03 LAB — CMP14+EGFR
ALT: 14 IU/L (ref 0–44)
AST: 15 IU/L (ref 0–40)
Albumin: 3.8 g/dL — ABNORMAL LOW (ref 3.9–4.9)
Alkaline Phosphatase: 133 IU/L — ABNORMAL HIGH (ref 44–121)
BUN/Creatinine Ratio: 15 (ref 10–24)
BUN: 15 mg/dL (ref 8–27)
Bilirubin Total: 0.3 mg/dL (ref 0.0–1.2)
CO2: 17 mmol/L — ABNORMAL LOW (ref 20–29)
Calcium: 9.2 mg/dL (ref 8.6–10.2)
Chloride: 108 mmol/L — ABNORMAL HIGH (ref 96–106)
Creatinine, Ser: 0.99 mg/dL (ref 0.76–1.27)
Globulin, Total: 3.2 g/dL (ref 1.5–4.5)
Glucose: 174 mg/dL — ABNORMAL HIGH (ref 70–99)
Potassium: 4.5 mmol/L (ref 3.5–5.2)
Sodium: 139 mmol/L (ref 134–144)
Total Protein: 7 g/dL (ref 6.0–8.5)
eGFR: 86 mL/min/1.73 (ref >59)

## 2023-10-03 LAB — CBC WITH DIFFERENTIAL/PLATELET
Basophils Absolute: 0.1 x10E3/uL (ref 0.0–0.2)
Basos: 1 %
EOS (ABSOLUTE): 0.4 x10E3/uL (ref 0.0–0.4)
Eos: 4 %
Hematocrit: 45.4 % (ref 37.5–51.0)
Hemoglobin: 14.2 g/dL (ref 13.0–17.7)
Immature Grans (Abs): 0 x10E3/uL (ref 0.0–0.1)
Immature Granulocytes: 0 %
Lymphocytes Absolute: 3.5 x10E3/uL — ABNORMAL HIGH (ref 0.7–3.1)
Lymphs: 37 %
MCH: 28.5 pg (ref 26.6–33.0)
MCHC: 31.3 g/dL — ABNORMAL LOW (ref 31.5–35.7)
MCV: 91 fL (ref 79–97)
Monocytes Absolute: 0.8 x10E3/uL (ref 0.1–0.9)
Monocytes: 8 %
Neutrophils Absolute: 4.8 x10E3/uL (ref 1.4–7.0)
Neutrophils: 50 %
Platelets: 274 x10E3/uL (ref 150–450)
RBC: 4.99 x10E6/uL (ref 4.14–5.80)
RDW: 13.4 % (ref 11.6–15.4)
WBC: 9.7 x10E3/uL (ref 3.4–10.8)

## 2023-10-09 ENCOUNTER — Other Ambulatory Visit: Payer: Self-pay

## 2023-10-15 ENCOUNTER — Telehealth (INDEPENDENT_AMBULATORY_CARE_PROVIDER_SITE_OTHER): Payer: Self-pay | Admitting: Primary Care

## 2023-10-15 NOTE — Telephone Encounter (Signed)
 Called pt to confirm appt. Pt will be present.

## 2023-10-16 ENCOUNTER — Ambulatory Visit (INDEPENDENT_AMBULATORY_CARE_PROVIDER_SITE_OTHER): Payer: Self-pay

## 2023-10-21 ENCOUNTER — Other Ambulatory Visit: Payer: Self-pay

## 2023-10-28 ENCOUNTER — Telehealth: Payer: Self-pay | Admitting: Primary Care

## 2023-10-28 NOTE — Telephone Encounter (Signed)
 LVM to confirm appt

## 2023-10-29 ENCOUNTER — Other Ambulatory Visit: Payer: Self-pay

## 2023-10-29 ENCOUNTER — Ambulatory Visit: Payer: Self-pay

## 2023-10-29 ENCOUNTER — Encounter: Payer: Self-pay | Admitting: Pharmacist

## 2023-10-29 ENCOUNTER — Ambulatory Visit: Payer: Self-pay | Attending: Family Medicine | Admitting: Pharmacist

## 2023-10-29 DIAGNOSIS — Z7985 Long-term (current) use of injectable non-insulin antidiabetic drugs: Secondary | ICD-10-CM

## 2023-10-29 DIAGNOSIS — E1165 Type 2 diabetes mellitus with hyperglycemia: Secondary | ICD-10-CM

## 2023-10-29 DIAGNOSIS — Z794 Long term (current) use of insulin: Secondary | ICD-10-CM

## 2023-10-29 DIAGNOSIS — Z7984 Long term (current) use of oral hypoglycemic drugs: Secondary | ICD-10-CM

## 2023-10-29 MED ORDER — TRUE METRIX BLOOD GLUCOSE TEST VI STRP
ORAL_STRIP | 12 refills | Status: AC
  Filled 2023-10-29: qty 100, 33d supply, fill #0

## 2023-10-29 NOTE — Progress Notes (Signed)
 S:     63 y.o. male who presents for diabetes evaluation, education, and management. PMH is significant for BPH, diabetes mellitus (2011), HLD, and HTN.   Patient was last seen by Rosaline on 06/27/2023 and last seen by pharmacist on 09/24/2023. A1c at that visit showed worsening DM control at 12.3 (up from 10.7 prior). I noted suboptimal adherence and had him refill his medications. We also increased his Basaglar  to 28 units daily and Humalog  to 20 units BID before meals. We continued Trulicity  and metformin .   Today, patient arrives in good spirits. [address] [place], [place] # [phone]) used. Regarding his DM, he is doing a better job with adherence since seeing me in [place] but adherence is still not 100%. His refill history is listed below. Of note, he is tolerating the Trulicity  well. Denies any NV, abdominal pain. No changes in vision.   Family/Social History:  - FHx: diabetes (mother, brother, sister) - Tobacco Use: Current -15 pack-year smoking history   Current diabetes medications include:  - Basaglar  48 units daily (last filled 31-day supply 09/03/23) - Humalog  20 units BID (last filled 09/26/2023 for a 30 day supply) - Trulicity  3 mg weekly (last filled 10/21/23 for a 28 day supply) - Metformin  1000 mg XR BID - takes two 500 mg tabs BID (last filled 10/21/23 for a 30 day supply)  Insurance coverage: Self-Pay  Patient denies hypoglycemic events.  Reported home fasting blood sugars:  -Since last visit with me, blood sugars are  84 - 130s -Post-prandial/random: none   Patient denies polyuria. 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 Patient denies visual changes. Patient reports self foot exams.   Patient reported dietary habits: - Reports cutting back on carbs and tortillas; eats ~ 2 tortillas a day - No changes in diet  - Usually only eats 1 meal daily at lunch    Patient-reported exercise habits:  - Walks 10-20 minutes a day at the park - Reports he is  still walking daily   O:   Lab Results  Component Value Date   HGBA1C 12.3 (A) 09/24/2023   There were no vitals filed for this visit.  Lipid Panel     Component Value Date/Time   CHOL 148 10/02/2023 0909   TRIG 380 (H) 10/02/2023 0909   HDL 29 (L) 10/02/2023 0909   CHOLHDL 5.1 (H) 10/02/2023 0909   CHOLHDL 4.7 03/07/2016 1046   VLDL 61 (H) 03/07/2016 1046   LDLCALC 60 10/02/2023 0909    Clinical Atherosclerotic Cardiovascular Disease (ASCVD): No  The 10-year ASCVD risk score (Arnett DK, et al., 2019) is: 33%   Values used to calculate the score:     Age: 55 years     Clincally relevant sex: Male     Is Non-Hispanic African American: No     Diabetic: Yes     Tobacco smoker: Yes     Systolic Blood Pressure: 125 mmHg     Is BP treated: Yes     HDL Cholesterol: 29 mg/dL     Total Cholesterol: 148 mg/dL   Patient is participating in a Managed Medicaid Plan: No   A/P: Diabetes longstanding, currently uncontrolled. A1c is uptrending. It does seem like his adherence is improving and his reported fasting sugar readings are at goal. He is not hypoglycemic and is able to verbalize appropriate hypoglycemia management plan. We will hold off on any changes and see him in 1 month.  -Increase Basaglar  to 48 units at  night -Increase Humalog  to 20 units BID before breakfast and lunch (he does not eat dinner).  -Continue Trulicity  to 3 mg weekly (on Fridays). -Continue metformin  1000 mg XR BID (takes two 500 mg tabs BID) -Patient educated on purpose, proper use, and potential adverse effects of medications.  -Extensively discussed pathophysiology of diabetes, recommended lifestyle interventions, dietary effects on blood sugar control.  -Counseled on s/sx of and management of hypoglycemia.  -A1c anticipated 12/2023  Written patient instructions provided. Patient verbalized understanding of treatment plan.  Total time in face to face counseling 30 minutes.    Follow-up:  Me: 4  weeks  Herlene Fleeta Morris, PharmD, [place], CPP Clinical Pharmacist [facility] [phone]

## 2023-10-30 ENCOUNTER — Ambulatory Visit (INDEPENDENT_AMBULATORY_CARE_PROVIDER_SITE_OTHER): Payer: Self-pay

## 2023-11-06 ENCOUNTER — Ambulatory Visit (INDEPENDENT_AMBULATORY_CARE_PROVIDER_SITE_OTHER): Payer: Self-pay

## 2023-11-08 ENCOUNTER — Other Ambulatory Visit: Payer: Self-pay

## 2023-11-13 ENCOUNTER — Ambulatory Visit (INDEPENDENT_AMBULATORY_CARE_PROVIDER_SITE_OTHER): Payer: Self-pay

## 2023-11-19 ENCOUNTER — Other Ambulatory Visit: Payer: Self-pay

## 2023-11-19 ENCOUNTER — Other Ambulatory Visit: Payer: Self-pay | Admitting: Primary Care

## 2023-11-19 ENCOUNTER — Other Ambulatory Visit (INDEPENDENT_AMBULATORY_CARE_PROVIDER_SITE_OTHER): Payer: Self-pay | Admitting: Primary Care

## 2023-11-19 DIAGNOSIS — E1165 Type 2 diabetes mellitus with hyperglycemia: Secondary | ICD-10-CM

## 2023-11-19 DIAGNOSIS — Z760 Encounter for issue of repeat prescription: Secondary | ICD-10-CM

## 2023-11-19 MED ORDER — LISINOPRIL 10 MG PO TABS
10.0000 mg | ORAL_TABLET | Freq: Every day | ORAL | 1 refills | Status: DC
  Filled 2023-11-19: qty 60, 60d supply, fill #0
  Filled 2023-12-04: qty 30, 30d supply, fill #0
  Filled 2024-01-02 (×2): qty 30, 30d supply, fill #1

## 2023-11-20 MED ORDER — METFORMIN HCL ER 500 MG PO TB24
1000.0000 mg | ORAL_TABLET | Freq: Two times a day (BID) | ORAL | 1 refills | Status: DC
  Filled 2023-11-20 – 2023-12-04 (×2): qty 120, 30d supply, fill #0
  Filled 2023-12-25: qty 120, 30d supply, fill #1

## 2023-11-21 ENCOUNTER — Other Ambulatory Visit: Payer: Self-pay

## 2023-11-28 ENCOUNTER — Other Ambulatory Visit: Payer: Self-pay

## 2023-12-02 ENCOUNTER — Other Ambulatory Visit: Payer: Self-pay

## 2023-12-04 ENCOUNTER — Other Ambulatory Visit: Payer: Self-pay

## 2023-12-09 ENCOUNTER — Telehealth: Payer: Self-pay | Admitting: Primary Care

## 2023-12-09 NOTE — Telephone Encounter (Signed)
 Confirmed appt for 10/7

## 2023-12-10 ENCOUNTER — Ambulatory Visit: Payer: Self-pay | Attending: Primary Care | Admitting: Pharmacist

## 2023-12-10 ENCOUNTER — Encounter: Payer: Self-pay | Admitting: Pharmacist

## 2023-12-10 ENCOUNTER — Other Ambulatory Visit: Payer: Self-pay

## 2023-12-10 DIAGNOSIS — H9311 Tinnitus, right ear: Secondary | ICD-10-CM

## 2023-12-10 DIAGNOSIS — Z7984 Long term (current) use of oral hypoglycemic drugs: Secondary | ICD-10-CM

## 2023-12-10 DIAGNOSIS — Z7985 Long-term (current) use of injectable non-insulin antidiabetic drugs: Secondary | ICD-10-CM

## 2023-12-10 DIAGNOSIS — E1165 Type 2 diabetes mellitus with hyperglycemia: Secondary | ICD-10-CM

## 2023-12-10 DIAGNOSIS — Z794 Long term (current) use of insulin: Secondary | ICD-10-CM

## 2023-12-10 LAB — POCT GLYCOSYLATED HEMOGLOBIN (HGB A1C): HbA1c, POC (controlled diabetic range): 8.3 % — AB (ref 0.0–7.0)

## 2023-12-10 MED ORDER — INSULIN LISPRO (1 UNIT DIAL) 100 UNIT/ML (KWIKPEN)
24.0000 [IU] | PEN_INJECTOR | Freq: Three times a day (TID) | SUBCUTANEOUS | 6 refills | Status: AC
  Filled 2023-12-10 – 2023-12-25 (×2): qty 21, 29d supply, fill #0
  Filled 2024-01-21: qty 21, 29d supply, fill #1
  Filled 2024-02-21: qty 21, 29d supply, fill #2
  Filled 2024-04-10: qty 21, 29d supply, fill #3

## 2023-12-10 NOTE — Progress Notes (Signed)
 S:     63 y.o. male who presents for diabetes evaluation, education, and management. PMH is significant for BPH, diabetes mellitus (2011), HLD, and HTN.   Patient was last seen by Rosaline on 06/27/2023 and last seen by pharmacist on 10/29/2023. A1c in July of this year was 12.3%.   When I saw him in August, he endorsed improved fasting sugar control. He was unable to provide me with post-prandial readings. I noted better adherence to his Basal + Bolus insulin  regimen, Trulicity , and metformin . I continued this and asked him to start monitoring sugar post-prandially.  Today, patient arrives in good spirits. [address] [place], [place] # [phone]) used. Regarding his DM, he is doing a better job with adherence since seeing me in August. His refill history is listed below. Of note, he is tolerating the Trulicity  well. Denies any NV, abdominal pain. No changes in vision.   Additionally, he endorses ringing in his R ear. Denies any sensation of fullness. No diminished hearing. Whisper test negative. Denies any use of q-tips, injury to the ear, or allergies. Denies any previous issues with ear infection. Tells me today this issue has been going on for about 1 year.   Family/Social History:  - FHx: diabetes (mother, brother, sister) - Tobacco Use: Current -15 pack-year smoking history   Current diabetes medications include:  - Basaglar  48 units daily (last filled 31-day supply 12/04/2023) - Humalog  20 units BID (last filled 12/04/2023 for a 30 day supply) - Trulicity  3 mg weekly (last filled 11/22/2023 for a 28 day supply) - Metformin  1000 mg XR BID - takes two 500 mg tabs BID (last filled 12/04/2023 for a 30 day supply)  Insurance coverage: Self-Pay  Patient denies hypoglycemic events.  Reported home fasting blood sugars:  -Since last visit with me, blood sugars are 100-140 -Post-prandial/random: checks rarely, will most commonly see 220s    Patient denies polyuria. Patient reports  neuropathy (nerve pain) in both legs. Patient denies visual changes. Patient reports self foot exams.   Patient reported dietary habits: - Reports cutting back on carbs and tortillas; eats ~ 2 tortillas a day - No changes in diet  - Usually only eats 1 meal daily at lunch (injects 20 units Humalog  before lunch)   Patient-reported exercise habits:  - Walks 10-20 minutes a day at the park - Reports he is still walking daily   O:   Lab Results  Component Value Date   HGBA1C 8.3 (A) 12/10/2023   There were no vitals filed for this visit.  Lipid Panel     Component Value Date/Time   CHOL 148 10/02/2023 0909   TRIG 380 (H) 10/02/2023 0909   HDL 29 (L) 10/02/2023 0909   CHOLHDL 5.1 (H) 10/02/2023 0909   CHOLHDL 4.7 03/07/2016 1046   VLDL 61 (H) 03/07/2016 1046   LDLCALC 60 10/02/2023 0909    Clinical Atherosclerotic Cardiovascular Disease (ASCVD): No  The 10-year ASCVD risk score (Arnett DK, et al., 2019) is: 33%   Values used to calculate the score:     Age: 63 years     Clincally relevant sex: Male     Is Non-Hispanic African American: No     Diabetic: Yes     Tobacco smoker: Yes     Systolic Blood Pressure: 125 mmHg     Is BP treated: Yes     HDL Cholesterol: 29 mg/dL     Total Cholesterol: 148 mg/dL   Patient is participating in a  Managed Medicaid Plan: No   A/P: Diabetes longstanding, currently uncontrolled but improved tremendously. His A1c today is down from 12.3% three months ago to 8.3% today. Commended him for this but acknowledged we can still improve. It does seem like his adherence is improving and his reported fasting sugar readings are at goal. Post prandial levels should improve as we titrate his bolus insulin . He is not hypoglycemic and is able to verbalize appropriate hypoglycemia management plan.  -Continue Basaglar  48 units at night -Increase Humalog  to 24 units BID before breakfast and lunch (he does not eat dinner).  -Continue Trulicity  to 3 mg  weekly (on Fridays). -Continue metformin  1000 mg XR BID (takes two 500 mg tabs BID) -Patient educated on purpose, proper use, and potential adverse effects of medications.  -Extensively discussed pathophysiology of diabetes, recommended lifestyle interventions, dietary effects on blood sugar control.  -Counseled on s/sx of and management of hypoglycemia.  -A1c anticipated 03/2024. -Referral submitted to ENT for R ear tinnitus per patient request.  Written patient instructions provided. Patient verbalized understanding of treatment plan.  Total time in face to face counseling 30 minutes.    Follow-up:  Me: 4 weeks  Herlene Fleeta Morris, PharmD, [place], CPP Clinical Pharmacist [facility] [phone]

## 2023-12-25 ENCOUNTER — Other Ambulatory Visit: Payer: Self-pay

## 2023-12-26 ENCOUNTER — Other Ambulatory Visit: Payer: Self-pay | Admitting: Student

## 2023-12-26 MED ORDER — SILDENAFIL CITRATE 50 MG PO TABS: 50.0000 mg | ORAL_TABLET | ORAL | 2 refills | Status: AC | PRN

## 2023-12-26 NOTE — Progress Notes (Signed)
 Prescribing Viagra. Discussed with patient to take 1-2 hours prior to sexual intercourse on an empty stomach. Can start with 1 pill. If no effect, can increase to 2 pills.   Lyle GEANNIE Civil, MD Resident, PGY4 Department of Urology

## 2023-12-27 ENCOUNTER — Other Ambulatory Visit: Payer: Self-pay

## 2024-01-02 ENCOUNTER — Other Ambulatory Visit (HOSPITAL_COMMUNITY): Payer: Self-pay | Admitting: Urology

## 2024-01-02 ENCOUNTER — Other Ambulatory Visit: Payer: Self-pay

## 2024-01-02 ENCOUNTER — Encounter (INDEPENDENT_AMBULATORY_CARE_PROVIDER_SITE_OTHER): Payer: Self-pay | Admitting: Primary Care

## 2024-01-02 ENCOUNTER — Ambulatory Visit (INDEPENDENT_AMBULATORY_CARE_PROVIDER_SITE_OTHER): Payer: Self-pay | Admitting: Primary Care

## 2024-01-02 ENCOUNTER — Other Ambulatory Visit (INDEPENDENT_AMBULATORY_CARE_PROVIDER_SITE_OTHER): Payer: Self-pay | Admitting: Primary Care

## 2024-01-02 VITALS — BP 132/84 | HR 61 | Resp 16 | Wt 158.2 lb

## 2024-01-02 DIAGNOSIS — E1169 Type 2 diabetes mellitus with other specified complication: Secondary | ICD-10-CM

## 2024-01-02 DIAGNOSIS — J302 Other seasonal allergic rhinitis: Secondary | ICD-10-CM

## 2024-01-02 DIAGNOSIS — G4452 New daily persistent headache (NDPH): Secondary | ICD-10-CM

## 2024-01-02 DIAGNOSIS — Z1211 Encounter for screening for malignant neoplasm of colon: Secondary | ICD-10-CM

## 2024-01-02 DIAGNOSIS — R310 Gross hematuria: Secondary | ICD-10-CM

## 2024-01-02 DIAGNOSIS — R0981 Nasal congestion: Secondary | ICD-10-CM

## 2024-01-02 DIAGNOSIS — E785 Hyperlipidemia, unspecified: Secondary | ICD-10-CM

## 2024-01-02 DIAGNOSIS — I10 Essential (primary) hypertension: Secondary | ICD-10-CM

## 2024-01-02 DIAGNOSIS — E781 Pure hyperglyceridemia: Secondary | ICD-10-CM

## 2024-01-02 DIAGNOSIS — Z760 Encounter for issue of repeat prescription: Secondary | ICD-10-CM

## 2024-01-02 MED ORDER — FEXOFENADINE HCL 180 MG PO TABS
180.0000 mg | ORAL_TABLET | Freq: Every day | ORAL | 1 refills | Status: AC
Start: 2024-01-02 — End: ?
  Filled 2024-01-02: qty 90, 90d supply, fill #0

## 2024-01-02 MED ORDER — FLUTICASONE PROPIONATE 50 MCG/ACT NA SUSP
2.0000 | Freq: Every day | NASAL | 6 refills | Status: AC
  Filled 2024-01-02: qty 16, 30d supply, fill #0

## 2024-01-02 NOTE — Progress Notes (Signed)
 [facility]  Joseph Roberts, is a 63 y.o. 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  Management of hypertension  No new weakness tingling or numbness, No Cough - shortness of breath which can also be a contributing factor of headaches. He has had a headache for the last 4 days only drinks coffee once daily denies dark soda's or mt dew no chocolate or alcohol.  Location of headache is on the left side typical area that radiates to temporal he also has rhinitis, nasal congestion.  Explained with the symptoms more than likely sinuses.  .  Blood pressure is also elevated will recheck  No problems updated.  Comprehensive ROS Pertinent positive and negative noted in HPI   No Known Allergies  Past Medical History:  Diagnosis Date   BPH (benign prostatic hyperplasia)    Diabetes mellitus 2011   GERD (gastroesophageal reflux disease)    High triglycerides    Hypertension     Current Outpatient Medications on File Prior to Visit  Medication Sig Dispense Refill   atorvastatin  (LIPITOR) 40 MG tablet Take 1 tablet (40 mg total) by mouth daily. 90 tablet 1   Blood Glucose Monitoring Suppl (ONE TOUCH ULTRA MINI) W/DEVICE KIT 1 each by Does not apply route daily. Test fasting daily     Dulaglutide  (TRULICITY ) 3 MG/0.5ML SOAJ Inject 3 mg into the skin once a week. 6 mL 1   gabapentin  (NEURONTIN ) 300 MG capsule Take 1 capsule (300 mg total) by mouth 3 (three) times daily. For the first week only take one pill at night. Then increase to twice a day for a week and then if needed, increase to three times a day. 90 capsule 2   glucose blood (TRUE METRIX BLOOD GLUCOSE TEST) test strip Use as instructed. Check blood glucose level by fingerstick three times per day. 100 each 12    Insulin  Glargine (BASAGLAR  KWIKPEN) 100 UNIT/ML Inject 48 Units into the skin daily. 15 mL 2   insulin  lispro (HUMALOG  KWIKPEN) 100 UNIT/ML KwikPen Inject 24 Units into the skin 3 (three) times daily. 21 mL 6   Insulin  Pen Needle 31G X 5 MM MISC use as directed to inject insulin  4 times daily 200 each 3   Insulin  Syringe-Needle U-100 (TRUEPLUS INSULIN  SYRINGE) 31G X 5/16 0.3 ML MISC Use to inject Humalog  three times daily. 100 each 2   lisinopril  (ZESTRIL ) 10 MG tablet Take 1 tablet (10 mg total) by mouth daily. 30 tablet 1   meloxicam  (MOBIC ) 7.5 MG tablet Take 2 tablets (15 mg total) by mouth daily. 60 tablet 0   metFORMIN  (GLUCOPHAGE -XR) 500 MG 24 hr tablet Take 2 tablets (1,000 mg total) by mouth 2 (two) times daily. 120 tablet 1   pantoprazole  (PROTONIX ) 40 MG tablet 1 CAP(S) QD ORALLY 30 DAY(S) for 30     sildenafil (VIAGRA) 50 MG tablet Take 1 tablet (50 mg total) by mouth as needed for erectile dysfunction. 20 tablet 2   TRUEplus Lancets 28G MISC Use as instructed. Check blood glucose level by fingerstick three times per day. 100 each 3   No current facility-administered medications on file prior to visit.   Health Maintenance  Topic Date Due   Pneumococcal Vaccine for age over 44 (2 of  2 - PCV) 10/25/2014   Eye exam for diabetics  07/28/2018   Colon Cancer Screening  10/25/2023   COVID-19 Vaccine (3 - 2025-26 season) 11/04/2023   Zoster (Shingles) Vaccine (1 of 2) 01/02/2024*   DTaP/Tdap/Td vaccine (1 - Tdap) 04/21/2024*   Flu Shot  06/02/2024*   Yearly kidney health urinalysis for diabetes  04/21/2024   Hemoglobin A1C  06/09/2024   Yearly kidney function blood test for diabetes  10/01/2024   Complete foot exam   10/01/2024   Hepatitis C Screening  Completed   HIV Screening  Completed   Hepatitis B Vaccine  Aged Out   HPV Vaccine  Aged Out   Meningitis B Vaccine  Aged Out  *Topic was postponed. The date shown is not the original due date.    Objective:   Vitals:    01/02/24 0847 01/02/24 0848 01/02/24 0915  BP: (!) 153/92 (!) 147/79 132/84  Pulse: 61    Resp: 16    SpO2: 99%    Weight: 158 lb 3.2 oz (71.8 kg)     BP Readings from Last 3 Encounters:  01/02/24 132/84  10/02/23 125/81  07/31/23 126/82      Physical Exam Vitals reviewed.  Constitutional:      Appearance: Normal appearance.  HENT:     Head: Normocephalic.     Right Ear: Tympanic membrane, ear canal and external ear normal.     Left Ear: Tympanic membrane, ear canal and external ear normal.     Nose: Congestion and rhinorrhea present.     Comments: Left nostril turbinate red swollen right clear Eyes:     Extraocular Movements: Extraocular movements intact.     Pupils: Pupils are equal, round, and reactive to light.  Cardiovascular:     Rate and Rhythm: Normal rate and regular rhythm.  Pulmonary:     Effort: Pulmonary effort is normal.     Breath sounds: Normal breath sounds.  Abdominal:     General: Bowel sounds are normal. There is distension.     Palpations: Abdomen is soft.  Musculoskeletal:        General: Normal range of motion.     Cervical back: Normal range of motion.  Skin:    General: Skin is warm and dry.  Neurological:     Mental Status: He is alert and oriented to person, place, and time.  Psychiatric:        Mood and Affect: Mood normal.        Behavior: Behavior normal.        Thought Content: Thought content normal.        Judgment: Judgment normal.       Assessment & Plan  Joseph Roberts was seen today for diabetes and hypertension.  Diagnoses and all orders for this visit:  New daily persistent headache  Essential hypertension BP goal - < 140/90 DIET: Limit salt intake, read nutrition labels to check salt content, limit fried and high fatty foods  Avoid using multisymptom OTC cold preparations that generally contain sudafed which can rise BP. Consult with pharmacist on best cold relief products to use for persons with HTN EXERCISE Discussed  incorporating exercise such as walking - 30 minutes most days of the week and can do in 10 minute intervals    -     CMP14+EGFR  Dyslipidemia with low high density lipoprotein (HDL) cholesterol with hypertriglyceridemia due to type 2 diabetes mellitus (HCC) -     Lipid panel  -  fluticasone (FLONASE) 50 MCG/ACT nasal spray; Place 2 sprays into both nostrils daily. -     fexofenadine (ALLEGRA ALLERGY) 180 MG tablet; Take 1 tablet (180 mg total) by mouth daily.  Seasonal allergic rhinitis, unspecified trigger 2/2 Nasal congestion -     fluticasone (FLONASE) 50 MCG/ACT nasal spray; Place 2 sprays into both nostrils daily. -     fexofenadine (ALLEGRA ALLERGY) 180 MG tablet; Take 1 tablet (180 mg total) by mouth daily.  Colon cancer screening -     Cancel: Ambulatory referral to Gastroenterology -     Fecal occult blood, imunochemical; Future     Patient have been counseled extensively about nutrition and exercise. Other issues discussed during this visit include: low cholesterol diet, weight control and daily exercise, foot care, annual eye examinations at Ophthalmology, importance of adherence with medications and regular follow-up. We also discussed long term complications of uncontrolled diabetes and hypertension.   Return in about 3 months (around 04/03/2024) for fasting labs, re-check blood pressure.  The patient was given clear instructions to go to ER or return to medical center if symptoms don't improve, worsen or new problems develop. The patient verbalized understanding. The patient was told to call to get lab results if they haven't heard anything in the next week.   This note has been created with Education officer, environmental. Any transcriptional errors are unintentional.   Rosaline SHAUNNA Bohr, NP 01/02/2024, 9:26 AM

## 2024-01-03 ENCOUNTER — Other Ambulatory Visit: Payer: Self-pay

## 2024-01-03 MED ORDER — ATORVASTATIN CALCIUM 40 MG PO TABS
40.0000 mg | ORAL_TABLET | Freq: Every day | ORAL | 1 refills | Status: AC
  Filled 2024-01-03 – 2024-01-15 (×2): qty 90, 90d supply, fill #0
  Filled 2024-04-10: qty 90, 90d supply, fill #1

## 2024-01-03 NOTE — Telephone Encounter (Signed)
 Requested by interface surescripts. Future visit 04/03/24. Requested Prescriptions  Pending Prescriptions Disp Refills   atorvastatin  (LIPITOR) 40 MG tablet 90 tablet 1    Sig: Take 1 tablet (40 mg total) by mouth daily.     Cardiovascular:  Antilipid - Statins Failed - 01/03/2024  4:04 PM      Failed - Lipid Panel in normal range within the last 12 months    Cholesterol, Total  Date Value Ref Range Status  10/02/2023 148 100 - 199 mg/dL Final   LDL Chol Calc (NIH)  Date Value Ref Range Status  10/02/2023 60 0 - 99 mg/dL Final   HDL  Date Value Ref Range Status  10/02/2023 29 (L) >39 mg/dL Final   Triglycerides  Date Value Ref Range Status  10/02/2023 380 (H) 0 - 149 mg/dL Final         Passed - Patient is not pregnant      Passed - Valid encounter within last 12 months    Recent Outpatient Visits           Yesterday New daily persistent headache   [place] [facility] Celestia Rosaline SQUIBB, NP   3 weeks ago Type 2 diabetes mellitus with hyperglycemia, with long-term current use of insulin  [facility])   [facility] [place] Fleeta Morris, [place] L, RPH-CPP   2 months ago Type 2 diabetes mellitus with hyperglycemia, with long-term current use of insulin  [facility])   [facility] [place] Fleeta Morris, [place] L, RPH-CPP   3 months ago Uncontrolled type 2 diabetes mellitus with hyperglycemia [facility])   [place] [facility] Celestia Rosaline SQUIBB, NP   3 months ago Uncontrolled type 2 diabetes mellitus with hyperglycemia [facility])   [facility] [place] Fleeta Morris Garnette LITTIE, RPH-CPP

## 2024-01-04 ENCOUNTER — Ambulatory Visit (INDEPENDENT_AMBULATORY_CARE_PROVIDER_SITE_OTHER): Payer: Self-pay | Admitting: Primary Care

## 2024-01-04 LAB — CMP14+EGFR
ALT: 21 IU/L (ref 0–44)
AST: 19 IU/L (ref 0–40)
Albumin: 3.9 g/dL (ref 3.9–4.9)
Alkaline Phosphatase: 155 IU/L — ABNORMAL HIGH (ref 47–123)
BUN/Creatinine Ratio: 16 (ref 10–24)
BUN: 17 mg/dL (ref 8–27)
Bilirubin Total: 0.6 mg/dL (ref 0.0–1.2)
CO2: 18 mmol/L — ABNORMAL LOW (ref 20–29)
Calcium: 9.5 mg/dL (ref 8.6–10.2)
Chloride: 107 mmol/L — ABNORMAL HIGH (ref 96–106)
Creatinine, Ser: 1.07 mg/dL (ref 0.76–1.27)
Globulin, Total: 3.1 g/dL (ref 1.5–4.5)
Glucose: 183 mg/dL — ABNORMAL HIGH (ref 70–99)
Potassium: 4.4 mmol/L (ref 3.5–5.2)
Sodium: 140 mmol/L (ref 134–144)
Total Protein: 7 g/dL (ref 6.0–8.5)
eGFR: 78 mL/min/1.73 (ref >59)

## 2024-01-04 LAB — LIPID PANEL
Chol/HDL Ratio: 3.9 ratio (ref 0.0–5.0)
Cholesterol, Total: 124 mg/dL (ref 100–199)
HDL: 32 mg/dL — ABNORMAL LOW (ref >39)
LDL Chol Calc (NIH): 62 mg/dL (ref 0–99)
Triglycerides: 175 mg/dL — ABNORMAL HIGH (ref 0–149)
VLDL Cholesterol Cal: 30 mg/dL (ref 5–40)

## 2024-01-10 ENCOUNTER — Ambulatory Visit (HOSPITAL_COMMUNITY)
Admission: RE | Admit: 2024-01-10 | Discharge: 2024-01-10 | Disposition: A | Discharge status: Home / Self Care | Payer: Self-pay | Source: Ambulatory Visit | Attending: Urology | Admitting: Urology

## 2024-01-10 DIAGNOSIS — R310 Gross hematuria: Secondary | ICD-10-CM | POA: Insufficient documentation

## 2024-01-10 MED ORDER — IOHEXOL 300 MG/ML  SOLN
100.0000 mL | Freq: Once | INTRAMUSCULAR | Status: AC | PRN
  Administered 2024-01-10: 100 mL via INTRAVENOUS

## 2024-01-13 ENCOUNTER — Other Ambulatory Visit: Payer: Self-pay

## 2024-01-14 ENCOUNTER — Other Ambulatory Visit: Payer: Self-pay

## 2024-01-15 ENCOUNTER — Institutional Professional Consult (permissible substitution) (INDEPENDENT_AMBULATORY_CARE_PROVIDER_SITE_OTHER): Payer: Self-pay | Admitting: Physician Assistant

## 2024-01-15 ENCOUNTER — Telehealth (INDEPENDENT_AMBULATORY_CARE_PROVIDER_SITE_OTHER): Payer: Self-pay

## 2024-01-15 ENCOUNTER — Other Ambulatory Visit: Payer: Self-pay

## 2024-01-15 NOTE — Telephone Encounter (Signed)
 Reached back out to pt and pt stated he did re-apply and he was approved back in August 2025. Pt states he applied at [facility]. Reached back out to [place] and made him aware and per Eric since there is no information in the chart pt will need to come see him made pt aware and pt will come see Eric today   [place] interpreter: Rollo ID: [id number]

## 2024-01-20 ENCOUNTER — Telehealth: Payer: Self-pay | Admitting: Primary Care

## 2024-01-20 NOTE — Telephone Encounter (Signed)
 Confirmed appt for 11/18

## 2024-01-21 ENCOUNTER — Other Ambulatory Visit: Payer: Self-pay

## 2024-01-21 ENCOUNTER — Other Ambulatory Visit: Payer: Self-pay | Admitting: Family Medicine

## 2024-01-21 ENCOUNTER — Encounter: Payer: Self-pay | Admitting: Pharmacist

## 2024-01-21 ENCOUNTER — Other Ambulatory Visit: Payer: Self-pay | Admitting: Primary Care

## 2024-01-21 ENCOUNTER — Other Ambulatory Visit (INDEPENDENT_AMBULATORY_CARE_PROVIDER_SITE_OTHER): Payer: Self-pay | Admitting: Primary Care

## 2024-01-21 ENCOUNTER — Ambulatory Visit: Payer: Self-pay | Attending: Primary Care | Admitting: Pharmacist

## 2024-01-21 DIAGNOSIS — Z760 Encounter for issue of repeat prescription: Secondary | ICD-10-CM

## 2024-01-21 DIAGNOSIS — E1165 Type 2 diabetes mellitus with hyperglycemia: Secondary | ICD-10-CM

## 2024-01-21 DIAGNOSIS — Z7984 Long term (current) use of oral hypoglycemic drugs: Secondary | ICD-10-CM

## 2024-01-21 DIAGNOSIS — Z794 Long term (current) use of insulin: Secondary | ICD-10-CM

## 2024-01-21 DIAGNOSIS — Z7985 Long-term (current) use of injectable non-insulin antidiabetic drugs: Secondary | ICD-10-CM

## 2024-01-21 MED ORDER — LISINOPRIL 10 MG PO TABS
10.0000 mg | ORAL_TABLET | Freq: Every day | ORAL | 1 refills | Status: AC
  Filled 2024-01-21: qty 90, 90d supply, fill #0

## 2024-01-21 MED ORDER — BASAGLAR KWIKPEN 100 UNIT/ML ~~LOC~~ SOPN
48.0000 [IU] | PEN_INJECTOR | Freq: Every day | SUBCUTANEOUS | 2 refills | Status: AC
  Filled 2024-01-21: qty 15, 31d supply, fill #0
  Filled 2024-02-21: qty 15, 31d supply, fill #1
  Filled 2024-04-10: qty 15, 31d supply, fill #2

## 2024-01-21 MED ORDER — METFORMIN HCL ER 500 MG PO TB24
1000.0000 mg | ORAL_TABLET | Freq: Two times a day (BID) | ORAL | 1 refills | Status: AC
  Filled 2024-01-21: qty 120, 30d supply, fill #0
  Filled 2024-02-21 – 2024-04-03 (×3): qty 120, 30d supply, fill #1

## 2024-01-21 NOTE — Progress Notes (Signed)
 S:     63 y.o. male who presents for diabetes evaluation, education, and management. PMH is significant for BPH, diabetes mellitus (2011), HLD, and HTN.   Patient was last seen by Rosaline on 06/27/2023 and last seen by pharmacist on 12/10/23. A1c at that appt was 8.3%, down from 12.3% in July of this year.  When I saw him last month, he endorsed improved sugar control. I noted better adherence to his Basal + Bolus insulin  regimen, Trulicity , and metformin . I adjusted his Humalog  but kept other medications the same.   Today, patient arrives in good spirits. [address] [place], [place] # [phone]) used. Regarding his DM, he is doing a good job with adherence. His refill history is listed below. Of note, he is tolerating the Trulicity  well. Denies any NV, abdominal pain. No changes in vision.   Additionally, he endorses chronic L shoulder pain. Recently, however, he endorses decreased range of motion and pain with movement that has worsened over the last couple of weeks. Wonders if an xray is indicated.   Family/Social History:  - FHx: diabetes (mother, brother, sister) - Tobacco Use: Current -15 pack-year smoking history   Current diabetes medications include:  - Basaglar  48 units daily (last filled 31-day supply 12/27/23) - Humalog  24 units BID (last filled 29-day supply 12/27/23) - Trulicity  3 mg weekly (last filled 28-day supply 12/27/23) - Metformin  1000 mg XR BID - takes two 500 mg tabs BID (last filled 30-day supply 12/27/23)  Insurance coverage: Self-Pay  Patient denies hypoglycemic events.  Reported home blood sugars:  -Since last visit with me, blood sugars are 110-180 mg/dL throughout the day. Denies any readings > 200 mg/dL.  Patient denies polyuria. 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 Patient denies visual changes. Patient reports self foot exams.   Patient reported dietary habits: - Reports cutting back on carbs and tortillas; eats ~ 2 tortillas a  day - No changes in diet  - Usually only eats 1 meal daily at lunch   Patient-reported exercise habits:  - Walks 10-20 minutes a day at the park - Reports he is still walking daily   O:   Lab Results  Component Value Date   HGBA1C 8.3 (A) 12/10/2023   There were no vitals filed for this visit.  Lipid Panel     Component Value Date/Time   CHOL 124 01/02/2024 0936   TRIG 175 (H) 01/02/2024 0936   HDL 32 (L) 01/02/2024 0936   CHOLHDL 3.9 01/02/2024 0936   CHOLHDL 4.7 03/07/2016 1046   VLDL 61 (H) 03/07/2016 1046   LDLCALC 62 01/02/2024 0936    Clinical Atherosclerotic Cardiovascular Disease (ASCVD): No  The ASCVD Risk score (Arnett DK, et al., 2019) failed to calculate for the following reasons:   The valid total cholesterol range is 130 to 320 mg/dL   Patient is participating in a Managed Medicaid Plan: No   A/P: Diabetes longstanding, currently uncontrolled but improved tremendously. His A1c last month was down from 12.3% to 8.3% today. Commended him for this but acknowledged we can still improve. It does seem like his adherence is improving and his reported sugar readings are at goal. He is not hypoglycemic and is able to verbalize appropriate hypoglycemia management plan.  -Continue Basaglar  48 units at night -Continue Humalog  24 units BID before breakfast and lunch (he does not eat dinner).  -Continue Trulicity  to 3 mg weekly (on Fridays). -Continue metformin  1000 mg XR BID (takes two 500 mg tabs  BID) -Patient educated on purpose, proper use, and potential adverse effects of medications.  -Extensively discussed pathophysiology of diabetes, recommended lifestyle interventions, dietary effects on blood sugar control.  -Counseled on s/sx of and management of hypoglycemia.  -A1c anticipated 03/2024. -Message sent to PCP concerning his shoulder pain.   Written patient instructions provided. Patient verbalized understanding of treatment plan.  Total time in face to face  counseling 30 minutes.    Follow-up:  Me: 4 weeks  Herlene Fleeta Morris, PharmD, [place], CPP Clinical Pharmacist [facility] [phone]

## 2024-01-23 ENCOUNTER — Other Ambulatory Visit (INDEPENDENT_AMBULATORY_CARE_PROVIDER_SITE_OTHER): Payer: Self-pay | Admitting: Primary Care

## 2024-01-23 DIAGNOSIS — M25519 Pain in unspecified shoulder: Secondary | ICD-10-CM

## 2024-02-06 ENCOUNTER — Other Ambulatory Visit: Payer: Self-pay

## 2024-02-06 ENCOUNTER — Ambulatory Visit: Payer: Self-pay | Admitting: Sports Medicine

## 2024-02-06 ENCOUNTER — Encounter: Payer: Self-pay | Admitting: Sports Medicine

## 2024-02-06 DIAGNOSIS — Z794 Long term (current) use of insulin: Secondary | ICD-10-CM

## 2024-02-06 DIAGNOSIS — G8929 Other chronic pain: Secondary | ICD-10-CM

## 2024-02-06 DIAGNOSIS — M25512 Pain in left shoulder: Secondary | ICD-10-CM

## 2024-02-06 DIAGNOSIS — E119 Type 2 diabetes mellitus without complications: Secondary | ICD-10-CM

## 2024-02-06 DIAGNOSIS — M12812 Other specific arthropathies, not elsewhere classified, left shoulder: Secondary | ICD-10-CM

## 2024-02-06 DIAGNOSIS — M19012 Primary osteoarthritis, left shoulder: Secondary | ICD-10-CM

## 2024-02-06 MED ORDER — BUPIVACAINE HCL 0.25 % IJ SOLN
2.0000 mL | INTRAMUSCULAR | Status: AC | PRN
  Administered 2024-02-06: 2 mL via INTRA_ARTICULAR

## 2024-02-06 MED ORDER — LIDOCAINE HCL 1 % IJ SOLN
2.0000 mL | INTRAMUSCULAR | Status: AC | PRN
  Administered 2024-02-06: 2 mL

## 2024-02-06 MED ORDER — METHYLPREDNISOLONE ACETATE 40 MG/ML IJ SUSP
40.0000 mg | INTRAMUSCULAR | Status: AC | PRN
  Administered 2024-02-06: 40 mg via INTRA_ARTICULAR
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
## 2024-02-21 ENCOUNTER — Other Ambulatory Visit: Payer: Self-pay

## 2024-02-21 ENCOUNTER — Ambulatory Visit: Payer: Self-pay | Attending: Family Medicine | Admitting: Pharmacist

## 2024-02-21 ENCOUNTER — Other Ambulatory Visit: Payer: Self-pay | Admitting: Family Medicine

## 2024-02-21 ENCOUNTER — Encounter: Payer: Self-pay | Admitting: Pharmacist

## 2024-02-21 DIAGNOSIS — Z7985 Long-term (current) use of injectable non-insulin antidiabetic drugs: Secondary | ICD-10-CM

## 2024-02-21 DIAGNOSIS — Z794 Long term (current) use of insulin: Secondary | ICD-10-CM

## 2024-02-21 DIAGNOSIS — E1165 Type 2 diabetes mellitus with hyperglycemia: Secondary | ICD-10-CM

## 2024-02-21 DIAGNOSIS — Z7984 Long term (current) use of oral hypoglycemic drugs: Secondary | ICD-10-CM

## 2024-02-21 MED ORDER — TRULICITY 3 MG/0.5ML ~~LOC~~ SOAJ
3.0000 mg | SUBCUTANEOUS | 1 refills | Status: AC
  Filled 2024-02-21: qty 2, 28d supply, fill #0
  Filled 2024-04-03: qty 2, 28d supply, fill #1

## 2024-02-21 NOTE — Progress Notes (Signed)
 "   S:     63 y.o. male who presents for diabetes evaluation, education, and management. PMH is significant for BPH, diabetes mellitus (2011), HLD, and HTN.   Patient was last seen and referred by Rosaline on 01/02/24 and last seen by pharmacist on 01/21/24. A1c in October of this year was 8.3%, down from 12.3% in July.  When I saw him in November, he endorsed medication adherence and his reported blood sugar readings were mostly at goal. I noted better adherence to his Basal + Bolus insulin  regimen, Trulicity , and metformin  based on his dispense record. No medication changes were made at that appointment.  Of note, patient saw Dr. Burnetta at [facility] 02/06/24. 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 Also noted spurring off the greater tuberosity of the humeral head, possibly indicative of impingement/rotator cuff tendinopathy. Ortho proceeded with IA methylprednisolone  and home PT with plans to follow-up in about 6 weeks.   Today, patient arrives in good spirits. Spanish Interpreter James, ID # [id number]) used. Regarding his DM, he is doing a good job with adherence. His refill history is listed below. Of note, he is tolerating the Trulicity  well. Denies any NV, abdominal pain. No changes in vision. Since seeing Dr. Burnetta he notes improvement in his pain and ROM in his L shoulder.  Family/Social History:  - FHx: diabetes (mother, brother, sister) - Tobacco Use: Current -15 pack-year smoking history   Current diabetes medications include:  - Basaglar  48 units daily (last filled 31-day supply 01/21/24) - Humalog  24 units BID (last filled 29-day supply 01/21/24) - Trulicity  3 mg weekly (last filled 28-day supply 01/21/24) - Metformin  1000 mg XR BID - takes two 500 mg tabs BID (last filled 30-day supply 01/21/24)  Insurance coverage: Self-Pay  Patient denies hypoglycemic events.  Reported home blood sugars:  -Since last visit with me, blood  sugars are 120-180 mg/dL throughout the day. Some readings (recalls two) in the 200s  Patient denies polyuria. 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 Patient denies visual changes. Patient reports self foot exams.   Patient reported dietary habits: - Reports cutting back on carbs and tortillas; eats ~ 2 tortillas a day - No changes in diet  - Usually only eats 1 meal daily at lunch   Patient-reported exercise habits:  - Walks 10-20 minutes a [place] at the park - Reports he is still walking daily   O:   Lab Results  Component Value Date   HGBA1C 8.3 (A) 12/10/2023   There were no vitals filed for this visit.  Lipid Panel     Component Value Date/Time   CHOL 124 01/02/2024 0936   TRIG 175 (H) 01/02/2024 0936   HDL 32 (L) 01/02/2024 0936   CHOLHDL 3.9 01/02/2024 0936   CHOLHDL 4.7 03/07/2016 1046   VLDL 61 (H) 03/07/2016 1046   LDLCALC 62 01/02/2024 0936    Clinical Atherosclerotic Cardiovascular Disease (ASCVD): No  The ASCVD Risk score (Arnett DK, et al., 2019) failed to calculate for the following reasons:   The valid total cholesterol range is 130 to 320 mg/dL   Patient is participating in a Managed Medicaid Plan: No   A/P: Diabetes longstanding, currently uncontrolled but improved tremendously. His A1c in October was down from 12.3% to 8.3%. Commended him for this but acknowledged we can still improve. It does seem like his adherence is improving and his reported sugar readings are at goal. He is not hypoglycemic and is able  to verbalize appropriate hypoglycemia management plan.  -Continue Basaglar  48 units at night -Continue Humalog  24 units BID before breakfast and lunch (he does not eat dinner).  -Continue Trulicity  to 3 mg weekly (on Fridays). -Continue metformin  1000 mg XR BID (takes two 500 mg tabs BID) -Patient educated on purpose, proper use, and potential adverse effects of medications.  -Extensively discussed pathophysiology of diabetes,  recommended lifestyle interventions, dietary effects on blood sugar control.  -Counseled on s/sx of and management of hypoglycemia.  -A1c anticipated 03/2024.  Written patient instructions provided. Patient verbalized understanding of treatment plan.  Total time in face to face counseling 30 minutes.    Follow-up:  PCP: 04/03/2024 Me: 04/24/2024  Herlene Fleeta Morris, PharmD, BCACP, CPP Clinical Pharmacist [facility] [phone]  "

## 2024-03-03 ENCOUNTER — Other Ambulatory Visit: Payer: Self-pay

## 2024-03-04 ENCOUNTER — Other Ambulatory Visit: Payer: Self-pay

## 2024-03-09 ENCOUNTER — Other Ambulatory Visit: Payer: Self-pay

## 2024-03-10 ENCOUNTER — Other Ambulatory Visit (INDEPENDENT_AMBULATORY_CARE_PROVIDER_SITE_OTHER): Payer: Self-pay | Admitting: Primary Care

## 2024-03-10 DIAGNOSIS — K0889 Other specified disorders of teeth and supporting structures: Secondary | ICD-10-CM

## 2024-03-10 NOTE — Progress Notes (Unsigned)
 " [facility]  Joseph Roberts, is a 64 y.o. male  RDW:[id number]  FMW:[id number]  DOB - 06/08/1960  No chief complaint on file.      Subjective:   Joseph Roberts is a 64 y.o. male here today for an acute visit. Walk in has a broken tooth left upper jaw with cavities. Sensitive to temperature changes requesting referral to dentist.   HPI  No problems updated.  Comprehensive ROS Pertinent positive and negative noted in HPI   Allergies[1]  Past Medical History:  Diagnosis Date   BPH (benign prostatic hyperplasia)    Diabetes mellitus 2011   GERD (gastroesophageal reflux disease)    High triglycerides    Hypertension     Medications Ordered Prior to Encounter[2] Health Maintenance  Topic Date Due   Zoster (Shingles) Vaccine (1 of 2) Never done   Pneumococcal Vaccine for age over 75 (2 of 2 - PCV) 10/25/2014   Eye exam for diabetics  07/28/2018   Colon Cancer Screening  10/25/2023   COVID-19 Vaccine (3 - 2025-26 season) 11/04/2023   DTaP/Tdap/Td vaccine (1 - Tdap) 04/21/2024*   Flu Shot  06/02/2024*   Yearly kidney health urinalysis for diabetes  04/21/2024   Hemoglobin A1C  06/09/2024   Complete foot exam   10/01/2024   Yearly kidney function blood test for diabetes  01/01/2025   Hepatitis C Screening  Completed   HIV Screening  Completed   Hepatitis B Vaccine  Aged Out   HPV Vaccine  Aged Out   Meningitis B Vaccine  Aged Out  *Topic was postponed. The date shown is not the original due date.    Objective:   There were no vitals filed for this visit. {Vitals History (Optional):23777}  Physical Exam Vitals reviewed.  Constitutional:      Appearance: Normal appearance.  HENT:     Head: Normocephalic.     Right Ear: Tympanic membrane and external ear normal.     Left Ear: External ear normal.     Nose: Nose normal.     Mouth/Throat:     Comments: Poor dentition  Eyes:     Extraocular Movements: Extraocular movements intact.      Pupils: Pupils are equal, round, and reactive to light.  Cardiovascular:     Rate and Rhythm: Normal rate and regular rhythm.  Pulmonary:     Effort: Pulmonary effort is normal.     Breath sounds: Normal breath sounds.  Abdominal:     General: Bowel sounds are normal. There is distension.     Palpations: Abdomen is soft.  Musculoskeletal:        General: Normal range of motion.     Cervical back: Normal range of motion.  Skin:    General: Skin is warm and dry.  Neurological:     Mental Status: He is alert and oriented to person, place, and time.  Psychiatric:        Mood and Affect: Mood normal.        Behavior: Behavior normal.        Thought Content: Thought content normal.        Judgment: Judgment normal.     {Labs (Optional):23779}  Assessment & Plan   Pain, dental     Patient have been counseled extensively about nutrition and exercise. Other issues discussed during this visit include: low cholesterol diet, weight control and daily exercise, foot care, annual eye examinations at Ophthalmology, importance of adherence with medications and regular follow-up. We also  discussed long term complications of uncontrolled diabetes and hypertension.   No follow-ups on file.  The patient was given clear instructions to go to ER or return to medical center if symptoms don't improve, worsen or new problems develop. The patient verbalized understanding. The patient was told to call to get lab results if they haven't heard anything in the next week.   This note has been created with Education officer, environmental. Any transcriptional errors are unintentional.   Rosaline SHAUNNA Bohr, NP 03/10/2024, 1:55 PM    [1] No Known Allergies [2]  Current Outpatient Medications on File Prior to Visit  Medication Sig Dispense Refill   atorvastatin  (LIPITOR) 40 MG tablet Take 1 tablet (40 mg total) by mouth daily. 90 tablet 1   Blood Glucose Monitoring Suppl  (ONE TOUCH ULTRA MINI) W/DEVICE KIT 1 each by Does not apply route daily. Test fasting daily     Dulaglutide  (TRULICITY ) 3 MG/0.5ML SOAJ Inject 3 mg into the skin once a week. 6 mL 1   fexofenadine  (ALLEGRA  ALLERGY) 180 MG tablet Take 1 tablet (180 mg total) by mouth daily. 90 tablet 1   fluticasone  (FLONASE ) 50 MCG/ACT nasal spray Place 2 sprays into both nostrils daily. 16 g 6   gabapentin  (NEURONTIN ) 300 MG capsule Take 1 capsule (300 mg total) by mouth 3 (three) times daily. For the first week only take one pill at night. Then increase to twice a day for a week and then if needed, increase to three times a day. 90 capsule 2   glucose blood (TRUE METRIX BLOOD GLUCOSE TEST) test strip Use as instructed. Check blood glucose level by fingerstick three times per day. 100 each 12   Insulin  Glargine (BASAGLAR  KWIKPEN) 100 UNIT/ML Inject 48 Units into the skin daily. 15 mL 2   insulin  lispro (HUMALOG  KWIKPEN) 100 UNIT/ML KwikPen Inject 24 Units into the skin 3 (three) times daily. 21 mL 6   Insulin  Pen Needle 31G X 5 MM MISC use as directed to inject insulin  4 times daily 200 each 3   Insulin  Syringe-Needle U-100 (TRUEPLUS INSULIN  SYRINGE) 31G X 5/16 0.3 ML MISC Use to inject Humalog  three times daily. 100 each 2   lisinopril  (ZESTRIL ) 10 MG tablet Take 1 tablet (10 mg total) by mouth daily. 90 tablet 1   meloxicam  (MOBIC ) 7.5 MG tablet Take 2 tablets (15 mg total) by mouth daily. 60 tablet 0   metFORMIN  (GLUCOPHAGE -XR) 500 MG 24 hr tablet Take 2 tablets (1,000 mg total) by mouth 2 (two) times daily. 120 tablet 1   pantoprazole  (PROTONIX ) 40 MG tablet 1 CAP(S) QD ORALLY 30 DAY(S) for 30     sildenafil  (VIAGRA ) 50 MG tablet Take 1 tablet (50 mg total) by mouth as needed for erectile dysfunction. 20 tablet 2   TRUEplus Lancets 28G MISC Use as instructed. Check blood glucose level by fingerstick three times per day. 100 each 3   No current facility-administered medications on file prior to visit.   "

## 2024-04-02 ENCOUNTER — Telehealth (INDEPENDENT_AMBULATORY_CARE_PROVIDER_SITE_OTHER): Payer: Self-pay | Admitting: Primary Care

## 2024-04-02 NOTE — Telephone Encounter (Signed)
 Spoke to pt about upcoming appt.. Will be present

## 2024-04-03 ENCOUNTER — Ambulatory Visit (INDEPENDENT_AMBULATORY_CARE_PROVIDER_SITE_OTHER): Payer: Self-pay | Admitting: Primary Care

## 2024-04-03 ENCOUNTER — Other Ambulatory Visit: Payer: Self-pay

## 2024-04-03 ENCOUNTER — Encounter (INDEPENDENT_AMBULATORY_CARE_PROVIDER_SITE_OTHER): Payer: Self-pay | Admitting: Primary Care

## 2024-04-03 VITALS — BP 128/75 | HR 68 | Temp 97.9°F | Resp 16 | Ht 64.0 in | Wt 149.2 lb

## 2024-04-03 DIAGNOSIS — R051 Acute cough: Secondary | ICD-10-CM

## 2024-04-03 DIAGNOSIS — I10 Essential (primary) hypertension: Secondary | ICD-10-CM

## 2024-04-03 DIAGNOSIS — E785 Hyperlipidemia, unspecified: Secondary | ICD-10-CM

## 2024-04-03 DIAGNOSIS — E1165 Type 2 diabetes mellitus with hyperglycemia: Secondary | ICD-10-CM

## 2024-04-03 DIAGNOSIS — Z1211 Encounter for screening for malignant neoplasm of colon: Secondary | ICD-10-CM

## 2024-04-03 MED ORDER — DM-GUAIFENESIN ER 30-600 MG PO TB12
1.0000 | ORAL_TABLET | Freq: Two times a day (BID) | ORAL | 0 refills | Status: AC | PRN
  Filled 2024-04-03: qty 30, 15d supply, fill #0

## 2024-04-03 NOTE — Progress Notes (Unsigned)
 " [facility]  Joseph Roberts, is a 64 y.o. male  RDW:[id number]  FMW:[id number]  DOB - 1960/09/12  Chief Complaint  Patient presents with   Diabetes   Hypertension       Subjective:   Joseph Roberts is a 64 y.o. male here today for a follow up visit. HTN-Patient has No headache, No chest pain, No abdominal pain - No Nausea, No new weakness tingling or numbness, No Cough - shortness of breath. T2D- Denies polyuria, polydipsia, polyphasia or vision changes.  Does not check blood sugars at home.  Patient complains of a cough productive for the last 2 weeks 1 day of fever and having chills and sweating.  No problems updated.  Comprehensive ROS Pertinent positive and negative noted in HPI   Allergies[1]  Past Medical History:  Diagnosis Date   BPH (benign prostatic hyperplasia)    Diabetes mellitus 2011   GERD (gastroesophageal reflux disease)    High triglycerides    Hypertension     Medications Ordered Prior to Encounter[2] Health Maintenance  Topic Date Due   Zoster (Shingles) Vaccine (1 of 2) Never done   Pneumococcal Vaccine for age over 1 (2 of 2 - PCV) 10/25/2014   Eye exam for diabetics  07/28/2018   Colon Cancer Screening  10/25/2023   COVID-19 Vaccine (3 - 2025-26 season) 11/04/2023   Kidney health urinalysis for diabetes  04/21/2024   DTaP/Tdap/Td vaccine (1 - Tdap) 04/21/2024*   Flu Shot  06/02/2024*   Hemoglobin A1C  06/09/2024   Complete foot exam   10/01/2024   Yearly kidney function blood test for diabetes  01/01/2025   HPV Vaccine (No Doses Required) Completed   Hepatitis C Screening  Completed   HIV Screening  Completed   Hepatitis B Vaccine  Aged Out   Meningitis B Vaccine  Aged Out  *Topic was postponed. The date shown is not the original due date.    Objective:   Vitals:   04/03/24 0857  BP: 128/75  Pulse: 68  Resp: 16  Temp: 97.9 F (36.6 C)  SpO2: 100%  Weight: 149 lb 3.2 oz (67.7 kg)  Height: 5' 4  (1.626 m)   BP Readings from Last 3 Encounters:  04/03/24 128/75  01/02/24 132/84  10/02/23 125/81      Physical Exam Vitals reviewed.  Constitutional:      Appearance: Normal appearance. He is normal weight.  HENT:     Head: Normocephalic.     Right Ear: Tympanic membrane, ear canal and external ear normal.     Left Ear: Tympanic membrane, ear canal and external ear normal.     Nose: Nose normal.  Eyes:     Extraocular Movements: Extraocular movements intact.     Pupils: Pupils are equal, round, and reactive to light.  Cardiovascular:     Rate and Rhythm: Normal rate and regular rhythm.  Pulmonary:     Effort: Pulmonary effort is normal.     Breath sounds: Normal breath sounds.  Abdominal:     General: Bowel sounds are normal. There is distension.     Palpations: Abdomen is soft.  Musculoskeletal:        General: Normal range of motion.     Cervical back: Normal range of motion.  Skin:    General: Skin is warm and dry.  Neurological:     Mental Status: He is alert and oriented to person, place, and time.  Psychiatric:        Mood  and Affect: Mood normal.        Behavior: Behavior normal.        Thought Content: Thought content normal.        Judgment: Judgment normal.     Assessment & Plan  [name] was seen today for diabetes and hypertension.  Diagnoses and all orders for this visit:  Type 2 diabetes mellitus with hyperglycemia, with long-term current use of insulin  (HCC) -     CBC with Differential/Platelet; Future -     Hemoglobin A1c; Future -     Microalbumin / creatinine urine ratio; Future  Dyslipidemia -     Lipid panel; Future  Essential hypertension -     CMP14+EGFR; Future  Colon cancer screening -     Fecal occult blood, imunochemical; Future  Acute cough -     dextromethorphan -guaiFENesin  (MUCINEX  DM) 30-600 MG 12hr tablet; Take 1 tablet by mouth 2 (two) times daily as needed for cough.     Patient have been counseled extensively  about nutrition and exercise. Other issues discussed during this visit include: low cholesterol diet, weight control and daily exercise, foot care, annual eye examinations at Ophthalmology, importance of adherence with medications and regular follow-up. We also discussed long term complications of uncontrolled diabetes and hypertension.   Return in about 3 months (around 07/02/2024) for HTN DM , fasting labs.  The patient was given clear instructions to go to ER or return to medical center if symptoms don't improve, worsen or new problems develop. The patient verbalized understanding. The patient was told to call to get lab results if they haven't heard anything in the next week.   This note has been created with Education officer, environmental. Any transcriptional errors are unintentional.   Rosaline SHAUNNA Bohr, NP 04/03/2024, 9:41 AM    [1] No Known Allergies [2]  Current Outpatient Medications on File Prior to Visit  Medication Sig Dispense Refill   atorvastatin  (LIPITOR) 40 MG tablet Take 1 tablet (40 mg total) by mouth daily. 90 tablet 1   Blood Glucose Monitoring Suppl (ONE TOUCH ULTRA MINI) W/DEVICE KIT 1 each by Does not apply route daily. Test fasting daily     Dulaglutide  (TRULICITY ) 3 MG/0.5ML SOAJ Inject 3 mg into the skin once a week. 6 mL 1   fexofenadine  (ALLEGRA  ALLERGY) 180 MG tablet Take 1 tablet (180 mg total) by mouth daily. 90 tablet 1   fluticasone  (FLONASE ) 50 MCG/ACT nasal spray Place 2 sprays into both nostrils daily. 16 g 6   gabapentin  (NEURONTIN ) 300 MG capsule Take 1 capsule (300 mg total) by mouth 3 (three) times daily. For the first week only take one pill at night. Then increase to twice a day for a week and then if needed, increase to three times a day. 90 capsule 2   glucose blood (TRUE METRIX BLOOD GLUCOSE TEST) test strip Use as instructed. Check blood glucose level by fingerstick three times per day. 100 each 12   Insulin   Glargine (BASAGLAR  KWIKPEN) 100 UNIT/ML Inject 48 Units into the skin daily. 15 mL 2   insulin  lispro (HUMALOG  KWIKPEN) 100 UNIT/ML KwikPen Inject 24 Units into the skin 3 (three) times daily. 21 mL 6   Insulin  Pen Needle 31G X 5 MM MISC use as directed to inject insulin  4 times daily 200 each 3   Insulin  Syringe-Needle U-100 (TRUEPLUS INSULIN  SYRINGE) 31G X 5/16 0.3 ML MISC Use to inject Humalog  three times daily. 100  each 2   lisinopril  (ZESTRIL ) 10 MG tablet Take 1 tablet (10 mg total) by mouth daily. 90 tablet 1   meloxicam  (MOBIC ) 7.5 MG tablet Take 2 tablets (15 mg total) by mouth daily. 60 tablet 0   metFORMIN  (GLUCOPHAGE -XR) 500 MG 24 hr tablet Take 2 tablets (1,000 mg total) by mouth 2 (two) times daily. 120 tablet 1   pantoprazole  (PROTONIX ) 40 MG tablet 1 CAP(S) QD ORALLY 30 DAY(S) for 30     sildenafil  (VIAGRA ) 50 MG tablet Take 1 tablet (50 mg total) by mouth as needed for erectile dysfunction. 20 tablet 2   TRUEplus Lancets 28G MISC Use as instructed. Check blood glucose level by fingerstick three times per day. 100 each 3   No current facility-administered medications on file prior to visit.   "

## 2024-04-10 ENCOUNTER — Other Ambulatory Visit: Payer: Self-pay

## 2024-04-10 ENCOUNTER — Ambulatory Visit: Payer: Self-pay

## 2024-04-10 DIAGNOSIS — E1165 Type 2 diabetes mellitus with hyperglycemia: Secondary | ICD-10-CM

## 2024-04-10 DIAGNOSIS — E785 Hyperlipidemia, unspecified: Secondary | ICD-10-CM

## 2024-04-10 DIAGNOSIS — I10 Essential (primary) hypertension: Secondary | ICD-10-CM

## 2024-04-10 DIAGNOSIS — Z1211 Encounter for screening for malignant neoplasm of colon: Secondary | ICD-10-CM

## 2024-04-24 ENCOUNTER — Ambulatory Visit: Payer: Self-pay | Admitting: Pharmacist

## 2024-07-02 ENCOUNTER — Ambulatory Visit (INDEPENDENT_AMBULATORY_CARE_PROVIDER_SITE_OTHER): Payer: Self-pay | Admitting: Primary Care
